# Patient Record
Sex: Female | Born: 1945 | Race: Black or African American | Hispanic: No | Marital: Married | Smoking: Current every day smoker
Health system: Southern US, Community
[De-identification: ages and names within clinical notes are randomized; demographics above are authoritative.]

## PROBLEM LIST (undated history)

## (undated) ENCOUNTER — Emergency Department (HOSPITAL_COMMUNITY): Disposition: A | Discharge status: Home / Self Care | Payer: 59

## (undated) DIAGNOSIS — I10 Essential (primary) hypertension: Secondary | ICD-10-CM

## (undated) DIAGNOSIS — N289 Disorder of kidney and ureter, unspecified: Secondary | ICD-10-CM

## (undated) DIAGNOSIS — R739 Hyperglycemia, unspecified: Secondary | ICD-10-CM

## (undated) DIAGNOSIS — E039 Hypothyroidism, unspecified: Secondary | ICD-10-CM

## (undated) DIAGNOSIS — M4800 Spinal stenosis, site unspecified: Secondary | ICD-10-CM

## (undated) DIAGNOSIS — F419 Anxiety disorder, unspecified: Secondary | ICD-10-CM

## (undated) DIAGNOSIS — K259 Gastric ulcer, unspecified as acute or chronic, without hemorrhage or perforation: Secondary | ICD-10-CM

## (undated) DIAGNOSIS — I639 Cerebral infarction, unspecified: Secondary | ICD-10-CM

## (undated) DIAGNOSIS — K219 Gastro-esophageal reflux disease without esophagitis: Secondary | ICD-10-CM

## (undated) DIAGNOSIS — K279 Peptic ulcer, site unspecified, unspecified as acute or chronic, without hemorrhage or perforation: Secondary | ICD-10-CM

## (undated) DIAGNOSIS — G459 Transient cerebral ischemic attack, unspecified: Secondary | ICD-10-CM

## (undated) DIAGNOSIS — M1990 Unspecified osteoarthritis, unspecified site: Secondary | ICD-10-CM

## (undated) DIAGNOSIS — I2510 Atherosclerotic heart disease of native coronary artery without angina pectoris: Secondary | ICD-10-CM

## (undated) DIAGNOSIS — J449 Chronic obstructive pulmonary disease, unspecified: Secondary | ICD-10-CM

## (undated) DIAGNOSIS — G5600 Carpal tunnel syndrome, unspecified upper limb: Secondary | ICD-10-CM

## (undated) DIAGNOSIS — J189 Pneumonia, unspecified organism: Secondary | ICD-10-CM

## (undated) DIAGNOSIS — E785 Hyperlipidemia, unspecified: Secondary | ICD-10-CM

## (undated) HISTORY — DX: Cerebral infarction, unspecified: I63.9

## (undated) HISTORY — DX: Carpal tunnel syndrome, unspecified upper limb: G56.00

## (undated) HISTORY — PX: CYSTECTOMY: SUR359

## (undated) HISTORY — PX: HEMORRHOID SURGERY: SHX153

## (undated) HISTORY — PX: COLONOSCOPY: SHX174

## (undated) HISTORY — DX: Hyperlipidemia, unspecified: E78.5

## (undated) HISTORY — PX: ABDOMINAL HYSTERECTOMY: SHX81

## (undated) HISTORY — DX: Unspecified osteoarthritis, unspecified site: M19.90

## (undated) HISTORY — DX: Gastric ulcer, unspecified as acute or chronic, without hemorrhage or perforation: K25.9

## (undated) HISTORY — PX: APPENDECTOMY: SHX54

## (undated) HISTORY — PX: CHOLECYSTECTOMY: SHX55

## (undated) HISTORY — DX: Hyperglycemia, unspecified: R73.9

## (undated) HISTORY — DX: Chronic obstructive pulmonary disease, unspecified: J44.9

## (undated) HISTORY — DX: Anxiety disorder, unspecified: F41.9

## (undated) HISTORY — PX: BACK SURGERY: SHX140

## (undated) HISTORY — DX: Peptic ulcer, site unspecified, unspecified as acute or chronic, without hemorrhage or perforation: K27.9

## (undated) HISTORY — DX: Spinal stenosis, site unspecified: M48.00

## (undated) HISTORY — DX: Gastro-esophageal reflux disease without esophagitis: K21.9

## (undated) HISTORY — DX: Disorder of kidney and ureter, unspecified: N28.9

## (undated) HISTORY — DX: Pneumonia, unspecified organism: J18.9

## (undated) HISTORY — DX: Hypothyroidism, unspecified: E03.9

## (undated) HISTORY — PX: JOINT REPLACEMENT: SHX530

---

## 2001-06-07 ENCOUNTER — Emergency Department (HOSPITAL_COMMUNITY): Admission: EM | Admit: 2001-06-07 | Discharge: 2001-06-07 | Payer: Self-pay | Admitting: Internal Medicine

## 2002-02-10 ENCOUNTER — Inpatient Hospital Stay (HOSPITAL_COMMUNITY): Admission: EM | Admit: 2002-02-10 | Discharge: 2002-02-13 | Payer: Self-pay | Admitting: *Deleted

## 2002-02-10 ENCOUNTER — Encounter: Payer: Self-pay | Admitting: Emergency Medicine

## 2002-12-10 ENCOUNTER — Ambulatory Visit (HOSPITAL_COMMUNITY): Admission: RE | Admit: 2002-12-10 | Discharge: 2002-12-10 | Payer: Self-pay | Admitting: Pulmonary Disease

## 2002-12-19 ENCOUNTER — Emergency Department (HOSPITAL_COMMUNITY): Admission: EM | Admit: 2002-12-19 | Discharge: 2002-12-19 | Payer: Self-pay | Admitting: Emergency Medicine

## 2002-12-19 ENCOUNTER — Encounter: Payer: Self-pay | Admitting: Emergency Medicine

## 2002-12-21 ENCOUNTER — Encounter: Payer: Self-pay | Admitting: Emergency Medicine

## 2002-12-21 ENCOUNTER — Emergency Department (HOSPITAL_COMMUNITY): Admission: EM | Admit: 2002-12-21 | Discharge: 2002-12-21 | Payer: Self-pay | Admitting: Emergency Medicine

## 2003-01-05 ENCOUNTER — Ambulatory Visit (HOSPITAL_COMMUNITY): Admission: RE | Admit: 2003-01-05 | Discharge: 2003-01-05 | Payer: Self-pay | Admitting: Internal Medicine

## 2003-01-05 HISTORY — PX: ESOPHAGOGASTRODUODENOSCOPY: SHX1529

## 2004-08-08 ENCOUNTER — Ambulatory Visit: Payer: Self-pay | Admitting: Occupational Therapy

## 2004-10-17 ENCOUNTER — Encounter: Payer: Self-pay | Admitting: Orthopedic Surgery

## 2004-11-16 ENCOUNTER — Encounter: Payer: Self-pay | Admitting: Orthopedic Surgery

## 2004-12-16 ENCOUNTER — Encounter: Payer: Self-pay | Admitting: Orthopedic Surgery

## 2007-07-31 ENCOUNTER — Emergency Department (HOSPITAL_COMMUNITY): Admission: EM | Admit: 2007-07-31 | Discharge: 2007-07-31 | Payer: Self-pay | Admitting: Emergency Medicine

## 2008-03-18 DIAGNOSIS — J189 Pneumonia, unspecified organism: Secondary | ICD-10-CM

## 2008-03-18 DIAGNOSIS — M4800 Spinal stenosis, site unspecified: Secondary | ICD-10-CM

## 2008-03-18 HISTORY — DX: Pneumonia, unspecified organism: J18.9

## 2008-03-18 HISTORY — DX: Spinal stenosis, site unspecified: M48.00

## 2008-10-23 ENCOUNTER — Emergency Department (HOSPITAL_COMMUNITY): Admission: EM | Admit: 2008-10-23 | Discharge: 2008-10-24 | Payer: Self-pay | Admitting: Emergency Medicine

## 2008-10-25 ENCOUNTER — Inpatient Hospital Stay (HOSPITAL_COMMUNITY): Admission: EM | Admit: 2008-10-25 | Discharge: 2008-10-31 | Payer: Self-pay | Admitting: Emergency Medicine

## 2008-10-27 ENCOUNTER — Ambulatory Visit: Payer: Self-pay | Admitting: Internal Medicine

## 2008-10-29 ENCOUNTER — Ambulatory Visit: Payer: Self-pay | Admitting: Internal Medicine

## 2008-11-11 ENCOUNTER — Encounter: Payer: Self-pay | Admitting: Internal Medicine

## 2009-03-22 ENCOUNTER — Emergency Department (HOSPITAL_COMMUNITY): Admission: EM | Admit: 2009-03-22 | Discharge: 2009-03-22 | Payer: Self-pay | Admitting: Emergency Medicine

## 2010-06-23 LAB — POCT CARDIAC MARKERS
CKMB, poc: 1.3 ng/mL (ref 1.0–8.0)
CKMB, poc: 1.4 ng/mL (ref 1.0–8.0)
Myoglobin, poc: 199 ng/mL (ref 12–200)

## 2010-06-23 LAB — DIFFERENTIAL
Basophils Absolute: 0 10*3/uL (ref 0.0–0.1)
Basophils Absolute: 0 10*3/uL (ref 0.0–0.1)
Basophils Relative: 1 % (ref 0–1)
Basophils Relative: 1 % (ref 0–1)
Basophils Relative: 1 % (ref 0–1)
Basophils Relative: 1 % (ref 0–1)
Eosinophils Absolute: 0.1 10*3/uL (ref 0.0–0.7)
Eosinophils Absolute: 0.1 10*3/uL (ref 0.0–0.7)
Eosinophils Absolute: 0.2 10*3/uL (ref 0.0–0.7)
Eosinophils Relative: 1 % (ref 0–5)
Eosinophils Relative: 3 % (ref 0–5)
Eosinophils Relative: 4 % (ref 0–5)
Lymphocytes Relative: 31 % (ref 12–46)
Lymphs Abs: 1.5 10*3/uL (ref 0.7–4.0)
Lymphs Abs: 1.7 10*3/uL (ref 0.7–4.0)
Lymphs Abs: 1.8 10*3/uL (ref 0.7–4.0)
Monocytes Absolute: 0.4 10*3/uL (ref 0.1–1.0)
Monocytes Absolute: 0.5 10*3/uL (ref 0.1–1.0)
Monocytes Relative: 11 % (ref 3–12)
Monocytes Relative: 11 % (ref 3–12)
Monocytes Relative: 7 % (ref 3–12)
Neutro Abs: 2.3 10*3/uL (ref 1.7–7.7)
Neutro Abs: 2.5 10*3/uL (ref 1.7–7.7)
Neutro Abs: 3.2 10*3/uL (ref 1.7–7.7)
Neutrophils Relative %: 46 % (ref 43–77)
Neutrophils Relative %: 47 % (ref 43–77)
Neutrophils Relative %: 65 % (ref 43–77)

## 2010-06-23 LAB — COMPREHENSIVE METABOLIC PANEL
ALT: 13 U/L (ref 0–35)
AST: 15 U/L (ref 0–37)
Albumin: 2.8 g/dL — ABNORMAL LOW (ref 3.5–5.2)
Alkaline Phosphatase: 64 U/L (ref 39–117)
Alkaline Phosphatase: 65 U/L (ref 39–117)
BUN: 11 mg/dL (ref 6–23)
BUN: 2 mg/dL — ABNORMAL LOW (ref 6–23)
CO2: 34 mEq/L — ABNORMAL HIGH (ref 19–32)
Calcium: 9.3 mg/dL (ref 8.4–10.5)
Chloride: 101 mEq/L (ref 96–112)
Chloride: 102 mEq/L (ref 96–112)
Creatinine, Ser: 1.17 mg/dL (ref 0.4–1.2)
GFR calc Af Amer: >60 mL/min (ref >60)
GFR calc non Af Amer: 46 mL/min — ABNORMAL LOW (ref >60)
GFR calc non Af Amer: 51 mL/min — ABNORMAL LOW (ref >60)
Glucose, Bld: 93 mg/dL (ref 70–99)
Glucose, Bld: 99 mg/dL (ref 70–99)
Potassium: 3.1 mEq/L — ABNORMAL LOW (ref 3.5–5.1)
Potassium: 3.3 mEq/L — ABNORMAL LOW (ref 3.5–5.1)
Sodium: 145 mEq/L (ref 135–145)
Sodium: 146 mEq/L — ABNORMAL HIGH (ref 135–145)
Total Bilirubin: 0.5 mg/dL (ref 0.3–1.2)
Total Protein: 5.7 g/dL — ABNORMAL LOW (ref 6.0–8.3)

## 2010-06-23 LAB — PHOSPHORUS: Phosphorus: 3.5 mg/dL (ref 2.3–4.6)

## 2010-06-23 LAB — CBC
HCT: 34.7 % — ABNORMAL LOW (ref 36.0–46.0)
HCT: 37 % (ref 36.0–46.0)
HCT: 38.1 % (ref 36.0–46.0)
HCT: 38.5 % (ref 36.0–46.0)
Hemoglobin: 12.1 g/dL (ref 12.0–15.0)
Hemoglobin: 13.4 g/dL (ref 12.0–15.0)
Hemoglobin: 13.6 g/dL (ref 12.0–15.0)
MCHC: 34.8 g/dL (ref 30.0–36.0)
MCHC: 34.9 g/dL (ref 30.0–36.0)
MCV: 86.7 fL (ref 78.0–100.0)
MCV: 86.8 fL (ref 78.0–100.0)
MCV: 87.4 fL (ref 78.0–100.0)
MCV: 87.4 fL (ref 78.0–100.0)
MCV: 87.9 fL (ref 78.0–100.0)
Platelets: 149 10*3/uL — ABNORMAL LOW (ref 150–400)
Platelets: 153 10*3/uL (ref 150–400)
Platelets: 170 10*3/uL (ref 150–400)
RBC: 3.95 MIL/uL (ref 3.87–5.11)
RBC: 4.1 MIL/uL (ref 3.87–5.11)
RBC: 4.26 MIL/uL (ref 3.87–5.11)
RBC: 4.36 MIL/uL (ref 3.87–5.11)
RDW: 13 % (ref 11.5–15.5)
WBC: 4.1 10*3/uL (ref 4.0–10.5)
WBC: 4.7 10*3/uL (ref 4.0–10.5)
WBC: 4.9 10*3/uL (ref 4.0–10.5)
WBC: 5.3 10*3/uL (ref 4.0–10.5)

## 2010-06-23 LAB — BASIC METABOLIC PANEL
BUN: 11 mg/dL (ref 6–23)
BUN: 12 mg/dL (ref 6–23)
BUN: 4 mg/dL — ABNORMAL LOW (ref 6–23)
BUN: 4 mg/dL — ABNORMAL LOW (ref 6–23)
CO2: 32 mEq/L (ref 19–32)
CO2: 32 mEq/L (ref 19–32)
Calcium: 8.7 mg/dL (ref 8.4–10.5)
Chloride: 104 mEq/L (ref 96–112)
Chloride: 101 mEq/L (ref 96–112)
Chloride: 105 mEq/L (ref 96–112)
Chloride: 106 mEq/L (ref 96–112)
Chloride: 107 mEq/L (ref 96–112)
Creatinine, Ser: 1.11 mg/dL (ref 0.4–1.2)
GFR calc Af Amer: 57 mL/min — ABNORMAL LOW (ref >60)
GFR calc Af Amer: 57 mL/min — ABNORMAL LOW (ref >60)
GFR calc Af Amer: 60 mL/min — ABNORMAL LOW (ref >60)
GFR calc Af Amer: >60 mL/min (ref >60)
GFR calc non Af Amer: 54 mL/min — ABNORMAL LOW (ref >60)
GFR calc non Af Amer: 55 mL/min — ABNORMAL LOW (ref >60)
Potassium: 4.8 mEq/L (ref 3.5–5.1)
Potassium: 2.9 mEq/L — ABNORMAL LOW (ref 3.5–5.1)
Potassium: 3.1 mEq/L — ABNORMAL LOW (ref 3.5–5.1)
Potassium: 3.4 mEq/L — ABNORMAL LOW (ref 3.5–5.1)
Sodium: 137 mEq/L (ref 135–145)
Sodium: 140 mEq/L (ref 135–145)

## 2010-06-23 LAB — LIPASE, BLOOD: Lipase: <10 U/L — ABNORMAL LOW (ref 11–59)

## 2010-06-23 LAB — URINALYSIS, ROUTINE W REFLEX MICROSCOPIC
Glucose, UA: NEGATIVE mg/dL
Glucose, UA: NEGATIVE mg/dL
Hgb urine dipstick: NEGATIVE
Ketones, ur: NEGATIVE mg/dL
Nitrite: NEGATIVE
Specific Gravity, Urine: <1.005 — ABNORMAL LOW (ref 1.005–1.030)
pH: 5.5 (ref 5.0–8.0)
pH: 6 (ref 5.0–8.0)

## 2010-06-23 LAB — TSH
TSH: 0.792 u[IU]/mL (ref 0.350–4.500)
TSH: 2.358 u[IU]/mL (ref 0.350–4.500)

## 2010-06-23 LAB — MAGNESIUM
Magnesium: 1.6 mg/dL (ref 1.5–2.5)
Magnesium: 1.7 mg/dL (ref 1.5–2.5)
Magnesium: 1.8 mg/dL (ref 1.5–2.5)

## 2010-06-23 LAB — T4, FREE: Free T4: 0.84 ng/dL (ref 0.80–1.80)

## 2010-07-31 NOTE — Consult Note (Signed)
NAME:  Connie Ponce, Connie Ponce               ACCOUNT NO.:  [id number]   MEDICAL RECORD NO.:  [id number]          PATIENT TYPE:  INP   LOCATION:  A210                          FACILITY:  [facility]   PHYSICIAN:  R. Roetta Sessions, M.D. DATE OF BIRTH:  [birth year]   DATE OF CONSULTATION:  DATE OF DISCHARGE:                                 CONSULTATION   PHYSICIAN REQUESTING CONSULTATION:  Osvaldo Shipper, M.D., [facility]  [facility].   REASON FOR CONSULTATION:  Abdominal pain, vomiting.   HISTORY OF PRESENT ILLNESS:  [name] is a 65 year old African American  female who presented initially on October 24, 2008, to the ER with  complaints of left-sided abdominal pain.  At that point, she did have a  24-hour period of time with intermittent left flank pain.  It radiated  down into the left lower quadrant.  She underwent a CT of the abdomen  and pelvis for this as well as her nausea and vomiting done without IV  contrast.  There was no evidence of renal stones.  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  Urinalysis was normal except  for moderate bilirubin.  CBC was normal; however, her potassium was 2.9.  She was treated and released.  Because of persisting pain, she came back  to the emergency department the following day for ongoing left-sided  abdominal pain and vomiting.  She states she has never had any pain like  this before.  It has been going on now for about 5 days.  She has  vomiting associated with pain.  She denies any associated diarrhea.  She  was given some MiraLax and Dulcolax yesterday evening.  Has had multiple  stools today, but the pain continues.  She had a CT of the abdomen and  pelvis with contrast on the 10th of August when she presented for the  second time with this abdominal pain.  This showed questionable mild  fatty liver.  She had some fecalization of the small bowel loops in the  pelvis of questionable significance, felt likely related to stasis but  developing partial small bowel obstruction not excluded.  She had some  minimally prominent linear markings at the right lung base within the  right lower lobe, which was likely atelectasis but could not exclude  developing pneumonia.  She subsequently had a followup abdominal film  yesterday that showed no evidence of obstruction and no evidence of  significant fecal load.  Her labs remain unremarkable.  Her TSH and T4  are unremarkable.  Lipase and amylase normal.  CBC unremarkable.  LFTs  normal except albumin of 2.8.   She is a little bit drowsy from pain medicine just given to her.  She  denies any chronic abdominal pain or chronic constipation.  She denies  any problems with heartburn, dysphagia, odynophagia, weight loss, melena  or rectal bleeding.  She states she had a colonoscopy about 5 years ago  at [facility].  It was normal.  She states she has a history of  ulcers.  The only EGD I can see in  the EMR is from October 2004, and she  had  some tiny antral erosions, a small hiatal hernia only.   PAST MEDICAL HISTORY:  Hypertension, history of gastric ulcer,  dyslipidemia, chronic back pain with prior back surgery.  She has had a  hysterectomy, a cholecystectomy.  She had a cyst in her rectum, status  post surgery ?, dyslipidemia, hypothyroidism.  EGD as outlined above.  History of colonoscopy as outlined above.   FAMILY HISTORY:  No known family history of colorectal cancer, liver  disease or chronic GI illnesses.  Mother lived to be over 23.   SOCIAL HISTORY:  She has been married for 25 years.  She has 2 daughters  and 1 son.  She smokes 1/2 pack of cigarettes daily for over 40 years.  Denies alcohol use.   ALLERGIES:  NO KNOWN DRUG ALLERGIES.   REVIEW OF SYSTEMS:  See HPI for GI and constitutional.  CARDIOPULMONARY:  Denies chest pain, shortness of breath.  GENITOURINARY:  Denies dysuria,  hematuria.   PHYSICAL EXAMINATION:  VITAL SIGNS:  Temperature 98.8, pulse  72,  respirations 20, blood pressure 197/94, O2 sat 98% on 2 L per minute  nasal cannula.  She has not been able to keep any liquids down today.  She states she has had a couple of stools in the last 24 hours.  GENERAL:  Pleasant __________ female who appears uncomfortable.  She  appears younger than her stated age.  She is obese.  She falls asleep  quickly during evaluation.  Recent pain medication given.  SKIN:  Warm and dry.  No jaundice.  HEENT:  Sclerae are nonicteric.  Oropharyngeal mucosa moist and pink.  No lymphadenopathy.  CHEST:  Lungs clear to auscultation.  CARDIAC:  Exam reveals regular rate and rhythm.  No murmurs.  ABDOMEN:  Obese.  Positive bowel sounds.  The abdomen is soft.  No  guarding or rebound.  Nontender on exam currently given her pain recent  pain medication.  No organomegaly or masses appreciated.  No abdominal  bruits or hernias.  EXTREMITIES:  Lower extremities with no edema.   Labs as mentioned above.  X-rays as mentioned above.   IMPRESSION:  [name] is a 65 year old lady who presents with acute-  onset left-sided abdominal pain associated with nausea, vomiting.  Symptoms have been present now for about 5 days.  CT x2 and plain  abdominal films really unremarkable as far as a source for her symptoms.  Labs unremarkable as well.  She really denies any significant  constipation preceding these symptoms and has had no relief of her  symptoms after multiple BMs in the last 24 hours.  Dr. Jena Gauss has  reviewed the films with Dr. Lynwood Dawley, the radiologist, today.  Her  symptoms are not classical for peptic ulcer disease, IBS.  Really does  not fit a small bowel obstruction at this point.  Would question the  possibility of a urological process such as Dietl's crisis from  intermittent ureteral obstruction as the source of her flank pain and  vomiting.   PLAN:  1. Dr. Jena Gauss to evaluate the patient and make further recommendations.      She may need to have  a renal consult.  2. Supportive measures.  Would advise Zofran 4 mg IV every 4 hours for      the next 24 hours and then to use on a      p.r.n. basis.  Consider increasing IV fluids if she is not  able to      keep any p.o. down at this point.  3. PPI therapy as you are.  4. Further recommendations to follow.      Tana Coast, P.AJonathon Bellows, M.D.  Electronically Signed    LL/MEDQ  D:  10/27/2008  T:  10/27/2008  Job:  [id number]   cc:   Ninfa Linden, NP  [facility]   Osvaldo Shipper, MD

## 2010-07-31 NOTE — Discharge Summary (Signed)
NAMEASTIN, RAPE               ACCOUNT NO.:  [id number]   MEDICAL RECORD NO.:  [id number]          PATIENT TYPE:  INP   LOCATION:  A210                          FACILITY:  [facility]   PHYSICIAN:  Osvaldo Shipper, MD     DATE OF BIRTH:  [birth year]   DATE OF ADMISSION:  10/25/2008  DATE OF DISCHARGE:  08/16/2010LH                               DISCHARGE SUMMARY   PRIMARY CARE PHYSICIAN:  Ninfa Linden, FNP, [place], [place].   CONSULTATIONS:  During this hospitalization the patient was seen by the  following consultants:  1. Gastroenterology.  2. Urology, Dr. Jerre Simon.  3. General surgeon, Dr Franky Macho.   PROCEDURES:  None.   IMAGING STUDIES:  1. CT scan of the abdomen and pelvis which revealed fatty infiltration      of the liver and some fecalization of the small bowel loops,      otherwise unremarkable.  2. Chest x-ray showed questionable nodule in the medial right upper      lung new versus bone density.  3. Abdominal film showed benign bowel gas pattern.  4. Ultrasound of kidneys was negative.  5. MRI of the L-spine showed (1) moderate central canal stenosis at L3-      4. (2) Moderate to severe bilateral foraminal narrowing at L3-4,      left worse than right, status post laminectomy at L4.  Mild right      foraminal narrowing at L4-.  (3) Mild residual central canal      narrowing at L4-5, leftward disk bulging at L5-S1 without      significant stenosis and mild bilateral foraminal narrowing and      left lateral recess narrowing at L2-3.   DISCHARGE DIAGNOSES:  1. Back and abdominal pain thought to be secondary to lumbar      radiculopathy, significantly improved.  2. Pneumonia status post treatment, 2 more days left actually.  3. Hypokalemia, improved.  4. History of hypertension and hypothyroidism, stable.   BRIEF HOSPITAL COURSE:  Briefly, this is a 65 year old African American  female who presented to the hospital with complaints of left flank  pain.  The patient underwent a CT scan with and without contrast which did not  reveal any kidney stones.  Because the patient is having significant  discomfort, we consulted GI.  GI felt that there was no  gastroenterological reason for her abdominal discomfort.  However, they  did recommend a good bowel regimen because of constipation.  Because the  patient's pain was not improving and it was left in the left flank,  initially we consulted urology and Dr. Jerre Simon saw the patient and he  also felt that this was not urological.  In the meantime, the patient's  pain migrated to the lower back and to the abdomen.  The patient was  having significant discomfort and was having nausea, vomiting, along  with this pain.  Once the pain started moving, I also consulted Dr.  Lovell Sheehan, and he also felt this was not a surgical issue.  Finally,  because the patient has chronic back pain we  obtained an MRI, the  results of which were discussed above.  We feel that this pain could be  radiculopathy from her significant lumbar disk disease.  We started her  on Neurontin and Ultram, and her pain has significantly improved as of  today.  She has been able to ambulate with no difficulties.  She is  tolerating p.o. intake.  Nausea and vomiting have subsided, so I think  she is at this time stable to go back home.  I have asked her to discuss  further management with the PMD.  If her pain does not improve, she will  probably need to be seen by a back surgeon.   I have also told her to avoid constipation, and I put her on MiraLax 17  grams daily while she is on these narcotic agents.   On the day of discharge, the patient is feeling well, much improved,  still requiring pain medicines occasionally but is able to ambulate, is  not throwing up, is tolerating p.o. intake, is keen on going home.  Vital signs show that her blood pressure is 136/75.  The rest of the  vital signs are all stable.  Potassium level this  morning is 4.82.  The  rest of the labs are unremarkable.   She is okay for discharge.   DISCHARGE MEDICATIONS:  1. Gabapentin 300 mg b.i.d.  2. Ultram 50 mg t.i.d. for 7 days and then as needed.  3. Avelox 400 mg once daily for 2 more days.  4. Prednisone 60 mg daily for 3 days followed by 40 mg daily for 3      days followed by 20 mg daily for 3 days followed by 10 mg daily for      3 days.  5. MiraLax 17 grams once daily.  Hold if diarrhea.   Otherwise she may continue with her regular home medicines which  include:  1. Clonazepam 0.5 mg b.i.d.  2. Vitamin D 5000 units as before.  3. Metoprolol 25 mg daily.  4. Lisinopril 40 mg daily.  5. Trazodone 150 mg at bedtime.  6. Levoxyl 200 mcg daily.  7. Triamterene-HCTZ 75/50 once daily.  8. Simvastatin 80 mg daily.  9. Omeprazole 40 mg daily.  10.Diltiazem CD 360 mg daily.  11.Oxycodone/acetaminophen t.i.d. as needed.  Please hold this while      taking other pain medications.  12.Etodolac 400 mg daily.   FOLLOW UP:  Follow up with Ninfa Linden, FNP in 7-10 days.   DIET:  Heart-healthy.   PHYSICAL ACTIVITY:  As tolerated.   Total time of this encounter 35 minutes.      Osvaldo Shipper, MD  Electronically Signed     GK/MEDQ  D:  10/31/2008  T:  10/31/2008  Job:  [id number]   cc:   Ninfa Linden, FNP

## 2010-07-31 NOTE — H&P (Signed)
Connie Ponce, Connie Ponce               ACCOUNT NO.:  [id number]   MEDICAL RECORD NO.:  [id number]          PATIENT TYPE:  INP   LOCATION:  A326                          FACILITY:  [facility]   PHYSICIAN:  Margaretmary Dys, M.D.DATE OF BIRTH:  07-25-1945   DATE OF ADMISSION:  10/25/2008  DATE OF DISCHARGE:  LH                              HISTORY & PHYSICAL   ADMISSION DIAGNOSES:  1. Acute left flank abdominal pain.  2. Probable early right lobar pneumonia.  3. Irritable bowel syndrome.  4. Morbid obesity.  5. Moderate dehydration.   CHIEF COMPLAINTS:  Left flank abdominal pain.   HISTORY OF PRESENT ILLNESS:  Connie Ponce is a 65 year old female who  presented to the emergency room complaining of some pain in her left  flank.  The patient has had a negative CT scan done about a week ago,  but returns for persistent pain.  The patient had a CT scan today which  was essentially negative but did show some evidence of early lobar  pneumonia.  The patient reports the pain as 10/10 at its worst.  It  radiates from the left flank down into her lower quadrant.  The  patient's pain is not aggravated by anything and does not have any  precipitating factors.  She did have some nausea and had an episode of  vomiting yesterday.  She denies any fever or chills.  No cough other  than her baseline cough from her COPD and no diarrhea.  The patient  reports her last bowel movement was Friday last week.  The patient  apparently had been evaluated in the past with multiple endoscopies done  which only suggest the possibility of irritable bowel syndrome for which  she was told.  The patient's exam today was really unremarkable with no  significant abnormalities.  The patient has had 2 CT scans in the last 3  days of her abdomen and pelvis which did not reveal any acute  abnormalities.  The patient has had colonoscopies in the past as  mentioned above.   REVIEW OF SYSTEMS:  As mentioned in history of present  illness above.   PAST MEDICAL HISTORY:  1. Hypertension.  2. History of gastric ulcer.  3. Dyslipidemia.  4. Status post cholecystectomy.  5. Hysterectomy.  6. Back surgery.  7. History of cyst in the rectum, status post surgery.  8. History of dyslipidemia.  9. Hypothyroidism.   MEDICATIONS:  1. Clonazepam 0.5 mg p.o. b.i.d.  2. Vitamin B 50,000 units specialized dosing.  3. Metoprolol 25 mg p.o. once a day.  4. Lisinopril 40 mg p.o. daily.  5. Trazodone 150 mg p.o. at bedtime.  6. Levoxyl 200 mcg p.o. once a day.  7. Triamterene hydrochlorothiazide 75/50 one p.o. daily.  8. Simvastatin 80 mg p.o. daily.  9. Diltiazem 360 mg p.o. once a day.  10.Oxycodone/acetaminophen 5/325 mg p.o. t.i.d.  11.Etodolac 400 mg p.o. once a day.   ALLERGIES:  No known drug allergies.   FAMILY HISTORY:  Noncontributory.   SOCIAL HISTORY:  The patient is single, lives at home.  Has 4 children.  She continues to smoke about 1 pack of cigarettes a day.  She is on  disability due to chronic back problems.  She denies any alcohol or  illicit drug use.   PHYSICAL EXAMINATION:  GENERAL:  The patient was conscious, alert,  comfortable, not in acute distress, well oriented to time, place, and  person.  Does not appear to be in any significant pain distress.  VITAL SIGNS:  Blood pressure 175/80, pulse of 71, respiratory rate 24,  temperature 98.1 degrees Fahrenheit, oxygen saturation was 94% on 2 L.  HEENT:  Normocephalic, atraumatic.  Oral mucosa was dry.  No exudates  were noted.  NECK:  Supple.  No jugular venous distention.  LUNGS:  Occasional wheezing with crackles mostly in the right base.  HEART:  Regular rate and rhythm.  No gallops or rubs.  ABDOMEN:  Soft, nontender.  Bowel sounds positive.  No masses palpable.  EXTREMITIES:  No edema.  No calf induration or tenderness was noted.  CNS:  Grossly intact with no focal neurologic deficits.   LABORATORY/DIAGNOSTIC DATA:  White blood cell  count was 5.3, hemoglobin  of 13.6, hematocrit 38.5, platelet count was 161 with no left shift.  Sodium is 139, potassium is 3.3, chloride of 101, CO2 was 31, glucose  93, BUN of 11, creatinine was 1.1, AST is 20, ALT of 18.  Urinalysis was  really unremarkable.  Initial cardiac markers were negative.  Chest x-  ray shows nodule in the right upper lung field versus bony density.  I  have a CT scan of the abdomen and pelvis that shows no acute abnormality  and also likely more stasis, possible partial small bowel obstruction.  Also questionable infiltrate in the right lung.   ASSESSMENT AND PLAN:  1. Connie Ponce is a 65 year old female who presents with left flank pain      whose blood work really is unremarkable.  Her urinalysis is also      unremarkable with no evidence of blood or any other abnormalities      to suggest kidney stones or urinary tract infection or      pyelonephritis.  The patient may have a recurrence of her irritable      bowel syndrome.   PLAN:  Admit the patient to the medical floor.  1. Will control pain with Dilaudid as needed.  2. Will rehydrate the patient with IV Fleet's normal saline.  Will      correct her potassium by adding potassium to the fluids.  3. Will place the patient on a clear liquid diet for now.  4. Will put on Avelox 400 mg IV once a day.  5. The patient will be on albuterol and Atrovent nebulizers.  6. Resume all her home medications at this time.  7. Abdominal pain.  Will request gastroenterology to see her as she      may require a colonoscopy.  The patient does not remember her last      colonoscopy.  8. Overall the patient is stable at this time.  The patient does not      have an acute abdominal pathology that required immediate surgical      exploration.  Will also put empirically on antibiotics as above.  I      explained the above plan to the patient in detail who verbalizes      full understanding.  She is a full code.       Margaretmary Dys, M.D.  Electronically Signed  AM/MEDQ  D:  10/25/2008  T:  10/26/2008  Job:  [id number]

## 2010-08-03 NOTE — H&P (Signed)
NAME:  Connie Ponce, Connie Ponce                         ACCOUNT NO.:  [id number]   MEDICAL RECORD NO.:  [id number]                   PATIENT TYPE:   LOCATION:                                       FACILITY:  [facility]   PHYSICIAN:  R. Roetta Sessions, M.D.              DATE OF BIRTH:  03-11-1946   DATE OF ADMISSION:  DATE OF DISCHARGE:                                HISTORY & PHYSICAL   CHIEF COMPLAINT:  Right upper quadrant pain x1 month.   HISTORY OF PRESENT ILLNESS:  [name] is a 65 year old overweight  African American female who presents complaining of constant hard right  upper quadrant pain that radiates into her back. She was evaluated by the  emergency room physician on December 22, 2002, for the same problem. At that  point in time, CT scan of the abdomen with contrast was performed which was  unremarkable. She also had laboratory studies obtained with lipase 33 and  amylase 62. LFTs were all within normal limits, as well as metabolic panel,  and CBC. She did have an urinalysis obtained which showed many squamous  epithelium, 7-10 WBCs, 0-2 RBCs, and few bacteria. She denies being treated  for an urinary tract infection, and this possibly could have been a  contaminated specimen. She had an acute abdominal series obtained as well on  October 3, which did not reveal any acute abnormalities. She states that the  nausea and vomiting have resolved; however, she continues to have diarrhea  approximately 3-4 times a day. She denies any melena or hematochezia.   She denies any NSAID use or aspirin use; however, she is taking Percocet 2-3  a day for her pain which is 10/10 on the pain scale. Percocet resolved the  pain to 7/10. She notices the pain increases with consumption of food. She  is taking methadone for chronic pain since she had back surgery in 1995,  which disabled her. She does have history of peptic ulcer disease with upper  GI bleed approximately 4-5 years ago, and she was  hospitalized at [facility]. The patient is also status post cholecystectomy three years ago.   CURRENT MEDICATIONS:  1. Prevacid 30 mg daily.  2. Roxicet 5/325 mg as needed for severe pain.  3. Methadone 10 mg p.r.n. pain.  4. Lipitor 40 mg daily.  5. Levoxyl 200 mcg daily.  6. Kay Ciel 20 mEq b.i.d.  7. Triamterene hydrochlorothiazide 75/50 mg daily.  8. Aggrenox b.i.d.  9. Zyrtec 10 mg daily.  10.      Flexeril 10 mg p.r.n.  11.      Ambien 10 mg at bedtime p.r.n. insomnia.   PAST MEDICAL HISTORY:  1. Chronic back pain.  2. Hypertension.  3. Hypercholesterolemia.  4. Hypothyroidism.   PAST SURGICAL HISTORY:  1. Back surgery in 1995.  2. Hysterectomy.  3. Cholecystectomy three years ago.  4. Rectal cyst  I&D twice.   ALLERGIES:  No known drug allergies.   FAMILY HISTORY:  No known family history of colorectal carcinoma, liver, or  chronic GI problems. Mother is alive at age 58 in good health. She does not  have father's history. She has two sisters and one brother all of which were  healthy.   SOCIAL HISTORY:  She has been married x21 years. She has two daughters and  one son who are all healthy except for diabetes mellitus. She is currently  disabled secondary to her chronic back problems. She currently smokes 1/2-1  pack per day for the past 40 years. She denies any alcohol or drug use.   REVIEW OF SYSTEMS:  CONSTITUTIONAL:  She reports her weight is down  approximately six pounds in the past month. She also reports decreased  appetite. CARDIOVASCULAR:  She denies any chest pain or palpitations.  PULMONARY:  She denies any shortness of breath or dyspnea or cough. SKIN:  She denies any rash or jaundice. GENITOURINARY:  She denies any dysuria,  hematuria, or increased urinary frequency. GASTROINTESTINAL:  See HPI.   PHYSICAL EXAMINATION:  VITAL SIGNS:  Weight 222.5 pounds, height 64 inches,  temperature 97.8, blood pressure 128/70, pulse 70.  GENERAL:  Ms.  Connie Ponce is a 65 year old obese African American female  in no acute distress. She is pleasant and cooperative, alert and oriented.  HEENT:  Sclerae clear, nonicteric. Conjunctivae pink.  SKIN:  Brown, warm, and dry without any rash or jaundice.  NECK:  Supple without mass or thyromegaly. No JVD.  CHEST:  Heart regular rate and rhythm without murmurs, clicks, rubs, or  gallops.  LUNGS:  With emphysematous changes throughout. Otherwise, good respirations.  ABDOMEN:  Rounded, nondistended, positive bowel sounds x4. There is mild to  moderate right upper quadrant tenderness on palpation. No palpable masses or  organomegaly; however, this is limited due to patient's body habitus.  EXTREMITIES:  No pedal edema.   LABORATORY DATA:  See HPI.   ASSESSMENT:  1. [name] is a 65 year old African American female with     persistent right upper quadrant pain. I do not see any clinical evidence     of pancreatitis given the previous workup per the emergency room. She     does have history of peptic ulcer disease with gastrointestinal bleed     approximately 4-5 years ago. I do feel peptic ulcer disease is definitely     a possibility of what could be causing her pain. Last     esophagogastroduodenoscopy was by Dr. Jena Gauss in 2000, which revealed a     small submucosal extrinsic compression of the proximal esophagus of     doubtful significant and, otherwise, normal. She also underwent normal     colonoscopy in 2000, except for internal hemorrhoids.  I would like to     repeat the esophagogastroduodenoscopy by Dr. Jena Gauss as soon as possible to     further evaluate this. Another possibility is sphincter of Oddi     dysfunction as she is status post cholecystectomy.  If     esophagogastroduodenoscopy is negative, we may proceed with biliary     manometry per Dr. Luvenia Starch recommendation. 2. Abnormal urinalysis per emergency room record:  Urinalysis was positive     for urinary tract  infection; however, I do not know if this is a clean     specimen. I will repeat urinalysis today to rule out urinary tract     infection.  RECOMMENDATIONS:  1. Urinalysis with urine culture, if needed.  2. She is to quit smoking.  3. Will schedule EGD with Dr. Jena Gauss. She is to stop her Aggrenox three days     prior to the EGD. I discussed this procedure with Connie Ponce with the     risks and benefits to include but not limited to bleeding or infection.     She agreed with this plan. Consent will be obtained.  4. She should continue her current pain medications.  5. She should go immediately to the ER if she develops severe pain.  6. We will follow up pending endoscopy results.     _____________________________________  ___________________________________________  Nicholas Lose, N.P.               Jonathon Bellows, M.D.   KC/MEDQ  D:  12/29/2002  T:  12/29/2002  Job:  [id number]

## 2010-08-03 NOTE — Op Note (Signed)
NAME:  Connie Ponce, Connie Ponce                         ACCOUNT NO.:  [id number]   MEDICAL RECORD NO.:  [id number]                   PATIENT TYPE:  AMB   LOCATION:  DAY                                  FACILITY:  [facility]   PHYSICIAN:  R. Roetta Sessions, M.D.              DATE OF BIRTH:  [birth year]   DATE OF PROCEDURE:  01/05/2003  DATE OF DISCHARGE:                                 OPERATIVE REPORT   PROCEDURE:  Esophagogastroduodenoscopy with biopsy.   ENDOSCOPIST:  Gerrit Friends. Rourk, M.D.   INDICATIONS FOR PROCEDURE:  The patient is a 65 year old lady with right  upper quadrant abdominal pain.  There is some component of radiation around  from the right flank.  She has been fairly extensively evaluated already.  She is status post cholecystectomy.  LFTs are normal.  CT of the abdomen is  negative.  She has significant chronic back pain for which her medications  include Roxicet and methadone. She has a history of peptic ulcer disease.  EGD is now being done to further evaluate her right upper quadrant pain.  This approach has been discussed with the patient at length.  The potential  risks, benefits, and alternatives have been reviewed; questions answered.  She is agreeable.  Please see my dictated H&P of December 29, 2002 for more  information.   PROCEDURE NOTE:  O2 saturation, blood pressure, pulse and respirations were  monitored throughout the entire procedure.  Conscious sedation: Versed 3 mg  IV, Demerol 75 mg IV in divided doses.   INSTRUMENT:  Olympus video chip adult gastroscope.   FINDINGS:  Examination of the tubular esophagus revealed no mucosal  abnormalities.  The EG junction was easily traversed.   STOMACH:  The gastric cavity was empty.  It insufflated well with air.  A  thorough examination of the gastric mucosa including a retroflex view of the  proximal stomach and esophagogastric junction demonstrated a small hiatal  hernia and a couple of tiny antral erosions. Otherwise  the mucosa appeared  normal.  The pylorus was patent and easily traversed.   DUODENUM:  The bulb and the second portion appeared normal.   THERAPEUTIC/DIAGNOSTIC MANEUVERS:  None.   The patient tolerated the procedure well and was reacted in endoscopy.   IMPRESSION:  1. Normal esophagus, small hiatal hernia.  2. A couple of tiny antral erosions, otherwise normal stomach normal D1 and     D2.   Today's findings do not explain the patient's symptoms which may end up  being radicular in origin or perhaps of sphincter of Oddi dysfunction.   RECOMMENDATIONS:  1. Continue Prevacid 30 mg orally daily.  2. Will try some NuLev tablets, 1 on the tongue before meals and at bedtime     as needed for abdominal pain.  Will see this nice lady back in 1 month     and we will see how she is doing  and will make further recommendations at     followup.      ___________________________________________                                            Jonathon Bellows, M.D.   RMR/MEDQ  D:  01/05/2003  T:  01/05/2003  Job:  [id number]

## 2010-08-03 NOTE — H&P (Signed)
NAME:  Connie Ponce, Connie Ponce                         ACCOUNT NO.:  [id number]   MEDICAL RECORD NO.:  [id number]                   PATIENT TYPE:  INP   LOCATION:  A211                                 FACILITY:  [facility]   PHYSICIAN:  Sarita Bottom, M.D.                  DATE OF BIRTH:  Sep 13, 1945   DATE OF ADMISSION:  02/10/2002  DATE OF DISCHARGE:                                HISTORY & PHYSICAL   PRIMARY CARE PHYSICIAN:  Dr. Randall An of __________ [facility] in  [place].   CHIEF COMPLAINT:  I have pain in my chest.   HISTORY OF PRESENT ILLNESS:  The patient is a 65 year old lady with a  history of hypertension, hyperthyroidism.  She was apparently well until  about a week ago when she noted chest pain.  Describes the pain as  intermittent.  It sometimes is dull.  Sometimes the pain is sharp and pain  sometimes radiates to her shoulder, sometimes associated with nausea and  shortness of breath. She decided to come to the emergency room because of  the pain for evaluation.   REVIEW OF SYSTEMS:  GENERAL:  She denies any weight loss or fever.  RESPIRATORY:  She admits to shortness of breath on and off but denies any  cough.  CARDIOVASCULAR:  Complains of chest pain and palpitations.  GI:  Denies any nausea, vomiting, or diarrhea.  GENITOURINARY:  Denies any  dysuria.  CENTRAL NERVOUS SYSTEM:  Denies any dizziness.   PAST MEDICAL HISTORY:  1. Hypertension.  2. Hypothyroidism.  3. Hyperlipidemia.   MEDICATIONS:  1. Hydrochlorothiazide 25 mg t.i.d.  2. Lipitor.  3. Synthroid she is not sure about her dose.   ALLERGIES:  She has no known drug allergies.   FAMILY HISTORY:  Significant for CVA in her mother.   SOCIAL HISTORY:  She is married with three children.  She smokes about 1/2  pack of cigarettes per day for 25 years.  She drinks alcohol occasionally.   PHYSICAL EXAMINATION:  VITAL SIGNS:  BP 145/90 with a heart rate of 87.  GENERAL:  This is a middle-aged lady not in  any apparent distress.  She is  obese.  HEENT:  She is not pale.  Pupils equal and reactive to light and  accommodation.  She is anicteric.  NECK:  Supple.  There is no thyromegaly, no jugular venous distention.  CHEST:  Air entry is good bilaterally.  No wheezes or crackles were heard.  CARDIOVASCULAR:  Heart sounds S1 & S2 regular rhythm and rate.  No murmurs  were heard.  ABDOMEN:  Shows a surgical scar.  Abdomen is soft, bowel sounds are present.  No masses or organomegaly.  CENTRAL NERVOUS SYSTEM:  She is alert and oriented x3.  No focal deficits  were seen.  EXTREMITIES:  She has no edema.   LABORATORY AND ACCESSORY DATA:  Her labs show a  troponin of 0.01, CK of 20  and 22, MB fraction of 3.7.  Sodium 138, potassium 3.7, chloride 104, CO2  28, BUN 21, creatinine 1.4, glucose 84, calcium 10.2.  WBC is 4.9,  hemoglobin 14.9, hematocrit 44.0, MCV 86.1, platelet is 240, normal  differential.   A chest x-ray was pending at the time.  Her EKG shows a sinus rhythm at 83  beats per minute for a normal electrocardiogram, normal interval, no acute  ST-T wave changes.   ASSESSMENT:  1. Intermittent chest pain probably due to unstable angina.  The patient     will be admitted to telemetry.  She will have serial cardiac enzymes and     EKG done to rule out acute myocardial infarction.  The patient will be     started on Lovenox 1 mg per kg q.12h.  She will be given nitroglycerin     0.4 mg sublingually p.r.n. for chest pain.  She will be started on     aspirin 325 mg p.o. q.d. and she will be given metoprolol 25 mg b.i.d.  A     cardiology consult will be called for further evaluation.  2. Hypertension.  The patient will be put on a 2 gram sodium diet.  She will     be continued on hydrochlorothiazide 25 mg p.o. q.d.  3. Hyperlipidemia.  The patient will be continued on her Lipitor 20 mg p.o.     q.d.  4. Hypothyroidism.  The patient's TSH will be monitored.  The patient will     be  started on Synthroid 100 mcg p.o. q.d.  The patient's husband to bring     the correct doses of her Synthroid which after that we will put the     patient back on her regular dosage.  5. Further management and plans will depend on the patient's clinical     course.                                               Sarita Bottom, M.D.    DW/MEDQ  D:  02/11/2002  T:  02/11/2002  Job:  [id number]

## 2010-08-03 NOTE — Discharge Summary (Signed)
   NAME:  FELICIA, BLOOMQUIST                         ACCOUNT NO.:  [id number]   MEDICAL RECORD NO.:  [id number]                   PATIENT TYPE:  INP   LOCATION:  A211                                 FACILITY:  [facility]   PHYSICIAN:  Hanley Hays. Dechurch, M.D.           DATE OF BIRTH:  09/05/1945   DATE OF ADMISSION:  02/10/2002  DATE OF DISCHARGE:  02/12/2002                                 DISCHARGE SUMMARY   DIAGNOSES:  1. Chest pain.  2. Hypertension.  3. History of cervical laminectomy with a resultant right extremity pain.  4. Hypothyroidism.  5. Hyperlipidemia by report.   DISPOSITION:  Patient transferred to [facility] for cardiac catheterization.   MEDICATIONS AT TIME OF TRANSFER:  1. Lovenox 100 q.12h.  2. Lopressor 25 b.i.d.  3. Aspirin 325 daily.  4. Hydrochlorothiazide 25 daily.  5. Synthroid 100 q.d.  6. Simvastatin 40 q.d.  7. Norvasc 5 q.d. (added November 27)  8. Protonix 40 q.d.  9. Nitroglycerin p.r.n.   CONDITION:  Stable.   HOSPITAL COURSE:  The patient is a 65 year old African-American female who  presents with intermittent chest pain with associated nausea and shortness  of breath over the last one to two weeks which has increased.  Her risk  factors include tobacco abuse and hypertension.  She was evaluated by  Cardiology who felt that LV catheterization was warranted.  Arrangements  have been made and she is being transferred to [facility] for the  procedure.    PHYSICAL EXAMINATION:  Please see the dictated H&P as there have been no  changes.  Her blood pressure in the hospital remained in the 140s; however,  after addition of Norvasc in 110s to 120s.  She is tolerating this well.  She feels well at the time of discharge and fully understands the plan.                                               Hanley Hays Josefine Class, M.D.    FED/MEDQ  D:  02/12/2002  T:  02/12/2002  Job:  [id number]   cc:   Esau Grew  [address]  [place]  [place]  [address]  Fax: [phone]

## 2010-08-03 NOTE — Discharge Summary (Signed)
NAME:  Connie Ponce, Connie Ponce                         ACCOUNT NO.:  [id number]   MEDICAL RECORD NO.:  [id number]                   PATIENT TYPE:  INP   LOCATION:  4703                                 FACILITY:  [facility]   PHYSICIAN:  Joellyn Rued, P.A. [facility]              DATE OF BIRTH:  11-18-1945   DATE OF ADMISSION:  02/12/2002  DATE OF DISCHARGE:                           DISCHARGE SUMMARY - REFERRING   HISTORY OF PRESENT ILLNESS:  The patient is a 65 year old black female who  presented to [facility] with chest discomfort. She states in the  past two  to three weeks she has developed this new midsternal chest  discomfort with or without activity, and she thinks it is slightly worse  with exertion. She describes it as dull with occasional radiation to her  right upper extremity and shortness of breath. She also has noticed  increasing dyspnea on exertion over the same time frame. She feels that her  symptoms do not resemble her prior burning sensation which she attributes to  reflux and has been treated with excellent results with Protonix.   PAST MEDICAL HISTORY:  Her medical history is notable for:  1. Hypertension.  2. Hyperlipidemia.  3. Tobacco use.  4. Family history.  5. Obesity.   LABORATORY DATA:  At [facility], CKs and troponins were negative  for myocardial infarction x 3.  Hemoglobin 14.9 and hematocrit 44.0,  platelets 240, WBCs 4.9. Sodium 138, potassium 3.7, BUN 21, creatinine 1.4,  glucose 84. TSH was slightly elevated at 7.145.   An EKG showed normal sinus rhythm, borderline left axis deviation, early  repolarization.  A chest x-ray did not show any active disease.   HOSPITAL COURSE:  The patient was transferred to [facility] for  further evaluation of possible coronary artery disease given her multiple  cardiac risk factors and her concerning symptoms. On February 12, 2002, Dr.  Chales Abrahams performed left heart catheterization without difficulty.  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 The  RCA was dominant with a 30% proximal lesion. The LAD also had some luminal  irregularities in the proximal midportion, but was essentially free from  disease. Her ejection fraction was 55% with LV pressure 120/10 and aortic  pressure 120/60. Dr. Chales Abrahams felt that her chest discomfort and her dyspnea on  exertion were not related to coronary artery disease or  LV dysfunction, and  that post her bed rest she could be discharged to home with medical  management and cardiac risk factor modification.   DISCHARGE DIAGNOSES:  1. Noncardiac chest discomfort, nonobstructive coronary artery disease as     previously described.  2. Dyspnea on exertion, unknown etiology.  3. Tobacco abuse.  4. Multiple cardiac risk factors as included above.   DISPOSITION:  She is discharged to home.   DISCHARGE MEDICATIONS:  She is asked to continue on  her home medications.  These include:  1. Levoxyl 0.2 mg q.d.  2. Prevacid 30 q.d.  3. Lipitor 40 q.h.s.  4. Aggrenox 200/25 b.i.d.  5. Triamterene/hydrochlorothiazide 75/50 q.d.  6. Enteric coated aspirin 325 q.d.   DISCHARGE INSTRUCTIONS:  She was advised no lifting, driving, sexual  activity or heavy exertion for two days. Maintain a low fat, low salt, low  cholesterol diet. If she has any problems with her catheterization site, she  was asked to call our office. She was advised no smoking or  tobacco  products.   FOLLOW UP:  She was asked to arrange a one to two week appointment with Dr.  Randall An for further evaluation of her chest discomfort and dyspnea on  exertion. She was also asked to arrange followup with her cardiologist, Dr.  Lita Mains, in [place], [place], as well. Dr. Randall An and Dr. Lita Mains will  encourage cardiac risk factor modification and will also reevaluate her  slightly elevated TSH. She was asked to bring all medications to all   appointments.                                                Joellyn Rued, P.A. [facility]    EW/MEDQ  D:  02/12/2002  T:  02/12/2002  Job:  [id number]   cc:   Duffy Bruce  [facility] [place]  [place] [address]  Fax: [phone]   Dr. Margaretmary Lombard, [place]

## 2010-08-03 NOTE — Cardiovascular Report (Signed)
NAME:  Connie Ponce, Connie Ponce                         ACCOUNT NO.:  [id number]   MEDICAL RECORD NO.:  [id number]                   PATIENT TYPE:  INP   LOCATION:  4703                                 FACILITY:  [facility]   PHYSICIAN:  Veneda Melter, M.D. [facility]               DATE OF BIRTH:  1945/06/01   DATE OF PROCEDURE:  02/12/2002  DATE OF DISCHARGE:                              CARDIAC CATHETERIZATION   PROCEDURE:  1. Left heart catheterization.  2. Left ventriculogram.  3. Selective coronary angiography.  4. Perclose of right femoral artery.   DIAGNOSES:  1. Mild coronary artery disease of antrum.  2. Normal left ventricular systolic function.   INDICATIONS FOR PROCEDURE:  The patient is a 65 year old black female who  presented to [facility] with substernal chest discomfort.  The  patient has a history of hypertension and hyperthyroidism, gastroesophageal  reflux disease, dyslipidemia, and tobacco use.  She was admitted to the  hospital and stabilized medically, and ruled out for an acute myocardial  infarction.  She is referred for further assessment.   DESCRIPTION OF PROCEDURE:  An informed consent was obtained and the patient  was brought to the cardiac catheterization laboratory.  A 6-French sheath  was placed in the right femoral artery using the modified Seldinger  technique.  The 6-French JL4 and JR4 catheters were then used to engage the  left and right coronary arteries, and selective angiography was performed in  various projections  using manual injections of contrast.  A 6-French  pigtail catheter was then advanced into the left ventricle.  A left  ventriculogram was performed using power injections of contrast.  An AL2 and  then an AL1 catheter were introduced, with the later catheter finally  engaging along with the left circumflex artery, and a selective angiography  was performed using manual injections of contrast.  A the termination of the  case the catheters  and sheaths were removed, and a Perclose closure device  deployed to the right femoral artery until adequate hemostasis was achieved.  The patient tolerated the procedure well and was transferred to the floor in  stable condition.   FINDINGS:  1. Left main trunk:  Nonexistent.  2. The left anterior descending coronary artery and the left circumflex     artery have separate origins.  3. Left anterior descending coronary artery:  This is medium caliber vessel     that provides three diagonal branches.  The LAD has luminal disease in     the proximal midsection.  4. 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  This vessel provides there marginal branches and has     luminal disease.  5. Right coronary artery:  The right coronary artery is dominant.  It is a     medium-caliber vessel that provides the posterior descending artery and  posterior ventricular branch in the terminal segment.  The right coronary     has mild disease of 30% in the proximal segment.  6. Left ventricle:  Normal end systolic and end diastolic dimensions.     Overall ventricular function is well-preserved.  The ejection fraction is     30%-35%.  No mitral regurgitation.  The LV pressure is 120/10.  Aortic     was 120/60.  LVEDP equals 12.   ASSESSMENT:  The patient is a 65 year old female with noncritical coronary  artery disease and well-preserved left ventricular function.   PLAN:  Risk factor modification will proceed and other cause of chest pain  investigated.                                                Veneda Melter, M.D. [facility]    NG/MEDQ  D:  02/12/2002  T:  02/12/2002  Job:  [id number]   cc:   Willa Rough, M.D. [facility]   Dr. [facility]   Dr. Noralee Chars, [place]

## 2010-08-03 NOTE — Discharge Summary (Signed)
NAME:  Connie Ponce, Connie Ponce                         ACCOUNT NO.:  [id number]   MEDICAL RECORD NO.:  [id number]                   PATIENT TYPE:  INP   LOCATION:  4703                                 FACILITY:  [facility]   PHYSICIAN:  Learta Codding, M.D. [facility]             DATE OF BIRTH:  03-06-1946   DATE OF ADMISSION:  02/12/2002  DATE OF DISCHARGE:  02/13/2002                           DISCHARGE SUMMARY - REFERRING   BRIEF HISTORY:  This is a 65 year old female with no previous history of  coronary artery disease but with multiple cardiac risk factors including  hypertension, elevated cholesterol levels, history of tobacco use, and  positive family history of coronary artery disease as well as obesity.  She  was admitted to [facility] on February 10, 2002 for evaluation of  chest pain.  Arrangements were made to transfer the patient to [facility] for cardiac catheterization.   PAST MEDICAL HISTORY:  Please see history as noted above.  The patient has a  history of hypertension, hypothyroidism, hyperlipidemia, gastroesophageal  reflux disease, obesity, and chronic back pain.   ALLERGIES:  No known drug allergies.   MEDICATIONS PRIOR TO ADMISSION:  Hydrochlorothiazide, Lipitor, and  Synthroid.   SOCIAL HISTORY:  The patient is married.  She has three children.  She  smokes one-half pack of cigarettes per day and has done so for 25 years.  She drinks alcohol occasionally.   HOSPITAL COURSE:  As noted this patient was admitted to [facility]  on February 12, 2002 in transfer from [facility] for further  evaluation of chest pain.  She underwent cardiac catheterization on February 12, 2002 performed by Dr. Chales Abrahams.  She was found to have mild coronary artery  disease with a 30-40% RCA lesion.  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  The ejection fraction was noted to be greater  than 55%.  It was felt that  she had noncritical coronary artery disease and medical management was  recommended.  Please see Dr. Urban Gibson dictated report for full details.   The patient was seen the following day.  She had some mild nausea and she  had some vomiting earlier.  She was given an antiemetic per the p.r.n.  standing orders.  She felt better later that day and arrangements were made  to proceed with discharge.  A D-dimer was ordered prior to discharge.  It  was found to be low at 0.37.  The patient was discharged in stable  condition.   LABORATORY DATA:  CBC on the day of discharge revealed hemoglobin 13.1;  hematocrit 39.8; wbc's 5.1; platelets 208,000.  BUN 21, creatinine 1.4,  potassium 3.7, glucose 84.  Lipid profile revealed cholesterol 225,  triglycerides 250, HDL 41, LDL 134.  TSH was high at 7.145.  Chest x-ray  showed  no active disease.  As noted, the D-dimer was within normal range at  0.37.   DISCHARGE MEDICATIONS:  The patient was discharged on the same medications  she was on prior to admission:  1. Levoxyl 0.2 mg daily.  2. Prevacid 30 mg daily.  3. Lipitor 40 mg at bedtime.  4. Aggrenox 200/25 b.i.d.  5. Triamterene/hydrochlorothiazide 70/50 one daily.  6. Coated aspirin 325 mg daily.   ACTIVITY:  The patient was told to avoid any strenuous activity for at least  two days.   DIET:  She was to be on a low salt/low fat diet.   SPECIAL INSTRUCTIONS:  She was told to call the office if she had any  problems with her catheterization site.  She was told to try to quit  smoking.   FOLLOW-UP:  She was told to follow up with Dr. Barbera Setters in one to two weeks  and to follow up with Dr. Lita Mains to have her catheterization site checked.   PROBLEM LIST AT TIME OF DISCHARGE:  1. Chest pain, myocardial infarction ruled out.  2. Cardiac catheterization performed February 12, 2002 revealing noncritical     coronary artery disease with normal left ventricle; please see  details as     noted above.  3. History of hypertension.  4. History of hypothyroidism with elevated TSH.  5. Hyperlipidemia, currently being treated.  6. History of obesity.  7. History of tobacco use.  8. Positive family history of coronary artery disease.  9. History gastroesophageal reflux disease.  10.      Chronic back pain.  11.      Negative D-dimer.     Delton See, P.A. [facility]                  Learta Codding, M.D. [facility]    DR/MEDQ  D:  02/13/2002  T:  02/13/2002  Job:  [id number]   cc:   Jeri Modena  [address]  [place] [address]  Fax: [phone]   Dr. Margaretmary Lombard, [place]

## 2010-08-03 NOTE — H&P (Signed)
NAME:  Connie Ponce, Connie Ponce                         ACCOUNT NO.:  [id number]   MEDICAL RECORD NO.:  [id number]                   PATIENT TYPE:  INP   LOCATION:  4703                                 FACILITY:  [facility]   PHYSICIAN:  Willa Rough, M.D. [facility]              DATE OF BIRTH:  Jul 22, 1945   DATE OF ADMISSION:  02/12/2002  DATE OF DISCHARGE:                                HISTORY & PHYSICAL   REASON FOR ADMISSION:  The patient is a 65 year old female with no prior  history of heart disease, but multiple cardiac risk factors, notable for  hypertension, dyslipidemia, longstanding tobacco smoking, family history of  premature coronary artery disease, and obesity who presented to [facility]  Emergency Room for evaluation of recent onset of progressive exertional  chest pain and dyspnea.   The patient cites previous history of hospitalization in [place], [place],  for evaluation of chest pain. She states that she was diagnosed with GERD  and has had no recurrent burning symptoms since placement on maintenance  Prevacid.   Approximately 2-3 weeks ago, the patient noted development of a dull,  midsternal chest discomfort with occasional radiation to the right shoulder  and upper extremity. This is not strictly correlated with activity or  exertion but definitely worsened by exertion. Symptoms last only a few  minutes and are relieved with rest. There is some occasional associated  dyspnea but no diaphoresis or nausea. The symptoms are decreased as dull,  not sharp, and clearly distinct from her reflux-like symptoms of the past.  Over the past few weeks, she feels that the symptoms have been occurring  more frequently, are more intense, and lasting longer in duration.   Since admission, serial cardiac enzymes showed MB and Troponin-I markers.  Electrocardiogram showed no acute abnormalities. Chest x-ray revealed no  acute processes.   ADMISSION MEDICATIONS:  1.  Triamterene/hydrochlorothiazide 75/50 mg daily  2. Aggrenox 200/25 mg b.i.d.  3. Levoxyl 0.2 mg daily  4. Lipitor 40 mg daily  5. Prevacid 30 mg daily  6. [facility]) Lovenox 100 mg q.12 h.  7. Lopressor 25 mg b.i.d.  8. Aspirin 325 mg daily  9. Hydrochlorothiazide 25 mg daily  10.      Synthroid 0.01 mg daily  11.      Zocor 40 mg daily  12.      Norvasc 5 mg daily  13.      Protonix 40 mg daily  14.      Percocet p.r.n.  15.      Methadone p.r.n.   ALLERGIES:  No known drug allergies. Denies history of iodine/shellfish  allergy.   PAST MEDICAL HISTORY:  1. Cholecystectomy.  2. Hysterectomy.  3. Hypothyroidism.  4. Status post lower back surgery.  5. Gastroesophageal reflux disease.  6. Hypertension.  7. Dyslipidemia.   REVIEW OF SYMPTOMS:  Denies any recent fever, chills, or productive cough.  Denies any  recent hemoptysis, hematuria, or melena. Reports occasional  orthopnea, paroxysmal nocturnal dyspnea, pedal edema. Reports occasional  palpitations. Review of systems, otherwise, as per HPI. Remaining review of  systems negative.   SOCIAL HISTORY:  Has been smoking approximately half a pack per day for the  past 20 years. Has an occasional alcoholic drink.   FAMILY HISTORY:  Father deceased in his early 75s, presumed secondary to  myocardial infarction. Mother age 30, alive and well. The patient's brothers  and sisters have no known coronary artery disease.   LABORATORY DATA:  WBC 4.9, hemoglobin 14.9, hematocrit 44, platelets 240.  Sodium 138, potassium 3.7, chloride 104, CO2 28, glucose 84, BUN 21,  creatinine 1.4. TSH 7.145.  Cardiac enzymes:  CPK/MB 222/3.7, 156/2.5,  144/2.2, Troponin-I markers:  0.01 (x3).   Admission EKG:  NSR at 83 bpm with borderline left axis deviation, early  repolarization changes.   Admission chest x-ray:  No active disease.   PHYSICAL EXAMINATION:  VITAL SIGNS:  Blood pressure 117/52, pulse 70 and  regular, respirations 20,  temperature 98.4.  GENERAL:  A 65 year old female, obese, in no acute distress.  HEENT:  Normocephalic, atraumatic.  NECK:  Preserved bilateral carotid pulses without bruits. No jugular venous  distention.  LUNGS:  Clear to auscultation in all fields.  HEART:  Regular, rate, and rhythm (S1 and S2). No murmurs.  ABDOMEN:  Soft, nontender. Intact bowel sounds without bruits. No  hepatosplenomegaly.  EXTREMITIES:  Preserved bilateral femoral pulses, no bruits. Intact distal  pulses with no significant pedal edema.  NEUROLOGICAL:  Alert and oriented.   IMPRESSION:  1. Crescendo angina pectoris.  2. Progressive exertional dyspnea.  3. Multiple cardiac risk factors.     A. Hypertension.     B. Dyslipidemia.     C. Longstanding tobacco.     D. Family history of premature coronary artery disease.     E. Obesity.  4. Gastroesophageal reflux disease.  5. Treated hypothyroidism. Mildly elevated TSH.  6. Chronic lower back pain.   PLAN:  Following review with Dr. Myrtis Ser, recommendation is to proceed with  direct transfer to [facility] for diagnostic coronary angiography.  The patient has new onset chest discomfort and exertional dyspnea, worrisome  for significant underlying coronary artery disease. She has multiple cardiac  risk factors as cited above. The risks and benefits of proceeding with  catheterization were fully discussed with the patient and she was agreeable  to proceed. Current hospital medication regimen will be continued including  Lovenox. We will check a followup free T3/T4 given the mildly elevated TSH.  We will also check a fasting lipid profile prior to transfer.     Clide Deutscher Serpe, P.A.-C  [facility]              Willa Rough, M.D. [facility]    ECS/MEDQ  D:  02/12/2002  T:  02/12/2002  Job:  [id number]   cc:   Molly Maduro A. Barbera Setters, M.D.

## 2011-11-04 ENCOUNTER — Encounter (HOSPITAL_COMMUNITY): Payer: Self-pay | Admitting: *Deleted

## 2011-11-04 ENCOUNTER — Emergency Department (HOSPITAL_COMMUNITY): Payer: Medicare Other

## 2011-11-04 ENCOUNTER — Emergency Department (HOSPITAL_COMMUNITY)
Admission: EM | Admit: 2011-11-04 | Discharge: 2011-11-04 | Disposition: A | Discharge status: Home / Self Care | Payer: Medicare Other | Attending: Emergency Medicine | Admitting: Emergency Medicine

## 2011-11-04 DIAGNOSIS — I10 Essential (primary) hypertension: Secondary | ICD-10-CM | POA: Insufficient documentation

## 2011-11-04 DIAGNOSIS — Z7982 Long term (current) use of aspirin: Secondary | ICD-10-CM | POA: Insufficient documentation

## 2011-11-04 DIAGNOSIS — F17200 Nicotine dependence, unspecified, uncomplicated: Secondary | ICD-10-CM | POA: Insufficient documentation

## 2011-11-04 DIAGNOSIS — K5900 Constipation, unspecified: Secondary | ICD-10-CM | POA: Insufficient documentation

## 2011-11-04 HISTORY — DX: Essential (primary) hypertension: I10

## 2011-11-04 LAB — URINALYSIS, ROUTINE W REFLEX MICROSCOPIC
Glucose, UA: NEGATIVE mg/dL
Hgb urine dipstick: NEGATIVE
Ketones, ur: NEGATIVE mg/dL
Protein, ur: NEGATIVE mg/dL

## 2011-11-04 MED ORDER — FLEET ENEMA 7-19 GM/118ML RE ENEM
1.0000 | ENEMA | Freq: Once | RECTAL | Status: AC
  Administered 2011-11-04: 1 via RECTAL

## 2011-11-04 NOTE — ED Notes (Signed)
Patient ambulatory to restroom to obtain urine sample

## 2011-11-04 NOTE — ED Notes (Signed)
C/o constipation, abd pain, pt states that she was seen by pcp on Thursday, given miralax without success. Pt admits to nausea today, states that she has only had small amount of bowel movements, one yesterday am and one this am,

## 2011-11-04 NOTE — ED Provider Notes (Signed)
History   This chart was scribed for Ward Givens, MD by Charolett Bumpers . The patient was seen in room APA09/APA09. Patient's care was started at 1027.    CSN: [id number]  Arrival date & time 11/04/11  1002   First MD Initiated Contact with Patient 11/04/11 1027      Chief Complaint  Patient presents with  . "Trouble with bowels"    (Consider location/radiation/quality/duration/timing/severity/associated sxs/prior treatment) HPI Connie Ponce is a 66 y.o. female who has a h/o HTN presents to the Emergency Department complaining of constant, moderate constipation that started 2 weeks ago. Pt reports that she has associated n/v with vomiting one time 2 weeks ago, decreased appetite and abdominal pain that is aggravated with walking and coughing. Pt reports taking Miralax double dose once a daywith minimal relief and small BM's afterwards yesterday and today. Pt states that her last normal BM was 3 weeks ago. Pt reports taking laxatives in the past but denies any h/o constipation. Pt denies any fever or dysuria. Pt denies any new foods or recent travel. She denies any new medications.Pt reports a h/o cystectomy (? Abscess)  from her rectal area due to abscesses 10 years ago. Pt reports a h/o cholecystectomy, appendectomy and abdominal hysterectomy.   PCP: Dr. Ninfa Linden   Past Medical History  Diagnosis Date  . Hypertension     Past Surgical History  Procedure Date  . Cholecystectomy   . Appendectomy   . Abdominal hysterectomy   . Cystectomy     cyst removed from rectal area.   back surgery  No family history on file.  History  Substance Use Topics  . Smoking status: Current Everyday Smoker  . Smokeless tobacco: Not on file  . Alcohol Use: No  Pt is a smoker, denies alcohol use.  Pt is retired.    Review of Systems  Constitutional: Positive for appetite change. Negative for fever.  Gastrointestinal: Positive for nausea, vomiting, abdominal pain and  constipation.  Genitourinary: Negative for dysuria.  All other systems reviewed and are negative.    Allergies  Review of patient's allergies indicates no known allergies.  Home Medications   Current Outpatient Rx  Name Route Sig Dispense Refill  . ASPIRIN EC 81 MG PO TBEC Oral Take 81 mg by mouth daily.    Marland Kitchen VITAMIN D PO Oral Take 2 tablets by mouth 2 (two) times daily.    . OMEGA-3 FATTY ACIDS 1000 MG PO CAPS Oral Take 1 g by mouth 2 (two) times daily.      BP 141/63  Pulse 65  Temp 98.3 F (36.8 C)  Resp 20  Ht 5\' 6"  (1.676 m)  Wt 230 lb (104.327 kg)  BMI 37.12 kg/m2  SpO2 100%  Vital signs normal    Physical Exam  Nursing note and vitals reviewed. Constitutional: She is oriented to person, place, and time. She appears well-developed and well-nourished. No distress.  HENT:  Head: Normocephalic and atraumatic.  Right Ear: External ear normal.  Left Ear: External ear normal.  Nose: Nose normal.  Mouth/Throat: Oropharynx is clear and moist and mucous membranes are normal. No oropharyngeal exudate.  Eyes: Conjunctivae and EOM are normal. Pupils are equal, round, and reactive to light.  Neck: Normal range of motion. Neck supple. No tracheal deviation present.  Cardiovascular: Normal rate, regular rhythm and normal heart sounds.  Exam reveals no gallop and no friction rub.   No murmur heard. Pulmonary/Chest: Effort normal and breath sounds normal.  No respiratory distress. She has no wheezes. She has no rales. She exhibits no tenderness.  Abdominal: Soft. Bowel sounds are normal. She exhibits no distension. There is tenderness. There is no rebound and no guarding.       Several surgical scars consistent with surgical hx. Diffuse tenderness on right side. Active bowel sounds.   Musculoskeletal: Normal range of motion. She exhibits no edema and no tenderness.       Moves all extremities well.   Neurological: She is alert and oriented to person, place, and time. No sensory  deficit.  Skin: Skin is warm and dry.  Psychiatric: She has a normal mood and affect. Her behavior is normal.    ED Course  Procedures (including critical care time)   Medications  sodium phosphate (FLEET) 7-19 GM/118ML enema 1 enema (1 enema Rectal Given 11/04/11 1254)   PT had small results from the enema, states her discomfort is more on the right with corresponds with her xray and will need to have oral meds to resolve.    DIAGNOSTIC STUDIES: Oxygen Saturation is 100% on room air, normal by my interpretation.    COORDINATION OF CARE:  10:45-Discussed planned course of treatment with the patient including UA and x-ray of abdomen, who is agreeable at this time.   11:42-Recheck: Informed pt of imaging and lab results. Will order an enema. Pt is agreeable to plan.   Results for orders placed during the hospital encounter of 11/04/11  URINALYSIS, ROUTINE W REFLEX MICROSCOPIC      Component Value Range   Color, Urine YELLOW  YELLOW   APPearance CLEAR  CLEAR   Specific Gravity, Urine 1.015  1.005 - 1.030   pH 6.0  5.0 - 8.0   Glucose, UA NEGATIVE  NEGATIVE mg/dL   Hgb urine dipstick NEGATIVE  NEGATIVE   Bilirubin Urine NEGATIVE  NEGATIVE   Ketones, ur NEGATIVE  NEGATIVE mg/dL   Protein, ur NEGATIVE  NEGATIVE mg/dL   Urobilinogen, UA 0.2  0.0 - 1.0 mg/dL   Nitrite NEGATIVE  NEGATIVE   Leukocytes, UA NEGATIVE  NEGATIVE   Laboratory interpretation all normal   Dg Abd Acute W/chest  11/04/2011  *RADIOLOGY REPORT*  Clinical Data: Abdominal pain.  Constipation.  History of appendectomy, cholecystectomy and hysterectomy.  ACUTE ABDOMEN SERIES (ABDOMEN 2 VIEW & CHEST 1 VIEW)  Comparison: 10/25/2008  Findings: Bowel gas pattern does not show evidence of ileus, obstruction or free air.  There is a moderate amount of fecal matter in the right colon.  Clips in the right upper quadrant consistent with previous cholecystectomy.  There is degenerative disease of the spine.  One-view chest  shows normal heart size.  There is aortic calcification.  There is minimal scarring at the lung bases.  No free air under the diaphragms.  IMPRESSION: Moderate amount of stool in the right colon.  No evidence of ileus, obstruction or free air.   Original Report Authenticated By: Thomasenia Sales, M.D. ( 11/04/2011 11:22:15 )      1. Constipation     Plan discharge  Devoria Albe, MD, FACEP   MDM  I personally performed the services described in this documentation, which was scribed in my presence. The recorded information has been reviewed and considered.  Devoria Albe, MD, Armando Gang    Ward Givens, MD 11/04/11 [phone]

## 2012-01-17 LAB — CMP AND LIVER
BUN: 17 mg/dL (ref 4–21)
Calcium: 10 mg/dL
Creat: 1.32
Potassium: 3.7 mmol/L
TSH: 1.77 u[IU]/mL (ref 0.41–5.90)

## 2012-01-21 ENCOUNTER — Encounter: Payer: Self-pay | Admitting: Internal Medicine

## 2012-01-22 ENCOUNTER — Encounter: Payer: Self-pay | Admitting: Gastroenterology

## 2012-01-22 ENCOUNTER — Ambulatory Visit (INDEPENDENT_AMBULATORY_CARE_PROVIDER_SITE_OTHER): Payer: Medicare Other | Admitting: Gastroenterology

## 2012-01-22 VITALS — BP 144/74 | HR 72 | Temp 97.8°F | Ht 64.0 in | Wt 235.8 lb

## 2012-01-22 DIAGNOSIS — R1031 Right lower quadrant pain: Secondary | ICD-10-CM

## 2012-01-22 DIAGNOSIS — K5900 Constipation, unspecified: Secondary | ICD-10-CM

## 2012-01-22 DIAGNOSIS — K219 Gastro-esophageal reflux disease without esophagitis: Secondary | ICD-10-CM

## 2012-01-22 MED ORDER — PEG 3350-KCL-NA BICARB-NACL 420 G PO SOLR: 4000.0000 mL | ORAL | Status: DC

## 2012-01-22 NOTE — Progress Notes (Signed)
Primary Care Physician:  PATTERSON, KATHY, FNP  Primary Gastroenterologist:  Michael Rourk, MD   Chief Complaint  Patient presents with  . Colonoscopy  . Abdominal Pain    HPI:  Connie Ponce is a 66 y.o. female here at the request of her PCP, Kathy Patterson, FNP for further evaluation of abdominal pain, constipation. Her last colonoscopy was nearly 10 years ago in [place], [place]. We last saw patient in 2010 during hospitalization for abdominal pain and vomiting. She was actually found to have significant spinal stenosis which was felt because of her pain and vomiting.  Seen in ED 10/2011 for abdominal pain. Abdominal film showed moderate amount of stool in the right colon. She complains of heartburn even on omeprazole. Worse over the past three months. No dysphagia. No n/v. Pain in RLQ and radiates into right flank. Sometimes worse with movement. Unrelated to meals. BM once per day but lot of straining/hard stools. Miralax 3-4 times per week. No melena, brbpr. Oxycodone just as needed. Majority of her pain is in the right lower quadrant. She feels like it is emanating from her spinal stenosis. Does not seem to be related to bowel movements or food.   Current Outpatient Prescriptions  Medication Sig Dispense Refill  . aspirin EC 81 MG tablet Take 81 mg by mouth daily.      . Cholecalciferol (VITAMIN D-3) 5000 UNITS TABS Take 1 tablet by mouth daily.      . diltiazem (TIAZAC) 360 MG 24 hr capsule Take 360 mg by mouth daily.      . escitalopram (LEXAPRO) 10 MG tablet 10 mg daily.      . fish oil-omega-3 fatty acids 1000 MG capsule Take 1 g by mouth 2 (two) times daily.      . levothyroxine (SYNTHROID, LEVOTHROID) 175 MCG tablet Take 175 mcg by mouth daily.      . lisinopril (PRINIVIL,ZESTRIL) 40 MG tablet Take 40 mg by mouth daily.      . metoprolol tartrate (LOPRESSOR) 25 MG tablet Take 25 mg by mouth 2 (two) times daily.      . omeprazole (PRILOSEC) 20 MG capsule Take 20 mg by mouth  daily.      . oxyCODONE-acetaminophen (PERCOCET/ROXICET) 5-325 MG per tablet Take 1 tablet by mouth every 6 (six) hours as needed.       . polyethylene glycol powder (GLYCOLAX/MIRALAX) powder Take 17 g by mouth daily as needed.       . pravastatin (PRAVACHOL) 40 MG tablet Take 40 mg by mouth daily.      . SYMBICORT 160-4.5 MCG/ACT inhaler Inhale 2 puffs into the lungs 2 (two) times daily.       . traZODone (DESYREL) 150 MG tablet Take 300 mg by mouth at bedtime.      . triamterene-hydrochlorothiazide (MAXZIDE-25) 37.5-25 MG per tablet Take 1 tablet by mouth daily.        Allergies as of 01/22/2012  . (No Known Allergies)    Past Medical History  Diagnosis Date  . Hypertension   . Hyperlipidemia   . Hypothyroidism   . Anxiety   . Osteoarthritis   . COPD (chronic obstructive pulmonary disease)   . GERD (gastroesophageal reflux disease)   . Renal insufficiency   . Stroke 1990s    mild  . Pneumonia 2010  . Spinal stenosis 2010    lumbar  . Gastric ulcer     Past Surgical History  Procedure Date  . Cholecystectomy   . Appendectomy   .   Abdominal hysterectomy   . Cystectomy     cyst removed from rectal area.   . Esophagogastroduodenoscopy 01/05/2003    RMR: Normal esophagus, small hiatal hernia/ A couple of tiny antral erosions, otherwise normal stomach normal D1 and D2.   . Colonoscopy     ?2005, [facility]    Family History  Problem Relation Age of Onset  . Colon cancer Neg Hx   . Liver disease Neg Hx     History   Social History  . Marital Status: Married    Spouse Name: N/A    Number of Children: 3  . Years of Education: N/A   Occupational History  . Not on file.   Social History Main Topics  . Smoking status: Current Every Day Smoker -- 0.5 packs/day    Types: Cigarettes  . Smokeless tobacco: Not on file  . Alcohol Use: No  . Drug Use: No  . Sexually Active: Not on file   Other Topics Concern  . Not on file   Social History Narrative  . No  narrative on file      ROS:  General: Negative for anorexia, weight loss, fever, chills, fatigue, weakness. Eyes: Negative for vision changes.  ENT: Negative for hoarseness, difficulty swallowing , nasal congestion. CV: Negative for chest pain, angina, palpitations, dyspnea on exertion, peripheral edema.  Respiratory: Negative for dyspnea at rest, dyspnea on exertion, cough, sputum, wheezing.  GI: See history of present illness. GU:  Negative for dysuria, hematuria, urinary incontinence, urinary frequency, nocturnal urination.  MS: Negative for joint pain.chronic low back pain.  Derm: Negative for rash or itching.  Neuro: Negative for weakness, abnormal sensation, seizure, frequent headaches, memory loss, confusion.  Psych: Negative for anxiety, depression, suicidal ideation, hallucinations.  Endo: Negative for unusual weight change.  Heme: Negative for bruising or bleeding. Allergy: Negative for rash or hives.    Physical Examination:  BP 144/74  Pulse 72  Temp 97.8 F (36.6 C) (Temporal)  Ht 5' 4" (1.626 m)  Wt 235 lb 12.8 oz (106.958 kg)  BMI 40.47 kg/m2   General: Well-nourished, well-developed in no acute distress. obese Head: Normocephalic, atraumatic.   Eyes: Conjunctiva pink, no icterus. Mouth: Oropharyngeal mucosa moist and pink , no lesions erythema or exudate. Neck: Supple without thyromegaly, masses, or lymphadenopathy.  Lungs: Clear to auscultation bilaterally.  Heart: Regular rate and rhythm, no murmurs rubs or gallops.  Abdomen: Bowel sounds are normal, nontender, nondistended, no hepatosplenomegaly or masses, no abdominal bruits or    hernia , no rebound or guarding.  No CVA tenderness Rectal: deferred Extremities: No lower extremity edema. No clubbing or deformities.  Neuro: Alert and oriented x 4 , grossly normal neurologically.  Skin: Warm and dry, no rash or jaundice.   Psych: Alert and cooperative, normal mood and affect.  Labs: Requested with  recent labs from PCP  Imaging Studies: No results found.    

## 2012-01-22 NOTE — Progress Notes (Signed)
Faxed to PCP

## 2012-01-22 NOTE — Assessment & Plan Note (Signed)
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 Seems to be unrelated to bowel movements or food intake. Would advise more aggressive management of her constipation. She's been told to take a capful of MiraLax every evening on days that she does not have a good bowel movement. Colonoscopy in the near future. She will take additional Dulcolax 2 days prior to procedure. She'll take MiraLax daily one week prior to procedure.  I have discussed the risks, alternatives, benefits with regards to but not limited to the risk of reaction to medication, bleeding, infection, perforation and the patient is agreeable to proceed. Written consent to be obtained.  Phenergan 12.5mg  IV 30 mins before procedure to augment conscious sedation.   Review labs when available.

## 2012-01-22 NOTE — Patient Instructions (Addendum)
We have scheduled you for an upper endoscopy and colonoscopy with Dr. Jena Gauss. Please see separate instructions.  For one week prior to your procedure, please take Miralax one capful each day.

## 2012-01-22 NOTE — Assessment & Plan Note (Signed)
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 No dysphagia, vomiting, melena or rectal bleeding. Last EGD was in 2004. Remote history of gastric ulcer. Recommend EGD at this time. She will receive Phenergan 12.5 mg IV 30 minutes prior to procedure to augment conscious sedation given her polypharmacy.  I have discussed the risks, alternatives, benefits with regards to but not limited to the risk of reaction to medication, bleeding, infection, perforation and the patient is agreeable to proceed. Written consent to be obtained.  I have requested labs from her PCP for review.

## 2012-01-27 ENCOUNTER — Telehealth: Payer: Self-pay | Admitting: Internal Medicine

## 2012-01-27 NOTE — Telephone Encounter (Signed)
Received Authorization for colonoscopy scheduled on 02/11/12 with Jonathon Bellows  PAC# [id number]

## 2012-01-27 NOTE — Progress Notes (Signed)
Procedure records from PCP. Labs from 01/17/2012. Glucose 85, BUN 17, creatinine 1.3 to, sodium 140, potassium 3.7, calcium 10, albumin 3.9, total bilirubin 0.2, alkaline phosphatase 92, AST 12, ALT 11, hemoglobin A1c 6%, TSH 1.770.  Procedures as planned.

## 2012-02-04 ENCOUNTER — Encounter (HOSPITAL_COMMUNITY): Payer: Self-pay | Admitting: Pharmacy Technician

## 2012-02-11 ENCOUNTER — Ambulatory Visit (HOSPITAL_COMMUNITY)
Admission: RE | Admit: 2012-02-11 | Discharge: 2012-02-11 | Disposition: A | Discharge status: Home / Self Care | Payer: Medicare Other | Source: Ambulatory Visit | Attending: Internal Medicine | Admitting: Internal Medicine

## 2012-02-11 ENCOUNTER — Encounter (HOSPITAL_COMMUNITY): Payer: Self-pay

## 2012-02-11 ENCOUNTER — Encounter (HOSPITAL_COMMUNITY)
Admission: RE | Disposition: A | Discharge status: Home / Self Care | Payer: Self-pay | Source: Ambulatory Visit | Attending: Internal Medicine

## 2012-02-11 DIAGNOSIS — Z1211 Encounter for screening for malignant neoplasm of colon: Secondary | ICD-10-CM | POA: Insufficient documentation

## 2012-02-11 DIAGNOSIS — R1031 Right lower quadrant pain: Secondary | ICD-10-CM

## 2012-02-11 DIAGNOSIS — K2100 Gastro-esophageal reflux disease with esophagitis, without bleeding: Secondary | ICD-10-CM

## 2012-02-11 DIAGNOSIS — J449 Chronic obstructive pulmonary disease, unspecified: Secondary | ICD-10-CM | POA: Insufficient documentation

## 2012-02-11 DIAGNOSIS — K5900 Constipation, unspecified: Secondary | ICD-10-CM

## 2012-02-11 DIAGNOSIS — K449 Diaphragmatic hernia without obstruction or gangrene: Secondary | ICD-10-CM

## 2012-02-11 DIAGNOSIS — J4489 Other specified chronic obstructive pulmonary disease: Secondary | ICD-10-CM | POA: Insufficient documentation

## 2012-02-11 DIAGNOSIS — K259 Gastric ulcer, unspecified as acute or chronic, without hemorrhage or perforation: Secondary | ICD-10-CM

## 2012-02-11 DIAGNOSIS — I10 Essential (primary) hypertension: Secondary | ICD-10-CM | POA: Insufficient documentation

## 2012-02-11 DIAGNOSIS — K219 Gastro-esophageal reflux disease without esophagitis: Secondary | ICD-10-CM

## 2012-02-11 HISTORY — PX: COLONOSCOPY WITH ESOPHAGOGASTRODUODENOSCOPY (EGD): SHX5779

## 2012-02-11 SURGERY — COLONOSCOPY WITH ESOPHAGOGASTRODUODENOSCOPY (EGD)
Anesthesia: Moderate Sedation

## 2012-02-11 MED ORDER — BUTAMBEN-TETRACAINE-BENZOCAINE 2-2-14 % EX AERO
INHALATION_SPRAY | CUTANEOUS | Status: DC | PRN
  Administered 2012-02-11: 2 via TOPICAL

## 2012-02-11 MED ORDER — SODIUM CHLORIDE 0.9 % IJ SOLN
INTRAMUSCULAR | Status: AC
  Filled 2012-02-11: qty 10

## 2012-02-11 MED ORDER — MIDAZOLAM HCL 5 MG/5ML IJ SOLN
INTRAMUSCULAR | Status: AC
  Filled 2012-02-11: qty 10

## 2012-02-11 MED ORDER — MEPERIDINE HCL 100 MG/ML IJ SOLN
INTRAMUSCULAR | Status: DC | PRN
  Administered 2012-02-11: 50 mg via INTRAVENOUS
  Administered 2012-02-11: 25 mg via INTRAVENOUS

## 2012-02-11 MED ORDER — PROMETHAZINE HCL 25 MG/ML IJ SOLN
INTRAMUSCULAR | Status: AC
  Filled 2012-02-11: qty 1

## 2012-02-11 MED ORDER — STERILE WATER FOR IRRIGATION IR SOLN
Status: DC | PRN
  Administered 2012-02-11: 09:00:00

## 2012-02-11 MED ORDER — SODIUM CHLORIDE 0.45 % IV SOLN
INTRAVENOUS | Status: DC
  Administered 2012-02-11: 08:00:00 via INTRAVENOUS

## 2012-02-11 MED ORDER — MEPERIDINE HCL 100 MG/ML IJ SOLN
INTRAMUSCULAR | Status: AC
  Filled 2012-02-11: qty 2

## 2012-02-11 MED ORDER — MIDAZOLAM HCL 5 MG/5ML IJ SOLN
INTRAMUSCULAR | Status: DC | PRN
  Administered 2012-02-11: 1 mg via INTRAVENOUS
  Administered 2012-02-11: 2 mg via INTRAVENOUS

## 2012-02-11 MED ORDER — PROMETHAZINE HCL 25 MG/ML IJ SOLN
12.5000 mg | Freq: Once | INTRAMUSCULAR | Status: AC
  Administered 2012-02-11: 12.5 mg via INTRAVENOUS

## 2012-02-11 NOTE — H&P (View-Only) (Signed)
Primary Care Physician:  Ninfa Linden, FNP  Primary Gastroenterologist:  Roetta Sessions, MD   Chief Complaint  Patient presents with  . Colonoscopy  . Abdominal Pain    HPI:  Connie Ponce is a 66 y.o. female here at the request of her PCP, Ninfa Linden, FNP for further evaluation of abdominal pain, constipation. Her last colonoscopy was nearly 10 years ago in [place], [place]. We last saw patient in 2010 during hospitalization for abdominal pain and vomiting. She was actually found to have significant spinal stenosis which was felt because of her pain and vomiting.  Seen in ED 10/2011 for abdominal pain. Abdominal film showed moderate amount of stool in the right colon. She complains of heartburn even on omeprazole. Worse over the past three months. No dysphagia. No n/v. Pain in RLQ and radiates into right flank. Sometimes worse with movement. Unrelated to meals. BM once per day but lot of straining/hard stools. Miralax 3-4 times per week. No melena, brbpr. Oxycodone just as needed. Majority of her pain is in the right lower quadrant. She feels like it is emanating from her spinal stenosis. Does not seem to be related to bowel movements or food.   Current Outpatient Prescriptions  Medication Sig Dispense Refill  . aspirin EC 81 MG tablet Take 81 mg by mouth daily.      . Cholecalciferol (VITAMIN D-3) 5000 UNITS TABS Take 1 tablet by mouth daily.      Marland Kitchen diltiazem (TIAZAC) 360 MG 24 hr capsule Take 360 mg by mouth daily.      Marland Kitchen escitalopram (LEXAPRO) 10 MG tablet 10 mg daily.      . fish oil-omega-3 fatty acids 1000 MG capsule Take 1 g by mouth 2 (two) times daily.      Marland Kitchen levothyroxine (SYNTHROID, LEVOTHROID) 175 MCG tablet Take 175 mcg by mouth daily.      Marland Kitchen lisinopril (PRINIVIL,ZESTRIL) 40 MG tablet Take 40 mg by mouth daily.      . metoprolol tartrate (LOPRESSOR) 25 MG tablet Take 25 mg by mouth 2 (two) times daily.      Marland Kitchen omeprazole (PRILOSEC) 20 MG capsule Take 20 mg by mouth  daily.      Marland Kitchen oxyCODONE-acetaminophen (PERCOCET/ROXICET) 5-325 MG per tablet Take 1 tablet by mouth every 6 (six) hours as needed.       . polyethylene glycol powder (GLYCOLAX/MIRALAX) powder Take 17 g by mouth daily as needed.       . pravastatin (PRAVACHOL) 40 MG tablet Take 40 mg by mouth daily.      . SYMBICORT 160-4.5 MCG/ACT inhaler Inhale 2 puffs into the lungs 2 (two) times daily.       . traZODone (DESYREL) 150 MG tablet Take 300 mg by mouth at bedtime.      . triamterene-hydrochlorothiazide (MAXZIDE-25) 37.5-25 MG per tablet Take 1 tablet by mouth daily.        Allergies as of 01/22/2012  . (No Known Allergies)    Past Medical History  Diagnosis Date  . Hypertension   . Hyperlipidemia   . Hypothyroidism   . Anxiety   . Osteoarthritis   . COPD (chronic obstructive pulmonary disease)   . GERD (gastroesophageal reflux disease)   . Renal insufficiency   . Stroke 1990s    mild  . Pneumonia 2010  . Spinal stenosis 2010    lumbar  . Gastric ulcer     Past Surgical History  Procedure Date  . Cholecystectomy   . Appendectomy   .  Abdominal hysterectomy   . Cystectomy     cyst removed from rectal area.   . Esophagogastroduodenoscopy 01/05/2003    RMR: Normal esophagus, small hiatal hernia/ A couple of tiny antral erosions, otherwise normal stomach normal D1 and D2.   . Colonoscopy     ?2005, [facility]    Family History  Problem Relation Age of Onset  . Colon cancer Neg Hx   . Liver disease Neg Hx     History   Social History  . Marital Status: Married    Spouse Name: N/A    Number of Children: 3  . Years of Education: N/A   Occupational History  . Not on file.   Social History Main Topics  . Smoking status: Current Every Day Smoker -- 0.5 packs/day    Types: Cigarettes  . Smokeless tobacco: Not on file  . Alcohol Use: No  . Drug Use: No  . Sexually Active: Not on file   Other Topics Concern  . Not on file   Social History Narrative  . No  narrative on file      ROS:  General: Negative for anorexia, weight loss, fever, chills, fatigue, weakness. Eyes: Negative for vision changes.  ENT: Negative for hoarseness, difficulty swallowing , nasal congestion. CV: Negative for chest pain, angina, palpitations, dyspnea on exertion, peripheral edema.  Respiratory: Negative for dyspnea at rest, dyspnea on exertion, cough, sputum, wheezing.  GI: See history of present illness. GU:  Negative for dysuria, hematuria, urinary incontinence, urinary frequency, nocturnal urination.  MS: Negative for joint pain.chronic low back pain.  Derm: Negative for rash or itching.  Neuro: Negative for weakness, abnormal sensation, seizure, frequent headaches, memory loss, confusion.  Psych: Negative for anxiety, depression, suicidal ideation, hallucinations.  Endo: Negative for unusual weight change.  Heme: Negative for bruising or bleeding. Allergy: Negative for rash or hives.    Physical Examination:  BP 144/74  Pulse 72  Temp 97.8 F (36.6 C) (Temporal)  Ht 5\' 4"  (1.626 m)  Wt 235 lb 12.8 oz (106.958 kg)  BMI 40.47 kg/m2   General: Well-nourished, well-developed in no acute distress. obese Head: Normocephalic, atraumatic.   Eyes: Conjunctiva pink, no icterus. Mouth: Oropharyngeal mucosa moist and pink , no lesions erythema or exudate. Neck: Supple without thyromegaly, masses, or lymphadenopathy.  Lungs: Clear to auscultation bilaterally.  Heart: Regular rate and rhythm, no murmurs rubs or gallops.  Abdomen: Bowel sounds are normal, nontender, nondistended, no hepatosplenomegaly or masses, no abdominal bruits or    hernia , no rebound or guarding.  No CVA tenderness Rectal: deferred Extremities: No lower extremity edema. No clubbing or deformities.  Neuro: Alert and oriented x 4 , grossly normal neurologically.  Skin: Warm and dry, no rash or jaundice.   Psych: Alert and cooperative, normal mood and affect.  Labs: Requested with  recent labs from PCP  Imaging Studies: No results found.

## 2012-02-11 NOTE — Interval H&P Note (Signed)
History and Physical Interval Note:  02/11/2012 8:43 AM  Connie Ponce  has presented today for surgery, with the diagnosis of RLQ PAIN, GERD, CONSTIPATION  The various methods of treatment have been discussed with the patient and family. After consideration of risks, benefits and other options for treatment, the patient has consented to  Procedure(s) (LRB) with comments: COLONOSCOPY WITH ESOPHAGOGASTRODUODENOSCOPY (EGD) (N/A) - 8:30/GIVE PHENERGAN 12.5MG  IV 30 MINS PRIOR TO PROCEDURE as a surgical intervention .  The patient's history has been reviewed, patient examined, no change in status, stable for surgery.  I have reviewed the patient's chart and labs.  Questions were answered to the patient's satisfaction.     Eula Listen  MiraLax for bowels moving better. No change in right sided abdominal pain. EGD colonoscopy per plan.The risks, benefits, limitations, imponderables and alternatives regarding both EGD and colonoscopy have been reviewed with the patient. Questions have been answered. All parties agreeable.

## 2012-02-11 NOTE — Op Note (Signed)
[facility] [address] [place], [address]   COLONOSCOPY PROCEDURE REPORT  PATIENT: Connie Ponce, Connie Ponce  MR#:         [id number] BIRTHDATE: 1945/05/26 , 66  yrs. old GENDER: Female ENDOSCOPIST: R.  Roetta Sessions, MD FACP FACG REFERRED BY:  Ninfa Linden, NP PROCEDURE DATE:  02/11/2012 PROCEDURE:     Screening colonoscopy INDICATIONS: Average risk colorectal cancer screening  INFORMED CONSENT:  The risks, benefits, alternatives and imponderables including but not limited to bleeding, perforation as well as the possibility of a missed lesion have been reviewed.  The potential for biopsy, lesion removal, etc. have also been discussed.  Questions have been answered.  All parties agreeable. Please see the history and physical in the medical record for more information.  MEDICATIONS: Versed 3 mg IV and Demerol 75 mg IV in divided doses. Phenergan 12.5 mg IV  DESCRIPTION OF PROCEDURE:  After a digital rectal exam was performed, the EG-2990i ([id number]) and Pentax Colonoscope [id number] colonoscope was advanced from the anus through the rectum and colon to the area of the cecum, ileocecal valve and appendiceal orifice. The cecum was deeply intubated.  These structures were well-seen and photographed for the record.  From the level of the cecum and ileocecal valve, the scope was slowly and cautiously withdrawn. The mucosal surfaces were carefully surveyed utilizing scope tip deflection to facilitate fold flattening as needed.  The scope was pulled down into the rectum where a thorough examination including retroflexion was performed.    FINDINGS:  Adequate preparation. Normal rectum. Somewhat elongated and redundant, but otherwise normal-appearing colon.  THERAPEUTIC / DIAGNOSTIC MANEUVERS PERFORMED:  None  COMPLICATIONS: None  CECAL WITHDRAWAL TIME:  8 minutes  IMPRESSION:  Normal colon and rectum.  RECOMMENDATIONS: Repeat colonoscopy for screening purposes in  10 years. MiraLax 17 g orally daily to twice daily as needed for constipation. See EGD report.   _______________________________ eSigned:  R. Roetta Sessions, MD FACP [facility] 02/11/2012 9:22 AM   CC:

## 2012-02-11 NOTE — Op Note (Signed)
[facility] [address] [place], [address]   ENDOSCOPY PROCEDURE REPORT  PATIENT: Connie Ponce, Connie Ponce  MR#: [id number] BIRTHDATE: 01/19/1946 , 66  yrs. old GENDER: Female ENDOSCOPIST: R.  Roetta Sessions, MD FACP [facility] REFERRED BY:  Ninfa Linden, NP PROCEDURE DATE:  02/11/2012 PROCEDURE:     EGD with gastric biopsy  INDICATIONS:   Refractory GERD  INFORMED CONSENT:   The risks, benefits, limitations, alternatives and imponderables have been discussed.  The potential for biopsy, esophogeal dilation, etc. have also been reviewed.  Questions have been answered.  All parties agreeable.  Please see the history and physical in the medical record for more information.  MEDICATIONS:   Versed 3 mg IV and Demerol 75 mg IV in divided doses. Phenergan 12.5 mg IV. Cetacaine spray.  DESCRIPTION OF PROCEDURE:   The EG-2990i ([id number])  endoscope was introduced through the mouth and advanced to the second portion of the duodenum without difficulty or limitations.  The mucosal surfaces were surveyed very carefully during advancement of the scope and upon withdrawal.  Retroflexion view of the proximal stomach and esophagogastric junction was performed.      FINDINGS:  Circumferential distal esophageal erosions within 5 mm of the GE junction. There was one tiny ulcer at this level. No Barrett's esophagus. Stomach empty. 3 cm hiatal hernia. Diffuse patchy erosions and erythema diffusely.  Single 1.5 cm x 4 mm elongated ulcer on a fold in the prepyloric/antral mucosa. Please see image 6. It appear to be a benign lesion. Pylorus patent. The first and second portion of the duodenum appeared normal.  THERAPEUTIC / DIAGNOSTIC MANEUVERS PERFORMED:  Biopsies of the antral ulcer and surrounding inflamed mucosa were taken for histologic study   COMPLICATIONS:  None  IMPRESSION:  Erosive and ulcerative reflux esophagitis. Hiatal hernia. Gastric ulcer with satellite  erosions-status post biopsy  RECOMMENDATIONS:  Avoid nonsteroidal drugs. Follow up on pathology. Begin Prevacid 30 mg orally twice daily in lieu of omeprazole.  See colonoscopy report    _______________________________ R. Roetta Sessions, MD FACP [facility] eSigned:  R. Roetta Sessions, MD FACP [facility] 02/11/2012 9:05 AM     CC:  PATIENT NAME:  [name], [name] MR#: [id number]

## 2012-02-13 ENCOUNTER — Encounter: Payer: Self-pay | Admitting: Internal Medicine

## 2012-02-16 ENCOUNTER — Encounter (HOSPITAL_COMMUNITY): Payer: Self-pay | Admitting: Internal Medicine

## 2012-02-17 ENCOUNTER — Encounter: Payer: Self-pay | Admitting: *Deleted

## 2012-04-13 ENCOUNTER — Ambulatory Visit (HOSPITAL_COMMUNITY)
Admission: RE | Admit: 2012-04-13 | Discharge: 2012-04-13 | Disposition: A | Discharge status: Home / Self Care | Payer: Medicare Other | Source: Ambulatory Visit | Attending: Nurse Practitioner | Admitting: Nurse Practitioner

## 2012-04-13 ENCOUNTER — Other Ambulatory Visit (HOSPITAL_COMMUNITY): Payer: Self-pay | Admitting: Nurse Practitioner

## 2012-04-13 DIAGNOSIS — R509 Fever, unspecified: Secondary | ICD-10-CM

## 2012-04-13 DIAGNOSIS — R1031 Right lower quadrant pain: Secondary | ICD-10-CM | POA: Insufficient documentation

## 2012-06-03 ENCOUNTER — Encounter: Payer: Self-pay | Admitting: Internal Medicine

## 2013-03-24 ENCOUNTER — Emergency Department (HOSPITAL_COMMUNITY)
Admission: EM | Admit: 2013-03-24 | Discharge: 2013-03-24 | Disposition: A | Discharge status: Home / Self Care | Payer: Medicare Other | Attending: Emergency Medicine | Admitting: Emergency Medicine

## 2013-03-24 ENCOUNTER — Encounter (HOSPITAL_COMMUNITY): Payer: Self-pay | Admitting: Emergency Medicine

## 2013-03-24 ENCOUNTER — Emergency Department (HOSPITAL_COMMUNITY): Payer: Medicare Other

## 2013-03-24 DIAGNOSIS — M1990 Unspecified osteoarthritis, unspecified site: Secondary | ICD-10-CM | POA: Insufficient documentation

## 2013-03-24 DIAGNOSIS — J449 Chronic obstructive pulmonary disease, unspecified: Secondary | ICD-10-CM | POA: Insufficient documentation

## 2013-03-24 DIAGNOSIS — E039 Hypothyroidism, unspecified: Secondary | ICD-10-CM | POA: Insufficient documentation

## 2013-03-24 DIAGNOSIS — IMO0001 Reserved for inherently not codable concepts without codable children: Secondary | ICD-10-CM | POA: Insufficient documentation

## 2013-03-24 DIAGNOSIS — Z8701 Personal history of pneumonia (recurrent): Secondary | ICD-10-CM | POA: Insufficient documentation

## 2013-03-24 DIAGNOSIS — Z87448 Personal history of other diseases of urinary system: Secondary | ICD-10-CM | POA: Insufficient documentation

## 2013-03-24 DIAGNOSIS — Z7982 Long term (current) use of aspirin: Secondary | ICD-10-CM | POA: Insufficient documentation

## 2013-03-24 DIAGNOSIS — R059 Cough, unspecified: Secondary | ICD-10-CM

## 2013-03-24 DIAGNOSIS — Z9089 Acquired absence of other organs: Secondary | ICD-10-CM | POA: Insufficient documentation

## 2013-03-24 DIAGNOSIS — E785 Hyperlipidemia, unspecified: Secondary | ICD-10-CM | POA: Insufficient documentation

## 2013-03-24 DIAGNOSIS — R05 Cough: Secondary | ICD-10-CM

## 2013-03-24 DIAGNOSIS — J4489 Other specified chronic obstructive pulmonary disease: Secondary | ICD-10-CM | POA: Insufficient documentation

## 2013-03-24 DIAGNOSIS — J069 Acute upper respiratory infection, unspecified: Secondary | ICD-10-CM | POA: Insufficient documentation

## 2013-03-24 DIAGNOSIS — Z79899 Other long term (current) drug therapy: Secondary | ICD-10-CM | POA: Insufficient documentation

## 2013-03-24 DIAGNOSIS — Z8673 Personal history of transient ischemic attack (TIA), and cerebral infarction without residual deficits: Secondary | ICD-10-CM | POA: Insufficient documentation

## 2013-03-24 DIAGNOSIS — Z8719 Personal history of other diseases of the digestive system: Secondary | ICD-10-CM | POA: Insufficient documentation

## 2013-03-24 DIAGNOSIS — I10 Essential (primary) hypertension: Secondary | ICD-10-CM | POA: Insufficient documentation

## 2013-03-24 DIAGNOSIS — F411 Generalized anxiety disorder: Secondary | ICD-10-CM | POA: Insufficient documentation

## 2013-03-24 DIAGNOSIS — F17200 Nicotine dependence, unspecified, uncomplicated: Secondary | ICD-10-CM | POA: Insufficient documentation

## 2013-03-24 MED ORDER — AZITHROMYCIN 250 MG PO TABS: ORAL_TABLET | ORAL | Status: DC

## 2013-03-24 MED ORDER — GUAIFENESIN-CODEINE 100-10 MG/5ML PO SYRP: 10.0000 mL | ORAL_SOLUTION | Freq: Three times a day (TID) | ORAL | Status: DC | PRN

## 2013-03-24 MED ORDER — METOCLOPRAMIDE HCL 10 MG PO TABS
10.0000 mg | ORAL_TABLET | Freq: Once | ORAL | Status: AC
  Administered 2013-03-24: 10 mg via ORAL
  Filled 2013-03-24: qty 1

## 2013-03-24 MED ORDER — ACETAMINOPHEN 325 MG PO TABS
650.0000 mg | ORAL_TABLET | Freq: Once | ORAL | Status: AC
  Administered 2013-03-24: 650 mg via ORAL
  Filled 2013-03-24: qty 2

## 2013-03-24 MED ORDER — ALBUTEROL SULFATE HFA 108 (90 BASE) MCG/ACT IN AERS
2.0000 | INHALATION_SPRAY | Freq: Once | RESPIRATORY_TRACT | Status: AC
Start: 2013-03-24 — End: 2013-03-24
  Administered 2013-03-24: 2 via RESPIRATORY_TRACT
  Filled 2013-03-24: qty 6.7

## 2013-03-24 MED ORDER — KETOROLAC TROMETHAMINE 30 MG/ML IJ SOLN
30.0000 mg | Freq: Once | INTRAMUSCULAR | Status: AC
  Administered 2013-03-24: 30 mg via INTRAMUSCULAR
  Filled 2013-03-24: qty 1

## 2013-03-24 NOTE — ED Provider Notes (Signed)
CSN: [id number]     Arrival date & time 03/24/13  1035 History   First MD Initiated Contact with Patient 03/24/13 1204     Chief Complaint  Patient presents with  . Influenza   (Consider location/radiation/quality/duration/timing/severity/associated sxs/prior Treatment) Patient is a 68 y.o. female presenting with URI. The history is provided by the patient.  URI Presenting symptoms: congestion, cough, fever and rhinorrhea   Presenting symptoms: no sore throat   Congestion:    Location:  Nasal and chest   Interferes with sleep: yes     Interferes with eating/drinking: no   Cough:    Cough characteristics:  Productive   Sputum characteristics:  Nondescript   Severity:  Moderate   Onset quality:  Gradual   Duration:  2 days   Timing:  Intermittent   Progression:  Unchanged   Chronicity:  New Fever:    Timing:  Intermittent   Temp source:  Subjective   Progression:  Waxing and waning Severity:  Mild Onset quality:  Gradual Duration:  2 days Progression:  Unchanged Chronicity:  New Relieved by:  Nothing Worsened by:  Nothing tried Ineffective treatments:  None tried Associated symptoms: myalgias and sneezing   Associated symptoms: no headaches, no neck pain, no swollen glands and no wheezing   Risk factors: being elderly and sick contacts   Risk factors: no diabetes mellitus, no immunosuppression and no recent travel     Past Medical History  Diagnosis Date  . Hypertension   . Hyperlipidemia   . Hypothyroidism   . Anxiety   . Osteoarthritis   . COPD (chronic obstructive pulmonary disease)   . GERD (gastroesophageal reflux disease)   . Renal insufficiency   . Stroke 1990s    mild  . Pneumonia 2010  . Spinal stenosis 2010    lumbar  . Gastric ulcer    Past Surgical History  Procedure Laterality Date  . Cholecystectomy    . Appendectomy    . Abdominal hysterectomy    . Cystectomy      cyst removed from rectal area.   . Esophagogastroduodenoscopy  01/05/2003     RMR: Normal esophagus, small hiatal hernia/ A couple of tiny antral erosions, otherwise normal stomach normal D1 and D2.   . Colonoscopy      ?2005, [facility]  . Colonoscopy with esophagogastroduodenoscopy (egd)  02/11/2012    Procedure: COLONOSCOPY WITH ESOPHAGOGASTRODUODENOSCOPY (EGD);  Surgeon: Corbin Ade, MD;  Location: [facility];  Service: Endoscopy;  Laterality: N/A;  8:30/GIVE PHENERGAN 12.5MG  IV 30 MINS PRIOR TO PROCEDURE   Family History  Problem Relation Age of Onset  . Colon cancer Neg Hx   . Liver disease Neg Hx    History  Substance Use Topics  . Smoking status: Current Every Day Smoker -- 0.50 packs/day    Types: Cigarettes  . Smokeless tobacco: Not on file  . Alcohol Use: No   OB History   Grav Para Term Preterm Abortions TAB SAB Ect Mult Living                 Review of Systems  Constitutional: Positive for fever. Negative for chills, activity change and appetite change.  HENT: Positive for congestion, rhinorrhea and sneezing. Negative for facial swelling, sore throat and trouble swallowing.   Eyes: Negative for visual disturbance.  Respiratory: Positive for cough. Negative for chest tightness, shortness of breath, wheezing and stridor.   Cardiovascular: Negative for chest pain.  Gastrointestinal: Negative for nausea, vomiting and abdominal  pain.  Genitourinary: Negative for dysuria and flank pain.  Musculoskeletal: Positive for myalgias. Negative for neck pain and neck stiffness.  Skin: Negative.   Neurological: Negative for dizziness, syncope, weakness, numbness and headaches.  Hematological: Negative for adenopathy.  Psychiatric/Behavioral: Negative for confusion.  All other systems reviewed and are negative.    Allergies  Review of patient's allergies indicates no known allergies.  Home Medications   Current Outpatient Rx  Name  Route  Sig  Dispense  Refill  . aspirin EC 81 MG tablet   Oral   Take 81 mg by mouth daily.          . budesonide-formoterol (SYMBICORT) 160-4.5 MCG/ACT inhaler   Inhalation   Inhale 2 puffs into the lungs 2 (two) times daily as needed. Shortness of Breath         . Cholecalciferol (VITAMIN D-3) 5000 UNITS TABS   Oral   Take 1 tablet by mouth daily.         Marland Kitchen. diltiazem (TIAZAC) 360 MG 24 hr capsule   Oral   Take 360 mg by mouth daily.         . fish oil-omega-3 fatty acids 1000 MG capsule   Oral   Take 1 g by mouth 2 (two) times daily.         Marland Kitchen. levothyroxine (SYNTHROID, LEVOTHROID) 175 MCG tablet   Oral   Take 175 mcg by mouth daily.         Marland Kitchen. lisinopril (PRINIVIL,ZESTRIL) 40 MG tablet   Oral   Take 40 mg by mouth daily.         . metoprolol tartrate (LOPRESSOR) 25 MG tablet   Oral   Take 25 mg by mouth 2 (two) times daily.         Marland Kitchen. oxyCODONE-acetaminophen (PERCOCET/ROXICET) 5-325 MG per tablet   Oral   Take 1 tablet by mouth every 6 (six) hours as needed. Pain         . Phenylephrine-DM-GG (MUCINEX FAST-MAX CONGEST COUGH PO)   Oral   Take 1 tablet by mouth daily as needed. Coughing         . Polyethyl Glycol-Propyl Glycol (SYSTANE OP)   Right Eye   Place 1 drop into the right eye daily.         . polyethylene glycol-electrolytes (TRILYTE) 420 G solution   Oral   Take 4,000 mLs by mouth as directed.   4000 mL   0   . pravastatin (PRAVACHOL) 40 MG tablet   Oral   Take 40 mg by mouth daily.         . traZODone (DESYREL) 150 MG tablet   Oral   Take 300 mg by mouth at bedtime.         . triamterene-hydrochlorothiazide (MAXZIDE-25) 37.5-25 MG per tablet   Oral   Take 1 tablet by mouth daily.          BP 163/86  Pulse 83  Temp(Src) 99.2 F (37.3 C) (Oral)  Resp 20  Ht 5\' 4"  (1.626 m)  Wt 230 lb (104.327 kg)  BMI 39.46 kg/m2  SpO2 96% Physical Exam  Nursing note and vitals reviewed. Constitutional: She is oriented to person, place, and time. She appears well-developed and well-nourished. No distress.  HENT:  Head:  Normocephalic and atraumatic.  Right Ear: Tympanic membrane and ear canal normal.  Left Ear: Tympanic membrane and ear canal normal.  Nose: Mucosal edema and rhinorrhea present.  Mouth/Throat: Uvula is midline and  mucous membranes are normal. No trismus in the jaw. No uvula swelling. Posterior oropharyngeal erythema present. No oropharyngeal exudate, posterior oropharyngeal edema or tonsillar abscesses.  Eyes: Conjunctivae are normal.  Neck: Normal range of motion, full passive range of motion without pain and phonation normal. Neck supple. No Brudzinski's sign and no Kernig's sign noted.  Cardiovascular: Normal rate, regular rhythm, normal heart sounds and intact distal pulses.   No murmur heard. Pulmonary/Chest: Effort normal and breath sounds normal. No respiratory distress. She has no wheezes. She has no rales.  Coarse lung sounds bilaterally with few scattered inspiratory wheezes  Abdominal: Soft. She exhibits no distension. There is no tenderness. There is no rebound and no guarding.  Musculoskeletal: She exhibits no edema.  Lymphadenopathy:    She has no cervical adenopathy.  Neurological: She is alert and oriented to person, place, and time. She exhibits normal muscle tone. Coordination normal.  Skin: Skin is warm and dry.    ED Course  Procedures (including critical care time) Labs Review Labs Reviewed - No data to display Imaging Review Dg Chest 2 View  03/24/2013   CLINICAL DATA:  Generalized body aches, cough, fever, history hypertension, COPD  EXAM: CHEST  2 VIEW  COMPARISON:  04/13/2012  FINDINGS: Normal heart size, mediastinal contours, and pulmonary vascularity.  Atherosclerotic calcification aorta.  Emphysematous and minimal bronchitic changes consistent with COPD.  No acute infiltrate, pleural effusion or pneumothorax.  Multilevel endplate spur formation thoracic spine.  Diffuse idiopathic skeletal hyperostosis noted.  IMPRESSION: COPD changes.  No acute abnormalities.    Electronically Signed   By: Ulyses Southward M.D.   On: 03/24/2013 13:06    EKG Interpretation   None       MDM  Patient has received po fluids, Toradol, Reglan, tylenol and albuterol inhaler.  She is feeling better.    Patient is nontoxic appearing. No meningeal signs. Mucous membranes moist.  Sudden onset of symptoms likely related to a flu like illness. Clinical suspicion for PE is low.  Patient has tolerated by mouth fluids without vomiting. She agrees to symptomatic treatment with Zithromax and Robitussin-AC. Vital signs are stable. Patient appears stable for discharge  Shane Badeaux L. Trisha Mangle, PA-C 03/26/13 1614

## 2013-03-24 NOTE — ED Notes (Signed)
Pt sick since Sunday w/ flu like symptoms.

## 2013-03-24 NOTE — ED Notes (Signed)
Pt c/o generalized body aches, cough, fever since Sunday.

## 2013-03-24 NOTE — Discharge Instructions (Signed)
Cough, Adult  A cough is a reflex. It helps you clear your throat and airways. A cough can help heal your body. A cough can last 2 or 3 weeks (acute) or may last more than 8 weeks (chronic). Some common causes of a cough can include an infection, allergy, or a cold. HOME CARE  Only take medicine as told by your doctor.  If given, take your medicines (antibiotics) as told. Finish them even if you start to feel better.  Use a cold steam vaporizer or humidier in your home. This can help loosen thick spit (secretions).  Sleep so you are almost sitting up (semi-upright). Use pillows to do this. This helps reduce coughing.  Rest as needed.  Stop smoking if you smoke. GET HELP RIGHT AWAY IF:  You have yellowish-white fluid (pus) in your thick spit.  Your cough gets worse.  Your medicine does not reduce coughing, and you are losing sleep.  You cough up blood.  You have trouble breathing.  Your pain gets worse and medicine does not help.  You have a fever. MAKE SURE YOU:   Understand these instructions.  Will watch your condition.  Will get help right away if you are not doing well or get worse. Document Released: 11/15/2010 Document Revised: 05/27/2011 Document Reviewed: 11/15/2010 Exit[employer] Patient Information 2014 [employer].  

## 2013-03-27 NOTE — ED Provider Notes (Signed)
Medical screening examination/treatment/procedure(s) were performed by non-physician practitioner and as supervising physician I was immediately available for consultation/collaboration.  EKG Interpretation   None         Sherryann Frese L Decorey Wahlert, MD 03/27/13 1001 

## 2013-04-10 ENCOUNTER — Encounter (HOSPITAL_COMMUNITY): Payer: Self-pay | Admitting: Emergency Medicine

## 2013-04-10 ENCOUNTER — Emergency Department (HOSPITAL_COMMUNITY)
Admission: EM | Admit: 2013-04-10 | Discharge: 2013-04-10 | Disposition: A | Discharge status: Home / Self Care | Payer: Medicare Other | Attending: Emergency Medicine | Admitting: Emergency Medicine

## 2013-04-10 DIAGNOSIS — R5383 Other fatigue: Secondary | ICD-10-CM

## 2013-04-10 DIAGNOSIS — M1990 Unspecified osteoarthritis, unspecified site: Secondary | ICD-10-CM | POA: Insufficient documentation

## 2013-04-10 DIAGNOSIS — Z792 Long term (current) use of antibiotics: Secondary | ICD-10-CM | POA: Insufficient documentation

## 2013-04-10 DIAGNOSIS — J189 Pneumonia, unspecified organism: Secondary | ICD-10-CM | POA: Insufficient documentation

## 2013-04-10 DIAGNOSIS — J4489 Other specified chronic obstructive pulmonary disease: Secondary | ICD-10-CM | POA: Insufficient documentation

## 2013-04-10 DIAGNOSIS — R5381 Other malaise: Secondary | ICD-10-CM | POA: Insufficient documentation

## 2013-04-10 DIAGNOSIS — R110 Nausea: Secondary | ICD-10-CM | POA: Insufficient documentation

## 2013-04-10 DIAGNOSIS — E785 Hyperlipidemia, unspecified: Secondary | ICD-10-CM | POA: Insufficient documentation

## 2013-04-10 DIAGNOSIS — R062 Wheezing: Secondary | ICD-10-CM | POA: Insufficient documentation

## 2013-04-10 DIAGNOSIS — J449 Chronic obstructive pulmonary disease, unspecified: Secondary | ICD-10-CM | POA: Insufficient documentation

## 2013-04-10 DIAGNOSIS — I10 Essential (primary) hypertension: Secondary | ICD-10-CM | POA: Insufficient documentation

## 2013-04-10 DIAGNOSIS — Z7982 Long term (current) use of aspirin: Secondary | ICD-10-CM | POA: Insufficient documentation

## 2013-04-10 DIAGNOSIS — K219 Gastro-esophageal reflux disease without esophagitis: Secondary | ICD-10-CM | POA: Insufficient documentation

## 2013-04-10 DIAGNOSIS — Z79899 Other long term (current) drug therapy: Secondary | ICD-10-CM | POA: Insufficient documentation

## 2013-04-10 DIAGNOSIS — F17200 Nicotine dependence, unspecified, uncomplicated: Secondary | ICD-10-CM | POA: Insufficient documentation

## 2013-04-10 DIAGNOSIS — R509 Fever, unspecified: Secondary | ICD-10-CM | POA: Insufficient documentation

## 2013-04-10 DIAGNOSIS — J069 Acute upper respiratory infection, unspecified: Secondary | ICD-10-CM | POA: Insufficient documentation

## 2013-04-10 DIAGNOSIS — Z8742 Personal history of other diseases of the female genital tract: Secondary | ICD-10-CM | POA: Insufficient documentation

## 2013-04-10 DIAGNOSIS — F411 Generalized anxiety disorder: Secondary | ICD-10-CM | POA: Insufficient documentation

## 2013-04-10 DIAGNOSIS — K259 Gastric ulcer, unspecified as acute or chronic, without hemorrhage or perforation: Secondary | ICD-10-CM | POA: Insufficient documentation

## 2013-04-10 DIAGNOSIS — E039 Hypothyroidism, unspecified: Secondary | ICD-10-CM | POA: Insufficient documentation

## 2013-04-10 MED ORDER — OXYCODONE-ACETAMINOPHEN 5-325 MG PO TABS: 1.0000 | ORAL_TABLET | ORAL | Status: DC | PRN

## 2013-04-10 NOTE — ED Notes (Signed)
Pt reports was diagnosed with the flu on jan 7th.  Reports saw her pcp for follow up and was told has sinus infection.  Pt presently taking levaquin for infection.  Today pt started c/o feeling like has a lump in her throat.

## 2013-04-10 NOTE — ED Provider Notes (Signed)
CSN: [id number]     Arrival date & time 04/10/13  1133 History  This chart was scribed for Connie HornJohn M Atwood Adcock, MD by Shari HeritageAisha Amuda, ED Scribe. The patient was seen in room APA15/APA15. Patient's care was started at 12:59 PM.    Chief Complaint  Patient presents with  . Sore Throat    The history is provided by the patient. No language interpreter was used.    HPI Comments: Connie Ponce is a 68 y.o. female who presents to the Emergency Department complaining of constant moderate sore throat and constant foreign body sensation onset 2-3 weeks ago. She is also complaining of a diffuse mild headache, voice hoarseness, cough, nasal congestion and rhinorrhea. There is associated intermittent wheezing, nausea, and generalized fatigue. She reports an elevated temperature last night of 99. Her last fever was a couple of weeks ago. She denies dysphagia, vomiting, diarrhea or shortness of breath. She is currently on Levaquin prescribed by her PCP, but she was on a different antibiotic prior to starting this. She has a medical history of HTN, hyperlipidemia, COPD, stroke.  Past Medical History  Diagnosis Date  . Hypertension   . Hyperlipidemia   . Hypothyroidism   . Anxiety   . Osteoarthritis   . COPD (chronic obstructive pulmonary disease)   . GERD (gastroesophageal reflux disease)   . Renal insufficiency   . Stroke 1990s    mild  . Pneumonia 2010  . Spinal stenosis 2010    lumbar  . Gastric ulcer    Past Surgical History  Procedure Laterality Date  . Cholecystectomy    . Appendectomy    . Abdominal hysterectomy    . Cystectomy      cyst removed from rectal area.   . Esophagogastroduodenoscopy  01/05/2003    RMR: Normal esophagus, small hiatal hernia/ A couple of tiny antral erosions, otherwise normal stomach normal D1 and D2.   . Colonoscopy      ?2005, [facility]  . Colonoscopy with esophagogastroduodenoscopy (egd)  02/11/2012    Procedure: COLONOSCOPY WITH  ESOPHAGOGASTRODUODENOSCOPY (EGD);  Surgeon: Corbin Adeobert M Rourk, MD;  Location: [facility];  Service: Endoscopy;  Laterality: N/A;  8:30/GIVE PHENERGAN 12.5MG  IV 30 MINS PRIOR TO PROCEDURE   Family History  Problem Relation Age of Onset  . Colon cancer Neg Hx   . Liver disease Neg Hx    History  Substance Use Topics  . Smoking status: Current Every Day Smoker -- 0.50 packs/day    Types: Cigarettes  . Smokeless tobacco: Not on file  . Alcohol Use: No   OB History   Grav Para Term Preterm Abortions TAB SAB Ect Mult Living                 Review of Systems 10 Systems reviewed and all are negative for acute change except as noted in the HPI.   Allergies  Review of patient's allergies indicates no known allergies.  Home Medications   Current Outpatient Rx  Name  Route  Sig  Dispense  Refill  . aspirin EC 81 MG tablet   Oral   Take 81 mg by mouth daily.         . budesonide-formoterol (SYMBICORT) 160-4.5 MCG/ACT inhaler   Inhalation   Inhale 2 puffs into the lungs 2 (two) times daily as needed. Shortness of Breath         . Cholecalciferol (VITAMIN D-3) 5000 UNITS TABS   Oral   Take 1 tablet by mouth daily.         .Marland Kitchen  diltiazem (TIAZAC) 360 MG 24 hr capsule   Oral   Take 360 mg by mouth daily.         . fish oil-omega-3 fatty acids 1000 MG capsule   Oral   Take 1 g by mouth 2 (two) times daily.         Marland Kitchen guaiFENesin-codeine (ROBITUSSIN AC) 100-10 MG/5ML syrup   Oral   Take 10 mLs by mouth 3 (three) times daily as needed for cough.   120 mL   0   . levofloxacin (LEVAQUIN) 750 MG tablet   Oral   Take 750 mg by mouth daily. Starting 04/07/2013 x 10 days.         Marland Kitchen levothyroxine (SYNTHROID, LEVOTHROID) 175 MCG tablet   Oral   Take 175 mcg by mouth daily.         Marland Kitchen lisinopril (PRINIVIL,ZESTRIL) 40 MG tablet   Oral   Take 40 mg by mouth daily.         . metoprolol tartrate (LOPRESSOR) 25 MG tablet   Oral   Take 25 mg by mouth 2 (two) times  daily.         Marland Kitchen omeprazole (PRILOSEC) 20 MG capsule   Oral   Take 20 mg by mouth daily.         Marland Kitchen oxyCODONE-acetaminophen (PERCOCET/ROXICET) 5-325 MG per tablet   Oral   Take 1 tablet by mouth every 6 (six) hours as needed. Pain         . Phenylephrine-DM-GG (MUCINEX FAST-MAX CONGEST COUGH PO)   Oral   Take 1 tablet by mouth daily as needed. Coughing         . pravastatin (PRAVACHOL) 40 MG tablet   Oral   Take 40 mg by mouth daily.         . traZODone (DESYREL) 150 MG tablet   Oral   Take 300 mg by mouth at bedtime.         . triamterene-hydrochlorothiazide (MAXZIDE-25) 37.5-25 MG per tablet   Oral   Take 1 tablet by mouth daily.         Marland Kitchen oxyCODONE-acetaminophen (PERCOCET) 5-325 MG per tablet   Oral   Take 1 tablet by mouth every 4 (four) hours as needed.   10 tablet   0    Triage Vitals: BP 180/81  Pulse 82  Temp(Src) 98.3 F (36.8 C) (Oral)  Resp 20  Ht 5\' 4"  (1.626 m)  Wt 230 lb (104.327 kg)  BMI 39.46 kg/m2  SpO2 97% Physical Exam  Nursing note and vitals reviewed. Constitutional:  Awake, alert, nontoxic appearance.  HENT:  Head: Atraumatic.  Right Ear: Tympanic membrane normal.  Left Ear: Tympanic membrane normal.  Airway patent. Bilateral mild nasal congestion. Speech is slightly hoarse. No drooling.  No stridor.    Eyes: Right eye exhibits no discharge. Left eye exhibits no discharge.  Neck: Neck supple.  Larynx non tender. Left neck is non tender. Right side of the neck has tender anterior cervical lymph nodes. No neck swelling.  Cardiovascular: Normal rate, regular rhythm and normal heart sounds.   No murmur heard. Pulmonary/Chest: Effort normal and breath sounds normal. No stridor. No respiratory distress. She has no wheezes. She has no rales. She exhibits no tenderness.  Abdominal: Soft. Bowel sounds are normal. There is no tenderness. There is no rebound.  Musculoskeletal: She exhibits no tenderness.  Baseline ROM, no obvious new  focal weakness.  Neurological:  Mental status and motor strength appears baseline  for patient and situation.  Skin: No rash noted.  Psychiatric: She has a normal mood and affect.    ED Course  Procedures (including critical care time) DIAGNOSTIC STUDIES: Oxygen Saturation is 97% on room air, normal by my interpretation.    COORDINATION OF CARE: 1:08 PM- Patient informed of clinical course, understand medical decision-making process, and agree with plan.   Labs Review Labs Reviewed - No data to display Imaging Review No results found.  EKG Interpretation   None       MDM   1. URI (upper respiratory infection)    I doubt any other EMC precluding discharge at this time including, but not necessarily limited to the following:deep space neck infection.  I personally performed the services described in this documentation, which was scribed in my presence. The recorded information has been reviewed and is accurate.    Connie Horn, MD 04/10/13 [phone]

## 2013-04-10 NOTE — Discharge Instructions (Signed)
°  RETURN IMMEDIATELY IF you develop shortness of breath, confusion or altered mental status, drooling or inability to swallow, a new rash, become dizzy, faint, or poorly responsive, or are unable to be cared for at home.

## 2013-04-10 NOTE — ED Notes (Signed)
Patient with no complaints at this time. Respirations even and unlabored. Skin warm/dry. Discharge instructions reviewed with patient at this time. Patient given opportunity to voice concerns/ask questions. Patient discharged at this time and left Emergency Department with steady gait.   

## 2014-03-24 ENCOUNTER — Ambulatory Visit: Payer: Self-pay | Admitting: Gastroenterology

## 2014-04-11 ENCOUNTER — Encounter (HOSPITAL_COMMUNITY): Payer: Self-pay | Admitting: Hematology & Oncology

## 2014-04-11 ENCOUNTER — Encounter (HOSPITAL_COMMUNITY): Payer: Medicare HMO | Attending: Hematology & Oncology | Admitting: Hematology & Oncology

## 2014-04-11 VITALS — BP 147/89 | HR 68 | Temp 98.6°F | Resp 20 | Ht 64.0 in | Wt 222.0 lb

## 2014-04-11 DIAGNOSIS — I10 Essential (primary) hypertension: Secondary | ICD-10-CM | POA: Insufficient documentation

## 2014-04-11 DIAGNOSIS — E785 Hyperlipidemia, unspecified: Secondary | ICD-10-CM | POA: Diagnosis not present

## 2014-04-11 DIAGNOSIS — D696 Thrombocytopenia, unspecified: Secondary | ICD-10-CM | POA: Diagnosis not present

## 2014-04-11 DIAGNOSIS — E039 Hypothyroidism, unspecified: Secondary | ICD-10-CM | POA: Insufficient documentation

## 2014-04-11 DIAGNOSIS — F419 Anxiety disorder, unspecified: Secondary | ICD-10-CM | POA: Diagnosis not present

## 2014-04-11 LAB — COMPREHENSIVE METABOLIC PANEL
ALK PHOS: 75 U/L (ref 39–117)
ALT: 18 U/L (ref 0–35)
ANION GAP: 4 — AB (ref 5–15)
AST: 19 U/L (ref 0–37)
Albumin: 3.7 g/dL (ref 3.5–5.2)
BILIRUBIN TOTAL: 0.5 mg/dL (ref 0.3–1.2)
BUN: 18 mg/dL (ref 6–23)
CHLORIDE: 106 mmol/L (ref 96–112)
CO2: 28 mmol/L (ref 19–32)
CREATININE: 1.17 mg/dL — AB (ref 0.50–1.10)
Calcium: 9.6 mg/dL (ref 8.4–10.5)
GFR calc Af Amer: 53 mL/min — ABNORMAL LOW (ref >90)
GFR, EST NON AFRICAN AMERICAN: 46 mL/min — AB (ref >90)
GLUCOSE: 85 mg/dL (ref 70–99)
Potassium: 3.2 mmol/L — ABNORMAL LOW (ref 3.5–5.1)
Sodium: 138 mmol/L (ref 135–145)
TOTAL PROTEIN: 6.8 g/dL (ref 6.0–8.3)

## 2014-04-11 LAB — CBC WITH DIFFERENTIAL/PLATELET
BASOS ABS: 0.1 10*3/uL (ref 0.0–0.1)
BASOS PCT: 1 % (ref 0–1)
EOS ABS: 0.5 10*3/uL (ref 0.0–0.7)
EOS PCT: 9 % — AB (ref 0–5)
HEMATOCRIT: 35.8 % — AB (ref 36.0–46.0)
HEMOGLOBIN: 11.8 g/dL — AB (ref 12.0–15.0)
Lymphocytes Relative: 41 % (ref 12–46)
Lymphs Abs: 2.3 10*3/uL (ref 0.7–4.0)
MCH: 29.9 pg (ref 26.0–34.0)
MCHC: 33 g/dL (ref 30.0–36.0)
MCV: 90.9 fL (ref 78.0–100.0)
MONO ABS: 0.4 10*3/uL (ref 0.1–1.0)
Monocytes Relative: 8 % (ref 3–12)
NEUTROS PCT: 41 % — AB (ref 43–77)
Neutro Abs: 2.3 10*3/uL (ref 1.7–7.7)
Platelets: 162 10*3/uL (ref 150–400)
RBC: 3.94 MIL/uL (ref 3.87–5.11)
RDW: 12.8 % (ref 11.5–15.5)
WBC: 5.7 10*3/uL (ref 4.0–10.5)

## 2014-04-11 LAB — SEDIMENTATION RATE: SED RATE: 9 mm/h (ref 0–22)

## 2014-04-11 NOTE — Patient Instructions (Signed)
[place] [facility]  Discharge Instructions: I have seen you today for low platelets or thrombocytopenia We have ordered a bone marrow biopsy i will see you back after your bone marrow to review the results. If you have problems prior to follow-up please call _______________________________________________________________  Thank you for choosing [place] at [facility] to provide your oncology and hematology care.  To afford each patient quality time with our providers, please arrive at least 15 minutes before your scheduled appointment.  You need to re-schedule your appointment if you arrive 10 or more minutes late.  We strive to give you quality time with our providers, and arriving late affects you and other patients whose appointments are after yours.  Also, if you no show three or more times for appointments you may be dismissed from the clinic.  Again, thank you for choosing [place] [facility] [place] hope is that these requests will allow you access to exceptional care and in a timely manner. _______________________________________________________________  If you have questions after your visit, please contact our office at [phone] between the hours of 8:30 a.m. and 5:00 p.m. Voicemails left after 4:30 p.m. will not be returned until the following business day. _______________________________________________________________  For prescription refill requests, have your pharmacy contact our office. _______________________________________________________________  Recommendations made by the consultant and any test results will be sent to your referring physician. _______________________________________________________________

## 2014-04-11 NOTE — Progress Notes (Signed)
Connie Ponce presented for labwork. Labs per MD order drawn via Peripheral Line 25 gauge needle inserted in lt ac.  Good blood return present. Procedure without incident.  Needle removed intact. Patient tolerated procedure well.

## 2014-04-11 NOTE — Progress Notes (Signed)
[place] NOTE  Patient Care Team: Rodena Medin, FNP as PCP - General (Nurse Practitioner) Daneil Dolin, MD (Gastroenterology)  CHIEF COMPLAINTS/PURPOSE OF CONSULTATION:  Thombocytopenia  HISTORY OF PRESENTING ILLNESS:  Connie Ponce 69 y.o. female is here because of low platelets.  She reports she has been told her platelet count has been slowly falling. She denies any family history of blood disorders. She denies any new medications. She denies change in energy, activity level, night sweats, fever, or chills.  She reports increase in bruising and notes that when she just bumps herself she bruises. She also notices that in the morning she hears her blood "woshing" in her head.   Laboratory studies from August 2015 showed a platelet count of 87,000, normal hemoglobin and hematocrit, and normal white count. Laboratory studies from September 2015 showed a platelet count of 109,000 again other blood counts are preserved. Laboratory studies from November 2015 she will platelet count of 111,000 with preserved hemoglobin, hematocrit and white count. Laboratory studies from December 2015 showed a platelet count of 92,000 with other blood counts being preserved.   MEDICAL HISTORY:  Past Medical History  Diagnosis Date  . Hypertension   . Hyperlipidemia   . Hypothyroidism   . Anxiety   . Osteoarthritis   . COPD (chronic obstructive pulmonary disease)   . GERD (gastroesophageal reflux disease)   . Renal insufficiency   . Stroke 1990s    mild  . Pneumonia 2010  . Spinal stenosis 2010    lumbar  . Gastric ulcer     SURGICAL HISTORY: Past Surgical History  Procedure Laterality Date  . Cholecystectomy    . Appendectomy    . Abdominal hysterectomy    . Cystectomy      cyst removed from rectal area.   . Esophagogastroduodenoscopy  01/05/2003    RMR: Normal esophagus, small hiatal hernia/ A couple of tiny antral erosions, otherwise normal stomach normal D1  and D2.   . Colonoscopy      ?2005, [facility] [facility]  . Colonoscopy with esophagogastroduodenoscopy (egd)  02/11/2012    OMB:[id number] reflux/HH/normal colon and rectum  . Back surgery      SOCIAL HISTORY: History   Social History  . Marital Status: Married    Spouse Name: N/A    Number of Children: 3  . Years of Education: N/A   Occupational History  . Not on file.   Social History Main Topics  . Smoking status: Current Every Day Smoker -- 0.50 packs/day    Types: Cigarettes  . Smokeless tobacco: Not on file  . Alcohol Use: No  . Drug Use: No  . Sexual Activity: No   Other Topics Concern  . Not on file   Social History Narrative  She is married, for 34+ years. 3 children. 3 grandchildren. 3 great grandchildren. She smokes 1/2 ppd. She is trying to quit. She was in the [employer]. Born in [place] [employer] [place] [employer]. No alcohol abuse  FAMILY HISTORY: Family History  Problem Relation Age of Onset  . Colon cancer Neg Hx   . Liver disease Neg Hx    3 Brothers, 3 Sisters. Only 1 sister living. One sister died from breast cancer, one sister died from MI, brothers deceased from MI's. Mother deceased at 69 Father deceased in his 80's, patient was a year old.   ALLERGIES:  has No Known Allergies.  MEDICATIONS:  Current Outpatient Prescriptions  Medication Sig Dispense Refill  . aspirin EC 81 MG  tablet Take 81 mg by mouth daily.    . budesonide-formoterol (SYMBICORT) 160-4.5 MCG/ACT inhaler Inhale 2 puffs into the lungs 2 (two) times daily as needed. Shortness of Breath    . Cholecalciferol (VITAMIN D-3) 5000 UNITS TABS Take 1 tablet by mouth daily.    Marland Kitchen diltiazem (TIAZAC) 360 MG 24 hr capsule Take 360 mg by mouth daily.    Marland Kitchen guaiFENesin-codeine (ROBITUSSIN AC) 100-10 MG/5ML syrup Take 10 mLs by mouth 3 (three) times daily as needed for cough. 120 mL 0  . levothyroxine (SYNTHROID, LEVOTHROID) 175 MCG tablet Take 175 mcg by mouth daily.    Marland Kitchen lisinopril  (PRINIVIL,ZESTRIL) 40 MG tablet Take 40 mg by mouth daily.    . metoprolol tartrate (LOPRESSOR) 25 MG tablet Take 25 mg by mouth 2 (two) times daily.    Marland Kitchen omeprazole (PRILOSEC) 20 MG capsule Take 20 mg by mouth daily.    Marland Kitchen oxyCODONE-acetaminophen (PERCOCET) 5-325 MG per tablet Take 1 tablet by mouth every 4 (four) hours as needed. 10 tablet 0  . Phenylephrine-DM-GG (MUCINEX FAST-MAX CONGEST COUGH PO) Take 1 tablet by mouth daily as needed. Coughing    . pravastatin (PRAVACHOL) 40 MG tablet Take 40 mg by mouth daily.    . ranitidine (ZANTAC) 150 MG tablet Take 150 mg by mouth 2 (two) times daily.    . traZODone (DESYREL) 150 MG tablet Take 300 mg by mouth at bedtime.    . triamterene-hydrochlorothiazide (MAXZIDE-25) 37.5-25 MG per tablet Take 1 tablet by mouth daily.    . fish oil-omega-3 fatty acids 1000 MG capsule Take 1 g by mouth 2 (two) times daily.     No current facility-administered medications for this visit.    Review of Systems  Constitutional: Positive for chills, weight loss and malaise/fatigue. Negative for fever and diaphoresis.       18 lbs weight loss  HENT: Positive for nosebleeds. Negative for congestion, ear discharge, ear pain, hearing loss, sore throat and tinnitus.   Eyes: Positive for blurred vision.       Wears glasses  Respiratory: Positive for cough, shortness of breath and wheezing. Negative for stridor.        Occasional inhaler use  Cardiovascular: Negative.   Gastrointestinal: Positive for heartburn and nausea. Negative for vomiting, abdominal pain, diarrhea, constipation, blood in stool and melena.  Genitourinary: Positive for urgency and frequency. Negative for dysuria, hematuria and flank pain.       3 episodes nocturia  Musculoskeletal: Positive for back pain. Negative for myalgias, joint pain, falls and neck pain.  Skin: Negative.   Neurological: Positive for weakness and headaches. Negative for dizziness, tingling, tremors, sensory change, speech  change, focal weakness, seizures and loss of consciousness.  Endo/Heme/Allergies: Bruises/bleeds easily.  Psychiatric/Behavioral: Negative for depression, suicidal ideas, hallucinations, memory loss and substance abuse. The patient is not nervous/anxious and does not have insomnia.     PHYSICAL EXAMINATION: ECOG PERFORMANCE STATUS: 0 - Asymptomatic  Filed Vitals:   04/11/14 1529  BP: 147/89  Pulse: 68  Temp: 98.6 F (37 C)  Resp: 20   Filed Weights   04/11/14 1529  Weight: 222 lb (100.699 kg)     Physical Exam  Constitutional: She is oriented to person, place, and time and well-developed, well-nourished, and in no distress.  HENT:  Head: Normocephalic and atraumatic.  Nose: Nose normal.  Mouth/Throat: Oropharynx is clear and moist. No oropharyngeal exudate.  Eyes: Conjunctivae and EOM are normal. Pupils are equal, round, and reactive to light.  Right eye exhibits no discharge. Left eye exhibits no discharge. No scleral icterus.  Neck: Normal range of motion. Neck supple. No tracheal deviation present. No thyromegaly present.  Cardiovascular: Normal rate, regular rhythm and normal heart sounds.  Exam reveals no gallop and no friction rub.   No murmur heard. Pulmonary/Chest: Effort normal and breath sounds normal. She has no wheezes. She has no rales.  Abdominal: Soft. Bowel sounds are normal. She exhibits no distension and no mass. There is no tenderness. There is no rebound and no guarding.  Musculoskeletal: Normal range of motion. She exhibits no edema.  Lymphadenopathy:    She has no cervical adenopathy.  Neurological: She is alert and oriented to person, place, and time. She has normal reflexes. No cranial nerve deficit. Gait normal. Coordination normal.  Skin: Skin is warm and dry. No rash noted.  Psychiatric: Mood, memory, affect and judgment normal.  Nursing note and vitals reviewed.    LABORATORY DATA:  I have reviewed the data as listed Lab Results  Component  Value Date   WBC 5.7 04/11/2014   HGB 11.8* 04/11/2014   HCT 35.8* 04/11/2014   MCV 90.9 04/11/2014   PLT 162 04/11/2014     Chemistry      Component Value Date/Time   NA 138 04/11/2014 1648   NA 140 01/17/2012 1155   K 3.2* 04/11/2014 1648   K 3.7 01/17/2012 1155   CL 106 04/11/2014 1648   CO2 28 04/11/2014 1648   BUN 18 04/11/2014 1648   BUN 17 01/17/2012 1155   CREATININE 1.17* 04/11/2014 1648   CREATININE 1.32 01/17/2012 1155      Component Value Date/Time   CALCIUM 9.6 04/11/2014 1648   CALCIUM 10.0 01/17/2012 1155   ALKPHOS 75 04/11/2014 1648   ALKPHOS 92 01/17/2012 1155   AST 19 04/11/2014 1648   AST 12 01/17/2012 1155   ALT 18 04/11/2014 1648   BILITOT 0.5 04/11/2014 1648   BILITOT 0.2 01/17/2012 1155       ASSESSMENT & PLAN:  Thrombocytopenia Pleasant 69 year old female with thrombocytopenia present on laboratory studies dating back to at least August 2015. She denies any new medications including over-the-counter medications or supplements. She denies any change in her performance status or her overall energy level.She is currently having no difficulties with bleeding she does report a mild increase in baseline bruising, but certainly not severe.  I briefly reviewed some of the causes of isolated thrombocytopenia with her. She is not a patient that I would consider at high risk for HIV or HCV infection, we can consider testing for this in the future. 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 It is also possible she could have an underlying MDS and therefore I have also recommended consideration of a bone marrow biopsy. After long discussion she is opted to proceed with a BMBX in the next several weeks instead of delaying. I will plan on seeing her back after her procedure to review the results and make additional recommendations. I have currently explained to her that where her platelet count is does not require  any active treatment only ongoing surveillance. If she develops any worsening bruising or bleeding prior to follow-up I have advised her to come in for repeat CBC.     Orders Placed This Encounter  Procedures  . CT Biopsy    Standing Status: Future     Number of Occurrences:      Standing  Expiration Date: 04/11/2015    Order Specific Question:  Lab orders requested (DO NOT place separate lab orders, these will be automatically ordered during procedure specimen collection):    Answer:  Surgical Pathology    Order Specific Question:  Reason for Exam (SYMPTOM  OR DIAGNOSIS REQUIRED)    Answer:  thrombocytopenia    Order Specific Question:  Preferred imaging location?    Answer:  [facility]  . CBC with Differential  . Comprehensive metabolic panel  . Sedimentation rate  . ANA    All questions were answered. The patient knows to call the clinic with any problems, questions or concerns.    Molli Hazard, MD MD 04/12/2014 8:04 PM

## 2014-04-12 DIAGNOSIS — D696 Thrombocytopenia, unspecified: Secondary | ICD-10-CM | POA: Insufficient documentation

## 2014-04-12 LAB — ANA: ANA: NEGATIVE

## 2014-04-12 NOTE — Assessment & Plan Note (Signed)
Pleasant 69 year old female with thrombocytopenia present on laboratory studies dating back to at least August 2015. She denies any new medications including over-the-counter medications or supplements. She denies any change in her performance status or her overall energy level.She is currently having no difficulties with bleeding she does report a mild increase in baseline bruising, but certainly not severe.  I briefly reviewed some of the causes of isolated thrombocytopenia with her. She is not a patient that I would consider at high risk for HIV or HCV infection, we can consider testing for this in the future. 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 It is also possible she could have an underlying MDS and therefore I have also recommended consideration of a bone marrow biopsy. After long discussion she is opted to proceed with a BMBX in the next several weeks instead of delaying. I will plan on seeing her back after her procedure to review the results and make additional recommendations. I have currently explained to her that where her platelet count is does not require any active treatment only ongoing surveillance. If she develops any worsening bruising or bleeding prior to follow-up I have advised her to come in for repeat CBC.

## 2014-04-20 ENCOUNTER — Other Ambulatory Visit: Payer: Self-pay | Admitting: Radiology

## 2014-04-21 ENCOUNTER — Other Ambulatory Visit: Payer: Self-pay | Admitting: Radiology

## 2014-04-22 ENCOUNTER — Ambulatory Visit (HOSPITAL_COMMUNITY)
Admission: RE | Admit: 2014-04-22 | Discharge: 2014-04-22 | Disposition: A | Discharge status: Home / Self Care | Payer: Medicare HMO | Source: Ambulatory Visit | Attending: Hematology & Oncology | Admitting: Hematology & Oncology

## 2014-04-22 DIAGNOSIS — D696 Thrombocytopenia, unspecified: Secondary | ICD-10-CM | POA: Diagnosis present

## 2014-04-22 HISTORY — PX: BONE MARROW ASPIRATION: SHX1252

## 2014-04-22 HISTORY — PX: BONE MARROW BIOPSY: SHX199

## 2014-04-22 LAB — PROTIME-INR
INR: 1.07 (ref 0.00–1.49)
Prothrombin Time: 14.1 seconds (ref 11.6–15.2)

## 2014-04-22 LAB — CBC
HEMATOCRIT: 37.2 % (ref 36.0–46.0)
Hemoglobin: 12.5 g/dL (ref 12.0–15.0)
MCH: 30.2 pg (ref 26.0–34.0)
MCHC: 33.6 g/dL (ref 30.0–36.0)
MCV: 89.9 fL (ref 78.0–100.0)
PLATELETS: 178 10*3/uL (ref 150–400)
RBC: 4.14 MIL/uL (ref 3.87–5.11)
RDW: 12.8 % (ref 11.5–15.5)
WBC: 4.7 10*3/uL (ref 4.0–10.5)

## 2014-04-22 LAB — BONE MARROW EXAM

## 2014-04-22 LAB — APTT: APTT: 27 s (ref 24–37)

## 2014-04-22 MED ORDER — MIDAZOLAM HCL 2 MG/2ML IJ SOLN
INTRAMUSCULAR | Status: AC
  Filled 2014-04-22: qty 6

## 2014-04-22 MED ORDER — FENTANYL CITRATE 0.05 MG/ML IJ SOLN
INTRAMUSCULAR | Status: AC | PRN
  Administered 2014-04-22: 50 ug via INTRAVENOUS

## 2014-04-22 MED ORDER — SODIUM CHLORIDE 0.9 % IV SOLN
Freq: Once | INTRAVENOUS | Status: AC
  Administered 2014-04-22: 500 mL via INTRAVENOUS

## 2014-04-22 MED ORDER — FENTANYL CITRATE 0.05 MG/ML IJ SOLN
INTRAMUSCULAR | Status: AC
  Filled 2014-04-22: qty 6

## 2014-04-22 MED ORDER — MIDAZOLAM HCL 2 MG/2ML IJ SOLN
INTRAMUSCULAR | Status: AC | PRN
  Administered 2014-04-22: 1 mg via INTRAVENOUS

## 2014-04-22 NOTE — H&P (Signed)
Chief Complaint: "I'm having a bone marrow biopsy"  Referring Physician(s): Penland,Shannon Kristen  History of Present Illness: Connie Ponce is a 69 y.o. female with history of persistent thrombocytopenia of unknown cause who presents today for CT guided bone marrow biopsy for further evaluation.   Past Medical History  Diagnosis Date  . Hypertension   . Hyperlipidemia   . Hypothyroidism   . Anxiety   . Osteoarthritis   . COPD (chronic obstructive pulmonary disease)   . GERD (gastroesophageal reflux disease)   . Renal insufficiency   . Stroke 1990s    mild  . Pneumonia 2010  . Spinal stenosis 2010    lumbar  . Gastric ulcer     Past Surgical History  Procedure Laterality Date  . Cholecystectomy    . Appendectomy    . Abdominal hysterectomy    . Cystectomy      cyst removed from rectal area.   . Esophagogastroduodenoscopy  01/05/2003    RMR: Normal esophagus, small hiatal hernia/ A couple of tiny antral erosions, otherwise normal stomach normal D1 and D2.   . Colonoscopy      ?2005, [place]  . Colonoscopy with esophagogastroduodenoscopy (egd)  02/11/2012    ERD:EYCXKGY/JEHUDJSHFW reflux/HH/normal colon and rectum  . Back surgery      Allergies: Review of patient's allergies indicates no known allergies.  Medications: Prior to Admission medications   Medication Sig Start Date End Date Taking? Authorizing Provider  aspirin EC 81 MG tablet Take 81 mg by mouth daily.   Yes Historical Provider, MD  budesonide-formoterol (SYMBICORT) 160-4.5 MCG/ACT inhaler Inhale 2 puffs into the lungs 2 (two) times daily as needed. Shortness of Breath   Yes Historical Provider, MD  Cholecalciferol (VITAMIN D-3) 5000 UNITS TABS Take 1 tablet by mouth daily.   Yes Historical Provider, MD  diltiazem (TIAZAC) 360 MG 24 hr capsule Take 360 mg by mouth daily.   Yes Historical Provider, MD  guaiFENesin-codeine (ROBITUSSIN AC) 100-10 MG/5ML syrup Take 10 mLs by mouth 3 (three)  times daily as needed for cough. 03/24/13  Yes Tammy L. Triplett, PA-C  levothyroxine (SYNTHROID, LEVOTHROID) 175 MCG tablet Take 175 mcg by mouth daily.   Yes Historical Provider, MD  lisinopril (PRINIVIL,ZESTRIL) 40 MG tablet Take 40 mg by mouth daily.   Yes Historical Provider, MD  metoprolol tartrate (LOPRESSOR) 25 MG tablet Take 25 mg by mouth 2 (two) times daily.   Yes Historical Provider, MD  omeprazole (PRILOSEC) 20 MG capsule Take 20 mg by mouth daily.   Yes Historical Provider, MD  oxyCODONE-acetaminophen (PERCOCET) 5-325 MG per tablet Take 1 tablet by mouth every 4 (four) hours as needed. 04/10/13  Yes Babette Relic, MD  Phenylephrine-DM-GG (MUCINEX FAST-MAX CONGEST COUGH PO) Take 1 tablet by mouth daily as needed. Coughing   Yes Historical Provider, MD  pravastatin (PRAVACHOL) 40 MG tablet Take 40 mg by mouth daily.   Yes Historical Provider, MD  ranitidine (ZANTAC) 150 MG tablet Take 150 mg by mouth 2 (two) times daily.   Yes Historical Provider, MD  traZODone (DESYREL) 150 MG tablet Take 300 mg by mouth at bedtime.   Yes Historical Provider, MD  triamterene-hydrochlorothiazide (MAXZIDE-25) 37.5-25 MG per tablet Take 1 tablet by mouth daily.   Yes Historical Provider, MD    Family History  Problem Relation Age of Onset  . Colon cancer Neg Hx   . Liver disease Neg Hx     History   Social History  . Marital  Status: Married    Spouse Name: N/A    Number of Children: 3  . Years of Education: N/A   Social History Main Topics  . Smoking status: Current Every Day Smoker -- 0.50 packs/day    Types: Cigarettes  . Smokeless tobacco: Not on file  . Alcohol Use: No  . Drug Use: No  . Sexual Activity: No   Other Topics Concern  . Not on file   Social History Narrative      Review of Systems  Constitutional: Positive for chills and fatigue. Negative for fever.  Eyes:       Blurriness left visual field; wears glasses  Respiratory: Positive for cough. Negative for shortness  of breath.   Cardiovascular: Negative for chest pain.  Gastrointestinal: Positive for nausea. Negative for vomiting, abdominal pain and blood in stool.  Genitourinary: Negative for dysuria and hematuria.  Musculoskeletal: Negative for back pain.  Neurological: Positive for headaches.  Hematological: Bruises/bleeds easily.    Vital Signs: BP 178/75 mmHg  Temp(Src) 97.8 F (36.6 C) (Oral)  Resp 20  SpO2 99%  Physical Exam  Constitutional: She is oriented to person, place, and time. She appears well-developed and well-nourished.  Cardiovascular: Normal rate and regular rhythm.   Pulmonary/Chest: Effort normal.  Distant BS bilat  Abdominal: Soft. Bowel sounds are normal. There is no tenderness.  Musculoskeletal: Normal range of motion. She exhibits no edema.  Neurological: She is alert and oriented to person, place, and time.    Imaging: No results found.  Labs:  CBC:  Recent Labs  04/11/14 1648 04/22/14 0735  WBC 5.7 4.7  HGB 11.8* 12.5  HCT 35.8* 37.2  PLT 162 178    COAGS:  Recent Labs  04/22/14 0735  INR 1.07  APTT 27    BMP:  Recent Labs  04/11/14 1648  NA 138  K 3.2*  CL 106  CO2 28  GLUCOSE 85  BUN 18  CALCIUM 9.6  CREATININE 1.17*  GFRNONAA 46*  GFRAA 53*    LIVER FUNCTION TESTS:  Recent Labs  04/11/14 1648  BILITOT 0.5  AST 19  ALT 18  ALKPHOS 75  PROT 6.8  ALBUMIN 3.7    TUMOR MARKERS: No results for input(s): AFPTM, CEA, CA199, CHROMGRNA in the last 8760 hours.  Assessment and Plan: Connie Ponce is a 69 y.o. female with history of persistent thrombocytopenia of unknown cause who presents today for CT guided bone marrow biopsy for further evaluation. Details/risks of procedure d/w pt/family with their understanding and consent.   Signed: Autumn Messing 04/22/2014, 8:29 AM

## 2014-04-22 NOTE — Procedures (Signed)
Technically successful CT guided bone marrow aspiration and biopsy of left iliac crest. No immediate complications.   

## 2014-04-22 NOTE — Discharge Instructions (Signed)
Conscious Sedation, Adult, Care After °Refer to this sheet in the next few weeks. These instructions provide you with information on caring for yourself after your procedure. Your health care provider may also give you more specific instructions. Your treatment has been planned according to current medical practices, but problems sometimes occur. Call your health care provider if you have any problems or questions after your procedure. °WHAT TO EXPECT AFTER THE PROCEDURE  °After your procedure: °· You may feel sleepy, clumsy, and have poor balance for several hours. °· Vomiting may occur if you eat too soon after the procedure. °HOME CARE INSTRUCTIONS °· Do not participate in any activities where you could become injured for at least 24 hours. Do not: °¨ Drive. °¨ Swim. °¨ Ride a bicycle. °¨ Operate heavy machinery. °¨ Cook. °¨ Use power tools. °¨ Climb ladders. °¨ Work from a high place. °· Do not make important decisions or sign legal documents until you are improved. °· If you vomit, drink water, juice, or soup when you can drink without vomiting. Make sure you have little or no nausea before eating solid foods. °· Only take over-the-counter or prescription medicines for pain, discomfort, or fever as directed by your health care provider. °· Make sure you and your family fully understand everything about the medicines given to you, including what side effects may occur. °· You should not drink alcohol, take sleeping pills, or take medicines that cause drowsiness for at least 24 hours. °· If you smoke, do not smoke without supervision. °· If you are feeling better, you may resume normal activities 24 hours after you were sedated. °· Keep all appointments with your health care provider. °SEEK MEDICAL CARE IF: °· Your skin is pale or bluish in color. °· You continue to feel nauseous or vomit. °· Your pain is getting worse and is not helped by medicine. °· You have bleeding or swelling. °· You are still sleepy or  feeling clumsy after 24 hours. °SEEK IMMEDIATE MEDICAL CARE IF: °· You develop a rash. °· You have difficulty breathing. °· You develop any type of allergic problem. °· You have a fever. °MAKE SURE YOU: °· Understand these instructions. °· Will watch your condition. °· Will get help right away if you are not doing well or get worse. °Document Released: 12/23/2012 Document Reviewed: 12/23/2012 °ExitCare® Patient Information ©2015 [employer]. This information is not intended to replace advice given to you by your health care provider. Make sure you discuss any questions you have with your health care provider. °Bone Biopsy, Needle, Care After °Read the instructions outlined below and refer to this sheet in the next few weeks. These discharge instructions provide you with general information on caring for yourself after you leave the hospital. Your caregiver may also give you specific instructions. While your treatment has been planned according to the most current medical practices available, unavoidable complications sometimes occur. If you have any problems or questions after discharge, call your caregiver. °Finding out the results of your test °Not all test results are available during your visit. If your test results are not back during the visit, make an appointment with your caregiver to find out the results. Do not assume everything is normal if you have not heard from your caregiver or the medical facility. It is important for you to follow up on all of your test results.  °SEEK MEDICAL CARE IF:  °· You have redness, swelling, or increasing pain at the site of the biopsy. °·   You have pus coming from the biopsy site. °· You have drainage from the biopsy site lasting longer than 1 day. °· You notice a bad smell coming from the biopsy site or dressing. °· You develop persistent nausea or vomiting. °SEEK IMMEDIATE MEDICAL CARE IF: °· You have a fever. °· You develop a rash. °· You have difficulty  breathing. °· You develop any reaction or side effects to medicines given. °Document Released: 09/21/2004 Document Revised: 12/23/2012 Document Reviewed: 08/09/2008 °ExitCare® Patient Information ©2015 [employer]. This information is not intended to replace advice given to you by your health care provider. Make sure you discuss any questions you have with your health care provider. ° °

## 2014-04-26 ENCOUNTER — Other Ambulatory Visit (HOSPITAL_COMMUNITY): Payer: Self-pay | Admitting: *Deleted

## 2014-04-26 DIAGNOSIS — D696 Thrombocytopenia, unspecified: Secondary | ICD-10-CM

## 2014-05-03 ENCOUNTER — Ambulatory Visit (HOSPITAL_COMMUNITY)
Admission: RE | Admit: 2014-05-03 | Discharge: 2014-05-03 | Disposition: A | Discharge status: Home / Self Care | Payer: Medicare HMO | Source: Ambulatory Visit | Attending: Hematology & Oncology | Admitting: Hematology & Oncology

## 2014-05-03 ENCOUNTER — Encounter (HOSPITAL_COMMUNITY): Payer: Medicare HMO | Attending: Hematology & Oncology | Admitting: Hematology & Oncology

## 2014-05-03 ENCOUNTER — Encounter (HOSPITAL_COMMUNITY): Payer: Medicare HMO

## 2014-05-03 ENCOUNTER — Encounter (HOSPITAL_COMMUNITY): Payer: Self-pay | Admitting: Hematology & Oncology

## 2014-05-03 VITALS — BP 139/60 | HR 64 | Temp 99.1°F | Resp 21 | Wt 221.3 lb

## 2014-05-03 DIAGNOSIS — R05 Cough: Secondary | ICD-10-CM

## 2014-05-03 DIAGNOSIS — E039 Hypothyroidism, unspecified: Secondary | ICD-10-CM | POA: Insufficient documentation

## 2014-05-03 DIAGNOSIS — I10 Essential (primary) hypertension: Secondary | ICD-10-CM | POA: Insufficient documentation

## 2014-05-03 DIAGNOSIS — D696 Thrombocytopenia, unspecified: Secondary | ICD-10-CM | POA: Diagnosis not present

## 2014-05-03 DIAGNOSIS — E785 Hyperlipidemia, unspecified: Secondary | ICD-10-CM | POA: Insufficient documentation

## 2014-05-03 DIAGNOSIS — R059 Cough, unspecified: Secondary | ICD-10-CM

## 2014-05-03 DIAGNOSIS — F419 Anxiety disorder, unspecified: Secondary | ICD-10-CM | POA: Insufficient documentation

## 2014-05-03 NOTE — Patient Instructions (Signed)
[facility] at [facility] Discharge Instructions  RECOMMENDATIONS MADE BY THE CONSULTANT AND ANY TEST RESULTS WILL BE SENT TO YOUR REFERRING PHYSICIAN.  Discussion by Dr. Galen ManilaPenland.  Will check some additional labs today and get a chest xray because of your persistent cough.  We will let you know of any concerns with any test results.  Call our office with any issues or concerns.  Return for follow-up in 2 months with labs and office visit.  Thank you for choosing [facility] at [facility] to provide your oncology and hematology care.  To afford each patient quality time with our provider, please arrive at least 15 minutes before your scheduled appointment time.    You need to re-schedule your appointment should you arrive 10 or more minutes late.  We strive to give you quality time with our providers, and arriving late affects you and other patients whose appointments are after yours.  Also, if you no show three or more times for appointments you may be dismissed from the clinic at the providers discretion.     Again, thank you for choosing [facility].  Our hope is that these requests will decrease the amount of time that you wait before being seen by our physicians.       _____________________________________________________________  Should you have questions after your visit to [facility], please contact our office at [phone] between the hours of 8:30 a.m. and 4:30 p.m.  Voicemails left after 4:30 p.m. will not be returned until the following business day.  For prescription refill requests, have your pharmacy contact our office.

## 2014-05-03 NOTE — Progress Notes (Signed)
Connie Ponce presented for labwork. Labs per MD order drawn via Peripheral Line 23 gauge needle inserted in left AC  Good blood return present. Procedure without incident.  Needle removed intact. Patient tolerated procedure well.

## 2014-05-04 LAB — FOLATE

## 2014-05-04 LAB — CHROMOSOME ANALYSIS, BONE MARROW

## 2014-05-04 LAB — VITAMIN B12: Vitamin B-12: 530 pg/mL (ref 211–911)

## 2014-05-20 NOTE — Progress Notes (Signed)
[place] NOTE  Patient Care Team: Rodena Medin, FNP as PCP - General (Nurse Practitioner) Daneil Dolin, MD (Gastroenterology)  CHIEF COMPLAINTS/PURPOSE OF CONSULTATION:  Thombocytopenia BMBX on 04/22/2014 Diagnosis Bone Marrow, Aspirate,Biopsy, and Clot, left iliac - HYPERCELLULAR BONE MARROW FOR AGE WITH TRILINEAGE HEMATOPOIESIS. - SEE COMMENT. PERIPHERAL BLOOD: - HYPERSEGMENTED NEUTROPHILS Diagnosis Note The bone marrow is hypercellular for age with trilineage hematopoiesis. This includes abundant megakaryocytes with predominantly normal morphology. The peripheral blood and bone marrow shows scattered hypersegmented neutrophils but with no other megaloblastoid changes or significant dyspoiesis identified. The appearance is nonspecific but correlation with serum vitamin B12 and Folate levels is recommended. (BNS:kh 04-25-14)  HISTORY OF PRESENTING ILLNESS:  Connie Ponce 69 y.o. female is here because of low platelets. He is here today for follow-up of bone marrow biopsy. She complains of a cough with sputum production that she states has gotten slightly worse and keeps her awake at night. She also reports "night sweats around her neck that occurred every other week. She had no difficulty with her procedure. Performance status and appetite are unchanged.   MEDICAL HISTORY:  Past Medical History  Diagnosis Date  . Hypertension   . Hyperlipidemia   . Hypothyroidism   . Anxiety   . Osteoarthritis   . COPD (chronic obstructive pulmonary disease)   . GERD (gastroesophageal reflux disease)   . Renal insufficiency   . Stroke 1990s    mild  . Pneumonia 2010  . Spinal stenosis 2010    lumbar  . Gastric ulcer     SURGICAL HISTORY: Past Surgical History  Procedure Laterality Date  . Cholecystectomy    . Appendectomy    . Abdominal hysterectomy    . Cystectomy      cyst removed from rectal area.   . Esophagogastroduodenoscopy  01/05/2003    RMR:  Normal esophagus, small hiatal hernia/ A couple of tiny antral erosions, otherwise normal stomach normal D1 and D2.   . Colonoscopy      ?2005, [place]  . Colonoscopy with esophagogastroduodenoscopy (egd)  02/11/2012    OVZ:CHYIFOY/DXAJOINOMV reflux/HH/normal colon and rectum  . Back surgery    . Bone marrow biopsy Left 04/22/14  . Bone marrow aspiration Left 04/22/14    SOCIAL HISTORY: History   Social History  . Marital Status: Married    Spouse Name: N/A  . Number of Children: 3  . Years of Education: N/A   Occupational History  . Not on file.   Social History Main Topics  . Smoking status: Current Every Day Smoker -- 0.50 packs/day    Types: Cigarettes  . Smokeless tobacco: Never Used  . Alcohol Use: No  . Drug Use: No  . Sexual Activity: No   Other Topics Concern  . Not on file   Social History Narrative  She is married, for 34+ years. 3 children. 3 grandchildren. 3 great grandchildren. She smokes 1/2 ppd. She is trying to quit. She was in the [employer]. Born in [place] [employer] [place] [employer]. No alcohol abuse  FAMILY HISTORY: Family History  Problem Relation Age of Onset  . Colon cancer Neg Hx   . Liver disease Neg Hx    3 Brothers, 3 Sisters. Only 1 sister living. One sister died from breast cancer, one sister died from MI, brothers deceased from MI's. Mother deceased at 31 Father deceased in his 60's, patient was a year old.   ALLERGIES:  has No Known Allergies.  MEDICATIONS:  Current Outpatient Prescriptions  Medication Sig Dispense Refill  . aspirin EC 81 MG tablet Take 81 mg by mouth daily.    . budesonide-formoterol (SYMBICORT) 160-4.5 MCG/ACT inhaler Inhale 2 puffs into the lungs 2 (two) times daily as needed. Shortness of Breath    . Cholecalciferol (VITAMIN D-3) 5000 UNITS TABS Take 1 tablet by mouth daily.    Marland Kitchen diltiazem (TIAZAC) 360 MG 24 hr capsule Take 360 mg by mouth daily.    Marland Kitchen guaiFENesin-codeine (ROBITUSSIN AC) 100-10 MG/5ML syrup Take 10  mLs by mouth 3 (three) times daily as needed for cough. 120 mL 0  . levothyroxine (SYNTHROID, LEVOTHROID) 175 MCG tablet Take 175 mcg by mouth daily.    Marland Kitchen lisinopril (PRINIVIL,ZESTRIL) 40 MG tablet Take 40 mg by mouth daily.    . metoprolol tartrate (LOPRESSOR) 25 MG tablet Take 25 mg by mouth 2 (two) times daily.    Marland Kitchen omeprazole (PRILOSEC) 20 MG capsule Take 20 mg by mouth daily.    Marland Kitchen oxyCODONE-acetaminophen (PERCOCET) 5-325 MG per tablet Take 1 tablet by mouth every 4 (four) hours as needed. 10 tablet 0  . Phenylephrine-DM-GG (MUCINEX FAST-MAX CONGEST COUGH PO) Take 1 tablet by mouth daily as needed. Coughing    . pravastatin (PRAVACHOL) 40 MG tablet Take 40 mg by mouth daily.    . ranitidine (ZANTAC) 150 MG tablet Take 150 mg by mouth 2 (two) times daily.    . traZODone (DESYREL) 150 MG tablet Take 300 mg by mouth at bedtime.    . triamterene-hydrochlorothiazide (MAXZIDE-25) 37.5-25 MG per tablet Take 1 tablet by mouth daily.     No current facility-administered medications for this visit.    Review of Systems  Constitutional: Positive for malaise/fatigue.  HENT: Negative.   Eyes: Negative.   Respiratory: Positive for cough and sputum production.   Cardiovascular: Negative.   Gastrointestinal: Negative.   Genitourinary: Negative.   Musculoskeletal: Positive for joint pain.  Skin: Negative.   Neurological: Negative.   Endo/Heme/Allergies: Negative.   Psychiatric/Behavioral: Negative.     PHYSICAL EXAMINATION: ECOG PERFORMANCE STATUS: 0 - Asymptomatic  Filed Vitals:   05/03/14 1400  BP: 139/60  Pulse: 64  Temp: 99.1 F (37.3 C)  Resp: 21   Filed Weights   05/03/14 1353  Weight: 221 lb 4.8 oz (100.381 kg)     Physical Exam  Constitutional: She is oriented to person, place, and time and well-developed, well-nourished, and in no distress.  HENT:  Head: Normocephalic and atraumatic.  Nose: Nose normal.  Mouth/Throat: Oropharynx is clear and moist. No oropharyngeal  exudate.  Eyes: Conjunctivae and EOM are normal. Pupils are equal, round, and reactive to light. Right eye exhibits no discharge. Left eye exhibits no discharge. No scleral icterus.  Neck: Normal range of motion. Neck supple. No tracheal deviation present. No thyromegaly present.  Cardiovascular: Normal rate, regular rhythm and normal heart sounds.  Exam reveals no gallop and no friction rub.   No murmur heard. Pulmonary/Chest: Effort normal and breath sounds normal. She has no wheezes. She has no rales.  Abdominal: Soft. Bowel sounds are normal. She exhibits no distension and no mass. There is no tenderness. There is no rebound and no guarding.  Musculoskeletal: Normal range of motion. She exhibits no edema.  Lymphadenopathy:    She has no cervical adenopathy.  Neurological: She is alert and oriented to person, place, and time. She has normal reflexes. No cranial nerve deficit. Gait normal. Coordination normal.  Skin: Skin is warm and dry. No rash noted.  Psychiatric:  Mood, memory, affect and judgment normal.  Nursing note and vitals reviewed.    LABORATORY DATA:  I have reviewed the data as listed Lab Results  Component Value Date   WBC 4.7 04/22/2014   HGB 12.5 04/22/2014   HCT 37.2 04/22/2014   MCV 89.9 04/22/2014   PLT 178 04/22/2014     Chemistry      Component Value Date/Time   NA 138 04/11/2014 1648   NA 140 01/17/2012 1155   K 3.2* 04/11/2014 1648   K 3.7 01/17/2012 1155   CL 106 04/11/2014 1648   CO2 28 04/11/2014 1648   BUN 18 04/11/2014 1648   BUN 17 01/17/2012 1155   CREATININE 1.17* 04/11/2014 1648   CREATININE 1.32 01/17/2012 1155      Component Value Date/Time   CALCIUM 9.6 04/11/2014 1648   CALCIUM 10.0 01/17/2012 1155   ALKPHOS 75 04/11/2014 1648   ALKPHOS 92 01/17/2012 1155   AST 19 04/11/2014 1648   AST 12 01/17/2012 1155   ALT 18 04/11/2014 1648   BILITOT 0.5 04/11/2014 1648   BILITOT 0.2 01/17/2012 1155       ASSESSMENT & PLAN:    Thrombocytopenia  I reviewed her bone marrow biopsy results with her and advised her that findings were relatively unremarkable. The findings of hypersegmented neutrophils we will order a D66 and folic acid. I did spent time explaining this to her. Her blood counts at her last visit with [place] had completely normalized. I also addressed this with her as well. I have recommended repeat follow-up with [place] in 2-3 months with a CBC. If her blood counts remain normal then I advised her we could discharge her back to her primary care. We discussed potential causes of her thrombocytopenia including postinfectious.  COUGH  I have recommended chest x-ray today given her ongoing symptoms of cough with sputum production. Her pulmonary exam was benign. She is quite concerned about her symptoms. I will keep her apprised of the results of her chest x-ray when available.   Orders Placed This Encounter  Procedures  . DG Chest 2 View    Standing Status: Future     Number of Occurrences: 1     Standing Expiration Date: 05/03/2015    Order Specific Question:  Reason for Exam (SYMPTOM  OR DIAGNOSIS REQUIRED)    Answer:  persistent cough    Order Specific Question:  Preferred imaging location?    Answer:  [facility]  . CBC with Differential    Standing Status: Future     Number of Occurrences:      Standing Expiration Date: 05/04/2015    All questions were answered. The patient knows to call the clinic with any problems, questions or concerns.    Molli Hazard, MD MD 05/20/2014 1:26 PM

## 2014-05-23 ENCOUNTER — Encounter (HOSPITAL_COMMUNITY): Payer: Self-pay

## 2014-05-25 ENCOUNTER — Encounter: Payer: Self-pay | Admitting: Hematology & Oncology

## 2014-07-04 ENCOUNTER — Encounter: Payer: Self-pay | Admitting: Internal Medicine

## 2014-07-04 ENCOUNTER — Encounter (HOSPITAL_COMMUNITY): Payer: Self-pay | Admitting: Hematology & Oncology

## 2014-07-04 ENCOUNTER — Encounter (HOSPITAL_BASED_OUTPATIENT_CLINIC_OR_DEPARTMENT_OTHER): Payer: Medicare HMO | Admitting: Hematology & Oncology

## 2014-07-04 ENCOUNTER — Encounter (HOSPITAL_COMMUNITY): Payer: Medicare HMO | Attending: Hematology & Oncology

## 2014-07-04 ENCOUNTER — Ambulatory Visit (HOSPITAL_COMMUNITY): Payer: Commercial Managed Care - HMO | Admitting: Hematology & Oncology

## 2014-07-04 VITALS — BP 137/64 | HR 64 | Temp 98.2°F | Resp 18 | Wt 221.8 lb

## 2014-07-04 DIAGNOSIS — E039 Hypothyroidism, unspecified: Secondary | ICD-10-CM | POA: Diagnosis not present

## 2014-07-04 DIAGNOSIS — D696 Thrombocytopenia, unspecified: Secondary | ICD-10-CM

## 2014-07-04 DIAGNOSIS — R05 Cough: Secondary | ICD-10-CM

## 2014-07-04 DIAGNOSIS — I10 Essential (primary) hypertension: Secondary | ICD-10-CM | POA: Diagnosis not present

## 2014-07-04 DIAGNOSIS — E785 Hyperlipidemia, unspecified: Secondary | ICD-10-CM | POA: Diagnosis not present

## 2014-07-04 DIAGNOSIS — F419 Anxiety disorder, unspecified: Secondary | ICD-10-CM | POA: Diagnosis not present

## 2014-07-04 DIAGNOSIS — R059 Cough, unspecified: Secondary | ICD-10-CM

## 2014-07-04 LAB — CBC WITH DIFFERENTIAL/PLATELET
Basophils Absolute: 0.1 10*3/uL (ref 0.0–0.1)
Basophils Relative: 2 % — ABNORMAL HIGH (ref 0–1)
EOS ABS: 0.5 10*3/uL (ref 0.0–0.7)
EOS PCT: 9 % — AB (ref 0–5)
HEMATOCRIT: 37.1 % (ref 36.0–46.0)
HEMOGLOBIN: 12.4 g/dL (ref 12.0–15.0)
Lymphocytes Relative: 36 % (ref 12–46)
Lymphs Abs: 2 10*3/uL (ref 0.7–4.0)
MCH: 30.8 pg (ref 26.0–34.0)
MCHC: 33.4 g/dL (ref 30.0–36.0)
MCV: 92.1 fL (ref 78.0–100.0)
Monocytes Absolute: 0.4 10*3/uL (ref 0.1–1.0)
Monocytes Relative: 7 % (ref 3–12)
NEUTROS ABS: 2.6 10*3/uL (ref 1.7–7.7)
Neutrophils Relative %: 46 % (ref 43–77)
Platelets: 124 10*3/uL — ABNORMAL LOW (ref 150–400)
RBC: 4.03 MIL/uL (ref 3.87–5.11)
RDW: 12.1 % (ref 11.5–15.5)
WBC: 5.6 10*3/uL (ref 4.0–10.5)

## 2014-07-04 NOTE — Patient Instructions (Signed)
[facility] Discharge Instructions  RECOMMENDATIONS MADE BY THE CONSULTANT AND ANY TEST RESULTS WILL BE SENT TO YOUR REFERRING PHYSICIAN.  Exam and discussion by Dr. Galen ManilaPenland Will make referral to Dr. Jena Gaussourk due to your persistent nausea and vomiting Will call you if any concerns with labs Report unusual bruising or bleeding. Follow-up in 6 months with labs and office visit.  Thank you for choosing [facility] to provide your oncology and hematology care.  To afford each patient quality time with our provider, please arrive at least 15 minutes before your scheduled appointment time.    You need to re-schedule your appointment should you arrive 10 or more minutes late.  We strive to give you quality time with our providers, and arriving late affects you and other patients whose appointments are after yours.  Also, if you no show three or more times for appointments you may be dismissed from the clinic at the providers discretion.     Again, thank you for choosing [facility].  Our hope is that these requests will decrease the amount of time that you wait before being seen by our physicians.       _____________________________________________________________  Should you have questions after your visit to [facility], please contact our office at [phone] between the hours of 8:30 a.m. and 4:30 p.m.  Voicemails left after 4:30 p.m. will not be returned until the following business day.  For prescription refill requests, have your pharmacy contact our office.

## 2014-07-05 NOTE — Progress Notes (Signed)
Labs drawn

## 2014-07-28 ENCOUNTER — Ambulatory Visit (INDEPENDENT_AMBULATORY_CARE_PROVIDER_SITE_OTHER): Payer: Medicare HMO | Admitting: Gastroenterology

## 2014-07-28 ENCOUNTER — Encounter: Payer: Self-pay | Admitting: Gastroenterology

## 2014-07-28 VITALS — BP 140/84 | HR 64 | Temp 98.5°F | Ht 65.0 in | Wt 224.4 lb

## 2014-07-28 DIAGNOSIS — R112 Nausea with vomiting, unspecified: Secondary | ICD-10-CM | POA: Diagnosis not present

## 2014-07-28 DIAGNOSIS — R1012 Left upper quadrant pain: Secondary | ICD-10-CM | POA: Insufficient documentation

## 2014-07-28 DIAGNOSIS — K2100 Gastro-esophageal reflux disease with esophagitis, without bleeding: Secondary | ICD-10-CM

## 2014-07-28 DIAGNOSIS — R600 Localized edema: Secondary | ICD-10-CM | POA: Insufficient documentation

## 2014-07-28 DIAGNOSIS — D696 Thrombocytopenia, unspecified: Secondary | ICD-10-CM

## 2014-07-28 DIAGNOSIS — K5900 Constipation, unspecified: Secondary | ICD-10-CM | POA: Diagnosis not present

## 2014-07-28 MED ORDER — PANTOPRAZOLE SODIUM 40 MG PO TBEC
40.0000 mg | DELAYED_RELEASE_TABLET | Freq: Every day | ORAL | Status: DC
Start: 2014-07-28 — End: 2014-11-08

## 2014-07-28 MED ORDER — LUBIPROSTONE 24 MCG PO CAPS: 24.0000 ug | ORAL_CAPSULE | Freq: Two times a day (BID) | ORAL | Status: DC

## 2014-07-28 NOTE — Progress Notes (Signed)
Primary Care Physician: [name], NP  Primary Gastroenterologist:  Garfield Cornea, MD Referring provider: Dr. Whitney Muse  Chief Complaint  Patient presents with  . Nausea  . Emesis    HPI: Connie Ponce is a 69 y.o. female here for further evaluation of nausea and vomiting at the request of Dr. Whitney Muse. She sees Dr. Whitney Muse for thrombocytopenia.  We last saw the patient back in 2013 for abdominal pain and constipation. EGD showed circumferential distal esophageal erosions within 5 mm at the GE junction, one tiny ulcer at this level. No Barrett's. 3 cm hiatal hernia. 1.5 cm x 4 mm elongated ulcer on a fold in the prepyloric/antral mucosa., Biopsy benign, no H pylori. Screening colonoscopy at that time showed a redundant, elongated colon but otherwise unremarkable.  Patient states that her heartburn is not controlled. She's been worse for a few months. Describes heartburn as severe. Associated with vomiting at times but has frequent nausea. Associated with upper abdominal discomfort. Denies any weight loss. Previously on omeprazole 20 mg daily without relief. Also takes ranitidine 150 mg twice daily. Is to be helping. She denies any hematemesis. Previously was on Prevacid but insurance would not pay for it.. Denies dysphagia.  Remains constipated. Has a bowel movement about 3-4 times per week. [place] stool 1 at times. Denies melena rectal bleeding. She did not like taking MiraLAX, quoting side effects and didn't seem to help a lot. She takes pain medication at times for her chronic back pain related to spinal stenosis. Does not take every day.  Complains of chronic lower extremity edema.   Current Outpatient Prescriptions  Medication Sig Dispense Refill  . aspirin EC 81 MG tablet Take 81 mg by mouth daily.    . budesonide-formoterol (SYMBICORT) 160-4.5 MCG/ACT inhaler Inhale 2 puffs into the lungs 2 (two) times daily as needed. Shortness of Breath    . Cholecalciferol (VITAMIN  D-3) 5000 UNITS TABS Take 1 tablet by mouth daily.    Marland Kitchen diltiazem (TIAZAC) 360 MG 24 hr capsule Take 360 mg by mouth daily.    Marland Kitchen levothyroxine (SYNTHROID, LEVOTHROID) 175 MCG tablet Take 175 mcg by mouth daily.    Marland Kitchen lisinopril (PRINIVIL,ZESTRIL) 40 MG tablet Take 40 mg by mouth daily.    . metoprolol tartrate (LOPRESSOR) 25 MG tablet Take 25 mg by mouth 2 (two) times daily.    . Omega-3 Fatty Acids (FISH OIL) 1000 MG CAPS Take 1 capsule by mouth 2 (two) times daily.    Marland Kitchen omeprazole (PRILOSEC) 20 MG capsule Take 20 mg by mouth daily.    Marland Kitchen oxyCODONE-acetaminophen (PERCOCET) 5-325 MG per tablet Take 1 tablet by mouth every 4 (four) hours as needed. 10 tablet 0  . potassium chloride (K-DUR) 10 MEQ tablet     . pravastatin (PRAVACHOL) 40 MG tablet Take 40 mg by mouth daily.    . ranitidine (ZANTAC) 150 MG tablet Take 150 mg by mouth 2 (two) times daily.    . traZODone (DESYREL) 150 MG tablet Take 300 mg by mouth at bedtime.    . triamterene-hydrochlorothiazide (MAXZIDE-25) 37.5-25 MG per tablet Take 1 tablet by mouth daily.     No current facility-administered medications for this visit.    Allergies as of 07/28/2014  . (No Known Allergies)   Past Medical History  Diagnosis Date  . Hypertension   . Hyperlipidemia   . Hypothyroidism   . Anxiety   . Osteoarthritis   . COPD (chronic obstructive pulmonary disease)   .  GERD (gastroesophageal reflux disease)   . Renal insufficiency   . Stroke 1990s    mild  . Pneumonia 2010  . Spinal stenosis 2010    lumbar  . Gastric ulcer    Past Surgical History  Procedure Laterality Date  . Cholecystectomy    . Appendectomy    . Abdominal hysterectomy    . Cystectomy      cyst removed from rectal area.   . Esophagogastroduodenoscopy  01/05/2003    RMR: Normal esophagus, small hiatal hernia/ A couple of tiny antral erosions, otherwise normal stomach normal D1 and D2.   . Colonoscopy      ?2005, [place]  . Colonoscopy with  esophagogastroduodenoscopy (egd)  02/11/2012    RMR: Ulcerative/erosive reflux esophagitis, benign appearing gastric ulcer with unremarkable biopsy, no H pylori. Colonoscopy was unremarkable. Next colonoscopy in 2023  . Back surgery    . Bone marrow biopsy Left 04/22/14  . Bone marrow aspiration Left 04/22/14   Family History  Problem Relation Age of Onset  . Colon cancer Neg Hx   . Liver disease Neg Hx   . Other Mother     died age 69, natural causes   History   Social History  . Marital Status: Married    Spouse Name: N/A  . Number of Children: 3  . Years of Education: N/A   Social History Main Topics  . Smoking status: Current Every Day Smoker -- 0.50 packs/day    Types: Cigarettes  . Smokeless tobacco: Never Used  . Alcohol Use: No  . Drug Use: No  . Sexual Activity: No   Other Topics Concern  . None   Social History Narrative     ROS:  General: Negative for anorexia, weight loss, fever, chills, fatigue, weakness. Eyes: Negative for vision changes.  ENT: Negative for hoarseness, difficulty swallowing , nasal congestion. CV: Negative for chest pain, angina, palpitations, dyspnea on exertion. + peripheral edema.  Respiratory: Negative for dyspnea at rest, dyspnea on exertion, cough, sputum, wheezing.  GI: See history of present illness. GU:  Negative for dysuria, hematuria, urinary incontinence, urinary frequency, nocturnal urination.  MS: Negative for joint pain. + low back pain.  Derm: Negative for rash or itching.  Neuro: Negative for weakness, abnormal sensation, seizure, frequent headaches, memory loss, confusion.  Psych: Negative for anxiety, depression, suicidal ideation, hallucinations.  Endo: Negative for unusual weight change.  Heme: Negative for bruising or bleeding. Allergy: Negative for rash or hives.   Physical Examination:   BP 140/84 mmHg  Pulse 64  Temp(Src) 98.5 F (36.9 C)  Ht _0  (1.651 m)  Wt 224 lb 6.4 oz (101.787 kg)  BMI 37.34  kg/m2  Physical Examination:   General: Well-nourished, well-developed in no acute distress.  Head: Normocephalic, atraumatic.   Eyes: Conjunctiva pink, no icterus. Mouth: Oropharyngeal mucosa moist and pink , no lesions erythema or exudate. Neck: Supple without thyromegaly, masses, or lymphadenopathy.  Lungs: Clear to auscultation bilaterally.  Heart: Regular rate and rhythm, no murmurs rubs or gallops.  Abdomen: Bowel sounds are normal, nontender, obese, no hepatosplenomegaly or masses, no abdominal bruits or    hernia , no rebound or guarding.   Extremities: 1+ lower extremity edema bilaterally Neuro: Alert and oriented x 4 , grossly normal neurologically.  Skin: Warm and dry, no rash or jaundice.   Psych: Alert and cooperative, normal mood and affect.       Labs:  Lab Results  Component Value Date   WBC  5.6 07/04/2014   HGB 12.4 07/04/2014   HCT 37.1 07/04/2014   MCV 92.1 07/04/2014   PLT 124* 07/04/2014   Lab Results  Component Value Date   CREATININE 1.17* 04/11/2014   BUN 18 04/11/2014   NA 138 04/11/2014   K 3.2* 04/11/2014   CL 106 04/11/2014   CO2 28 04/11/2014   Lab Results  Component Value Date   ALT 18 04/11/2014   AST 19 04/11/2014   ALKPHOS 75 04/11/2014   BILITOT 0.5 04/11/2014    Imaging Studies: No results found.

## 2014-07-28 NOTE — Patient Instructions (Signed)
1. Please have your ultrasound done. 2. Stop omeprazole. Start pantoprazole once daily before breakfast. If no better in 10 days, then we can increase to twice daily before breakfast and evening meal. 3. Start amitiza with breakfast and evening meal. Take WITH food. 4. Return to the office in 4-6 weeks for follow up. Call sooner if no improvement in your symptoms.  5. For leg swelling, discuss with your family doctor. I will look for GI causes for edema with your ultrasound as discussed today.

## 2014-07-29 ENCOUNTER — Encounter (HOSPITAL_COMMUNITY): Payer: Self-pay | Admitting: Hematology & Oncology

## 2014-07-29 NOTE — Progress Notes (Unsigned)
Patient ID: Connie Ponce, female   DOB: May 15, 1945, 69 y.o.   MRN: [id number] Patient dose not need PA for US ( ref# for phone call is [id number])

## 2014-07-29 NOTE — Progress Notes (Signed)
[place] Progress Note  Patient Care Team: [name], NP as PCP - General (Nurse Practitioner) Daneil Dolin, MD (Gastroenterology)  CHIEF COMPLAINTS/PURPOSE OF CONSULTATION:  Thombocytopenia BMBX on 04/22/2014 Diagnosis Bone Marrow, Aspirate,Biopsy, and Clot, left iliac - HYPERCELLULAR BONE MARROW FOR AGE WITH TRILINEAGE HEMATOPOIESIS. - SEE COMMENT. PERIPHERAL BLOOD: - HYPERSEGMENTED NEUTROPHILS Diagnosis Note The bone marrow is hypercellular for age with trilineage hematopoiesis. This includes abundant megakaryocytes with predominantly normal morphology. The peripheral blood and bone marrow shows scattered hypersegmented neutrophils but with no other megaloblastoid changes or significant dyspoiesis identified. The appearance is nonspecific but correlation with serum vitamin B12 and Folate levels is recommended.   Normal B12 and folate 04/2014  HISTORY OF PRESENTING ILLNESS:  Connie Ponce 69 y.o. female is here because of low platelets. Bone marrow biopsy was performed that was unremarkable. She had evidence of hypersegmented neutrophils on peripheral blood and bone marrow, O84 and folic acid were normal.  She complains today of intermittent nausea accompanied by vomiting. Is not happen every day but it does cause her distress. She denies bleeding or bruising.  Laboratory studies on February 5 had completely normalized. Laboratory studies today show a mildly low platelet count at 124,000.  MEDICAL HISTORY:  Past Medical History  Diagnosis Date  . Hypertension   . Hyperlipidemia   . Hypothyroidism   . Anxiety   . Osteoarthritis   . COPD (chronic obstructive pulmonary disease)   . GERD (gastroesophageal reflux disease)   . Renal insufficiency   . Stroke 1990s    mild  . Pneumonia 2010  . Spinal stenosis 2010    lumbar  . Gastric ulcer     SURGICAL HISTORY: Past Surgical History  Procedure Laterality Date  . Cholecystectomy    .  Appendectomy    . Abdominal hysterectomy    . Cystectomy      cyst removed from rectal area.   . Esophagogastroduodenoscopy  01/05/2003    RMR: Normal esophagus, small hiatal hernia/ A couple of tiny antral erosions, otherwise normal stomach normal D1 and D2.   . Colonoscopy      ?2005, [place]  . Colonoscopy with esophagogastroduodenoscopy (egd)  02/11/2012    ZYS:AYTKZSW/[id number] reflux/HH/normal colon and rectum  . Back surgery    . Bone marrow biopsy Left 04/22/14  . Bone marrow aspiration Left 04/22/14    SOCIAL HISTORY: History   Social History  . Marital Status: Married    Spouse Name: N/A  . Number of Children: 3  . Years of Education: N/A   Occupational History  . Not on file.   Social History Main Topics  . Smoking status: Current Every Day Smoker -- 0.50 packs/day    Types: Cigarettes  . Smokeless tobacco: Never Used  . Alcohol Use: No  . Drug Use: No  . Sexual Activity: No   Other Topics Concern  . Not on file   Social History Narrative  She is married, for 34+ years. 3 children. 3 grandchildren. 3 great grandchildren. She smokes 1/2 ppd. She is trying to quit. She was in the [employer]. Born in [place] [employer] [place] [employer]. No alcohol abuse  FAMILY HISTORY: Family History  Problem Relation Age of Onset  . Colon cancer Neg Hx   . Liver disease Neg Hx   . Other Mother     died age 56, natural causes   52 Brothers, 54 Sisters. Only 1 sister living. One sister died from breast cancer, one sister died from MI,  brothers deceased from MI's. Mother deceased at 65 Father deceased in his 80's, patient was a year old.   ALLERGIES:  has No Known Allergies.  MEDICATIONS:  Current Outpatient Prescriptions  Medication Sig Dispense Refill  . aspirin EC 81 MG tablet Take 81 mg by mouth daily.    . budesonide-formoterol (SYMBICORT) 160-4.5 MCG/ACT inhaler Inhale 2 puffs into the lungs 2 (two) times daily as needed. Shortness of Breath    . Cholecalciferol  (VITAMIN D-3) 5000 UNITS TABS Take 1 tablet by mouth daily.    Marland Kitchen diltiazem (TIAZAC) 360 MG 24 hr capsule Take 360 mg by mouth daily.    Marland Kitchen levothyroxine (SYNTHROID, LEVOTHROID) 175 MCG tablet Take 175 mcg by mouth daily.    Marland Kitchen lisinopril (PRINIVIL,ZESTRIL) 40 MG tablet Take 40 mg by mouth daily.    . metoprolol tartrate (LOPRESSOR) 25 MG tablet Take 25 mg by mouth 2 (two) times daily.    Marland Kitchen oxyCODONE-acetaminophen (PERCOCET) 5-325 MG per tablet Take 1 tablet by mouth every 4 (four) hours as needed. 10 tablet 0  . pravastatin (PRAVACHOL) 40 MG tablet Take 40 mg by mouth daily.    . ranitidine (ZANTAC) 150 MG tablet Take 150 mg by mouth 2 (two) times daily.    . traZODone (DESYREL) 150 MG tablet Take 300 mg by mouth at bedtime.    . triamterene-hydrochlorothiazide (MAXZIDE-25) 37.5-25 MG per tablet Take 1 tablet by mouth daily.    Marland Kitchen lubiprostone (AMITIZA) 24 MCG capsule Take 1 capsule (24 mcg total) by mouth 2 (two) times daily with a meal. 60 capsule 5  . Omega-3 Fatty Acids (FISH OIL) 1000 MG CAPS Take 1 capsule by mouth 2 (two) times daily.    . pantoprazole (PROTONIX) 40 MG tablet Take 1 tablet (40 mg total) by mouth daily before breakfast. 30 tablet 3  . potassium chloride (K-DUR) 10 MEQ tablet      No current facility-administered medications for this visit.    Review of Systems  Constitutional: Positive for malaise/fatigue.  HENT: Negative.   Eyes: Negative.   Cardiovascular: Negative.   Gastrointestinal: Positive for nausea and vomiting.  Genitourinary: Positive for frequency.  Musculoskeletal: Positive for joint pain.  Skin: Negative.   Neurological: Negative.   Endo/Heme/Allergies: Negative.   Psychiatric/Behavioral: Negative.     PHYSICAL EXAMINATION: ECOG PERFORMANCE STATUS: 0 - Asymptomatic  Filed Vitals:   07/04/14 1253  BP: 137/64  Pulse: 64  Temp: 98.2 F (36.8 C)  Resp: 18   Filed Weights   07/04/14 1253  Weight: 221 lb 12.8 oz (100.608 kg)     Physical  Exam  Constitutional: She is oriented to person, place, and time and well-developed, well-nourished, and in no distress.  HENT:  Head: Normocephalic and atraumatic.  Nose: Nose normal.  Mouth/Throat: Oropharynx is clear and moist. No oropharyngeal exudate.  Eyes: Conjunctivae and EOM are normal. Pupils are equal, round, and reactive to light. Right eye exhibits no discharge. Left eye exhibits no discharge. No scleral icterus.  Neck: Normal range of motion. Neck supple. No tracheal deviation present. No thyromegaly present.  Cardiovascular: Normal rate, regular rhythm and normal heart sounds.  Exam reveals no gallop and no friction rub.   No murmur heard. Pulmonary/Chest: Effort normal and breath sounds normal. She has no wheezes. She has no rales.  Abdominal: Soft. Bowel sounds are normal. She exhibits no distension and no mass. There is no tenderness. There is no rebound and no guarding.  Musculoskeletal: Normal range of motion. She exhibits  no edema.  Lymphadenopathy:    She has no cervical adenopathy.  Neurological: She is alert and oriented to person, place, and time. She has normal reflexes. No cranial nerve deficit. Gait normal. Coordination normal.  Skin: Skin is warm and dry. No rash noted.  Psychiatric: Mood, memory, affect and judgment normal.  Nursing note and vitals reviewed.    LABORATORY DATA:  I have reviewed the data as listed Lab Results  Component Value Date   WBC 5.6 07/04/2014   HGB 12.4 07/04/2014   HCT 37.1 07/04/2014   MCV 92.1 07/04/2014   PLT 124* 07/04/2014     Chemistry      Component Value Date/Time   NA 138 04/11/2014 1648   NA 140 01/17/2012 1155   K 3.2* 04/11/2014 1648   K 3.7 01/17/2012 1155   CL 106 04/11/2014 1648   CO2 28 04/11/2014 1648   BUN 18 04/11/2014 1648   BUN 17 01/17/2012 1155   CREATININE 1.17* 04/11/2014 1648   CREATININE 1.32 01/17/2012 1155      Component Value Date/Time   CALCIUM 9.6 04/11/2014 1648   CALCIUM 10.0  01/17/2012 1155   ALKPHOS 75 04/11/2014 1648   ALKPHOS 92 01/17/2012 1155   AST 19 04/11/2014 1648   AST 12 01/17/2012 1155   ALT 18 04/11/2014 1648   BILITOT 0.5 04/11/2014 1648   BILITOT 0.2 01/17/2012 1155       ASSESSMENT & PLAN:   Thrombocytopenia  Her platelet count was reviewed with her today. It is mildly low. I explained to the patient that there is no indication for treatment of any kind at this point. Would continue with observation. We will plan on seeing her back in 6 months with repeat labs. If she develops any new bruising or bleeding such as gum bleeding or nose bleeding she is to call the clinic.   Intermittent nausea/vomiting  I will refer her back to Dr. Gala Romney as she has seen him in the past.   Orders Placed This Encounter  Procedures  . CBC with Differential    Standing Status: Future     Number of Occurrences:      Standing Expiration Date: 09/03/2015    All questions were answered. The patient knows to call the clinic with any problems, questions or concerns.  This note was signed electronically  Molli Hazard, MD  07/29/2014 9:38 AM

## 2014-08-02 ENCOUNTER — Encounter: Payer: Self-pay | Admitting: Gastroenterology

## 2014-08-02 NOTE — Assessment & Plan Note (Signed)
Complains of chronic lower extremity edema. Advised patient that we will rule out chronic liver disease that can contribute to lower extremity edema when we get her ultrasound for other reasons. If no evidence of chronic liver disease, recommend her following up with her PCP for her lower extremity edema. Discussed low sodium diet.

## 2014-08-02 NOTE — Assessment & Plan Note (Signed)
Nausea and vomiting associated with left upper quadrant pain. Refractory GERD. EGD in 2013 with ulcerative/erosive reflux esophagitis, single gastric ulcer with benign biopsy. She did not have follow-up EGD to verify healing, looks like this was not recommended at the time.  At this point, we will switch PPIs to see if any improvement of her symptoms. Abdominal ultrasound to further evaluate her symptoms as well as rule out splenomegaly given thrombocytopenia. Need to rule out chronic liver disease. Based on findings, she will likely need to have an upper endoscopy as next step to evaluate her symptoms unless symptoms completely resolved with PPI therapy. She has a follow-up in 4-6 weeks to reevaluate.

## 2014-08-02 NOTE — Assessment & Plan Note (Signed)
Chronic constipation. She reports limited narcotic use for spinal stenosis. Trial of Amitiza 24 g twice a day with food

## 2014-08-03 ENCOUNTER — Ambulatory Visit (HOSPITAL_COMMUNITY)
Admission: RE | Admit: 2014-08-03 | Discharge: 2014-08-03 | Disposition: A | Discharge status: Home / Self Care | Payer: Medicare HMO | Source: Ambulatory Visit | Attending: Gastroenterology | Admitting: Gastroenterology

## 2014-08-03 DIAGNOSIS — K760 Fatty (change of) liver, not elsewhere classified: Secondary | ICD-10-CM | POA: Diagnosis not present

## 2014-08-03 DIAGNOSIS — D759 Disease of blood and blood-forming organs, unspecified: Secondary | ICD-10-CM | POA: Insufficient documentation

## 2014-08-03 DIAGNOSIS — D696 Thrombocytopenia, unspecified: Secondary | ICD-10-CM

## 2014-08-03 DIAGNOSIS — K5900 Constipation, unspecified: Secondary | ICD-10-CM

## 2014-08-03 DIAGNOSIS — R112 Nausea with vomiting, unspecified: Secondary | ICD-10-CM | POA: Diagnosis not present

## 2014-08-03 DIAGNOSIS — K2100 Gastro-esophageal reflux disease with esophagitis, without bleeding: Secondary | ICD-10-CM

## 2014-08-03 DIAGNOSIS — R600 Localized edema: Secondary | ICD-10-CM

## 2014-08-03 DIAGNOSIS — R1012 Left upper quadrant pain: Secondary | ICD-10-CM | POA: Insufficient documentation

## 2014-08-03 NOTE — Progress Notes (Signed)
cc'ed to pcp °

## 2014-08-03 NOTE — Progress Notes (Signed)
Quick Note:  Please let patient know that her u/s did not explain her pain and specifically her liver and spleen look normal. Lower extremity edema not related to the liver. She should follow up with PCP for this.  For ugi symptoms, we can offer her an EGD now or she can continue new PPI and follow up at upcoming OV to determine if EGD needed. ______

## 2014-08-04 ENCOUNTER — Inpatient Hospital Stay (HOSPITAL_COMMUNITY): Admission: RE | Admit: 2014-08-04 | Payer: Medicare HMO | Source: Ambulatory Visit

## 2014-08-09 ENCOUNTER — Other Ambulatory Visit: Payer: Self-pay

## 2014-08-09 DIAGNOSIS — R112 Nausea with vomiting, unspecified: Secondary | ICD-10-CM

## 2014-08-11 ENCOUNTER — Other Ambulatory Visit (HOSPITAL_COMMUNITY): Payer: Self-pay

## 2014-08-11 ENCOUNTER — Encounter (HOSPITAL_COMMUNITY): Payer: Self-pay | Admitting: *Deleted

## 2014-08-11 ENCOUNTER — Encounter (HOSPITAL_COMMUNITY)
Admission: RE | Disposition: A | Discharge status: Home / Self Care | Payer: Self-pay | Source: Ambulatory Visit | Attending: Internal Medicine

## 2014-08-11 ENCOUNTER — Ambulatory Visit (HOSPITAL_COMMUNITY)
Admission: RE | Admit: 2014-08-11 | Discharge: 2014-08-11 | Disposition: A | Discharge status: Home / Self Care | Payer: Medicare HMO | Source: Ambulatory Visit | Attending: Internal Medicine | Admitting: Internal Medicine

## 2014-08-11 DIAGNOSIS — M4806 Spinal stenosis, lumbar region: Secondary | ICD-10-CM | POA: Insufficient documentation

## 2014-08-11 DIAGNOSIS — Z79899 Other long term (current) drug therapy: Secondary | ICD-10-CM | POA: Diagnosis not present

## 2014-08-11 DIAGNOSIS — J449 Chronic obstructive pulmonary disease, unspecified: Secondary | ICD-10-CM | POA: Insufficient documentation

## 2014-08-11 DIAGNOSIS — K315 Obstruction of duodenum: Secondary | ICD-10-CM | POA: Insufficient documentation

## 2014-08-11 DIAGNOSIS — Z90710 Acquired absence of both cervix and uterus: Secondary | ICD-10-CM | POA: Diagnosis not present

## 2014-08-11 DIAGNOSIS — R1013 Epigastric pain: Secondary | ICD-10-CM | POA: Diagnosis present

## 2014-08-11 DIAGNOSIS — E785 Hyperlipidemia, unspecified: Secondary | ICD-10-CM | POA: Insufficient documentation

## 2014-08-11 DIAGNOSIS — K449 Diaphragmatic hernia without obstruction or gangrene: Secondary | ICD-10-CM | POA: Insufficient documentation

## 2014-08-11 DIAGNOSIS — E039 Hypothyroidism, unspecified: Secondary | ICD-10-CM | POA: Diagnosis not present

## 2014-08-11 DIAGNOSIS — Z7982 Long term (current) use of aspirin: Secondary | ICD-10-CM | POA: Diagnosis not present

## 2014-08-11 DIAGNOSIS — Z8673 Personal history of transient ischemic attack (TIA), and cerebral infarction without residual deficits: Secondary | ICD-10-CM | POA: Diagnosis not present

## 2014-08-11 DIAGNOSIS — F419 Anxiety disorder, unspecified: Secondary | ICD-10-CM | POA: Insufficient documentation

## 2014-08-11 DIAGNOSIS — Z9049 Acquired absence of other specified parts of digestive tract: Secondary | ICD-10-CM | POA: Diagnosis not present

## 2014-08-11 DIAGNOSIS — M1990 Unspecified osteoarthritis, unspecified site: Secondary | ICD-10-CM | POA: Diagnosis not present

## 2014-08-11 DIAGNOSIS — F17210 Nicotine dependence, cigarettes, uncomplicated: Secondary | ICD-10-CM | POA: Diagnosis not present

## 2014-08-11 DIAGNOSIS — I10 Essential (primary) hypertension: Secondary | ICD-10-CM | POA: Insufficient documentation

## 2014-08-11 DIAGNOSIS — K219 Gastro-esophageal reflux disease without esophagitis: Secondary | ICD-10-CM | POA: Insufficient documentation

## 2014-08-11 DIAGNOSIS — K259 Gastric ulcer, unspecified as acute or chronic, without hemorrhage or perforation: Secondary | ICD-10-CM | POA: Diagnosis not present

## 2014-08-11 DIAGNOSIS — R112 Nausea with vomiting, unspecified: Secondary | ICD-10-CM

## 2014-08-11 DIAGNOSIS — D696 Thrombocytopenia, unspecified: Secondary | ICD-10-CM

## 2014-08-11 DIAGNOSIS — Z8711 Personal history of peptic ulcer disease: Secondary | ICD-10-CM | POA: Diagnosis present

## 2014-08-11 HISTORY — PX: ESOPHAGOGASTRODUODENOSCOPY: SHX5428

## 2014-08-11 SURGERY — EGD (ESOPHAGOGASTRODUODENOSCOPY)
Anesthesia: Moderate Sedation

## 2014-08-11 MED ORDER — MEPERIDINE HCL 100 MG/ML IJ SOLN
INTRAMUSCULAR | Status: DC | PRN
  Administered 2014-08-11: 25 mg via INTRAVENOUS
  Administered 2014-08-11: 50 mg via INTRAVENOUS

## 2014-08-11 MED ORDER — MIDAZOLAM HCL 5 MG/5ML IJ SOLN
INTRAMUSCULAR | Status: DC | PRN
  Administered 2014-08-11 (×2): 2 mg via INTRAVENOUS

## 2014-08-11 MED ORDER — LIDOCAINE VISCOUS 2 % MT SOLN
OROMUCOSAL | Status: DC | PRN
  Administered 2014-08-11: 3 mL via OROMUCOSAL

## 2014-08-11 MED ORDER — MEPERIDINE HCL 100 MG/ML IJ SOLN
INTRAMUSCULAR | Status: DC
Start: 2014-08-11 — End: 2014-08-11
  Filled 2014-08-11: qty 2

## 2014-08-11 MED ORDER — LIDOCAINE VISCOUS 2 % MT SOLN
OROMUCOSAL | Status: AC
  Filled 2014-08-11: qty 15

## 2014-08-11 MED ORDER — STERILE WATER FOR IRRIGATION IR SOLN
Status: DC | PRN
  Administered 2014-08-11: 11:00:00

## 2014-08-11 MED ORDER — MIDAZOLAM HCL 5 MG/5ML IJ SOLN
INTRAMUSCULAR | Status: DC
Start: 2014-08-11 — End: 2014-08-11
  Filled 2014-08-11: qty 10

## 2014-08-11 MED ORDER — ONDANSETRON HCL 4 MG/2ML IJ SOLN
INTRAMUSCULAR | Status: DC | PRN
  Administered 2014-08-11: 4 mg via INTRAVENOUS

## 2014-08-11 MED ORDER — SODIUM CHLORIDE 0.9 % IV SOLN
INTRAVENOUS | Status: DC
  Administered 2014-08-11: 11:00:00 via INTRAVENOUS

## 2014-08-11 MED ORDER — ONDANSETRON HCL 4 MG/2ML IJ SOLN
INTRAMUSCULAR | Status: AC
  Filled 2014-08-11: qty 2

## 2014-08-11 NOTE — Discharge Instructions (Signed)
EGD Discharge instructions Please read the instructions outlined below and refer to this sheet in the next few weeks. These discharge instructions provide you with general information on caring for yourself after you leave the hospital. Your doctor may also give you specific instructions. While your treatment has been planned according to the most current medical practices available, unavoidable complications occasionally occur. If you have any problems or questions after discharge, please call your doctor. ACTIVITY  You may resume your regular activity but move at a slower pace for the next 24 hours.   Take frequent rest periods for the next 24 hours.   Walking will help expel (get rid of) the air and reduce the bloated feeling in your abdomen.   No driving for 24 hours (because of the anesthesia (medicine) used during the test).   You may shower.   Do not sign any important legal documents or operate any machinery for 24 hours (because of the anesthesia used during the test).  NUTRITION  Drink plenty of fluids.   You may resume your normal diet.   Begin with a light meal and progress to your normal diet.   Avoid alcoholic beverages for 24 hours or as instructed by your caregiver.  MEDICATIONS  You may resume your normal medications unless your caregiver tells you otherwise.  WHAT YOU CAN EXPECT TODAY  You may experience abdominal discomfort such as a feeling of fullness or gas pains.  FOLLOW-UP  Your doctor will discuss the results of your test with you.  SEEK IMMEDIATE MEDICAL ATTENTION IF ANY OF THE FOLLOWING OCCUR:  Excessive nausea (feeling sick to your stomach) and/or vomiting.   Severe abdominal pain and distention (swelling).   Trouble swallowing.   Temperature over 101 F (37.8 C).   Rectal bleeding or vomiting of blood.     GERD and peptic ulcer information provided  Blood drawn for fasting gastrin level today  Increase Protonix to 40 mg twice  daily  Avoid all NSAID agents like aspirin and ibuprofen  Further recommendations to follow.  Office visit with [place] in 3 months   Gastroesophageal Reflux Disease, Adult Gastroesophageal reflux disease (GERD) happens when acid from your stomach flows up into the esophagus. When acid comes in contact with the esophagus, the acid causes soreness (inflammation) in the esophagus. Over time, GERD may create small holes (ulcers) in the lining of the esophagus. CAUSES   Increased body weight. This puts pressure on the stomach, making acid rise from the stomach into the esophagus.  Smoking. This increases acid production in the stomach.  Drinking alcohol. This causes decreased pressure in the lower esophageal sphincter (valve or ring of muscle between the esophagus and stomach), allowing acid from the stomach into the esophagus.  Late evening meals and a full stomach. This increases pressure and acid production in the stomach.  A malformed lower esophageal sphincter. Sometimes, no cause is found. SYMPTOMS   Burning pain in the lower part of the mid-chest behind the breastbone and in the mid-stomach area. This may occur twice a week or more often.  Trouble swallowing.  Sore throat.  Dry cough.  Asthma-like symptoms including chest tightness, shortness of breath, or wheezing. DIAGNOSIS  Your caregiver may be able to diagnose GERD based on your symptoms. In some cases, X-rays and other tests may be done to check for complications or to check the condition of your stomach and esophagus. TREATMENT  Your caregiver may recommend over-the-counter or prescription medicines to help decrease acid production.  Ask your caregiver before starting or adding any new medicines.  HOME CARE INSTRUCTIONS   Change the factors that you can control. Ask your caregiver for guidance concerning weight loss, quitting smoking, and alcohol consumption.  Avoid foods and drinks that make your symptoms worse, such  as:  Caffeine or alcoholic drinks.  Chocolate.  Peppermint or mint flavorings.  Garlic and onions.  Spicy foods.  Citrus fruits, such as oranges, lemons, or limes.  Tomato-based foods such as sauce, chili, salsa, and pizza.  Fried and fatty foods.  Avoid lying down for the 3 hours prior to your bedtime or prior to taking a nap.  Eat small, frequent meals instead of large meals.  Wear loose-fitting clothing. Do not wear anything tight around your waist that causes pressure on your stomach.  Raise the head of your bed 6 to 8 inches with wood blocks to help you sleep. Extra pillows will not help.  Only take over-the-counter or prescription medicines for pain, discomfort, or fever as directed by your caregiver.  Do not take aspirin, ibuprofen, or other nonsteroidal anti-inflammatory drugs (NSAIDs). SEEK IMMEDIATE MEDICAL CARE IF:   You have pain in your arms, neck, jaw, teeth, or back.  Your pain increases or changes in intensity or duration.  You develop nausea, vomiting, or sweating (diaphoresis).  You develop shortness of breath, or you faint.  Your vomit is green, yellow, black, or looks like coffee grounds or blood.  Your stool is red, bloody, or black. These symptoms could be signs of other problems, such as heart disease, gastric bleeding, or esophageal bleeding. MAKE SURE YOU:   Understand these instructions.  Will watch your condition.  Will get help right away if you are not doing well or get worse. Document Released: 12/12/2004 Document Revised: 05/27/2011 Document Reviewed: 09/21/2010 [facility] Patient Information 2015 [place]. This information is not intended to replace advice given to you by your health care provider. Make sure you discuss any questions you have with your health care provider.     Peptic Ulcer A peptic ulcer is a sore in the lining of your esophagus (esophageal ulcer), stomach (gastric ulcer), or in the first part of your  small intestine (duodenal ulcer). The ulcer causes erosion into the deeper tissue. CAUSES  Normally, the lining of the stomach and the small intestine protects itself from the acid that digests food. The protective lining can be damaged by:  An infection caused by a bacterium called Helicobacter pylori (H. pylori).  Regular use of nonsteroidal anti-inflammatory drugs (NSAIDs), such as ibuprofen or aspirin.  Smoking tobacco. Other risk factors include being older than 50, drinking alcohol excessively, and having a family history of ulcer disease.  SYMPTOMS   Burning pain or gnawing in the area between the chest and the belly button.  Heartburn.  Nausea and vomiting.  Bloating. The pain can be worse on an empty stomach and at night. If the ulcer results in bleeding, it can cause:  Black, tarry stools.  Vomiting of bright red blood.  Vomiting of coffee-ground-looking materials. DIAGNOSIS  A diagnosis is usually made based upon your history and an exam. Other tests and procedures may be performed to find the cause of the ulcer. Finding a cause will help determine the best treatment. Tests and procedures may include:  Blood tests, stool tests, or breath tests to check for the bacterium H. pylori.  An upper gastrointestinal (GI) series of the esophagus, stomach, and small intestine.  An endoscopy to examine the  esophagus, stomach, and small intestine.  A biopsy. TREATMENT  Treatment may include:  Eliminating the cause of the ulcer, such as smoking, NSAIDs, or alcohol.  Medicines to reduce the amount of acid in your digestive tract.  Antibiotic medicines if the ulcer is caused by the H. pylori bacterium.  An upper endoscopy to treat a bleeding ulcer.  Surgery if the bleeding is severe or if the ulcer created a hole somewhere in the digestive system. HOME CARE INSTRUCTIONS   Avoid tobacco, alcohol, and caffeine. Smoking can increase the acid in the stomach, and continued  smoking will impair the healing of ulcers.  Avoid foods and drinks that seem to cause discomfort or aggravate your ulcer.  Only take medicines as directed by your caregiver. Do not substitute over-the-counter medicines for prescription medicines without talking to your caregiver.  Keep any follow-up appointments and tests as directed. SEEK MEDICAL CARE IF:   Your do not improve within 7 days of starting treatment.  You have ongoing indigestion or heartburn. SEEK IMMEDIATE MEDICAL CARE IF:   You have sudden, sharp, or persistent abdominal pain.  You have bloody or dark black, tarry stools.  You vomit blood or vomit that looks like coffee grounds.  You become light-headed, weak, or feel faint.  You become sweaty or clammy. MAKE SURE YOU:   Understand these instructions.  Will watch your condition.  Will get help right away if you are not doing well or get worse. Document Released: 03/01/2000 Document Revised: 07/19/2013 Document Reviewed: 10/02/2011 [facility] Patient Information 2015 [place], [place]. This information is not intended to replace advice given to you by your health care provider. Make sure you discuss any questions you have with your health care provider.

## 2014-08-11 NOTE — Op Note (Signed)
[facility] [address] [place], [id number]   ENDOSCOPY PROCEDURE REPORT  PATIENT: Connie Ponce, Connie Ponce  MR#: [id number] BIRTHDATE: 01/28/1946 , 69  yrs. old GENDER: female ENDOSCOPIST: R.  Roetta SessionsMichael Nikisha Fleece, MD FACP [facility] REFERRED BY:  Cheron EveryLindsey Strader, NP PROCEDURE DATE:  08/11/2014 PROCEDURE:  EGD w/ biopsy INDICATIONS:  Recurrent epigastric pain; history of peptic ulcer disease. MEDICATIONS: Versed 4 mg IV and Demerol 75 mg IV in divided doses. Zofran 4 mg IV.  Xylocaine gel orally. ASA CLASS:      Class II  CONSENT: The risks, benefits, limitations, alternatives and imponderables have been discussed.  The potential for biopsy, esophogeal dilation, etc. have also been reviewed.  Questions have been answered.  All parties agreeable.  Please see the history and physical in the medical record for more information.  DESCRIPTION OF PROCEDURE: After the risks benefits and alternatives of the procedure were thoroughly explained, informed consent was obtained.  The [id number]-[id number] ([id number] endoscope was introduced through the mouth and advanced to the second portion of the duodenum , limited by Without limitations. The instrument was slowly withdrawn as the mucosa was fully examined.    Normal-appearing tubular esophagus.  Stomach empty.  Elliptical prepyloric antral ulcer appeared somewhat larger than that previously seen.  This does not appear to be an obviously neoplastic process.  Satellite erosions were present as well.  The patient also had a hiatal hernia.  Pylorus patent and easily traversed.  At the junction between the first and second portion of the duodenum there were erosions and mild stenosis of the lumen at this level.  I was able to advance the scope down into the second portion of the duodenum (which appeared normal) without any difficulty.  Multiple biopsies of the gastric ulcer taken for histologic study. Retroflexed views revealed a hiatal hernia.      The scope was then withdrawn from the patient and the procedure completed.  COMPLICATIONS: There were no immediate complications.  ENDOSCOPIC IMPRESSION: Persisting (somewhat enlarging) gastric ulcer with satellite erosions. Status post gastric ulcer biopsy. Hiatal hernia. Posterior bulbar duodenal erosions and secondary stenosis.  I do not get a history of any NSAID ingestion upon interviewing patient and family.  RECOMMENDATIONS: Fasting serum gastrin level today. Increase Protonix to 40 mg twice daily. Follow-up on pathology. Office visit with us in 3 months.  REPEAT EXAM:  eSigned:  R. Roetta SessionsMichael Topaz Raglin, MD Jerrel IvoryFACP [facility] 08/11/2014 11:57 AM    CC:  CPT CODES: ICD CODES:  The ICD and CPT codes recommended by this software are interpretations from the data that the clinical staff has captured with the software.  The verification of the translation of this report to the ICD and CPT codes and modifiers is the sole responsibility of the health care institution and practicing physician where this report was generated.  [employer] will not be held responsible for the validity of the ICD and CPT codes included on this report.  AMA assumes no liability for data contained or not contained herein. CPT is a Publishing rights managerregistered trademark of the [employer].  PATIENT NAME:  Connie Ponce, Connie Ponce MR#: [id number]

## 2014-08-11 NOTE — Interval H&P Note (Signed)
History and Physical Interval Note:  08/11/2014 11:26 AM  Connie Ponce  has presented today for surgery, with the diagnosis of NAUSEA/VOMITING/ABD PAIN  The various methods of treatment have been discussed with the patient and family. After consideration of risks, benefits and other options for treatment, the patient has consented to  Procedure(s) with comments: ESOPHAGOGASTRODUODENOSCOPY (EGD) (N/A) - 200 - moved to 11:00 - pt knows to arrive at 10:00  as a surgical intervention .  The patient's history has been reviewed, patient examined, no change in status, stable for surgery.  I have reviewed the patient's chart and labs.  Questions were answered to the patient's satisfaction.     Vicent Febles  No change in symptoms with Protonix 40 mg daily. Diagnostic EGD today per plan. The risks, benefits, limitations, alternatives and imponderables have been reviewed with the patient. Potential for esophageal dilation, biopsy, etc. have also been reviewed.  Questions have been answered. All parties agreeable.

## 2014-08-11 NOTE — H&P (View-Only) (Signed)
Primary Care Physician: [name], NP  Primary Gastroenterologist:  Garfield Cornea, MD Referring provider: Dr. Whitney Muse  Chief Complaint  Patient presents with  . Nausea  . Emesis    HPI: Connie Ponce is a 69 y.o. female here for further evaluation of nausea and vomiting at the request of Dr. Whitney Muse. She sees Dr. Whitney Muse for thrombocytopenia.  We last saw the patient back in 2013 for abdominal pain and constipation. EGD showed circumferential distal esophageal erosions within 5 mm at the GE junction, one tiny ulcer at this level. No Barrett's. 3 cm hiatal hernia. 1.5 cm x 4 mm elongated ulcer on a fold in the prepyloric/antral mucosa., Biopsy benign, no H pylori. Screening colonoscopy at that time showed a redundant, elongated colon but otherwise unremarkable.  Patient states that her heartburn is not controlled. She's been worse for a few months. Describes heartburn as severe. Associated with vomiting at times but has frequent nausea. Associated with upper abdominal discomfort. Denies any weight loss. Previously on omeprazole 20 mg daily without relief. Also takes ranitidine 150 mg twice daily. Is to be helping. She denies any hematemesis. Previously was on Prevacid but insurance would not pay for it.. Denies dysphagia.  Remains constipated. Has a bowel movement about 3-4 times per week. [place] stool 1 at times. Denies melena rectal bleeding. She did not like taking MiraLAX, quoting side effects and didn't seem to help a lot. She takes pain medication at times for her chronic back pain related to spinal stenosis. Does not take every day.  Complains of chronic lower extremity edema.   Current Outpatient Prescriptions  Medication Sig Dispense Refill  . aspirin EC 81 MG tablet Take 81 mg by mouth daily.    . budesonide-formoterol (SYMBICORT) 160-4.5 MCG/ACT inhaler Inhale 2 puffs into the lungs 2 (two) times daily as needed. Shortness of Breath    . Cholecalciferol (VITAMIN  D-3) 5000 UNITS TABS Take 1 tablet by mouth daily.    Marland Kitchen diltiazem (TIAZAC) 360 MG 24 hr capsule Take 360 mg by mouth daily.    Marland Kitchen levothyroxine (SYNTHROID, LEVOTHROID) 175 MCG tablet Take 175 mcg by mouth daily.    Marland Kitchen lisinopril (PRINIVIL,ZESTRIL) 40 MG tablet Take 40 mg by mouth daily.    . metoprolol tartrate (LOPRESSOR) 25 MG tablet Take 25 mg by mouth 2 (two) times daily.    . Omega-3 Fatty Acids (FISH OIL) 1000 MG CAPS Take 1 capsule by mouth 2 (two) times daily.    Marland Kitchen omeprazole (PRILOSEC) 20 MG capsule Take 20 mg by mouth daily.    Marland Kitchen oxyCODONE-acetaminophen (PERCOCET) 5-325 MG per tablet Take 1 tablet by mouth every 4 (four) hours as needed. 10 tablet 0  . potassium chloride (K-DUR) 10 MEQ tablet     . pravastatin (PRAVACHOL) 40 MG tablet Take 40 mg by mouth daily.    . ranitidine (ZANTAC) 150 MG tablet Take 150 mg by mouth 2 (two) times daily.    . traZODone (DESYREL) 150 MG tablet Take 300 mg by mouth at bedtime.    . triamterene-hydrochlorothiazide (MAXZIDE-25) 37.5-25 MG per tablet Take 1 tablet by mouth daily.     No current facility-administered medications for this visit.    Allergies as of 07/28/2014  . (No Known Allergies)   Past Medical History  Diagnosis Date  . Hypertension   . Hyperlipidemia   . Hypothyroidism   . Anxiety   . Osteoarthritis   . COPD (chronic obstructive pulmonary disease)   .  GERD (gastroesophageal reflux disease)   . Renal insufficiency   . Stroke 1990s    mild  . Pneumonia 2010  . Spinal stenosis 2010    lumbar  . Gastric ulcer    Past Surgical History  Procedure Laterality Date  . Cholecystectomy    . Appendectomy    . Abdominal hysterectomy    . Cystectomy      cyst removed from rectal area.   . Esophagogastroduodenoscopy  01/05/2003    RMR: Normal esophagus, small hiatal hernia/ A couple of tiny antral erosions, otherwise normal stomach normal D1 and D2.   . Colonoscopy      ?2005, [place]  . Colonoscopy with  esophagogastroduodenoscopy (egd)  02/11/2012    RMR: Ulcerative/erosive reflux esophagitis, benign appearing gastric ulcer with unremarkable biopsy, no H pylori. Colonoscopy was unremarkable. Next colonoscopy in 2023  . Back surgery    . Bone marrow biopsy Left 04/22/14  . Bone marrow aspiration Left 04/22/14   Family History  Problem Relation Age of Onset  . Colon cancer Neg Hx   . Liver disease Neg Hx   . Other Mother     died age 69, natural causes   History   Social History  . Marital Status: Married    Spouse Name: N/A  . Number of Children: 3  . Years of Education: N/A   Social History Main Topics  . Smoking status: Current Every Day Smoker -- 0.50 packs/day    Types: Cigarettes  . Smokeless tobacco: Never Used  . Alcohol Use: No  . Drug Use: No  . Sexual Activity: No   Other Topics Concern  . None   Social History Narrative     ROS:  General: Negative for anorexia, weight loss, fever, chills, fatigue, weakness. Eyes: Negative for vision changes.  ENT: Negative for hoarseness, difficulty swallowing , nasal congestion. CV: Negative for chest pain, angina, palpitations, dyspnea on exertion. + peripheral edema.  Respiratory: Negative for dyspnea at rest, dyspnea on exertion, cough, sputum, wheezing.  GI: See history of present illness. GU:  Negative for dysuria, hematuria, urinary incontinence, urinary frequency, nocturnal urination.  MS: Negative for joint pain. + low back pain.  Derm: Negative for rash or itching.  Neuro: Negative for weakness, abnormal sensation, seizure, frequent headaches, memory loss, confusion.  Psych: Negative for anxiety, depression, suicidal ideation, hallucinations.  Endo: Negative for unusual weight change.  Heme: Negative for bruising or bleeding. Allergy: Negative for rash or hives.   Physical Examination:   BP 140/84 mmHg  Pulse 64  Temp(Src) 98.5 F (36.9 C)  Ht _0  (1.651 m)  Wt 224 lb 6.4 oz (101.787 kg)  BMI 37.34  kg/m2  Physical Examination:   General: Well-nourished, well-developed in no acute distress.  Head: Normocephalic, atraumatic.   Eyes: Conjunctiva pink, no icterus. Mouth: Oropharyngeal mucosa moist and pink , no lesions erythema or exudate. Neck: Supple without thyromegaly, masses, or lymphadenopathy.  Lungs: Clear to auscultation bilaterally.  Heart: Regular rate and rhythm, no murmurs rubs or gallops.  Abdomen: Bowel sounds are normal, nontender, obese, no hepatosplenomegaly or masses, no abdominal bruits or    hernia , no rebound or guarding.   Extremities: 1+ lower extremity edema bilaterally Neuro: Alert and oriented x 4 , grossly normal neurologically.  Skin: Warm and dry, no rash or jaundice.   Psych: Alert and cooperative, normal mood and affect.       Labs:  Lab Results  Component Value Date   WBC  5.6 07/04/2014   HGB 12.4 07/04/2014   HCT 37.1 07/04/2014   MCV 92.1 07/04/2014   PLT 124* 07/04/2014   Lab Results  Component Value Date   CREATININE 1.17* 04/11/2014   BUN 18 04/11/2014   NA 138 04/11/2014   K 3.2* 04/11/2014   CL 106 04/11/2014   CO2 28 04/11/2014   Lab Results  Component Value Date   ALT 18 04/11/2014   AST 19 04/11/2014   ALKPHOS 75 04/11/2014   BILITOT 0.5 04/11/2014    Imaging Studies: No results found.

## 2014-08-12 ENCOUNTER — Encounter (HOSPITAL_COMMUNITY): Payer: Self-pay | Admitting: Internal Medicine

## 2014-08-12 LAB — GASTRIN: Gastrin: 119 pg/mL — ABNORMAL HIGH (ref 0–115)

## 2014-08-16 ENCOUNTER — Encounter (HOSPITAL_COMMUNITY): Payer: Medicare HMO | Attending: Hematology & Oncology

## 2014-08-16 DIAGNOSIS — D696 Thrombocytopenia, unspecified: Secondary | ICD-10-CM | POA: Insufficient documentation

## 2014-08-16 LAB — CBC WITH DIFFERENTIAL/PLATELET
Basophils Absolute: 0.1 10*3/uL (ref 0.0–0.1)
Basophils Relative: 1 % (ref 0–1)
EOS PCT: 10 % — AB (ref 0–5)
Eosinophils Absolute: 0.5 10*3/uL (ref 0.0–0.7)
HEMATOCRIT: 37.9 % (ref 36.0–46.0)
HEMOGLOBIN: 12.5 g/dL (ref 12.0–15.0)
LYMPHS PCT: 37 % (ref 12–46)
Lymphs Abs: 1.9 10*3/uL (ref 0.7–4.0)
MCH: 30 pg (ref 26.0–34.0)
MCHC: 33 g/dL (ref 30.0–36.0)
MCV: 90.9 fL (ref 78.0–100.0)
MONO ABS: 0.4 10*3/uL (ref 0.1–1.0)
MONOS PCT: 8 % (ref 3–12)
Neutro Abs: 2.3 10*3/uL (ref 1.7–7.7)
Neutrophils Relative %: 44 % (ref 43–77)
Platelets: 157 10*3/uL (ref 150–400)
RBC: 4.17 MIL/uL (ref 3.87–5.11)
RDW: 11.9 % (ref 11.5–15.5)
WBC: 5.2 10*3/uL (ref 4.0–10.5)

## 2014-08-16 NOTE — Progress Notes (Signed)
Labs drawn

## 2014-08-17 ENCOUNTER — Encounter: Payer: Self-pay | Admitting: Internal Medicine

## 2014-08-17 ENCOUNTER — Telehealth: Payer: Self-pay

## 2014-08-17 NOTE — Telephone Encounter (Signed)
Letter mailed to the pt. 

## 2014-08-17 NOTE — Telephone Encounter (Signed)
Per RMR- Send letter to patient.  Send copy of letter with path to referring provider and PCP.   Patient needs an office visit with extender in 12 weeks. Will need a repeat EGD

## 2014-08-22 NOTE — Telephone Encounter (Signed)
OV MADE °

## 2014-09-18 ENCOUNTER — Inpatient Hospital Stay (HOSPITAL_COMMUNITY)
Admission: EM | Admit: 2014-09-18 | Discharge: 2014-09-21 | DRG: 871 | Disposition: A | Discharge status: Home / Self Care | Payer: Medicare HMO | Attending: Internal Medicine | Admitting: Internal Medicine

## 2014-09-18 DIAGNOSIS — I129 Hypertensive chronic kidney disease with stage 1 through stage 4 chronic kidney disease, or unspecified chronic kidney disease: Secondary | ICD-10-CM | POA: Diagnosis present

## 2014-09-18 DIAGNOSIS — N179 Acute kidney failure, unspecified: Secondary | ICD-10-CM | POA: Insufficient documentation

## 2014-09-18 DIAGNOSIS — E039 Hypothyroidism, unspecified: Secondary | ICD-10-CM | POA: Diagnosis present

## 2014-09-18 DIAGNOSIS — D696 Thrombocytopenia, unspecified: Secondary | ICD-10-CM | POA: Diagnosis present

## 2014-09-18 DIAGNOSIS — E785 Hyperlipidemia, unspecified: Secondary | ICD-10-CM | POA: Diagnosis present

## 2014-09-18 DIAGNOSIS — A419 Sepsis, unspecified organism: Principal | ICD-10-CM | POA: Diagnosis present

## 2014-09-18 DIAGNOSIS — G92 Toxic encephalopathy: Secondary | ICD-10-CM | POA: Diagnosis present

## 2014-09-18 DIAGNOSIS — E860 Dehydration: Secondary | ICD-10-CM | POA: Diagnosis present

## 2014-09-18 DIAGNOSIS — Z8673 Personal history of transient ischemic attack (TIA), and cerebral infarction without residual deficits: Secondary | ICD-10-CM

## 2014-09-18 DIAGNOSIS — R112 Nausea with vomiting, unspecified: Secondary | ICD-10-CM

## 2014-09-18 DIAGNOSIS — Z9049 Acquired absence of other specified parts of digestive tract: Secondary | ICD-10-CM | POA: Diagnosis present

## 2014-09-18 DIAGNOSIS — R509 Fever, unspecified: Secondary | ICD-10-CM | POA: Diagnosis not present

## 2014-09-18 DIAGNOSIS — M1990 Unspecified osteoarthritis, unspecified site: Secondary | ICD-10-CM | POA: Diagnosis present

## 2014-09-18 DIAGNOSIS — R197 Diarrhea, unspecified: Secondary | ICD-10-CM

## 2014-09-18 DIAGNOSIS — F17210 Nicotine dependence, cigarettes, uncomplicated: Secondary | ICD-10-CM | POA: Diagnosis present

## 2014-09-18 DIAGNOSIS — N189 Chronic kidney disease, unspecified: Secondary | ICD-10-CM | POA: Diagnosis present

## 2014-09-18 DIAGNOSIS — J449 Chronic obstructive pulmonary disease, unspecified: Secondary | ICD-10-CM | POA: Diagnosis present

## 2014-09-18 DIAGNOSIS — N390 Urinary tract infection, site not specified: Secondary | ICD-10-CM | POA: Diagnosis present

## 2014-09-18 DIAGNOSIS — G928 Other toxic encephalopathy: Secondary | ICD-10-CM

## 2014-09-18 NOTE — ED Notes (Signed)
Patient reports chills, fever, and generalized body aches. Husband also reports patient seems to be disoriented as well. Husband reports patient was vomiting this morning.

## 2014-09-19 ENCOUNTER — Emergency Department (HOSPITAL_COMMUNITY): Payer: Medicare HMO

## 2014-09-19 ENCOUNTER — Encounter (HOSPITAL_COMMUNITY): Payer: Self-pay | Admitting: *Deleted

## 2014-09-19 DIAGNOSIS — N390 Urinary tract infection, site not specified: Secondary | ICD-10-CM | POA: Diagnosis present

## 2014-09-19 DIAGNOSIS — R112 Nausea with vomiting, unspecified: Secondary | ICD-10-CM | POA: Diagnosis not present

## 2014-09-19 DIAGNOSIS — N179 Acute kidney failure, unspecified: Secondary | ICD-10-CM

## 2014-09-19 DIAGNOSIS — A419 Sepsis, unspecified organism: Secondary | ICD-10-CM | POA: Diagnosis present

## 2014-09-19 DIAGNOSIS — J449 Chronic obstructive pulmonary disease, unspecified: Secondary | ICD-10-CM | POA: Diagnosis present

## 2014-09-19 DIAGNOSIS — N189 Chronic kidney disease, unspecified: Secondary | ICD-10-CM | POA: Diagnosis present

## 2014-09-19 DIAGNOSIS — F17210 Nicotine dependence, cigarettes, uncomplicated: Secondary | ICD-10-CM | POA: Diagnosis present

## 2014-09-19 DIAGNOSIS — G92 Toxic encephalopathy: Secondary | ICD-10-CM | POA: Diagnosis present

## 2014-09-19 DIAGNOSIS — E860 Dehydration: Secondary | ICD-10-CM | POA: Diagnosis present

## 2014-09-19 DIAGNOSIS — I129 Hypertensive chronic kidney disease with stage 1 through stage 4 chronic kidney disease, or unspecified chronic kidney disease: Secondary | ICD-10-CM | POA: Diagnosis present

## 2014-09-19 DIAGNOSIS — Z8673 Personal history of transient ischemic attack (TIA), and cerebral infarction without residual deficits: Secondary | ICD-10-CM | POA: Diagnosis not present

## 2014-09-19 DIAGNOSIS — M1990 Unspecified osteoarthritis, unspecified site: Secondary | ICD-10-CM | POA: Diagnosis present

## 2014-09-19 DIAGNOSIS — D696 Thrombocytopenia, unspecified: Secondary | ICD-10-CM | POA: Diagnosis present

## 2014-09-19 DIAGNOSIS — R509 Fever, unspecified: Secondary | ICD-10-CM | POA: Diagnosis present

## 2014-09-19 DIAGNOSIS — E039 Hypothyroidism, unspecified: Secondary | ICD-10-CM | POA: Diagnosis present

## 2014-09-19 DIAGNOSIS — E785 Hyperlipidemia, unspecified: Secondary | ICD-10-CM | POA: Diagnosis present

## 2014-09-19 DIAGNOSIS — Z9049 Acquired absence of other specified parts of digestive tract: Secondary | ICD-10-CM | POA: Diagnosis present

## 2014-09-19 LAB — COMPREHENSIVE METABOLIC PANEL
ALT: 20 U/L (ref 14–54)
ALT: 19 U/L (ref 14–54)
AST: 21 U/L (ref 15–41)
AST: 20 U/L (ref 15–41)
Albumin: 3.3 g/dL — ABNORMAL LOW (ref 3.5–5.0)
Albumin: 2.9 g/dL — ABNORMAL LOW (ref 3.5–5.0)
Alkaline Phosphatase: 84 U/L (ref 38–126)
Alkaline Phosphatase: 78 U/L (ref 38–126)
Anion gap: 9 (ref 5–15)
Anion gap: 7 (ref 5–15)
BILIRUBIN TOTAL: 0.7 mg/dL (ref 0.3–1.2)
BUN: 37 mg/dL — ABNORMAL HIGH (ref 6–20)
BUN: 34 mg/dL — ABNORMAL HIGH (ref 6–20)
CALCIUM: 9.4 mg/dL (ref 8.9–10.3)
CO2: 23 mmol/L (ref 22–32)
CO2: 23 mmol/L (ref 22–32)
CREATININE: 2.01 mg/dL — AB (ref 0.44–1.00)
Calcium: 8.9 mg/dL (ref 8.9–10.3)
Chloride: 104 mmol/L (ref 101–111)
Chloride: 108 mmol/L (ref 101–111)
Creatinine, Ser: 1.79 mg/dL — ABNORMAL HIGH (ref 0.44–1.00)
GFR calc Af Amer: 28 mL/min — ABNORMAL LOW (ref >60)
GFR calc Af Amer: 32 mL/min — ABNORMAL LOW (ref >60)
GFR calc non Af Amer: 24 mL/min — ABNORMAL LOW (ref >60)
GFR, EST NON AFRICAN AMERICAN: 27 mL/min — AB (ref >60)
GLUCOSE: 104 mg/dL — AB (ref 65–99)
GLUCOSE: 99 mg/dL (ref 65–99)
POTASSIUM: 3.8 mmol/L (ref 3.5–5.1)
Potassium: 3.5 mmol/L (ref 3.5–5.1)
Sodium: 136 mmol/L (ref 135–145)
Sodium: 138 mmol/L (ref 135–145)
TOTAL PROTEIN: 6.9 g/dL (ref 6.5–8.1)
Total Bilirubin: 0.5 mg/dL (ref 0.3–1.2)
Total Protein: 6 g/dL — ABNORMAL LOW (ref 6.5–8.1)

## 2014-09-19 LAB — LACTIC ACID, PLASMA
LACTIC ACID, VENOUS: 1.6 mmol/L (ref 0.5–2.0)
LACTIC ACID, VENOUS: 1 mmol/L (ref 0.5–2.0)

## 2014-09-19 LAB — URINALYSIS, ROUTINE W REFLEX MICROSCOPIC
Bilirubin Urine: NEGATIVE
GLUCOSE, UA: NEGATIVE mg/dL
Ketones, ur: NEGATIVE mg/dL
Nitrite: POSITIVE — AB
Protein, ur: NEGATIVE mg/dL
SPECIFIC GRAVITY, URINE: 1.01 (ref 1.005–1.030)
UROBILINOGEN UA: 0.2 mg/dL (ref 0.0–1.0)
pH: 6 (ref 5.0–8.0)

## 2014-09-19 LAB — CBC
HCT: 33 % — ABNORMAL LOW (ref 36.0–46.0)
Hemoglobin: 10.9 g/dL — ABNORMAL LOW (ref 12.0–15.0)
MCH: 29.5 pg (ref 26.0–34.0)
MCHC: 33 g/dL (ref 30.0–36.0)
MCV: 89.4 fL (ref 78.0–100.0)
Platelets: 98 10*3/uL — ABNORMAL LOW (ref 150–400)
RBC: 3.69 MIL/uL — ABNORMAL LOW (ref 3.87–5.11)
RDW: 11.7 % (ref 11.5–15.5)
WBC: 7.6 10*3/uL (ref 4.0–10.5)

## 2014-09-19 LAB — CBC WITH DIFFERENTIAL/PLATELET
BASOS ABS: 0 10*3/uL (ref 0.0–0.1)
BASOS PCT: 0 % (ref 0–1)
EOS ABS: 0.1 10*3/uL (ref 0.0–0.7)
Eosinophils Relative: 1 % (ref 0–5)
HCT: 36.1 % (ref 36.0–46.0)
Hemoglobin: 12.2 g/dL (ref 12.0–15.0)
LYMPHS ABS: 0.2 10*3/uL — AB (ref 0.7–4.0)
Lymphocytes Relative: 5 % — ABNORMAL LOW (ref 12–46)
MCH: 30.2 pg (ref 26.0–34.0)
MCHC: 33.8 g/dL (ref 30.0–36.0)
MCV: 89.4 fL (ref 78.0–100.0)
MONO ABS: 0.1 10*3/uL (ref 0.1–1.0)
Monocytes Relative: 3 % (ref 3–12)
Neutro Abs: 4.1 10*3/uL (ref 1.7–7.7)
Neutrophils Relative %: 91 % — ABNORMAL HIGH (ref 43–77)
Platelets: 81 10*3/uL — ABNORMAL LOW (ref 150–400)
RBC: 4.04 MIL/uL (ref 3.87–5.11)
RDW: 11.7 % (ref 11.5–15.5)
WBC: 4.5 10*3/uL (ref 4.0–10.5)

## 2014-09-19 LAB — URINE MICROSCOPIC-ADD ON

## 2014-09-19 MED ORDER — NICOTINE 14 MG/24HR TD PT24
14.0000 mg | MEDICATED_PATCH | Freq: Every day | TRANSDERMAL | Status: DC
  Administered 2014-09-19 – 2014-09-21 (×3): 14 mg via TRANSDERMAL
  Filled 2014-09-19 (×3): qty 1

## 2014-09-19 MED ORDER — DILTIAZEM HCL ER BEADS 240 MG PO CP24
360.0000 mg | ORAL_CAPSULE | Freq: Every day | ORAL | Status: DC
  Filled 2014-09-19 (×2): qty 1

## 2014-09-19 MED ORDER — CEFTRIAXONE SODIUM IN DEXTROSE 20 MG/ML IV SOLN
1.0000 g | INTRAVENOUS | Status: DC
  Filled 2014-09-19: qty 50

## 2014-09-19 MED ORDER — BOOST / RESOURCE BREEZE PO LIQD
1.0000 | Freq: Two times a day (BID) | ORAL | Status: DC
Start: 2014-09-19 — End: 2014-09-21
  Administered 2014-09-19 – 2014-09-21 (×3): 1 via ORAL

## 2014-09-19 MED ORDER — BOOST / RESOURCE BREEZE PO LIQD
1.0000 | Freq: Three times a day (TID) | ORAL | Status: DC
  Administered 2014-09-19: 1 via ORAL

## 2014-09-19 MED ORDER — LUBIPROSTONE 24 MCG PO CAPS
24.0000 ug | ORAL_CAPSULE | Freq: Two times a day (BID) | ORAL | Status: DC
  Administered 2014-09-20 – 2014-09-21 (×2): 24 ug via ORAL
  Filled 2014-09-19 (×9): qty 1

## 2014-09-19 MED ORDER — DEXTROSE 5 % IV SOLN
1.0000 g | Freq: Once | INTRAVENOUS | Status: AC
  Administered 2014-09-19: 1 g via INTRAVENOUS
  Filled 2014-09-19: qty 10

## 2014-09-19 MED ORDER — SODIUM CHLORIDE 0.9 % IV SOLN
Freq: Once | INTRAVENOUS | Status: AC
  Administered 2014-09-19: 02:00:00 via INTRAVENOUS

## 2014-09-19 MED ORDER — DEXTROSE 5 % IV SOLN
1.0000 g | INTRAVENOUS | Status: DC
  Administered 2014-09-19 – 2014-09-20 (×2): 1 g via INTRAVENOUS
  Filled 2014-09-19 (×3): qty 10

## 2014-09-19 MED ORDER — ACETAMINOPHEN 500 MG PO TABS
1000.0000 mg | ORAL_TABLET | Freq: Once | ORAL | Status: AC
  Administered 2014-09-19: 1000 mg via ORAL
  Filled 2014-09-19: qty 2

## 2014-09-19 MED ORDER — ACETAMINOPHEN 650 MG RE SUPP: 650.0000 mg | Freq: Four times a day (QID) | RECTAL | Status: DC | PRN

## 2014-09-19 MED ORDER — PRAVASTATIN SODIUM 40 MG PO TABS
40.0000 mg | ORAL_TABLET | Freq: Every day | ORAL | Status: DC
  Administered 2014-09-19 – 2014-09-21 (×3): 40 mg via ORAL
  Filled 2014-09-19 (×5): qty 1

## 2014-09-19 MED ORDER — PANTOPRAZOLE SODIUM 40 MG PO TBEC
40.0000 mg | DELAYED_RELEASE_TABLET | Freq: Every day | ORAL | Status: DC
  Administered 2014-09-19 – 2014-09-21 (×3): 40 mg via ORAL
  Filled 2014-09-19 (×3): qty 1

## 2014-09-19 MED ORDER — ONDANSETRON HCL 4 MG/2ML IJ SOLN: 4.0000 mg | Freq: Four times a day (QID) | INTRAMUSCULAR | Status: DC | PRN

## 2014-09-19 MED ORDER — OXYCODONE-ACETAMINOPHEN 5-325 MG PO TABS
1.0000 | ORAL_TABLET | ORAL | Status: DC | PRN
  Administered 2014-09-19 – 2014-09-21 (×4): 1 via ORAL
  Filled 2014-09-19 (×4): qty 1

## 2014-09-19 MED ORDER — ONDANSETRON HCL 4 MG PO TABS: 4.0000 mg | ORAL_TABLET | Freq: Four times a day (QID) | ORAL | Status: DC | PRN

## 2014-09-19 MED ORDER — SODIUM CHLORIDE 0.9 % IV SOLN
INTRAVENOUS | Status: DC
  Administered 2014-09-19: 05:00:00 via INTRAVENOUS
  Administered 2014-09-20: 1 mL via INTRAVENOUS
  Administered 2014-09-20: 17:00:00 via INTRAVENOUS

## 2014-09-19 MED ORDER — ASPIRIN EC 81 MG PO TBEC
81.0000 mg | DELAYED_RELEASE_TABLET | Freq: Every day | ORAL | Status: DC
  Administered 2014-09-19 – 2014-09-21 (×3): 81 mg via ORAL
  Filled 2014-09-19 (×3): qty 1

## 2014-09-19 MED ORDER — SODIUM CHLORIDE 0.9 % IV BOLUS (SEPSIS)
1000.0000 mL | Freq: Once | INTRAVENOUS | Status: AC
  Administered 2014-09-19: 1000 mL via INTRAVENOUS

## 2014-09-19 MED ORDER — ENOXAPARIN SODIUM 40 MG/0.4ML ~~LOC~~ SOLN
40.0000 mg | SUBCUTANEOUS | Status: DC
  Filled 2014-09-19: qty 0.4

## 2014-09-19 MED ORDER — DILTIAZEM HCL ER COATED BEADS 180 MG PO CP24
360.0000 mg | ORAL_CAPSULE | Freq: Every day | ORAL | Status: DC
  Administered 2014-09-19 – 2014-09-21 (×3): 360 mg via ORAL
  Filled 2014-09-19 (×3): qty 2

## 2014-09-19 MED ORDER — LEVOTHYROXINE SODIUM 75 MCG PO TABS
175.0000 ug | ORAL_TABLET | Freq: Every day | ORAL | Status: DC
  Administered 2014-09-19 – 2014-09-21 (×3): 175 ug via ORAL
  Filled 2014-09-19 (×6): qty 1

## 2014-09-19 MED ORDER — METOPROLOL TARTRATE 25 MG PO TABS
25.0000 mg | ORAL_TABLET | Freq: Two times a day (BID) | ORAL | Status: DC
  Administered 2014-09-19 – 2014-09-21 (×5): 25 mg via ORAL
  Filled 2014-09-19 (×5): qty 1

## 2014-09-19 MED ORDER — ENOXAPARIN SODIUM 40 MG/0.4ML ~~LOC~~ SOLN
40.0000 mg | SUBCUTANEOUS | Status: DC
  Administered 2014-09-19 – 2014-09-21 (×3): 40 mg via SUBCUTANEOUS
  Filled 2014-09-19 (×3): qty 0.4

## 2014-09-19 MED ORDER — ACETAMINOPHEN 325 MG PO TABS
650.0000 mg | ORAL_TABLET | Freq: Four times a day (QID) | ORAL | Status: DC | PRN
  Administered 2014-09-19 – 2014-09-20 (×3): 650 mg via ORAL
  Filled 2014-09-19 (×3): qty 2

## 2014-09-19 NOTE — H&P (Signed)
PCP:   [name], NP   Chief Complaint:  Fever  HPI:  69 year old female who  has a past medical history of Hypertension; Hyperlipidemia; Hypothyroidism; Anxiety; Osteoarthritis; COPD (chronic obstructive pulmonary disease); GERD (gastroesophageal reflux disease); Renal insufficiency; Stroke (1990s); Pneumonia (2010); Spinal stenosis (2010); and Gastric ulcer. Today patient was brought to the hospital after she developed fever and generalized body aches nausea vomiting and diarrhea. Patient denies abdominal pain. Patient says that the symptoms started 2 days ago she had 3-4 episodes of vomiting every day along with 3-4 loose bowel movements. Denies any chest pain or shortness of breath. No dysuria urgency frequency of urination. In the ED patient was found to have abnormal UA, AK I started on IV fluids and antibiotic.  Allergies:  No Known Allergies    Past Medical History  Diagnosis Date  . Hypertension   . Hyperlipidemia   . Hypothyroidism   . Anxiety   . Osteoarthritis   . COPD (chronic obstructive pulmonary disease)   . GERD (gastroesophageal reflux disease)   . Renal insufficiency   . Stroke 1990s    mild  . Pneumonia 2010  . Spinal stenosis 2010    lumbar  . Gastric ulcer     Past Surgical History  Procedure Laterality Date  . Cholecystectomy    . Appendectomy    . Abdominal hysterectomy    . Cystectomy      cyst removed from rectal area.   . Esophagogastroduodenoscopy  01/05/2003    RMR: Normal esophagus, small hiatal hernia/ A couple of tiny antral erosions, otherwise normal stomach normal D1 and D2.   . Colonoscopy      ?2005, [place]  . Colonoscopy with esophagogastroduodenoscopy (egd)  02/11/2012    RMR: Ulcerative/erosive reflux esophagitis, benign appearing gastric ulcer with unremarkable biopsy, no H pylori. Colonoscopy was unremarkable. Next colonoscopy in 2023  . Back surgery    . Bone marrow biopsy Left 04/22/14  . Bone marrow  aspiration Left 04/22/14  . Esophagogastroduodenoscopy N/A 08/11/2014    Procedure: ESOPHAGOGASTRODUODENOSCOPY (EGD);  Surgeon: Daneil Dolin, MD;  [facility]: [facility];  Service: Endoscopy;  Laterality: N/A;  200 - moved to 11:00 - pt knows to arrive at 10:00     Prior to Admission medications   Medication Sig Start Date End Date Taking? Authorizing Provider  acetaminophen (TYLENOL) 500 MG tablet Take 1,000 mg by mouth every 6 (six) hours as needed for mild pain.    Historical Provider, MD  aspirin EC 81 MG tablet Take 81 mg by mouth daily.    Historical Provider, MD  budesonide-formoterol (SYMBICORT) 160-4.5 MCG/ACT inhaler Inhale 2 puffs into the lungs 2 (two) times daily as needed (shortness of breath).     Historical Provider, MD  Cholecalciferol (VITAMIN D-3) 5000 UNITS TABS Take 1 tablet by mouth daily.    Historical Provider, MD  diltiazem (TIAZAC) 360 MG 24 hr capsule Take 360 mg by mouth daily.    Historical Provider, MD  levothyroxine (SYNTHROID, LEVOTHROID) 175 MCG tablet Take 175 mcg by mouth daily.    Historical Provider, MD  lisinopril (PRINIVIL,ZESTRIL) 40 MG tablet Take 40 mg by mouth daily.    Historical Provider, MD  lubiprostone (AMITIZA) 24 MCG capsule Take 1 capsule (24 mcg total) by mouth 2 (two) times daily with a meal. 07/28/14   Mahala Menghini, PA-C  metoprolol tartrate (LOPRESSOR) 25 MG tablet Take 25 mg by mouth 2 (two) times daily.    Historical  Provider, MD  Omega-3 Fatty Acids (FISH OIL) 1000 MG CAPS Take 1 capsule by mouth 2 (two) times daily.    Historical Provider, MD  oxyCODONE-acetaminophen (PERCOCET) 5-325 MG per tablet Take 1 tablet by mouth every 4 (four) hours as needed. Patient taking differently: Take 1 tablet by mouth every 4 (four) hours as needed for moderate pain.  04/10/13   Riki Altes, MD  pantoprazole (PROTONIX) 40 MG tablet Take 1 tablet (40 mg total) by mouth daily before breakfast. 07/28/14   Mahala Menghini, PA-C  potassium chloride (K-DUR) 10  MEQ tablet Take 10 mEq by mouth daily.  07/08/14   Historical Provider, MD  pravastatin (PRAVACHOL) 40 MG tablet Take 40 mg by mouth daily.    Historical Provider, MD  traZODone (DESYREL) 150 MG tablet Take 300 mg by mouth at bedtime.    Historical Provider, MD  triamterene-hydrochlorothiazide (MAXZIDE-25) 37.5-25 MG per tablet Take 1 tablet by mouth daily.    Historical Provider, MD    Social History:  reports that she has been smoking Cigarettes.  She has been smoking about 0.50 packs per day. She has never used smokeless tobacco. She reports that she does not drink alcohol or use illicit drugs.  Family History  Problem Relation Age of Onset  . Colon cancer Neg Hx   . Liver disease Neg Hx   . Other Mother     died age 91, natural causes    Filed Weights   09/18/14 2246  Weight: 104.327 kg (230 lb)    All the positives are listed in BOLD  Review of Systems:  HEENT: Headache, blurred vision, runny nose, sore throat Neck: Hypothyroidism, hyperthyroidism,,lymphadenopathy Chest : Shortness of breath, history of COPD, Asthma Heart : Chest pain, history of coronary arterey disease GI:  Nausea, vomiting, diarrhea, constipation, GERD GU: Dysuria, urgency, frequency of urination, hematuria Neuro: Stroke, seizures, syncope Psych: Depression, anxiety, hallucinations   Physical Exam: Blood pressure 110/52, pulse 83, temperature 99.7 F (37.6 C), temperature source Axillary, resp. rate 18, height 5' 9" (1.753 m), weight 104.327 kg (230 lb), SpO2 95 %. Constitutional:   Patient is a well-developed and well-nourished female* in no acute distress and cooperative with exam. Head: Normocephalic and atraumatic Mouth: Mucus membranes moist Eyes: PERRL, EOMI, conjunctivae normal Neck: Supple, No Thyromegaly Cardiovascular: RRR, S1 normal, S2 normal Pulmonary/Chest: CTAB, no wheezes, rales, or rhonchi Abdominal: Soft. Non-tender, non-distended, bowel sounds are normal, no masses, organomegaly,  or guarding present.  Neurological: A&O x3, Strength is normal and symmetric bilaterally, cranial nerve II-XII are grossly intact, no focal motor deficit, sensory intact to light touch bilaterally.  Extremities : No Cyanosis, Clubbing or Edema  Labs on Admission:  Basic Metabolic Panel:  Recent Labs Lab 09/19/14 0040  NA 136  K 3.5  CL 104  CO2 23  GLUCOSE 99  BUN 37*  CREATININE 2.01*  CALCIUM 9.4   Liver Function Tests:  Recent Labs Lab 09/19/14 0040  AST 21  ALT 20  ALKPHOS 84  BILITOT 0.7  PROT 6.9  ALBUMIN 3.3*   No results for input(s): LIPASE, AMYLASE in the last 168 hours. No results for input(s): AMMONIA in the last 168 hours. CBC:  Recent Labs Lab 09/19/14 0040  WBC 4.5  NEUTROABS 4.1  HGB 12.2  HCT 36.1  MCV 89.4  PLT 81*   Radiological Exams on Admission: Dg Abd Acute W/chest  09/19/2014   CLINICAL DATA:  Acute onset of generalized body aches, chills and fever. Disorientation. Vomiting.  Initial encounter.  EXAM: DG ABDOMEN ACUTE W/ 1V CHEST  COMPARISON:  Chest radiograph performed 05/03/2014, and abdominal radiograph performed 04/13/2012  FINDINGS: The lungs are well-aerated. Minimal right basilar atelectasis is noted. There is no evidence of pleural effusion or pneumothorax. The cardiomediastinal silhouette is within normal limits.  The visualized bowel gas pattern is unremarkable. Scattered stool and air are seen within the colon; there is no evidence of small bowel dilatation to suggest obstruction. No free intra-abdominal air is identified on the provided upright view. Clips are noted within the right upper quadrant, reflecting prior cholecystectomy.  No acute osseous abnormalities are seen; the sacroiliac joints are unremarkable in appearance. Degenerative change is noted along the lower lumbar spine.  IMPRESSION: 1. Unremarkable bowel gas pattern; no free intra-abdominal air seen. Moderate amount of stool noted in the colon. 2. Minimal right basilar  atelectasis noted; lungs otherwise clear.   Electronically Signed   By: Garald Balding M.D.   On: 09/19/2014 02:55     Assessment/Plan Active Problems:   Thrombocytopenia   Nausea with vomiting   Sepsis   UTI (lower urinary tract infection)  UTI Patient has abnormal UA, received Rocephin in the ED. We'll continue with IV Rocephin and follow urine culture results.  Acute kidney injury Likely from dehydration, nausea vomiting and diuretics BUN/creatinine today is 37/2.01, her baseline BUN/creatinine is 18/1.17. Start IV normal saline at 75 mL per hour Hold ACE inhibitor and HCTZ Maxide  Nausea and vomiting ? Cause, likely viral. Abdomen x-ray showed no significant abnormality Continue Zofran when necessary for nausea and vomiting.  Code status: Full code  Family discussion: Admission, patients condition and plan of care including tests being ordered have been discussed with the patient and *her husband and daughter at bedside who indicate understanding and agree with the plan and Code Status.   Time Spent on Admission: 60 minutes  [facility] Pager: [phone] 09/19/2014, 4:02 AM  If 7PM-7AM, please contact night-coverage  [email or web address]  Password [email or web address]

## 2014-09-19 NOTE — ED Provider Notes (Signed)
CSN: [id number]     Arrival date & time 09/18/14  2238 History   First MD Initiated Contact with Patient 09/19/14 0003     Chief Complaint  Patient presents with  . Generalized Body Aches  . Fever     (Consider location/radiation/quality/duration/timing/severity/associated sxs/prior Treatment) HPI  This is a 69 year old female who presents with chills, fever, generalized bodyaches. Husband and daughter state that she has been more confused and disoriented. She is also had nausea, vomiting and diarrhea. No known sick contacts. She does also report a cough.  No sick contacts. Patient reports generalized body aches. She also reports lower lumbar pain. No lateralizing pain. Denies any urinary symptoms. Denies any abdominal pain.  Past Medical History  Diagnosis Date  . Hypertension   . Hyperlipidemia   . Hypothyroidism   . Anxiety   . Osteoarthritis   . COPD (chronic obstructive pulmonary disease)   . GERD (gastroesophageal reflux disease)   . Renal insufficiency   . Stroke 1990s    mild  . Pneumonia 2010  . Spinal stenosis 2010    lumbar  . Gastric ulcer    Past Surgical History  Procedure Laterality Date  . Cholecystectomy    . Appendectomy    . Abdominal hysterectomy    . Cystectomy      cyst removed from rectal area.   . Esophagogastroduodenoscopy  01/05/2003    RMR: Normal esophagus, small hiatal hernia/ A couple of tiny antral erosions, otherwise normal stomach normal D1 and D2.   . Colonoscopy      ?2005, [place]  . Colonoscopy with esophagogastroduodenoscopy (egd)  02/11/2012    RMR: Ulcerative/erosive reflux esophagitis, benign appearing gastric ulcer with unremarkable biopsy, no H pylori. Colonoscopy was unremarkable. Next colonoscopy in 2023  . Back surgery    . Bone marrow biopsy Left 04/22/14  . Bone marrow aspiration Left 04/22/14  . Esophagogastroduodenoscopy N/A 08/11/2014    Procedure: ESOPHAGOGASTRODUODENOSCOPY (EGD);  Surgeon: Daneil Dolin, MD;   Location: [facility];  Service: Endoscopy;  Laterality: N/A;  200 - moved to 11:00 - pt knows to arrive at 10:00    Family History  Problem Relation Age of Onset  . Colon cancer Neg Hx   . Liver disease Neg Hx   . Other Mother     died age 88, natural causes   History  Substance Use Topics  . Smoking status: Current Every Day Smoker -- 0.50 packs/day    Types: Cigarettes  . Smokeless tobacco: Never Used  . Alcohol Use: No   OB History    No data available     Review of Systems  Constitutional: Positive for fever and chills.  Respiratory: Positive for cough. Negative for chest tightness and shortness of breath.   Cardiovascular: Negative for chest pain.  Gastrointestinal: Positive for nausea, vomiting and diarrhea. Negative for abdominal pain.  Genitourinary: Negative for dysuria.  Musculoskeletal: Negative for back pain.  Skin: Negative for wound.  Neurological: Negative for headaches.  Psychiatric/Behavioral: Positive for confusion.  All other systems reviewed and are negative.     Allergies  Review of patient's allergies indicates no known allergies.  Home Medications   Prior to Admission medications   Medication Sig Start Date End Date Taking? Authorizing Provider  acetaminophen (TYLENOL) 500 MG tablet Take 1,000 mg by mouth every 6 (six) hours as needed for mild pain.    Historical Provider, MD  aspirin EC 81 MG tablet Take 81 mg by mouth daily.  Historical Provider, MD  budesonide-formoterol (SYMBICORT) 160-4.5 MCG/ACT inhaler Inhale 2 puffs into the lungs 2 (two) times daily as needed (shortness of breath).     Historical Provider, MD  Cholecalciferol (VITAMIN D-3) 5000 UNITS TABS Take 1 tablet by mouth daily.    Historical Provider, MD  diltiazem (TIAZAC) 360 MG 24 hr capsule Take 360 mg by mouth daily.    Historical Provider, MD  levothyroxine (SYNTHROID, LEVOTHROID) 175 MCG tablet Take 175 mcg by mouth daily.    Historical Provider, MD  lisinopril  (PRINIVIL,ZESTRIL) 40 MG tablet Take 40 mg by mouth daily.    Historical Provider, MD  lubiprostone (AMITIZA) 24 MCG capsule Take 1 capsule (24 mcg total) by mouth 2 (two) times daily with a meal. 07/28/14   Mahala Menghini, PA-C  metoprolol tartrate (LOPRESSOR) 25 MG tablet Take 25 mg by mouth 2 (two) times daily.    Historical Provider, MD  Omega-3 Fatty Acids (FISH OIL) 1000 MG CAPS Take 1 capsule by mouth 2 (two) times daily.    Historical Provider, MD  oxyCODONE-acetaminophen (PERCOCET) 5-325 MG per tablet Take 1 tablet by mouth every 4 (four) hours as needed. Patient taking differently: Take 1 tablet by mouth every 4 (four) hours as needed for moderate pain.  04/10/13   Riki Altes, MD  pantoprazole (PROTONIX) 40 MG tablet Take 1 tablet (40 mg total) by mouth daily before breakfast. 07/28/14   Mahala Menghini, PA-C  potassium chloride (K-DUR) 10 MEQ tablet Take 10 mEq by mouth daily.  07/08/14   Historical Provider, MD  pravastatin (PRAVACHOL) 40 MG tablet Take 40 mg by mouth daily.    Historical Provider, MD  traZODone (DESYREL) 150 MG tablet Take 300 mg by mouth at bedtime.    Historical Provider, MD  triamterene-hydrochlorothiazide (MAXZIDE-25) 37.5-25 MG per tablet Take 1 tablet by mouth daily.    Historical Provider, MD   BP 110/52 mmHg  Pulse 83  Temp(Src) 99.7 F (37.6 C) (Axillary)  Resp 18  Ht 5' 9"  (1.753 m)  Wt 230 lb (104.327 kg)  BMI 33.95 kg/m2  SpO2 95% Physical Exam  Constitutional: No distress.  Elderly, ill-appearing but nontoxic  HENT:  Head: Normocephalic and atraumatic.  Eyes: Pupils are equal, round, and reactive to light.  Cardiovascular: Normal rate, regular rhythm and normal heart sounds.   No murmur heard. Pulmonary/Chest: Effort normal and breath sounds normal. No respiratory distress. She has no wheezes.  Abdominal: Soft. Bowel sounds are normal. There is no tenderness. There is no rebound and no guarding.  Musculoskeletal: She exhibits no edema.   Neurological: She is alert.  Sometimes tangential in her speech and not making sense, patient is oriented  Skin: Skin is warm and dry.  Psychiatric: She has a normal mood and affect.  Nursing note and vitals reviewed.   ED Course  Procedures (including critical care time) Labs Review Labs Reviewed  CBC WITH DIFFERENTIAL/PLATELET - Abnormal; Notable for the following:    Platelets 81 (*)    Neutrophils Relative % 91 (*)    Lymphocytes Relative 5 (*)    Lymphs Abs 0.2 (*)    All other components within normal limits  COMPREHENSIVE METABOLIC PANEL - Abnormal; Notable for the following:    BUN 37 (*)    Creatinine, Ser 2.01 (*)    Albumin 3.3 (*)    GFR calc non Af Amer 24 (*)    GFR calc Af Amer 28 (*)    All other components within normal  limits  URINALYSIS, ROUTINE W REFLEX MICROSCOPIC (NOT AT [facility]) - Abnormal; Notable for the following:    Hgb urine dipstick MODERATE (*)    Nitrite POSITIVE (*)    Leukocytes, UA TRACE (*)    All other components within normal limits  URINE MICROSCOPIC-ADD ON - Abnormal; Notable for the following:    Bacteria, UA FEW (*)    All other components within normal limits  CULTURE, BLOOD (ROUTINE X 2)  CULTURE, BLOOD (ROUTINE X 2)  URINE CULTURE  LACTIC ACID, PLASMA  LACTIC ACID, PLASMA    Imaging Review Dg Abd Acute W/chest  09/19/2014   CLINICAL DATA:  Acute onset of generalized body aches, chills and fever. Disorientation. Vomiting. Initial encounter.  EXAM: DG ABDOMEN ACUTE W/ 1V CHEST  COMPARISON:  Chest radiograph performed 05/03/2014, and abdominal radiograph performed 04/13/2012  FINDINGS: The lungs are well-aerated. Minimal right basilar atelectasis is noted. There is no evidence of pleural effusion or pneumothorax. The cardiomediastinal silhouette is within normal limits.  The visualized bowel gas pattern is unremarkable. Scattered stool and air are seen within the colon; there is no evidence of small bowel dilatation to suggest  obstruction. No free intra-abdominal air is identified on the provided upright view. Clips are noted within the right upper quadrant, reflecting prior cholecystectomy.  No acute osseous abnormalities are seen; the sacroiliac joints are unremarkable in appearance. Degenerative change is noted along the lower lumbar spine.  IMPRESSION: 1. Unremarkable bowel gas pattern; no free intra-abdominal air seen. Moderate amount of stool noted in the colon. 2. Minimal right basilar atelectasis noted; lungs otherwise clear.   Electronically Signed   By: Garald Balding M.D.   On: 09/19/2014 02:55     EKG Interpretation None      MDM   Final diagnoses:  UTI (lower urinary tract infection)  AKI (acute kidney injury)  Sepsis, due to unspecified organism  Nausea vomiting and diarrhea    Patient presents with chills, nausea, vomiting, and confusion. Nontoxic on exam. Rectal temp 103.5. Sepsis workup initiated. She did 1 L of fluid and blood cultures obtained. Lactate is normal.  White blood count is normal but does have a left shift. Patient has a nitrite-positive urine with 3-6 white cells and few bacteria. This is a possible source. Patient given 1 g of Rocephin. Other workup notable for creatinine of 2 reflecting acute kidney injury and dehydration.  Acute abdominal series and chest x-ray reassuring. Suspect viral gastroenteritis versus early urinary tract infection as the source of the patient's acute illness. Will cover UTI with Rocephin and admit for hydration and antibiotics.  Discussed with Dr. Darrick Meigs.  Merryl Hacker, MD 09/19/14 [phone]

## 2014-09-19 NOTE — Progress Notes (Signed)
Patient seen and examined. Admitted earlier today with a fever. Found to have a UTI and acute renal failure. Agree with IV fluids and Rocephin pending culture data. Recheck renal function in the morning. We'll continue to follow.  Peggye PittEstela Hernandez, MD [facility] Pager: [phone]

## 2014-09-19 NOTE — Progress Notes (Signed)
Initial Nutrition Assessment  DOCUMENTATION CODES: Obesity unspecified  INTERVENTION: [employer] Breeze po TID, each supplement provides 250 kcal and 9 grams of protein  As diet progresses, will monitor oral intake and add snacks/change supplements as warranted  NUTRITION DIAGNOSIS: Inadequate oral intake related to severe N/V/D- acute illness as evidenced by eating <50% of estimated needs in the past 5 days  GOAL: Patient will meet greater than or equal to 90% of their needs  MONITOR: PO intake, Supplement acceptance, Diet advancement, Labs, I & O's  REASON FOR ASSESSMENT: Malnutrition Screening Tool    ASSESSMENT: 69-y/o Female PMHX: HTN, HLD; Hypothyroidism; Anxiety; Osteoarthritis; COPD GERD presents with  Fever, generalized body aches, nausea, vomiting and diarrhea. Reports 3-4 episodes of vomiting every day along with 3-4 loose bowel movements.  Pt found with abnormal UA, AKI started on IV fluids and antibiotic.  Pt reports a few days ago she started having severe nausea/vomiting and diarrhea. She lost her appetite. Prior to that she was her normal self. The only supplement she took at home was Vitamin D. She did not follow any sort of diet.   She states her "normal weight" is 232 lbs. Unsure if the weight documented on her admit was reported or measured. Her wt appears to not usually be that elevated. Will assume it was reported and that she is at her normal weight.   She still does not have a good appetite. She says she does not like ensure and would like to stick to the [employer] Breeze for now.   Height: Ht Readings from Last 1 Encounters:  09/19/14 5\' 9"  (1.753 m)    Weight: Wt Readings from Last 1 Encounters:  09/19/14 221 lb 14.4 oz (100.653 kg)    Ideal Body Weight:  66 kg  Wt Readings from Last 10 Encounters:  09/19/14 221 lb 14.4 oz (100.653 kg)  08/11/14 224 lb (101.606 kg)  07/28/14 224 lb 6.4 oz (101.787 kg)  07/04/14 221 lb 12.8 oz (100.608 kg)   05/03/14 221 lb 4.8 oz (100.381 kg)  04/11/14 222 lb (100.699 kg)  04/10/13 230 lb (104.327 kg)  03/24/13 230 lb (104.327 kg)  01/22/12 235 lb 12.8 oz (106.958 kg)  11/04/11 230 lb (104.327 kg)    BMI:  Body mass index is 32.75 kg/(m^2).  Estimated Nutritional Needs: Kcal:  1500-1600 (15-16 kcal/kg) Protein:  72-86 (1.1-1.3 g/kg) Fluid:  1.5-1.6 liters  Skin:  WDL  Diet Order:  Diet clear liquid Room service appropriate?: Yes; Fluid consistency:: Thin  EDUCATION NEEDS: No education needs identified at this time   Intake/Output Summary (Last 24 hours) at 09/19/14 1221 Last data filed at 09/19/14 0900  Gross per 24 hour  Intake    690 ml  Output    600 ml  Net     90 ml    Last BM:  7/3  Christophe LouisNathan Samah Lapiana RD, LDN Nutrition Pager: [phone] 09/19/2014 12:21 PM

## 2014-09-19 NOTE — Progress Notes (Signed)
ANTIBIOTIC CONSULT NOTE - INITIAL  Pharmacy Consult for Rocephin Indication: fever / UTI  No Known Allergies  Patient Measurements: Height: 5\' 9"  (175.3 cm) Weight: 221 lb 14.4 oz (100.653 kg) IBW/kg (Calculated) : 66.2  Vital Signs: Temp: 98.4 F (36.9 C) (07/04 0824) Temp Source: Oral (07/04 0824) BP: 151/55 mmHg (07/04 0824) Pulse Rate: 71 (07/04 0824) Intake/Output from previous day: 07/03 0701 - 07/04 0700 In: 450 [I.V.:400; IV Piggyback:50] Out: -  Intake/Output from this shift: Total I/O In: 240 [P.O.:240] Out: 600 [Urine:600]  Labs:  Recent Labs  09/19/14 0040 09/19/14 0443  WBC 4.5 7.6  HGB 12.2 10.9*  PLT 81* 98*  CREATININE 2.01* 1.79*   Estimated Creatinine Clearance: 36.4 mL/min (by C-G formula based on Cr of 1.79). No results for input(s): VANCOTROUGH, VANCOPEAK, VANCORANDOM, GENTTROUGH, GENTPEAK, GENTRANDOM, TOBRATROUGH, TOBRAPEAK, TOBRARND, AMIKACINPEAK, AMIKACINTROU, AMIKACIN in the last 72 hours.   Microbiology: Recent Results (from the past 720 hour(s))  Culture, blood (routine x 2)     Status: None (Preliminary result)   Collection Time: 09/19/14  1:29 AM  Result Value Ref Range Status   Specimen Description BLOOD RIGHT HAND  Final   Special Requests BOTTLES DRAWN AEROBIC AND ANAEROBIC 6CC  Final   Culture PENDING  Incomplete   Report Status PENDING  Incomplete  Culture, blood (routine x 2)     Status: None (Preliminary result)   Collection Time: 09/19/14  1:48 AM  Result Value Ref Range Status   Specimen Description RIGHT ANTECUBITAL  Final   Special Requests BOTTLES DRAWN AEROBIC AND ANAEROBIC 6CC  Final   Culture PENDING  Incomplete   Report Status PENDING  Incomplete    Medical History: Past Medical History  Diagnosis Date  . Hypertension   . Hyperlipidemia   . Hypothyroidism   . Anxiety   . Osteoarthritis   . COPD (chronic obstructive pulmonary disease)   . GERD (gastroesophageal reflux disease)   . Renal insufficiency   .  Stroke 1990s    mild  . Pneumonia 2010  . Spinal stenosis 2010    lumbar  . Gastric ulcer    Rocephin 7/3 >>  Assessment: 69yo female with fever and abnormal UA.  Rocephin started for UTI.  No renal adjustment needed.   Goal of Therapy:  Eradicate infection.  Plan:  Rocephin 1gm IV q24hrs Switch to PO ABX when improved / appropriate Monitor labs, progress and c/s  Valrie HartHall, Reighlyn Elmes A 09/19/2014,12:22 PM

## 2014-09-20 DIAGNOSIS — G928 Other toxic encephalopathy: Secondary | ICD-10-CM

## 2014-09-20 DIAGNOSIS — G92 Toxic encephalopathy: Secondary | ICD-10-CM

## 2014-09-20 LAB — BASIC METABOLIC PANEL
ANION GAP: 7 (ref 5–15)
BUN: 17 mg/dL (ref 6–20)
CALCIUM: 9.2 mg/dL (ref 8.9–10.3)
CO2: 25 mmol/L (ref 22–32)
Chloride: 107 mmol/L (ref 101–111)
Creatinine, Ser: 1 mg/dL (ref 0.44–1.00)
GFR calc non Af Amer: 55 mL/min — ABNORMAL LOW (ref >60)
Glucose, Bld: 104 mg/dL — ABNORMAL HIGH (ref 65–99)
Potassium: 3.6 mmol/L (ref 3.5–5.1)
Sodium: 139 mmol/L (ref 135–145)

## 2014-09-20 LAB — CBC
HCT: 33.9 % — ABNORMAL LOW (ref 36.0–46.0)
Hemoglobin: 11.3 g/dL — ABNORMAL LOW (ref 12.0–15.0)
MCH: 30 pg (ref 26.0–34.0)
MCHC: 33.3 g/dL (ref 30.0–36.0)
MCV: 89.9 fL (ref 78.0–100.0)
PLATELETS: 131 10*3/uL — AB (ref 150–400)
RBC: 3.77 MIL/uL — ABNORMAL LOW (ref 3.87–5.11)
RDW: 11.8 % (ref 11.5–15.5)
WBC: 5.4 10*3/uL (ref 4.0–10.5)

## 2014-09-20 MED ORDER — BENZONATATE 100 MG PO CAPS
100.0000 mg | ORAL_CAPSULE | Freq: Three times a day (TID) | ORAL | Status: DC | PRN
  Administered 2014-09-20 (×2): 100 mg via ORAL
  Filled 2014-09-20 (×2): qty 1

## 2014-09-20 MED ORDER — BUDESONIDE-FORMOTEROL FUMARATE 160-4.5 MCG/ACT IN AERO
2.0000 | INHALATION_SPRAY | Freq: Two times a day (BID) | RESPIRATORY_TRACT | Status: DC | PRN
  Administered 2014-09-20 – 2014-09-21 (×3): 2 via RESPIRATORY_TRACT
  Filled 2014-09-20: qty 6

## 2014-09-20 NOTE — Progress Notes (Signed)
[facility] PROGRESS NOTE  Connie BlakeDarlean Ponce ZOX:[id number] DOB: 05-15-1945 DOA: 09/18/2014 PCP: Erasmo DownerStrader, Connie F, NP  Interim progress note Patient is a 69 year old woman admitted to the hospital on 7/3 with confusion. Was found to have a UTI. Encephalopathy has resolved. Plan to continue Rocephin pending culture data, also had some acute renal failure which has resolved. Will need resumption of her hydrochlorothiazide and ACE inhibitor prior to discharge if deemed appropriate.  Assessment/Plan: Acute metabolic encephalopathy -Presumed secondary to UTI. -Resolved.  UTI -Continue Rocephin pending culture data.  Acute renal failure -Resolved. -Continue to hold hydrochlorothiazide and ACE inhibitor today and consider restarting the morning if renal function remains within range.  Thrombocytopenia -Mild -Platelets recovering, up to 131 today. -Suspect related to acute illness, follow.  Code Status: Full Code Family Communication: Daughter at bedside updated on plan of care.  Disposition Plan: Anticipate DC home in 24-48 hours   Consultants:  None   Antibiotics:  Rocephin   Subjective: Denies CP, SOB. Has some mild CP.  Objective: Filed Vitals:   09/19/14 1400 09/19/14 2244 09/19/14 2247 09/20/14 0639  BP: 132/70 130/58 130/58 156/67  Pulse: 77 88 90 77  Temp: 98.6 F (37 C)  99.6 F (37.6 C) 98.5 F (36.9 C)  TempSrc: Oral  Oral Oral  Resp:    18  Height:      Weight:      SpO2: 94%  97% 99%    Intake/Output Summary (Last 24 hours) at 09/20/14 1322 Last data filed at 09/20/14 0600  Gross per 24 hour  Intake 1836.25 ml  Output    800 ml  Net 1036.25 ml   Filed Weights   09/18/14 2246 09/19/14 0405  Weight: 104.327 kg (230 lb) 100.653 kg (221 lb 14.4 oz)    Exam:   General:   alert, awake, oriented 3  Cardiovascular: RRR  Respiratory: CTA B  Abdomen: S/NT/ND/+BS  Extremities: no C/C/E   Neurologic:  Intact/non-focal  Data  Reviewed: Basic Metabolic Panel:  Recent Labs Lab 09/19/14 0040 09/19/14 0443 09/20/14 0630  NA 136 138 139  K 3.5 3.8 3.6  CL 104 108 107  CO2 23 23 25   GLUCOSE 99 104* 104*  BUN 37* 34* 17  CREATININE 2.01* 1.79* 1.00  CALCIUM 9.4 8.9 9.2   Liver Function Tests:  Recent Labs Lab 09/19/14 0040 09/19/14 0443  AST 21 20  ALT 20 19  ALKPHOS 84 78  BILITOT 0.7 0.5  PROT 6.9 6.0*  ALBUMIN 3.3* 2.9*   No results for input(s): LIPASE, AMYLASE in the last 168 hours. No results for input(s): AMMONIA in the last 168 hours. CBC:  Recent Labs Lab 09/19/14 0040 09/19/14 0443 09/20/14 0630  WBC 4.5 7.6 5.4  NEUTROABS 4.1  --   --   HGB 12.2 10.9* 11.3*  HCT 36.1 33.0* 33.9*  MCV 89.4 89.4 89.9  PLT 81* 98* 131*   Cardiac Enzymes: No results for input(s): CKTOTAL, CKMB, CKMBINDEX, TROPONINI in the last 168 hours. BNP (last 3 results) No results for input(s): BNP in the last 8760 hours.  ProBNP (last 3 results) No results for input(s): PROBNP in the last 8760 hours.  CBG: No results for input(s): GLUCAP in the last 168 hours.  Recent Results (from the past 240 hour(s))  Culture, blood (routine x 2)     Status: None (Preliminary result)   Collection Time: 09/19/14  1:29 AM  Result Value Ref Range Status   Specimen Description BLOOD RIGHT  HAND  Final   Special Requests BOTTLES DRAWN AEROBIC AND ANAEROBIC 6CC  Final   Culture PENDING  Incomplete   Report Status PENDING  Incomplete  Culture, blood (routine x 2)     Status: None (Preliminary result)   Collection Time: 09/19/14  1:48 AM  Result Value Ref Range Status   Specimen Description RIGHT ANTECUBITAL  Final   Special Requests BOTTLES DRAWN AEROBIC AND ANAEROBIC 6CC  Final   Culture PENDING  Incomplete   Report Status PENDING  Incomplete     Studies: Dg Abd Acute W/chest  09/19/2014   CLINICAL DATA:  Acute onset of generalized body aches, chills and fever. Disorientation. Vomiting. Initial encounter.  EXAM:  DG ABDOMEN ACUTE W/ 1V CHEST  COMPARISON:  Chest radiograph performed 05/03/2014, and abdominal radiograph performed 04/13/2012  FINDINGS: The lungs are well-aerated. Minimal right basilar atelectasis is noted. There is no evidence of pleural effusion or pneumothorax. The cardiomediastinal silhouette is within normal limits.  The visualized bowel gas pattern is unremarkable. Scattered stool and air are seen within the colon; there is no evidence of small bowel dilatation to suggest obstruction. No free intra-abdominal air is identified on the provided upright view. Clips are noted within the right upper quadrant, reflecting prior cholecystectomy.  No acute osseous abnormalities are seen; the sacroiliac joints are unremarkable in appearance. Degenerative change is noted along the lower lumbar spine.  IMPRESSION: 1. Unremarkable bowel gas pattern; no free intra-abdominal air seen. Moderate amount of stool noted in the colon. 2. Minimal right basilar atelectasis noted; lungs otherwise clear.   Electronically Signed   By: Roanna Raider M.D.   On: 09/19/2014 02:55    Scheduled Meds: . aspirin EC  81 mg Oral Daily  . cefTRIAXone (ROCEPHIN)  IV  1 g Intravenous Q24H  . diltiazem  360 mg Oral Daily  . enoxaparin (LOVENOX) injection  40 mg Subcutaneous Q24H  . feeding supplement (RESOURCE BREEZE)  1 Container Oral BID BM  . levothyroxine  175 mcg Oral Daily  . lubiprostone  24 mcg Oral BID WC  . metoprolol tartrate  25 mg Oral BID  . nicotine  14 mg Transdermal Daily  . pantoprazole  40 mg Oral QAC breakfast  . pravastatin  40 mg Oral Daily   Continuous Infusions: . sodium chloride 1 mL (09/20/14 0344)    Active Problems:   Thrombocytopenia   Nausea with vomiting   Sepsis   UTI (lower urinary tract infection)   AKI (acute kidney injury)    Time spent: 30 minutes. Greater than 50% of this time was spent in direct contact with the patient coordinating care.    Chaya Jan  [facility] Pager [phone]  If 7PM-7AM, please contact night-coverage at [email or web address], password [facility] 09/20/2014, 1:22 PM  LOS: 1 day

## 2014-09-21 DIAGNOSIS — D696 Thrombocytopenia, unspecified: Secondary | ICD-10-CM

## 2014-09-21 DIAGNOSIS — N390 Urinary tract infection, site not specified: Secondary | ICD-10-CM

## 2014-09-21 DIAGNOSIS — G92 Toxic encephalopathy: Secondary | ICD-10-CM

## 2014-09-21 LAB — URINE CULTURE

## 2014-09-21 LAB — BASIC METABOLIC PANEL
Anion gap: 7 (ref 5–15)
BUN: 12 mg/dL (ref 6–20)
CALCIUM: 9.2 mg/dL (ref 8.9–10.3)
CO2: 27 mmol/L (ref 22–32)
CREATININE: 0.92 mg/dL (ref 0.44–1.00)
Chloride: 108 mmol/L (ref 101–111)
GFR calc Af Amer: >60 mL/min (ref >60)
GFR calc non Af Amer: >60 mL/min (ref >60)
GLUCOSE: 110 mg/dL — AB (ref 65–99)
Potassium: 3.5 mmol/L (ref 3.5–5.1)
SODIUM: 142 mmol/L (ref 135–145)

## 2014-09-21 LAB — CBC
HCT: 32.7 % — ABNORMAL LOW (ref 36.0–46.0)
Hemoglobin: 11.1 g/dL — ABNORMAL LOW (ref 12.0–15.0)
MCH: 30.2 pg (ref 26.0–34.0)
MCHC: 33.9 g/dL (ref 30.0–36.0)
MCV: 89.1 fL (ref 78.0–100.0)
Platelets: 131 10*3/uL — ABNORMAL LOW (ref 150–400)
RBC: 3.67 MIL/uL — AB (ref 3.87–5.11)
RDW: 11.7 % (ref 11.5–15.5)
WBC: 4.2 10*3/uL (ref 4.0–10.5)

## 2014-09-21 MED ORDER — NICOTINE 14 MG/24HR TD PT24: 14.0000 mg | MEDICATED_PATCH | Freq: Every day | TRANSDERMAL | Status: DC

## 2014-09-21 MED ORDER — LEVOTHYROXINE SODIUM 175 MCG PO TABS: 175.0000 ug | ORAL_TABLET | Freq: Every day | ORAL | Status: DC

## 2014-09-21 MED ORDER — CIPROFLOXACIN HCL 500 MG PO TABS: 500.0000 mg | ORAL_TABLET | Freq: Two times a day (BID) | ORAL | Status: DC

## 2014-09-21 NOTE — Discharge Summary (Signed)
Physician Discharge Summary  Connie Ponce ZOX:[id number] DOB: 1945/05/29 DOA: 09/18/2014  PCP: Erasmo Downer, NP  Admit date: 09/18/2014 Discharge date: 09/21/2014  Time spent: 30 minutes  Recommendations for Outpatient Follow-up:  1. Follow up with PCP in one week    Discharge Diagnoses:  Principal Problem:   UTI (lower urinary tract infection) Active Problems:   Thrombocytopenia   Nausea with vomiting   ARF (acute renal failure)   Toxic metabolic encephalopathy   Discharge Condition:  No fever, chills, nausea, vomiting, or dysura.   Diet recommendation: Heart healthy diet.   Filed Weights   09/18/14 2246 09/19/14 0405  Weight: 104.327 kg (230 lb) 100.653 kg (221 lb 14.4 oz)    History of present illness: Patient was admitted to the hospital by Dr Sharl Ma for metabolic encephalopathy felt to be due to UTI on September 19, 2014.  As per his H and P:  "69 year old female who  has a past medical history of Hypertension; Hyperlipidemia; Hypothyroidism; Anxiety; Osteoarthritis; COPD (chronic obstructive pulmonary disease); GERD (gastroesophageal reflux disease); Renal insufficiency; Stroke (1990s); Pneumonia (2010); Spinal stenosis (2010); and Gastric ulcer. Today patient was brought to the hospital after she developed fever and generalized body aches nausea vomiting and diarrhea. Patient denies abdominal pain. Patient says that the symptoms started 2 days ago she had 3-4 episodes of vomiting every day along with 3-4 loose bowel movements. Denies any chest pain or shortness of breath. No dysuria urgency frequency of urination. In the ED patient was found to have abnormal UA, AK I started on IV fluids and antibiotic.    Hospital Course: Patient was admitted into the hospital.  She was given IV Rocephin, along with IVF, and her ACE I and HCTZ were held.  She was tx with IV Zofran for symptomatic nausea and vomiting.  She improved mentally rather quickly, and her encephalopathy resolved.  Her AKI  on CKD also improved as well.   Her urine culture grew E coli, greater than 10 ex 5th, pan sensitive.   She is anxious to go home, no longer symptomatic, and will be discharged home on Cipro orally for another 5 days.  Her ACE I and diuretics were held, but will be resumed upon discharged. She will see her PCP in follow up in one week.  Thank you and good day.    Discharge Exam: Filed Vitals:   09/21/14 0622  BP: 169/78  Pulse: 69  Temp: 98.2 F (36.8 C)  Resp: 18    General: No confusion, dysuria, nausea, vomiting or chills.  Cardiovascular: No CP.  Respiratory: No SOB and no DOE>   Discharge Instructions   Discharge Instructions    Diet - low sodium heart healthy    Complete by:  As directed      Increase activity slowly    Complete by:  As directed           Current Discharge Medication List    START taking these medications   Details  ciprofloxacin (CIPRO) 500 MG tablet Take 1 tablet (500 mg total) by mouth 2 (two) times daily. Qty: 10 tablet, Refills: 0    nicotine (NICODERM CQ - DOSED IN MG/24 HOURS) 14 mg/24hr patch Place 1 patch (14 mg total) onto the skin daily. Qty: 28 patch, Refills: 0      CONTINUE these medications which have CHANGED   Details  !! levothyroxine (SYNTHROID, LEVOTHROID) 175 MCG tablet Take 1 tablet (175 mcg total) by mouth daily. Qty: 30 tablet, Refills:  1     !! - Potential duplicate medications found. Please discuss with provider.    CONTINUE these medications which have NOT CHANGED   Details  aspirin EC 81 MG tablet Take 81 mg by mouth daily.    budesonide-formoterol (SYMBICORT) 160-4.5 MCG/ACT inhaler Inhale 2 puffs into the lungs 2 (two) times daily as needed (shortness of breath).     Cholecalciferol (VITAMIN D-3) 5000 UNITS TABS Take 1 tablet by mouth daily.    diltiazem (TIAZAC) 360 MG 24 hr capsule Take 360 mg by mouth daily.    !! levothyroxine (SYNTHROID, LEVOTHROID) 150 MCG tablet Take 150 mcg by mouth daily.     lisinopril (PRINIVIL,ZESTRIL) 40 MG tablet Take 40 mg by mouth daily.    metoprolol tartrate (LOPRESSOR) 25 MG tablet Take 25 mg by mouth 2 (two) times daily.    Omega-3 Fatty Acids (FISH OIL) 1000 MG CAPS Take 1 capsule by mouth 2 (two) times daily.    pantoprazole (PROTONIX) 40 MG tablet Take 1 tablet (40 mg total) by mouth daily before breakfast. Qty: 30 tablet, Refills: 3    potassium chloride (K-DUR) 10 MEQ tablet Take 10 mEq by mouth daily.     pravastatin (PRAVACHOL) 40 MG tablet Take 40 mg by mouth daily.    traZODone (DESYREL) 150 MG tablet Take 300 mg by mouth at bedtime.    triamterene-hydrochlorothiazide (MAXZIDE-25) 37.5-25 MG per tablet Take 1 tablet by mouth daily.    acetaminophen (TYLENOL) 500 MG tablet Take 1,000 mg by mouth every 6 (six) hours as needed for mild pain.    lubiprostone (AMITIZA) 24 MCG capsule Take 1 capsule (24 mcg total) by mouth 2 (two) times daily with a meal. Qty: 60 capsule, Refills: 5    oxyCODONE-acetaminophen (PERCOCET) 5-325 MG per tablet Take 1 tablet by mouth every 4 (four) hours as needed. Qty: 10 tablet, Refills: 0     !! - Potential duplicate medications found. Please discuss with provider.       Significant Diagnostic Studies: Dg Abd Acute W/chest  09/19/2014   CLINICAL DATA:  Acute onset of generalized body aches, chills and fever. Disorientation. Vomiting. Initial encounter.  EXAM: DG ABDOMEN ACUTE W/ 1V CHEST  COMPARISON:  Chest radiograph performed 05/03/2014, and abdominal radiograph performed 04/13/2012  FINDINGS: The lungs are well-aerated. Minimal right basilar atelectasis is noted. There is no evidence of pleural effusion or pneumothorax. The cardiomediastinal silhouette is within normal limits.  The visualized bowel gas pattern is unremarkable. Scattered stool and air are seen within the colon; there is no evidence of small bowel dilatation to suggest obstruction. No free intra-abdominal air is identified on the provided  upright view. Clips are noted within the right upper quadrant, reflecting prior cholecystectomy.  No acute osseous abnormalities are seen; the sacroiliac joints are unremarkable in appearance. Degenerative change is noted along the lower lumbar spine.  IMPRESSION: 1. Unremarkable bowel gas pattern; no free intra-abdominal air seen. Moderate amount of stool noted in the colon. 2. Minimal right basilar atelectasis noted; lungs otherwise clear.   Electronically Signed   By: Roanna Raider M.D.   On: 09/19/2014 02:55    Microbiology: Recent Results (from the past 240 hour(s))  Urine culture     Status: None   Collection Time: 09/19/14  1:05 AM  Result Value Ref Range Status   Specimen Description URINE, CATHETERIZED  Final   Special Requests NONE  Final   Culture   Final    >=100,000 COLONIES/mL ESCHERICHIA COLI [facility]    Report Status 09/21/2014 FINAL  Final   Organism ID, Bacteria ESCHERICHIA COLI  Final      Susceptibility   Escherichia coli - MIC*    AMPICILLIN 8 SENSITIVE Sensitive     CEFAZOLIN <=4 SENSITIVE Sensitive     CEFTRIAXONE <=1 SENSITIVE Sensitive     CIPROFLOXACIN <=0.25 SENSITIVE Sensitive     GENTAMICIN <=1 SENSITIVE Sensitive     IMIPENEM <=0.25 SENSITIVE Sensitive     NITROFURANTOIN <=16 SENSITIVE Sensitive     TRIMETH/SULFA <=20 SENSITIVE Sensitive     AMPICILLIN/SULBACTAM 4 SENSITIVE Sensitive     PIP/TAZO <=4 SENSITIVE Sensitive     * >=100,000 COLONIES/mL ESCHERICHIA COLI  Culture, blood (routine x 2)     Status: None (Preliminary result)   Collection Time: 09/19/14  1:29 AM  Result Value Ref Range Status   Specimen Description BLOOD RIGHT HAND  Final   Special Requests BOTTLES DRAWN AEROBIC AND ANAEROBIC 6CC  Final   Culture PENDING  Incomplete   Report Status PENDING  Incomplete  Culture, blood (routine x 2)     Status: None (Preliminary result)   Collection Time: 09/19/14  1:48 AM  Result Value Ref Range Status   Specimen  Description RIGHT ANTECUBITAL  Final   Special Requests BOTTLES DRAWN AEROBIC AND ANAEROBIC [facility]CC  Final   Culture PENDING  Incomplete   Report Status PENDING  Incomplete     Labs: Basic Metabolic Panel:  Recent Labs Lab 09/19/14 0040 09/19/14 0443 09/20/14 0630 09/21/14 0623  NA 136 138 139 142  K 3.5 3.8 3.6 3.5  CL 104 108 107 108  CO2 23 23 25 27   GLUCOSE 99 104* 104* 110*  BUN 37* 34* 17 12  CREATININE 2.01* 1.79* 1.00 0.92  CALCIUM 9.4 8.9 9.2 9.2   Liver Function Tests:  Recent Labs Lab 09/19/14 0040 09/19/14 0443  AST 21 20  ALT 20 19  ALKPHOS 84 78  BILITOT 0.7 0.5  PROT 6.9 6.0*  ALBUMIN 3.3* 2.9*   CBC:  Recent Labs Lab 09/19/14 0040 09/19/14 0443 09/20/14 0630 09/21/14 0623  WBC 4.5 7.6 5.4 4.2  NEUTROABS 4.1  --   --   --   HGB 12.2 10.9* 11.3* 11.1*  HCT 36.1 33.0* 33.9* 32.7*  MCV 89.4 89.4 89.9 89.1  PLT 81* 98* 131* 131*      Signed:  Boston Catarino  [facility] 09/21/2014, 1:20 PM

## 2014-09-21 NOTE — Progress Notes (Signed)
Discharge instruction reviewed with patient. No distress noted 

## 2014-09-21 NOTE — Care Management Note (Signed)
Case Management Note  Patient Details  Name: Charlett BlakeDarlean Bondy MRN: [id number] Date of Birth: 1945-12-29  Subjective/Objective:                  Pt admitted from home with UTI/AKi. Pt lives with her husband will return home at discharge. Pt is independent with ADL's.  Action/Plan: No CM needs noted.  Expected Discharge Date:  09/21/14               Expected Discharge Plan:  Home/Self Care  In-House Referral:  NA  Discharge planning Services  CM Consult  Post Acute Care Choice:  NA Choice offered to:  NA  DME Arranged:    DME Agency:     HH Arranged:    HH Agency:     Status of Service:  Completed, signed off  Medicare Important Message Given:    Date Medicare IM Given:    Medicare IM give by:    Date Additional Medicare IM Given:    Additional Medicare Important Message give by:     If discussed at Long Length of Stay Meetings, dates discussed:    Additional Comments:  Cheryl FlashBlackwell, Jasilyn Holderman Crowder, RN 09/21/2014, 11:58 AM

## 2014-09-24 LAB — CULTURE, BLOOD (ROUTINE X 2)
Culture: NO GROWTH
Culture: NO GROWTH

## 2014-11-08 ENCOUNTER — Other Ambulatory Visit: Payer: Self-pay | Admitting: Gastroenterology

## 2014-11-14 ENCOUNTER — Ambulatory Visit: Payer: Medicare HMO | Admitting: Nurse Practitioner

## 2014-11-29 ENCOUNTER — Encounter: Payer: Self-pay | Admitting: Nurse Practitioner

## 2014-11-29 ENCOUNTER — Ambulatory Visit (INDEPENDENT_AMBULATORY_CARE_PROVIDER_SITE_OTHER): Payer: Medicare HMO | Admitting: Nurse Practitioner

## 2014-11-29 ENCOUNTER — Other Ambulatory Visit: Payer: Self-pay

## 2014-11-29 VITALS — BP 123/68 | HR 65 | Temp 98.0°F | Ht 66.0 in | Wt 217.6 lb

## 2014-11-29 DIAGNOSIS — K2100 Gastro-esophageal reflux disease with esophagitis, without bleeding: Secondary | ICD-10-CM

## 2014-11-29 DIAGNOSIS — K259 Gastric ulcer, unspecified as acute or chronic, without hemorrhage or perforation: Secondary | ICD-10-CM | POA: Insufficient documentation

## 2014-11-29 DIAGNOSIS — R1013 Epigastric pain: Secondary | ICD-10-CM

## 2014-11-29 MED ORDER — DEXLANSOPRAZOLE 60 MG PO CPDR: 60.0000 mg | DELAYED_RELEASE_CAPSULE | Freq: Every day | ORAL | Status: DC

## 2014-11-29 NOTE — Progress Notes (Signed)
CC'ED TO PCP 

## 2014-11-29 NOTE — Patient Instructions (Signed)
1. Continue taking Protonix twice a day. 2. We are submitting a prior authorization to your insurance company for [employer]. If it goes through we will call you and notify you to stop taking Protonix and start Dexilant. 3. We will schedule your endoscopy for you. 4. Return for follow-up in 3 months.

## 2014-11-29 NOTE — Progress Notes (Signed)
Referring Provider: [name], NP Primary Care Physician:  [name], NP Primary GI:  Dr. Gala Romney  Chief Complaint  Patient presents with  . Follow-up  . Abdominal Pain    HPI:   69 year old female presents for follow-up on endoscopy. Endoscopy was completed on 08/11/2014 for recurrent epigastric pain and history of peptic ulcer disease. Findings include persisting and somewhat enlarging gastric ulcer with satellite erosions, status post gastric ulcer biopsy, hiatal hernia, posterior bulbar duodenal erosions and secondary stenosis. No NSAID ingestion history. Fasting serum gastrin level ordered, Protonix increased to 40 mg twice daily. Pathology showed ulcerated benign gastric mucosa with reactive changes negative for H. pylori. Serum gastrin level showed mild elevation of 119 most consistent with PPI use.  Today she states she has been taking Protonix bid as ordered. Continues to have epigastric pain which is no better then previously. Admits occasional N/V. Also with esophageal burning, and bitter/sour taste in the mouth. Denies dental pain, excessive belching and hiccups. Denies NSAID use, only uses Tylenol prn. Denies current ASA powders; Used to use them, but not in over a year. Denies hematochezia. Admits occasional dark stools, not on iron or use of pepto bismol. Has previously tried Omeprazole. Denies chest pain, dyspnea, dizziness, lightheadedness, syncope, near syncope. Denies any other upper or lower GI symptoms.  Past Medical History  Diagnosis Date  . Hypertension   . Hyperlipidemia   . Hypothyroidism   . Anxiety   . Osteoarthritis   . COPD (chronic obstructive pulmonary disease)   . GERD (gastroesophageal reflux disease)   . Renal insufficiency   . Stroke 1990s    mild  . Pneumonia 2010  . Spinal stenosis 2010    lumbar  . Gastric ulcer     Past Surgical History  Procedure Laterality Date  . Cholecystectomy    . Appendectomy    . Abdominal  hysterectomy    . Cystectomy      cyst removed from rectal area.   . Esophagogastroduodenoscopy  01/05/2003    RMR: Normal esophagus, small hiatal hernia/ A couple of tiny antral erosions, otherwise normal stomach normal D1 and D2.   . Colonoscopy      ?2005, [place]  . Colonoscopy with esophagogastroduodenoscopy (egd)  02/11/2012    RMR: Ulcerative/erosive reflux esophagitis, benign appearing gastric ulcer with unremarkable biopsy, no H pylori. Colonoscopy was unremarkable. Next colonoscopy in 2023  . Back surgery    . Bone marrow biopsy Left 04/22/14  . Bone marrow aspiration Left 04/22/14  . Esophagogastroduodenoscopy N/A 08/11/2014    CZY:SAYTKZS ulcer/HH    Current Outpatient Prescriptions  Medication Sig Dispense Refill  . acetaminophen (TYLENOL) 500 MG tablet Take 1,000 mg by mouth every 6 (six) hours as needed for mild pain.    Marland Kitchen aspirin EC 81 MG tablet Take 81 mg by mouth daily.    . budesonide-formoterol (SYMBICORT) 160-4.5 MCG/ACT inhaler Inhale 2 puffs into the lungs 2 (two) times daily as needed (shortness of breath).     . Cholecalciferol (VITAMIN D-3) 5000 UNITS TABS Take 1 tablet by mouth daily.    Marland Kitchen diltiazem (TIAZAC) 360 MG 24 hr capsule Take 360 mg by mouth daily.    Marland Kitchen levothyroxine (SYNTHROID, LEVOTHROID) 150 MCG tablet Take 150 mcg by mouth daily.    Marland Kitchen levothyroxine (SYNTHROID, LEVOTHROID) 175 MCG tablet Take 1 tablet (175 mcg total) by mouth daily. 30 tablet 1  . lisinopril (PRINIVIL,ZESTRIL) 40 MG tablet Take 40 mg by mouth daily.    Marland Kitchen  lubiprostone (AMITIZA) 24 MCG capsule Take 1 capsule (24 mcg total) by mouth 2 (two) times daily with a meal. 60 capsule 5  . metoprolol tartrate (LOPRESSOR) 25 MG tablet Take 25 mg by mouth 2 (two) times daily.    . Omega-3 Fatty Acids (FISH OIL) 1000 MG CAPS Take 1 capsule by mouth 2 (two) times daily.    Marland Kitchen oxyCODONE-acetaminophen (PERCOCET) 5-325 MG per tablet Take 1 tablet by mouth every 4 (four) hours as needed. (Patient  taking differently: Take 1 tablet by mouth every 4 (four) hours as needed for moderate pain. ) 10 tablet 0  . pantoprazole (PROTONIX) 40 MG tablet TAKE (1) TABLET BY MOUTH DAILY BEFORE BREAKFAST. 28 tablet 5  . potassium chloride (K-DUR) 10 MEQ tablet Take 10 mEq by mouth daily.     . pravastatin (PRAVACHOL) 40 MG tablet Take 40 mg by mouth daily.    . prednisoLONE acetate (PRED FORTE) 1 % ophthalmic suspension Administer 1 drop into the left eye Four (4) times a day.    . traZODone (DESYREL) 150 MG tablet Take 300 mg by mouth at bedtime.    . triamterene-hydrochlorothiazide (MAXZIDE-25) 37.5-25 MG per tablet Take 1 tablet by mouth daily.    . ciprofloxacin (CIPRO) 500 MG tablet Take 1 tablet (500 mg total) by mouth 2 (two) times daily. (Patient not taking: Reported on 11/29/2014) 10 tablet 0  . nicotine (NICODERM CQ - DOSED IN MG/24 HOURS) 14 mg/24hr patch Place 1 patch (14 mg total) onto the skin daily. (Patient not taking: Reported on 11/29/2014) 28 patch 0   No current facility-administered medications for this visit.    Allergies as of 11/29/2014  . (No Known Allergies)    Family History  Problem Relation Age of Onset  . Colon cancer Neg Hx   . Liver disease Neg Hx   . Other Mother     died age 103, natural causes    Social History   Social History  . Marital Status: Married    Spouse Name: N/A  . Number of Children: 3  . Years of Education: N/A   Social History Main Topics  . Smoking status: Former Smoker -- 0.50 packs/day    Types: Cigarettes    Quit date: 08/22/2014  . Smokeless tobacco: Never Used  . Alcohol Use: No  . Drug Use: No  . Sexual Activity: No   Other Topics Concern  . None   Social History Narrative    Review of Systems: General: Negative for anorexia, weight loss, fever, chills, fatigue, weakness. ENT: Negative for hoarseness, difficulty swallowing. CV: Negative for chest pain, angina, palpitations, peripheral edema.  Respiratory: Negative for  dyspnea at rest, cough, sputum, wheezing.  GI: See history of present illness. Endo: Negative for unusual weight change.  Heme: Negative for bruising or bleeding.   Physical Exam: BP 123/68 mmHg  Pulse 65  Temp(Src) 98 F (36.7 C)  Ht 5' 6"  (1.676 m)  Wt 217 lb 9.6 oz (98.703 kg)  BMI 35.14 kg/m2 General:   Alert and oriented. Pleasant and cooperative. Well-nourished and well-developed.  Head:  Normocephalic and atraumatic. Eyes:  Without icterus, sclera clear and conjunctiva pink.  Ears:  Normal auditory acuity. Cardiovascular:  S1, S2 present without murmurs appreciated. Normal pulses noted. Extremities without clubbing or edema. Respiratory:  Clear to auscultation bilaterally. No wheezes, rales, or rhonchi. No distress.  Gastrointestinal:  +BS, soft, and non-distended. Mild to moderate TTP epigastric area. No HSM noted. No guarding or rebound. No  masses appreciated.  Rectal:  Deferred  Neurologic:  Alert and oriented x4;  grossly normal neurologically. Psych:  Alert and cooperative. Normal mood and affect.    11/29/2014 2:31 PM

## 2014-11-29 NOTE — Assessment & Plan Note (Signed)
Patient with history of gastric ulcer last EGD completed earlier this year showed interval enlargement of her ulcer along with satellite lesions despite Protonix 40 more grams daily. Her dose was increased to twice daily. Her symptoms are no better as per above. We will attempt to switch her to Dexilant based on insurance prior authorization. Plan for repeat endoscopy for ulcer surveillance. Return for follow-up in 3 months.  Proceed with EGD with Dr. Jena Gauss in near future: the risks, benefits, and alternatives have been discussed with the patient in detail. The patient states understanding and desires to proceed.  Previous procedure a few months ago completed under conscious sedation without combination. Conscious sedation should be adequate for this procedure. No major medication changes since then.

## 2014-11-29 NOTE — Assessment & Plan Note (Addendum)
Patient with persistent GERD symptoms as bad as prior to increasing her dose of Protonix to twice a day. At that time her gastric ulcer was found to be enlarging. Again denies any NSAID use or ASA powder use. Because her symptoms are no better on the increased dose we will send in a prior authorization for Dexilant to insurance company and change her to Dexilant once we can get it paid for. Return for follow-up in 3 months.

## 2014-12-01 ENCOUNTER — Encounter (HOSPITAL_COMMUNITY)
Admission: RE | Disposition: A | Discharge status: Home Health | Payer: Self-pay | Source: Ambulatory Visit | Attending: Internal Medicine

## 2014-12-01 ENCOUNTER — Encounter (HOSPITAL_COMMUNITY): Payer: Self-pay | Admitting: *Deleted

## 2014-12-01 ENCOUNTER — Ambulatory Visit (HOSPITAL_COMMUNITY)
Admission: RE | Admit: 2014-12-01 | Discharge: 2014-12-01 | Disposition: A | Discharge status: Home Health | Payer: Medicare HMO | Source: Ambulatory Visit | Attending: Internal Medicine | Admitting: Internal Medicine

## 2014-12-01 DIAGNOSIS — K259 Gastric ulcer, unspecified as acute or chronic, without hemorrhage or perforation: Secondary | ICD-10-CM | POA: Diagnosis not present

## 2014-12-01 DIAGNOSIS — R1013 Epigastric pain: Secondary | ICD-10-CM | POA: Insufficient documentation

## 2014-12-01 DIAGNOSIS — Z87891 Personal history of nicotine dependence: Secondary | ICD-10-CM | POA: Insufficient documentation

## 2014-12-01 DIAGNOSIS — E785 Hyperlipidemia, unspecified: Secondary | ICD-10-CM | POA: Diagnosis not present

## 2014-12-01 DIAGNOSIS — F419 Anxiety disorder, unspecified: Secondary | ICD-10-CM | POA: Insufficient documentation

## 2014-12-01 DIAGNOSIS — K257 Chronic gastric ulcer without hemorrhage or perforation: Secondary | ICD-10-CM | POA: Diagnosis not present

## 2014-12-01 DIAGNOSIS — K319 Disease of stomach and duodenum, unspecified: Secondary | ICD-10-CM | POA: Diagnosis not present

## 2014-12-01 DIAGNOSIS — Z7982 Long term (current) use of aspirin: Secondary | ICD-10-CM | POA: Insufficient documentation

## 2014-12-01 DIAGNOSIS — E039 Hypothyroidism, unspecified: Secondary | ICD-10-CM | POA: Insufficient documentation

## 2014-12-01 DIAGNOSIS — I10 Essential (primary) hypertension: Secondary | ICD-10-CM | POA: Insufficient documentation

## 2014-12-01 DIAGNOSIS — Z79899 Other long term (current) drug therapy: Secondary | ICD-10-CM | POA: Insufficient documentation

## 2014-12-01 DIAGNOSIS — J449 Chronic obstructive pulmonary disease, unspecified: Secondary | ICD-10-CM | POA: Insufficient documentation

## 2014-12-01 DIAGNOSIS — K219 Gastro-esophageal reflux disease without esophagitis: Secondary | ICD-10-CM | POA: Diagnosis not present

## 2014-12-01 DIAGNOSIS — K2960 Other gastritis without bleeding: Secondary | ICD-10-CM | POA: Diagnosis not present

## 2014-12-01 HISTORY — PX: ESOPHAGOGASTRODUODENOSCOPY: SHX5428

## 2014-12-01 SURGERY — EGD (ESOPHAGOGASTRODUODENOSCOPY)
Anesthesia: Moderate Sedation

## 2014-12-01 MED ORDER — ONDANSETRON HCL 4 MG/2ML IJ SOLN
INTRAMUSCULAR | Status: DC | PRN
  Administered 2014-12-01: 4 mg via INTRAVENOUS

## 2014-12-01 MED ORDER — SIMETHICONE 40 MG/0.6ML PO SUSP
ORAL | Status: DC | PRN
  Administered 2014-12-01: 11:00:00

## 2014-12-01 MED ORDER — LIDOCAINE VISCOUS 2 % MT SOLN
OROMUCOSAL | Status: AC
  Filled 2014-12-01: qty 15

## 2014-12-01 MED ORDER — MIDAZOLAM HCL 5 MG/5ML IJ SOLN
INTRAMUSCULAR | Status: DC | PRN
  Administered 2014-12-01: 2 mg via INTRAVENOUS
  Administered 2014-12-01: 1 mg via INTRAVENOUS

## 2014-12-01 MED ORDER — LIDOCAINE VISCOUS 2 % MT SOLN
OROMUCOSAL | Status: DC | PRN
  Administered 2014-12-01: 3 mL via OROMUCOSAL

## 2014-12-01 MED ORDER — MIDAZOLAM HCL 5 MG/5ML IJ SOLN
INTRAMUSCULAR | Status: AC
  Filled 2014-12-01: qty 10

## 2014-12-01 MED ORDER — ONDANSETRON HCL 4 MG/2ML IJ SOLN
INTRAMUSCULAR | Status: AC
  Filled 2014-12-01: qty 2

## 2014-12-01 MED ORDER — MEPERIDINE HCL 100 MG/ML IJ SOLN
INTRAMUSCULAR | Status: AC
  Filled 2014-12-01: qty 2

## 2014-12-01 MED ORDER — MEPERIDINE HCL 100 MG/ML IJ SOLN
INTRAMUSCULAR | Status: DC | PRN
  Administered 2014-12-01: 50 mg via INTRAVENOUS

## 2014-12-01 MED ORDER — SODIUM CHLORIDE 0.9 % IV SOLN
INTRAVENOUS | Status: DC
  Administered 2014-12-01: 11:00:00 via INTRAVENOUS

## 2014-12-01 NOTE — Discharge Instructions (Signed)
EGD Discharge instructions Please read the instructions outlined below and refer to this sheet in the next few weeks. These discharge instructions provide you with general information on caring for yourself after you leave the hospital. Your doctor may also give you specific instructions. While your treatment has been planned according to the most current medical practices available, unavoidable complications occasionally occur. If you have any problems or questions after discharge, please call your doctor. ACTIVITY  You may resume your regular activity but move at a slower pace for the next 24 hours.   Take frequent rest periods for the next 24 hours.   Walking will help expel (get rid of) the air and reduce the bloated feeling in your abdomen.   No driving for 24 hours (because of the anesthesia (medicine) used during the test).   You may shower.   Do not sign any important legal documents or operate any machinery for 24 hours (because of the anesthesia used during the test).  NUTRITION  Drink plenty of fluids.   You may resume your normal diet.   Begin with a light meal and progress to your normal diet.   Avoid alcoholic beverages for 24 hours or as instructed by your caregiver.  MEDICATIONS  You may resume your normal medications unless your caregiver tells you otherwise.  WHAT YOU CAN EXPECT TODAY  You may experience abdominal discomfort such as a feeling of fullness or gas pains.  FOLLOW-UP  Your doctor will discuss the results of your test with you.  SEEK IMMEDIATE MEDICAL ATTENTION IF ANY OF THE FOLLOWING OCCUR:  Excessive nausea (feeling sick to your stomach) and/or vomiting.   Severe abdominal pain and distention (swelling).   Trouble swallowing.   Temperature over 101 F (37.8 C).   Rectal bleeding or vomiting of blood.    Continue Dexilanat 60 mg daily  Carafate suspension 1 g 4 times a day  Bring the pain medication you take up in the office so we  can look at them  Further recommendations to follow pending review of pathology report Esophagogastroduodenoscopy Care After Refer to this sheet in the next few weeks. These instructions provide you with information on caring for yourself after your procedure. Your caregiver may also give you more specific instructions. Your treatment has been planned according to current medical practices, but problems sometimes occur. Call your caregiver if you have any problems or questions after your procedure.  HOME CARE INSTRUCTIONS  Do not eat or drink anything until the numbing medicine (local anesthetic) has worn off and your gag reflex has returned. You will know that the local anesthetic has worn off when you can swallow comfortably.  Do not drive for 12 hours after the procedure or as directed by your caregiver.  Only take medicines as directed by your caregiver. SEEK MEDICAL CARE IF:   You cannot stop coughing.  You are not urinating at all or less than usual. SEEK IMMEDIATE MEDICAL CARE IF:  You have difficulty swallowing.  You cannot eat or drink.  You have worsening throat or chest pain.  You have dizziness, lightheadedness, or you faint.  You have nausea or vomiting.  You have chills.  You have a fever.  You have severe abdominal pain.  You have black, tarry, or bloody stools. Document Released: 02/19/2012 Document Reviewed: 02/19/2012 [facility] Patient Information 2015 [place], [place]. This information is not intended to replace advice given to you by your health care provider. Make sure you discuss any questions you have  with your health care provider. ° °

## 2014-12-01 NOTE — Op Note (Signed)
[facility] [address] [place], [address]   ENDOSCOPY PROCEDURE REPORT  PATIENT: Connie Ponce, Connie Ponce  MR#: [id number] BIRTHDATE: 01-26-1946 , 69  yrs. old GENDER: female ENDOSCOPIST: [Referring Physician] REFERRED BY:     Strader,lindsey PROCEDURE DATE:  December 30, 2014 PROCEDURE:  EGD w/ biopsy INDICATIONS:  Dyspepsia; persisting gastric ulcer; patient denies NSAID use. MEDICATIONS: Versed 3 mg IV and Demerol 50 mg IV in divided doses. Xylocaine gel orally.  Zofran 4 mg IV. ASA CLASS:      Class II  CONSENT: The risks, benefits, limitations, alternatives and imponderables have been discussed.  The potential for biopsy, esophogeal dilation, etc. have also been reviewed.  Questions have been answered.  All parties agreeable.  Please see the history and physical in the medical record for more information.  DESCRIPTION OF PROCEDURE: After the risks benefits and alternatives of the procedure were thoroughly explained, informed consent was obtained.  The EG-[id number] ([id number]) endoscope was introduced through the mouth and advanced to the second portion of the duodenum , limited by Without limitations. The instrument was slowly withdrawn as the mucosa was fully examined. Estimated blood loss is zero unless otherwise noted in this procedure report.    Normal-appearing tubular esophagus.  Stomach empty.  Small hiatal hernia.  Linear antral erosions present.  Previously noted gastric ulceration significantly decreased in size with (2) stellate - shaped 8 mm ulcers on either side of the pyloric channel.  This appears to be a benign process and compare to prior photographs, much smaller area.  Pylorus patent and easily traversed examination of the bulb and second portion revealed a stenotic junction between the bulb and the second portion, dilated with passage of the scope.  Biopsies of the gastric ulcer taken for histologic study. The scope was then withdrawn from the  patient and the procedure completed.  COMPLICATIONS: There were no immediate complications. EBL 3 mL ENDOSCOPIC IMPRESSION  Significantly improved gastric ulceration  -  status post biopsy. Post bulbar stenosis dilated with scope. Gastric erosions. This looks like NSAID effect but the patient denies.  RECOMMENDATIONS: Continue Dexilant 60 mg daily. Add Carafate suspension 4 times a day. Patient to bring OTC pain meds to the office so we can look at them.  Follow up on pathology.  REPEAT EXAM:  eSigned:  R. Roetta Sessions, MD [facility] 30-Dec-2014 11:49 AM    CC:  CPT CODES: ICD CODES:  The ICD and CPT codes recommended by this software are interpretations from the data that the clinical staff has captured with the software.  The verification of the translation of this report to the ICD and CPT codes and modifiers is the sole responsibility of the health care institution and practicing physician where this report was generated.  [employer] will not be held responsible for the validity of the ICD and CPT codes included on this report.  AMA assumes no liability for data contained or not contained herein. CPT is a Publishing rights manager of the [employer].  PATIENT NAME:  Connie Ponce, Connie Ponce MR#: [id number]

## 2014-12-01 NOTE — H&P (View-Only) (Signed)
Referring Provider: [name], NP Primary Care Physician:  [name], NP Primary GI:  Dr. Gala Romney  Chief Complaint  Patient presents with  . Follow-up  . Abdominal Pain    HPI:   69 year old female presents for follow-up on endoscopy. Endoscopy was completed on 08/11/2014 for recurrent epigastric pain and history of peptic ulcer disease. Findings include persisting and somewhat enlarging gastric ulcer with satellite erosions, status post gastric ulcer biopsy, hiatal hernia, posterior bulbar duodenal erosions and secondary stenosis. No NSAID ingestion history. Fasting serum gastrin level ordered, Protonix increased to 40 mg twice daily. Pathology showed ulcerated benign gastric mucosa with reactive changes negative for H. pylori. Serum gastrin level showed mild elevation of 119 most consistent with PPI use.  Today she states she has been taking Protonix bid as ordered. Continues to have epigastric pain which is no better then previously. Admits occasional N/V. Also with esophageal burning, and bitter/sour taste in the mouth. Denies dental pain, excessive belching and hiccups. Denies NSAID use, only uses Tylenol prn. Denies current ASA powders; Used to use them, but not in over a year. Denies hematochezia. Admits occasional dark stools, not on iron or use of pepto bismol. Has previously tried Omeprazole. Denies chest pain, dyspnea, dizziness, lightheadedness, syncope, near syncope. Denies any other upper or lower GI symptoms.  Past Medical History  Diagnosis Date  . Hypertension   . Hyperlipidemia   . Hypothyroidism   . Anxiety   . Osteoarthritis   . COPD (chronic obstructive pulmonary disease)   . GERD (gastroesophageal reflux disease)   . Renal insufficiency   . Stroke 1990s    mild  . Pneumonia 2010  . Spinal stenosis 2010    lumbar  . Gastric ulcer     Past Surgical History  Procedure Laterality Date  . Cholecystectomy    . Appendectomy    . Abdominal  hysterectomy    . Cystectomy      cyst removed from rectal area.   . Esophagogastroduodenoscopy  01/05/2003    RMR: Normal esophagus, small hiatal hernia/ A couple of tiny antral erosions, otherwise normal stomach normal D1 and D2.   . Colonoscopy      ?2005, [place]  . Colonoscopy with esophagogastroduodenoscopy (egd)  02/11/2012    RMR: Ulcerative/erosive reflux esophagitis, benign appearing gastric ulcer with unremarkable biopsy, no H pylori. Colonoscopy was unremarkable. Next colonoscopy in 2023  . Back surgery    . Bone marrow biopsy Left 04/22/14  . Bone marrow aspiration Left 04/22/14  . Esophagogastroduodenoscopy N/A 08/11/2014    TAV:[id number] ulcer/HH    Current Outpatient Prescriptions  Medication Sig Dispense Refill  . acetaminophen (TYLENOL) 500 MG tablet Take 1,000 mg by mouth every 6 (six) hours as needed for mild pain.    Marland Kitchen aspirin EC 81 MG tablet Take 81 mg by mouth daily.    . budesonide-formoterol (SYMBICORT) 160-4.5 MCG/ACT inhaler Inhale 2 puffs into the lungs 2 (two) times daily as needed (shortness of breath).     . Cholecalciferol (VITAMIN D-3) 5000 UNITS TABS Take 1 tablet by mouth daily.    Marland Kitchen diltiazem (TIAZAC) 360 MG 24 hr capsule Take 360 mg by mouth daily.    Marland Kitchen levothyroxine (SYNTHROID, LEVOTHROID) 150 MCG tablet Take 150 mcg by mouth daily.    Marland Kitchen levothyroxine (SYNTHROID, LEVOTHROID) 175 MCG tablet Take 1 tablet (175 mcg total) by mouth daily. 30 tablet 1  . lisinopril (PRINIVIL,ZESTRIL) 40 MG tablet Take 40 mg by mouth daily.    Marland Kitchen  lubiprostone (AMITIZA) 24 MCG capsule Take 1 capsule (24 mcg total) by mouth 2 (two) times daily with a meal. 60 capsule 5  . metoprolol tartrate (LOPRESSOR) 25 MG tablet Take 25 mg by mouth 2 (two) times daily.    . Omega-3 Fatty Acids (FISH OIL) 1000 MG CAPS Take 1 capsule by mouth 2 (two) times daily.    Marland Kitchen oxyCODONE-acetaminophen (PERCOCET) 5-325 MG per tablet Take 1 tablet by mouth every 4 (four) hours as needed. (Patient  taking differently: Take 1 tablet by mouth every 4 (four) hours as needed for moderate pain. ) 10 tablet 0  . pantoprazole (PROTONIX) 40 MG tablet TAKE (1) TABLET BY MOUTH DAILY BEFORE BREAKFAST. 28 tablet 5  . potassium chloride (K-DUR) 10 MEQ tablet Take 10 mEq by mouth daily.     . pravastatin (PRAVACHOL) 40 MG tablet Take 40 mg by mouth daily.    . prednisoLONE acetate (PRED FORTE) 1 % ophthalmic suspension Administer 1 drop into the left eye Four (4) times a day.    . traZODone (DESYREL) 150 MG tablet Take 300 mg by mouth at bedtime.    . triamterene-hydrochlorothiazide (MAXZIDE-25) 37.5-25 MG per tablet Take 1 tablet by mouth daily.    . ciprofloxacin (CIPRO) 500 MG tablet Take 1 tablet (500 mg total) by mouth 2 (two) times daily. (Patient not taking: Reported on 11/29/2014) 10 tablet 0  . nicotine (NICODERM CQ - DOSED IN MG/24 HOURS) 14 mg/24hr patch Place 1 patch (14 mg total) onto the skin daily. (Patient not taking: Reported on 11/29/2014) 28 patch 0   No current facility-administered medications for this visit.    Allergies as of 11/29/2014  . (No Known Allergies)    Family History  Problem Relation Age of Onset  . Colon cancer Neg Hx   . Liver disease Neg Hx   . Other Mother     died age 31, natural causes    Social History   Social History  . Marital Status: Married    Spouse Name: N/A  . Number of Children: 3  . Years of Education: N/A   Social History Main Topics  . Smoking status: Former Smoker -- 0.50 packs/day    Types: Cigarettes    Quit date: 08/22/2014  . Smokeless tobacco: Never Used  . Alcohol Use: No  . Drug Use: No  . Sexual Activity: No   Other Topics Concern  . None   Social History Narrative    Review of Systems: General: Negative for anorexia, weight loss, fever, chills, fatigue, weakness. ENT: Negative for hoarseness, difficulty swallowing. CV: Negative for chest pain, angina, palpitations, peripheral edema.  Respiratory: Negative for  dyspnea at rest, cough, sputum, wheezing.  GI: See history of present illness. Endo: Negative for unusual weight change.  Heme: Negative for bruising or bleeding.   Physical Exam: BP 123/68 mmHg  Pulse 65  Temp(Src) 98 F (36.7 C)  Ht 5' 6"  (1.676 m)  Wt 217 lb 9.6 oz (98.703 kg)  BMI 35.14 kg/m2 General:   Alert and oriented. Pleasant and cooperative. Well-nourished and well-developed.  Head:  Normocephalic and atraumatic. Eyes:  Without icterus, sclera clear and conjunctiva pink.  Ears:  Normal auditory acuity. Cardiovascular:  S1, S2 present without murmurs appreciated. Normal pulses noted. Extremities without clubbing or edema. Respiratory:  Clear to auscultation bilaterally. No wheezes, rales, or rhonchi. No distress.  Gastrointestinal:  +BS, soft, and non-distended. Mild to moderate TTP epigastric area. No HSM noted. No guarding or rebound. No  masses appreciated.  Rectal:  Deferred  Neurologic:  Alert and oriented x4;  grossly normal neurologically. Psych:  Alert and cooperative. Normal mood and affect.    11/29/2014 2:31 PM

## 2014-12-01 NOTE — Interval H&P Note (Signed)
             History and Physical Interval Note:  12/01/2014 11:16 AM  Connie Ponce  has presented today for surgery, with the diagnosis of dyspepsia  The various methods of treatment have been discussed with the patient and family. After consideration of risks, benefits and other options for treatment, the patient has consented to  Procedure(s) with comments: ESOPHAGOGASTRODUODENOSCOPY (EGD) (N/A) - 1100 as a surgical intervention .  The patient's history has been reviewed, patient examined, no change in status, stable for surgery.  I have reviewed the patient's chart and labs.  Questions were answered to the patient's satisfaction.     Connie Ponce  No change; egd per plan.  The risks, benefits, limitations, alternatives and imponderables have been reviewed with the patient. Potential for esophageal dilation, biopsy, etc. have also been reviewed.  Questions have been answered. All parties agreeable.

## 2014-12-07 ENCOUNTER — Encounter: Payer: Self-pay | Admitting: Internal Medicine

## 2014-12-07 ENCOUNTER — Encounter (HOSPITAL_COMMUNITY): Payer: Self-pay | Admitting: Internal Medicine

## 2014-12-08 ENCOUNTER — Other Ambulatory Visit: Payer: Self-pay | Admitting: Gastroenterology

## 2014-12-08 ENCOUNTER — Telehealth: Payer: Self-pay

## 2014-12-08 NOTE — Telephone Encounter (Signed)
PT HAS APPOINTMENT 

## 2014-12-08 NOTE — Telephone Encounter (Signed)
Letter mailed to the pt. 

## 2014-12-08 NOTE — Telephone Encounter (Signed)
Per RMR-  Send letter to patient.  Send copy of letter with path to referring provider and PCP. Office visit with extender in 3 months

## 2015-01-02 NOTE — Assessment & Plan Note (Addendum)
Mild thrombocytopenia with a bone marrow aspiration and biopsy on 04/22/2014 demonstrating hypersegmented neutrophils and normal W23, iron, and folic acid levels.  Chart reviewed.  Her work-up has been complete with negative vitamin deficiencies, negative ESR, negative ANA, negative [place] for splenomegaly, negative bone marrow aspiration and biopsy.  She denies any history of jaundice.  Labs today: CBC diff, Hepatitis panel.  Thrombocytopenia is stable and ranging from 81,000- 178,000 dating back to at least 2010.  She has always had a low-normal, at best, platelet count going back to at least 2010.  She is on Protonix, Zantac, and Maxzide.  All of which can cause thrombocytopenia.  A trial of holding these medications is not indicated given stability and safety of her latest platelet counts.  Labs in 6 months: CBC diff, HIV antibody.  Return in 6 months for follow-up.  If all is well at that time and her platelet count is stable, will consider labs every 6 months with follow-up annually for 1-2 more years, followed by discharge from the clinic if no changes are appreciated.  She notes rectal pain.  She is followed by Dr. Gala Romney from a GI perspective and she has an upcoming appointment with him.  I have recommended she reach out to Dr. Roseanne Kaufman staff to see if she can be seen sooner than planned for rectal pain and change in bowel habits.

## 2015-01-02 NOTE — Progress Notes (Signed)
Connie Rival, NP [address] [place] [address]  Thrombocytopenia (HCC) - Plan: ranitidine (ZANTAC) 150 MG tablet, Hepatitis panel, acute, CBC with Differential, HIV antibody (with reflex)  CURRENT THERAPY: Observation  INTERVAL HISTORY: Connie Ponce 69 y.o. female returns for followup of mild thrombocytopenia with a bone marrow aspiration and biopsy on 04/22/2014 demonstrating hypersegmented neutrophils and normal I26, iron, and folic acid levels.  I personally reviewed and went over laboratory results with the patient.  The results are noted within this dictation.  These will be updated today.  She notes rectal pain associated with BM.  She provides a story of post-prandial BM frequency that is abnormal for her.  Her GI physician is Dr. Gala Ponce.  She denies any blood in her stool, black stool, change in caliber of stool.  She has a December appointment with Dr. Gala Ponce and I have recommended she see if she can be seen sooner than December for her rectal pain and BM changes.  She denies any gingival bleeding, epistaxis, hemoptysis, hematemesis, vaginal bleeding, rectal bleeding, hematuria, abnormal ecchymoses.  She reports that she feels good.    Past Medical History  Diagnosis Date  . Hypertension   . Hyperlipidemia   . Hypothyroidism   . Anxiety   . Osteoarthritis   . COPD (chronic obstructive pulmonary disease) ([place])   . GERD (gastroesophageal reflux disease)   . Renal insufficiency   . Stroke ([place]) 1990s    mild  . Pneumonia 2010  . Spinal stenosis 2010    lumbar  . Gastric ulcer     has RLQ abdominal pain; Constipation; GERD (gastroesophageal reflux disease); Thrombocytopenia ([place]); LUQ pain; Nausea with vomiting; Bilateral lower extremity edema; UTI (lower urinary tract infection); ARF (acute renal failure) ([place]); Toxic metabolic encephalopathy; Gastric ulcer; and Gastric ulceration on her problem list.     has No Known Allergies.  Current  Outpatient Prescriptions on File Prior to Visit  Medication Sig Dispense Refill  . acetaminophen (TYLENOL) 500 MG tablet Take 1,000 mg by mouth every 6 (six) hours as needed for mild pain.    Marland Kitchen AMITIZA 24 MCG capsule TAKE (1) CAPSULE TWICE DAILY WITH MEALS (BREAKFAST AND SUPPER). 60 capsule 5  . aspirin EC 81 MG tablet Take 81 mg by mouth daily.    . budesonide-formoterol (SYMBICORT) 160-4.5 MCG/ACT inhaler Inhale 2 puffs into the lungs 2 (two) times daily as needed (shortness of breath).     . Cholecalciferol (VITAMIN D-3) 5000 UNITS TABS Take 1 tablet by mouth daily.    Marland Kitchen diltiazem (TIAZAC) 360 MG 24 hr capsule Take 360 mg by mouth daily.    Marland Kitchen levothyroxine (SYNTHROID, LEVOTHROID) 175 MCG tablet Take 1 tablet (175 mcg total) by mouth daily. 30 tablet 1  . lisinopril (PRINIVIL,ZESTRIL) 40 MG tablet Take 40 mg by mouth daily.    . metoprolol tartrate (LOPRESSOR) 25 MG tablet Take 25 mg by mouth 2 (two) times daily.    Marland Kitchen oxyCODONE-acetaminophen (PERCOCET) 5-325 MG per tablet Take 1 tablet by mouth every 4 (four) hours as needed. (Patient taking differently: Take 1 tablet by mouth every 4 (four) hours as needed for moderate pain. ) 10 tablet 0  . pantoprazole (PROTONIX) 40 MG tablet TAKE (1) TABLET BY MOUTH DAILY BEFORE BREAKFAST. 28 tablet 5  . potassium chloride (K-DUR) 10 MEQ tablet Take 10 mEq by mouth daily.     . pravastatin (PRAVACHOL) 40 MG tablet Take 40 mg by mouth daily.    Marland Kitchen  prednisoLONE acetate (PRED FORTE) 1 % ophthalmic suspension Administer 1 drop into the left eye Four (4) times a day.    . traZODone (DESYREL) 150 MG tablet Take 300 mg by mouth at bedtime.    . triamterene-hydrochlorothiazide (MAXZIDE-25) 37.5-25 MG per tablet Take 1 tablet by mouth daily.     No current facility-administered medications on file prior to visit.    Past Surgical History  Procedure Laterality Date  . Cholecystectomy    . Appendectomy    . Abdominal hysterectomy    . Cystectomy      cyst  removed from rectal area.   . Esophagogastroduodenoscopy  01/05/2003    RMR: Normal esophagus, small hiatal hernia/ A couple of tiny antral erosions, otherwise normal stomach normal D1 and D2.   . Colonoscopy      ?2005, [place]  . Colonoscopy with esophagogastroduodenoscopy (egd)  02/11/2012    RMR: Ulcerative/erosive reflux esophagitis, benign appearing gastric ulcer with unremarkable biopsy, no H pylori. Colonoscopy was unremarkable. Next colonoscopy in 2023  . Back surgery    . Bone marrow biopsy Left 04/22/14  . Bone marrow aspiration Left 04/22/14  . Esophagogastroduodenoscopy N/A 08/11/2014    SAY:[id number] ulcer/HH  . Esophagogastroduodenoscopy N/A 12/01/2014    Procedure: ESOPHAGOGASTRODUODENOSCOPY (EGD);  Surgeon: Daneil Dolin, MD;  Location: [facility];  Service: Endoscopy;  Laterality: N/A;  1100    Denies any headaches, dizziness, double vision, fevers, chills, night sweats, nausea, vomiting, diarrhea, constipation, chest pain, heart palpitations, shortness of breath, blood in stool, black tarry stool, urinary pain, urinary burning, urinary frequency, hematuria.   PHYSICAL EXAMINATION  ECOG PERFORMANCE STATUS: 1 - Symptomatic but completely ambulatory  Filed Vitals:   01/03/15 1152  BP: 114/48  Pulse: 65  Temp: 97.7 F (36.5 C)  Resp: 16    GENERAL:alert, no distress, well nourished, well developed, comfortable, cooperative, obese, smiling and accompanied by her husband Connie Ponce SKIN: skin color, texture, turgor are normal, no rashes or significant lesions HEAD: Normocephalic, No masses, lesions, tenderness or abnormalities EYES: normal, PERRLA, EOMI, Conjunctiva are pink and non-injected EARS: External ears normal OROPHARYNX:lips, buccal mucosa, and tongue normal, mucous membranes are moist and no petechiae  NECK: supple, no adenopathy, trachea midline LYMPH:  no palpable lymphadenopathy BREAST:not examined LUNGS: clear to auscultation and percussion HEART:  regular rate & rhythm, no murmurs, no gallops, S1 normal and S2 normal ABDOMEN:abdomen soft, non-tender, obese and normal bowel sounds BACK: Back symmetric, no curvature., No CVA tenderness EXTREMITIES:less then 2 second capillary refill, no joint deformities, effusion, or inflammation, no skin discoloration, no cyanosis, no petechiae  NEURO: alert & oriented x 3 with fluent speech, no focal motor/sensory deficits, gait normal    LABORATORY DATA: CBC    Component Value Date/Time   WBC 4.2 09/21/2014 0623   RBC 3.67* 09/21/2014 0623   HGB 11.1* 09/21/2014 0623   HCT 32.7* 09/21/2014 0623   PLT 131* 09/21/2014 0623   MCV 89.1 09/21/2014 0623   MCH 30.2 09/21/2014 0623   MCHC 33.9 09/21/2014 0623   RDW 11.7 09/21/2014 0623   LYMPHSABS 0.2* 09/19/2014 0040   MONOABS 0.1 09/19/2014 0040   EOSABS 0.1 09/19/2014 0040   BASOSABS 0.0 09/19/2014 0040      Chemistry      Component Value Date/Time   NA 142 09/21/2014 0623   NA 140 01/17/2012 1155   K 3.5 09/21/2014 0623   K 3.7 01/17/2012 1155   CL 108 09/21/2014 0623   CO2 27  09/21/2014 0623   BUN 12 09/21/2014 0623   BUN 17 01/17/2012 1155   CREATININE 0.92 09/21/2014 0623   CREATININE 1.32 01/17/2012 1155      Component Value Date/Time   CALCIUM 9.2 09/21/2014 0623   CALCIUM 10.0 01/17/2012 1155   ALKPHOS 78 09/19/2014 0443   ALKPHOS 92 01/17/2012 1155   AST 20 09/19/2014 0443   AST 12 01/17/2012 1155   ALT 19 09/19/2014 0443   BILITOT 0.5 09/19/2014 0443   BILITOT 0.2 01/17/2012 1155        PENDING LABS:   RADIOGRAPHIC STUDIES:  No results found.   PATHOLOGY:    ASSESSMENT AND PLAN:  Thrombocytopenia (HCC) Mild thrombocytopenia with a bone marrow aspiration and biopsy on 04/22/2014 demonstrating hypersegmented neutrophils and normal E08, iron, and folic acid levels.  Chart reviewed.  Her work-up has been complete with negative vitamin deficiencies, negative ESR, negative ANA, negative [place] for  splenomegaly, negative bone marrow aspiration and biopsy.  She denies any history of jaundice.  Labs today: CBC diff, Hepatitis panel.  Thrombocytopenia is stable and ranging from 81,000- 178,000 dating back to at least 2010.  She has always had a low-normal, at best, platelet count going back to at least 2010.  She is on Protonix, Zantac, and Maxzide.  All of which can cause thrombocytopenia.  A trial of holding these medications is not indicated given stability and safety of her latest platelet counts.  Labs in 6 months: CBC diff, HIV antibody.  Return in 6 months for follow-up.  If all is well at that time and her platelet count is stable, will consider labs every 6 months with follow-up annually for 1-2 more years, followed by discharge from the clinic if no changes are appreciated.  She notes rectal pain.  She is followed by Dr. Gala Ponce from a GI perspective and she has an upcoming appointment with him.  I have recommended she reach out to Dr. Roseanne Kaufman staff to see if she can be seen sooner than planned for rectal pain and change in bowel habits.     THERAPY PLAN:  Surveillance  All questions were answered. The patient knows to call the clinic with any problems, questions or concerns. We can certainly see the patient much sooner if necessary.  Patient and plan discussed with Dr. Ancil Linsey and she is in agreement with the aforementioned.   This note is electronically signed by: Doy Mince 01/03/2015 12:54 PM

## 2015-01-03 ENCOUNTER — Encounter (HOSPITAL_COMMUNITY): Payer: Self-pay | Admitting: Oncology

## 2015-01-03 ENCOUNTER — Encounter (HOSPITAL_BASED_OUTPATIENT_CLINIC_OR_DEPARTMENT_OTHER): Payer: Medicare HMO

## 2015-01-03 ENCOUNTER — Ambulatory Visit (HOSPITAL_COMMUNITY): Payer: Commercial Managed Care - HMO | Admitting: Hematology & Oncology

## 2015-01-03 ENCOUNTER — Encounter (HOSPITAL_COMMUNITY): Payer: Medicare HMO | Attending: Oncology | Admitting: Oncology

## 2015-01-03 ENCOUNTER — Other Ambulatory Visit (HOSPITAL_COMMUNITY): Payer: Commercial Managed Care - HMO

## 2015-01-03 VITALS — BP 114/48 | HR 65 | Temp 97.7°F | Resp 16 | Wt 219.5 lb

## 2015-01-03 DIAGNOSIS — D696 Thrombocytopenia, unspecified: Secondary | ICD-10-CM

## 2015-01-03 LAB — CBC WITH DIFFERENTIAL/PLATELET
BASOS PCT: 1 %
Basophils Absolute: 0.1 10*3/uL (ref 0.0–0.1)
EOS ABS: 0.3 10*3/uL (ref 0.0–0.7)
EOS PCT: 6 %
HCT: 34.9 % — ABNORMAL LOW (ref 36.0–46.0)
HEMOGLOBIN: 11.8 g/dL — AB (ref 12.0–15.0)
LYMPHS ABS: 1.8 10*3/uL (ref 0.7–4.0)
LYMPHS PCT: 38 %
MCH: 29.4 pg (ref 26.0–34.0)
MCHC: 33.8 g/dL (ref 30.0–36.0)
MCV: 87 fL (ref 78.0–100.0)
Monocytes Absolute: 0.3 10*3/uL (ref 0.1–1.0)
Monocytes Relative: 5 %
NEUTROS ABS: 2.5 10*3/uL (ref 1.7–7.7)
Neutrophils Relative %: 50 %
PLATELETS: 188 10*3/uL (ref 150–400)
RBC: 4.01 MIL/uL (ref 3.87–5.11)
RDW: 12.6 % (ref 11.5–15.5)
WBC: 4.9 10*3/uL (ref 4.0–10.5)

## 2015-01-03 NOTE — Patient Instructions (Signed)
[facility] at [facility] Discharge Instructions  RECOMMENDATIONS MADE BY THE CONSULTANT AND ANY TEST RESULTS WILL BE SENT TO YOUR REFERRING PHYSICIAN.  Exam and discussion by Dellis Aneshomas Kefalas, PA-C Will check your labs today and if any concerns we will call you. Report unusual bruising or bleeding.  Follow-up in 6 months with labs and office visit.  Thank you for choosing [facility] at [facility] to provide your oncology and hematology care.  To afford each patient quality time with our provider, please arrive at least 15 minutes before your scheduled appointment time.    You need to re-schedule your appointment should you arrive 10 or more minutes late.  We strive to give you quality time with our providers, and arriving late affects you and other patients whose appointments are after yours.  Also, if you no show three or more times for appointments you may be dismissed from the clinic at the providers discretion.     Again, thank you for choosing [facility].  Our hope is that these requests will decrease the amount of time that you wait before being seen by our physicians.       _____________________________________________________________  Should you have questions after your visit to [facility], please contact our office at [phone] between the hours of 8:30 a.m. and 4:30 p.m.  Voicemails left after 4:30 p.m. will not be returned until the following business day.  For prescription refill requests, have your pharmacy contact our office.

## 2015-01-04 ENCOUNTER — Ambulatory Visit (INDEPENDENT_AMBULATORY_CARE_PROVIDER_SITE_OTHER): Payer: Medicare HMO | Admitting: Nurse Practitioner

## 2015-01-04 ENCOUNTER — Encounter: Payer: Self-pay | Admitting: Nurse Practitioner

## 2015-01-04 VITALS — BP 136/72 | HR 67 | Temp 98.1°F | Ht 64.0 in | Wt 219.6 lb

## 2015-01-04 DIAGNOSIS — R197 Diarrhea, unspecified: Secondary | ICD-10-CM

## 2015-01-04 DIAGNOSIS — K6289 Other specified diseases of anus and rectum: Secondary | ICD-10-CM

## 2015-01-04 DIAGNOSIS — K649 Unspecified hemorrhoids: Secondary | ICD-10-CM | POA: Diagnosis not present

## 2015-01-04 LAB — HEPATITIS PANEL, ACUTE
HCV Ab: <0.1 s/co ratio (ref 0.0–0.9)
Hep A IgM: NEGATIVE
Hep B C IgM: NEGATIVE
Hepatitis B Surface Ag: NEGATIVE

## 2015-01-04 MED ORDER — HYDROCORTISONE 2.5 % RE CREA: 1.0000 "application " | TOPICAL_CREAM | Freq: Every day | RECTAL | Status: DC

## 2015-01-04 MED ORDER — NITROGLYCERIN 0.4 % RE OINT: 1.0000 "application " | TOPICAL_OINTMENT | Freq: Every day | RECTAL | Status: DC

## 2015-01-04 NOTE — Assessment & Plan Note (Signed)
Hemorrhoids noted on rectal exam with possible anal fissure resulting in her rectal pain as noted below. Treatment has noted below. Return for follow-up in 2 months.

## 2015-01-04 NOTE — Progress Notes (Signed)
CC'ED TO PCP 

## 2015-01-04 NOTE — Assessment & Plan Note (Signed)
Rectal pain with noted hemorrhoid and possible tiny fissure in the rectum mucosal surface. She's been having increased stools, looser stools which are likely aggravating her mucosa and likely causing her symptoms. At this point we will send in to the pharmacy Anusol rectal cream and nitroglycerin 0.4% ointment. We will have her alternate Anusol rectal cream and the nitroglycerin ointment every 12 hours. Return for follow-up in 2 months.

## 2015-01-04 NOTE — Assessment & Plan Note (Signed)
Patient with new onset diarrhea proximally 1-2 weeks ago. Having frequent loose stools. Afebrile, no other warning signs or symptoms. This point we will give her sample cups to take a stool sample to the lab and check for infection. If no infection we can provide her with an antidiarrheal. Likely viral gastroenteritis. Frequent bowel movements are likely irritating her rectal mucosa causing her other symptoms. Return for follow-up in 2 months.

## 2015-01-04 NOTE — Progress Notes (Signed)
Referring Provider: [name], NP Primary Care Physician:  [name], NP Primary GI:  Dr. Gala Romney  Chief Complaint  Patient presents with  . Rectal Pain    HPI:   69 year old female presents for rectal pain. Last seen in our office 11/29/2014 for persistent GERD symptoms and history of gastric ulcer. EGD completed 12/01/2014 which found significantly improved gastric ulcer, postbulbar stenosis dilated with scope, gastric erosions which appeared to be NSAID affected the patient denied NSAID use. Recommended continue Dexilant 60 mg daily add Carafate 4 times a day. Pathology from biopsy showed benign ulcerative gastritis with associated reactive gastropathy negative for H. pylori.  Today she states she's been having rectal pain for a week. Food seems to go straight through her. Will have post-prandial bowel movement 5-6 times. Stools are loose. Scared to eat because of rectal pain. Denies abdominal pain. Rectal pain is sharp and afterward like spasms. Hurts "down there all the time." Denies hematochezia and melena. Denies sick contacts, changes in her diet. GERD symptoms are much improved, essentially symptomatically resolve at this point. Has tried OTC preparation H with minimal to no improvement. Has also tried sitz bath which helped somewhat. Denies chest pain, dyspnea, dizziness, lightheadedness, syncope, near syncope. Denies any other upper or lower GI symptoms.  Past Medical History  Diagnosis Date  . Hypertension   . Hyperlipidemia   . Hypothyroidism   . Anxiety   . Osteoarthritis   . COPD (chronic obstructive pulmonary disease) ([place])   . GERD (gastroesophageal reflux disease)   . Renal insufficiency   . Stroke ([place]) 1990s    mild  . Pneumonia 2010  . Spinal stenosis 2010    lumbar  . Gastric ulcer     Past Surgical History  Procedure Laterality Date  . Cholecystectomy    . Appendectomy    . Abdominal hysterectomy    . Cystectomy      cyst removed from  rectal area.   . Esophagogastroduodenoscopy  01/05/2003    RMR: Normal esophagus, small hiatal hernia/ A couple of tiny antral erosions, otherwise normal stomach normal D1 and D2.   . Colonoscopy      ?2005, [place]  . Colonoscopy with esophagogastroduodenoscopy (egd)  02/11/2012    RMR: Ulcerative/erosive reflux esophagitis, benign appearing gastric ulcer with unremarkable biopsy, no H pylori. Colonoscopy was unremarkable. Next colonoscopy in 2023  . Back surgery    . Bone marrow biopsy Left 04/22/14  . Bone marrow aspiration Left 04/22/14  . Esophagogastroduodenoscopy N/A 08/11/2014    XBL:[id number] ulcer/HH  . Esophagogastroduodenoscopy N/A 12/01/2014    QZR:[id number] improved gastric ulcerations/p bx    Current Outpatient Prescriptions  Medication Sig Dispense Refill  . acetaminophen (TYLENOL) 500 MG tablet Take 1,000 mg by mouth every 6 (six) hours as needed for mild pain.    Marland Kitchen AMITIZA 24 MCG capsule TAKE (1) CAPSULE TWICE DAILY WITH MEALS (BREAKFAST AND SUPPER). 60 capsule 5  . aspirin EC 81 MG tablet Take 81 mg by mouth daily.    . budesonide-formoterol (SYMBICORT) 160-4.5 MCG/ACT inhaler Inhale 2 puffs into the lungs 2 (two) times daily as needed (shortness of breath).     . Cholecalciferol (VITAMIN D-3) 5000 UNITS TABS Take 1 tablet by mouth daily.    Marland Kitchen diltiazem (TIAZAC) 360 MG 24 hr capsule Take 360 mg by mouth daily.    Marland Kitchen levothyroxine (SYNTHROID, LEVOTHROID) 175 MCG tablet Take 1 tablet (175 mcg total) by mouth daily. 30 tablet 1  .  lisinopril (PRINIVIL,ZESTRIL) 40 MG tablet Take 40 mg by mouth daily.    . metoprolol tartrate (LOPRESSOR) 25 MG tablet Take 25 mg by mouth 2 (two) times daily.    . pantoprazole (PROTONIX) 40 MG tablet TAKE (1) TABLET BY MOUTH DAILY BEFORE BREAKFAST. 28 tablet 5  . potassium chloride (K-DUR) 10 MEQ tablet Take 10 mEq by mouth daily.     . pravastatin (PRAVACHOL) 40 MG tablet Take 40 mg by mouth daily.    . prednisoLONE acetate (PRED  FORTE) 1 % ophthalmic suspension Administer 1 drop into the left eye Four (4) times a day.    . ranitidine (ZANTAC) 150 MG tablet Take 150 mg by mouth daily. Takes 2 in the evening.    . traZODone (DESYREL) 150 MG tablet Take 300 mg by mouth at bedtime.    . triamterene-hydrochlorothiazide (MAXZIDE-25) 37.5-25 MG per tablet Take 1 tablet by mouth daily.    Marland Kitchen oxyCODONE-acetaminophen (PERCOCET) 5-325 MG per tablet Take 1 tablet by mouth every 4 (four) hours as needed. (Patient not taking: Reported on 01/04/2015) 10 tablet 0   No current facility-administered medications for this visit.    Allergies as of 01/04/2015  . (No Known Allergies)    Family History  Problem Relation Age of Onset  . Colon cancer Neg Hx   . Liver disease Neg Hx   . Other Mother     died age 41, natural causes    Social History   Social History  . Marital Status: Married    Spouse Name: N/A  . Number of Children: 3  . Years of Education: N/A   Social History Main Topics  . Smoking status: Former Smoker -- 0.50 packs/day    Types: Cigarettes    Quit date: 08/22/2014  . Smokeless tobacco: Never Used  . Alcohol Use: No  . Drug Use: No  . Sexual Activity: No   Other Topics Concern  . None   Social History Narrative    Review of Systems: General: Negative for anorexia, weight loss, fever, chills, fatigue, weakness. ENT: Negative for hoarseness, difficulty swallowing. CV: Negative for chest pain, angina, palpitations, peripheral edema.  Respiratory: Negative for dyspnea at rest, cough, sputum, wheezing.  GI: See history of present illness. Endo: Negative for unusual weight change.  Heme: Negative for bruising or bleeding.   Physical Exam: BP 136/72 mmHg  Pulse 67  Temp(Src) 98.1 F (36.7 C) (Oral)  Ht _0  (1.626 m)  Wt 219 lb 9.6 oz (99.61 kg)  BMI 37.68 kg/m2 General:   Alert and oriented. Pleasant and cooperative. Well-nourished and well-developed.  Head:  Normocephalic and  atraumatic. Eyes:  Without icterus, sclera clear and conjunctiva pink.  Ears:  Normal auditory acuity. Cardiovascular:  S1, S2 present without murmurs appreciated.Extremities without clubbing or edema. Respiratory:  Clear to auscultation bilaterally. No wheezes, rales, or rhonchi. No distress.  Gastrointestinal:  +BS, rounded but soft, non-tender and non-distended. No HSM noted. No guarding or rebound. No masses appreciated.  Rectal:  Rectal exam with pain noted on insertion, moderate external hemorrhoid and small internal hemorrhoid noted, possible small interior fissure noted as well. Heme card negative.  Neurologic:  Alert and oriented x4;  grossly normal neurologically. Psych:  Alert and cooperative. Normal mood and affect. Heme/Lymph/Immune: No excessive bruising noted.    01/04/2015 9:54 AM

## 2015-01-04 NOTE — Patient Instructions (Signed)
1. I am sending in to medications to your pharmacy. 2. Anusol rectal cream apply once a day. 3. Nitroglycerin 0.5% ointment apply rectally internally once a day. 4. Alternate these 2 medications. For example, if he use the Anusol in the morning, used to nitroglycerin in the evening, then return to the Anusol the next morning, etc. 5. Return for follow-up in 2 months.

## 2015-01-05 NOTE — Progress Notes (Signed)
LABS DRAWN

## 2015-01-10 LAB — GIARDIA ANTIGEN: GIARDIA SCREEN (EIA): NEGATIVE

## 2015-01-10 LAB — CLOSTRIDIUM DIFFICILE BY PCR: CDIFFPCR: NOT DETECTED

## 2015-01-13 LAB — STOOL CULTURE

## 2015-01-28 ENCOUNTER — Other Ambulatory Visit: Payer: Self-pay | Admitting: Nurse Practitioner

## 2015-02-28 ENCOUNTER — Ambulatory Visit: Payer: Medicare HMO | Admitting: Nurse Practitioner

## 2015-03-21 ENCOUNTER — Other Ambulatory Visit: Payer: Self-pay

## 2015-03-21 ENCOUNTER — Encounter: Payer: Self-pay | Admitting: Nurse Practitioner

## 2015-03-21 ENCOUNTER — Ambulatory Visit (INDEPENDENT_AMBULATORY_CARE_PROVIDER_SITE_OTHER): Payer: Medicare HMO | Admitting: Nurse Practitioner

## 2015-03-21 VITALS — BP 138/64 | HR 74 | Temp 97.9°F | Ht 64.0 in | Wt 218.0 lb

## 2015-03-21 DIAGNOSIS — K6289 Other specified diseases of anus and rectum: Secondary | ICD-10-CM

## 2015-03-21 MED ORDER — PEG 3350-KCL-NA BICARB-NACL 420 G PO SOLR: 4000.0000 mL | ORAL | Status: DC

## 2015-03-21 NOTE — Patient Instructions (Signed)
1. We'll schedule your procedure for you. 2. Return for follow-up in 6 weeks.

## 2015-03-21 NOTE — Assessment & Plan Note (Addendum)
Patient having persistent rectal pain. At her last visit he was deemed likely due to hemorrhoids. However, symmetrical sternal ointment and Anusol cream has not helped her symptoms. Pain is constant, sharp. She has a history of rectal cyst requiring multiple surgical procedures to be rid of. Last colonoscopy was approximately 3 years ago which looked good with no masses hemorrhoids or recurrent cyst noted. Given her persistent and worsening rectal pain despite hemorrhoid treatment is likely prudent to proceed with a repeat colonoscopy to further evaluate. If she simply has severe hemorrhoids she could be referred for banding and/or surgical intervention. Would also be good to noted assist is not reforming. Colonoscopy is normal will likely need CT of the abdomen and pelvis to further evaluate. We will have her return for follow-up in 6 weeks to review results and make further plans.  Proceed with TCS with Dr. Jena Gaussourk in near future: the risks, benefits, and alternatives have been discussed with the patient in detail. The patient states understanding and desires to proceed.  Patient is not on any anticoagulants, chronic pain medications, or anxiolytics. She is on trazodone once daily at bedtime which she was on prior to her previous procedure as well. Last procedure was completed with conscious sedation in this should likely be adequate for her procedure.

## 2015-03-21 NOTE — Progress Notes (Signed)
cc'ed to pcp °

## 2015-03-21 NOTE — Progress Notes (Signed)
  Referring Provider: Strader, Lindsey F, NP Primary Care Physician:  Strader, Lindsey F, NP Primary GI:  Dr. Rourk  Chief Complaint  Patient presents with  . Follow-up    HPI:   70-year-old female presents for follow-up on rectal pain, hemorrhoids, diarrhea. She was last seen in our office on 01/04/2015 with the above symptoms. Diarrhea was postprandial in 5-6 times and loose. Abdominal pain with spasmy. Diarrhea was nonbloody. GERD symptoms resolved at this point. No red flag or warning signs or symptoms at that time she was deemed likely viral gastroenteritis with flareup of known hemorrhoids to explain her rectal pain. Stool studies were ordered which were all normal. She was given Anusol rectal cream and nitroglycerin ointment to alternate to help with her rectal pain.  Today she states her diarrhea is resolved. Still having rectal pain. Tried Anusol and NTG ointment without improvement. Pain is constant, improves with Sitz bath. Pain is sharp. Denies hematochezia and melena. Has had some chills, recently diagnosed with UTI and started on antibiotics by PCP, is taking probiotic to help prevent recurrence of diarrhea. No change in bowel movements, unintentional weight loss. Denies chest pain, dyspnea, dizziness, lightheadedness, syncope, near syncope. Denies any other upper or lower GI symptoms.  She states she has had surgical intervention (several) at [place] for a cystectomy in 2001 or 2002. Had to have multiple surgeries because of improper healing despite wound packing.  Past Medical History  Diagnosis Date  . Hypertension   . Hyperlipidemia   . Hypothyroidism   . Anxiety   . Osteoarthritis   . COPD (chronic obstructive pulmonary disease) (HCC)   . GERD (gastroesophageal reflux disease)   . Renal insufficiency   . Stroke (HCC) 1990s    mild  . Pneumonia 2010  . Spinal stenosis 2010    lumbar  . Gastric ulcer     Past Surgical History  Procedure Laterality Date  .  Cholecystectomy    . Appendectomy    . Abdominal hysterectomy    . Cystectomy      cyst removed from rectal area.   . Esophagogastroduodenoscopy  01/05/2003    RMR: Normal esophagus, small hiatal hernia/ A couple of tiny antral erosions, otherwise normal stomach normal D1 and D2.   . Colonoscopy      ?2005, [facility]  . Colonoscopy with esophagogastroduodenoscopy (egd)  02/11/2012    RMR: Ulcerative/erosive reflux esophagitis, benign appearing gastric ulcer with unremarkable biopsy, no H pylori. Colonoscopy was unremarkable. Next colonoscopy in 2023  . Back surgery    . Bone marrow biopsy Left 04/22/14  . Bone marrow aspiration Left 04/22/14  . Esophagogastroduodenoscopy N/A 08/11/2014    RMR:gastric ulcer/HH  . Esophagogastroduodenoscopy N/A 12/01/2014    RMR:Significantly improved gastric ulcerations/p bx    Current Outpatient Prescriptions  Medication Sig Dispense Refill  . acetaminophen (TYLENOL) 500 MG tablet Take 1,000 mg by mouth every 6 (six) hours as needed for mild pain.    . AMITIZA 24 MCG capsule TAKE (1) CAPSULE TWICE DAILY WITH MEALS (BREAKFAST AND SUPPER). 60 capsule 5  . aspirin EC 81 MG tablet Take 81 mg by mouth daily.    . budesonide-formoterol (SYMBICORT) 160-4.5 MCG/ACT inhaler Inhale 2 puffs into the lungs 2 (two) times daily as needed (shortness of breath).     . Cholecalciferol (VITAMIN D-3) 5000 UNITS TABS Take 1 tablet by mouth daily.    . diltiazem (TIAZAC) 360 MG 24 hr capsule Take 360 mg by mouth daily.    .   hydrocortisone (ANUSOL-HC) 2.5 % rectal cream Place 1 application rectally daily. Alternate with nitroglycerin ointment 30 g 1  . levothyroxine (SYNTHROID, LEVOTHROID) 175 MCG tablet Take 1 tablet (175 mcg total) by mouth daily. 30 tablet 1  . lisinopril (PRINIVIL,ZESTRIL) 40 MG tablet Take 40 mg by mouth daily.    . metoprolol tartrate (LOPRESSOR) 25 MG tablet Take 25 mg by mouth 2 (two) times daily.    . Nitroglycerin 0.4 % OINT Place 1  application rectally daily. Alternate with Anusol cream 1 Tube 0  . pantoprazole (PROTONIX) 40 MG tablet TAKE (1) TABLET BY MOUTH DAILY BEFORE BREAKFAST. 28 tablet 5  . potassium chloride (K-DUR) 10 MEQ tablet Take 10 mEq by mouth daily.     . prednisoLONE acetate (PRED FORTE) 1 % ophthalmic suspension Administer 1 drop into the left eye Four (4) times a day.    . ranitidine (ZANTAC) 150 MG tablet Take 150 mg by mouth daily. Takes 2 in the evening.    . traZODone (DESYREL) 150 MG tablet Take 300 mg by mouth at bedtime.    . triamterene-hydrochlorothiazide (MAXZIDE-25) 37.5-25 MG per tablet Take 1 tablet by mouth daily.    . DEXILANT 60 MG capsule TAKE (1) CAPSULE BY MOUTH ONCE DAILY. 30 capsule 2  . oxyCODONE-acetaminophen (PERCOCET) 5-325 MG per tablet Take 1 tablet by mouth every 4 (four) hours as needed. (Patient not taking: Reported on 01/04/2015) 10 tablet 0  . pravastatin (PRAVACHOL) 40 MG tablet Take 40 mg by mouth daily.     No current facility-administered medications for this visit.    Allergies as of 03/21/2015  . (No Known Allergies)    Family History  Problem Relation Age of Onset  . Colon cancer Neg Hx   . Liver disease Neg Hx   . Other Mother     died age 98, natural causes    Social History   Social History  . Marital Status: Married    Spouse Name: N/A  . Number of Children: 3  . Years of Education: N/A   Social History Main Topics  . Smoking status: Former Smoker -- 0.50 packs/day    Types: Cigarettes    Quit date: 08/22/2014  . Smokeless tobacco: Never Used  . Alcohol Use: No  . Drug Use: No  . Sexual Activity: No   Other Topics Concern  . None   Social History Narrative    Review of Systems: General: Negative for anorexia, weight loss, fever, chills, fatigue, weakness. Eyes: Negative for vision changes.  ENT: Negative for hoarseness, difficulty swallowing. CV: Negative for chest pain, angina, palpitations, peripheral edema.  Respiratory:  Negative for dyspnea at rest, cough, sputum, wheezing.  GI: See history of present illness. Endo: Negative for unusual weight change.   Physical Exam: BP 138/64 mmHg  Pulse 74  Temp(Src) 97.9 F (36.6 C)  Ht 5' 4" (1.626 m)  Wt 218 lb (98.884 kg)  BMI 37.40 kg/m2 General:   Alert and oriented. Pleasant and cooperative. Well-nourished and well-developed.  Head:  Normocephalic and atraumatic. Eyes:  Without icterus, sclera clear and conjunctiva pink.  Ears:  Normal auditory acuity. Cardiovascular:  S1, S2 present without murmurs appreciated. Normal pulses noted. Extremities without clubbing or edema. Respiratory:  Clear to auscultation bilaterally. No wheezes, rales, or rhonchi. No distress.  Gastrointestinal:  +BS, soft, non-tender and non-distended. No HSM noted. No guarding or rebound. No masses appreciated.  Rectal:  Deferred  Neurologic:  Alert and oriented x4;  grossly normal neurologically.   Psych:  Alert and cooperative. Normal mood and affect.    03/21/2015 10:57 AM  

## 2015-04-07 ENCOUNTER — Encounter (HOSPITAL_COMMUNITY)
Admission: RE | Disposition: A | Discharge status: Home / Self Care | Payer: Self-pay | Source: Ambulatory Visit | Attending: Internal Medicine

## 2015-04-07 ENCOUNTER — Ambulatory Visit (HOSPITAL_COMMUNITY)
Admission: RE | Admit: 2015-04-07 | Discharge: 2015-04-07 | Disposition: A | Discharge status: Home / Self Care | Payer: Medicare HMO | Source: Ambulatory Visit | Attending: Internal Medicine | Admitting: Internal Medicine

## 2015-04-07 ENCOUNTER — Encounter (HOSPITAL_COMMUNITY): Payer: Self-pay | Admitting: *Deleted

## 2015-04-07 DIAGNOSIS — K6289 Other specified diseases of anus and rectum: Secondary | ICD-10-CM | POA: Diagnosis not present

## 2015-04-07 DIAGNOSIS — K219 Gastro-esophageal reflux disease without esophagitis: Secondary | ICD-10-CM | POA: Diagnosis not present

## 2015-04-07 DIAGNOSIS — E039 Hypothyroidism, unspecified: Secondary | ICD-10-CM | POA: Diagnosis not present

## 2015-04-07 DIAGNOSIS — I10 Essential (primary) hypertension: Secondary | ICD-10-CM | POA: Diagnosis not present

## 2015-04-07 DIAGNOSIS — Z87891 Personal history of nicotine dependence: Secondary | ICD-10-CM | POA: Diagnosis not present

## 2015-04-07 DIAGNOSIS — Z79899 Other long term (current) drug therapy: Secondary | ICD-10-CM | POA: Diagnosis not present

## 2015-04-07 DIAGNOSIS — E785 Hyperlipidemia, unspecified: Secondary | ICD-10-CM | POA: Diagnosis not present

## 2015-04-07 DIAGNOSIS — J449 Chronic obstructive pulmonary disease, unspecified: Secondary | ICD-10-CM | POA: Diagnosis not present

## 2015-04-07 DIAGNOSIS — Z7982 Long term (current) use of aspirin: Secondary | ICD-10-CM | POA: Diagnosis not present

## 2015-04-07 HISTORY — PX: COLONOSCOPY: SHX5424

## 2015-04-07 SURGERY — COLONOSCOPY
Anesthesia: Moderate Sedation

## 2015-04-07 MED ORDER — MIDAZOLAM HCL 5 MG/5ML IJ SOLN
INTRAMUSCULAR | Status: DC | PRN
  Administered 2015-04-07: 1 mg via INTRAVENOUS
  Administered 2015-04-07: 2 mg via INTRAVENOUS
  Administered 2015-04-07: 1 mg via INTRAVENOUS
  Administered 2015-04-07: 2 mg via INTRAVENOUS

## 2015-04-07 MED ORDER — STERILE WATER FOR IRRIGATION IR SOLN
Status: DC | PRN
  Administered 2015-04-07: 10:00:00

## 2015-04-07 MED ORDER — ONDANSETRON HCL 4 MG/2ML IJ SOLN
INTRAMUSCULAR | Status: DC | PRN
  Administered 2015-04-07: 4 mg via INTRAVENOUS

## 2015-04-07 MED ORDER — SODIUM CHLORIDE 0.9 % IV SOLN
INTRAVENOUS | Status: DC
  Administered 2015-04-07: 1000 mL via INTRAVENOUS

## 2015-04-07 MED ORDER — MEPERIDINE HCL 100 MG/ML IJ SOLN
INTRAMUSCULAR | Status: DC | PRN
  Administered 2015-04-07: 50 mg via INTRAVENOUS
  Administered 2015-04-07 (×2): 25 mg via INTRAVENOUS

## 2015-04-07 MED ORDER — ONDANSETRON HCL 4 MG/2ML IJ SOLN
INTRAMUSCULAR | Status: AC
  Filled 2015-04-07: qty 2

## 2015-04-07 MED ORDER — MEPERIDINE HCL 100 MG/ML IJ SOLN
INTRAMUSCULAR | Status: AC
  Filled 2015-04-07: qty 2

## 2015-04-07 MED ORDER — MIDAZOLAM HCL 5 MG/5ML IJ SOLN
INTRAMUSCULAR | Status: AC
  Filled 2015-04-07: qty 10

## 2015-04-07 NOTE — Op Note (Signed)
[facility] [address] [place], [address]   COLONOSCOPY PROCEDURE REPORT  PATIENT: Connie, Ponce  MR#: [id number] BIRTHDATE: 03-Mar-1946 , 69  yrs. old GENDER: female ENDOSCOPIST: R.  Roetta Sessions, MD FACP [facility] REFERRED WN:[id number] Shonna Chock, NP PROCEDURE DATE:  20-Apr-2015 PROCEDURE:   Ileo-colonoscopy, diagnostic INDICATIONS:unrelenting proctalgia. MEDICATIONS: Versed 6 mg IV and Demerol 100 mg IV in divided doses per Zofran 4 mg IV. ASA CLASS:       Class II  CONSENT: The risks, benefits, alternatives and imponderables including but not limited to bleeding, perforation as well as the possibility of a missed lesion have been reviewed.  The potential for biopsy, lesion removal, etc. have also been discussed. Questions have been answered.  All parties agreeable.  Please see the history and physical in the medical record for more information.  DESCRIPTION OF PROCEDURE:   After the risks benefits and alternatives of the procedure were thoroughly explained, informed consent was obtained.  The digital rectal exam revealed no abnormalities of the rectum.   The EC-[id number] ([id number])  endoscope was introduced through the anus and advanced to the terminal ileum which was intubated for a short distance. No adverse events experienced.   The quality of the prep was adequate  The instrument was then slowly withdrawn as the colon was fully examined. Estimated blood loss is zero unless otherwise noted in this procedure report.      COLON FINDINGS: Normal-appearing rectal mucosa.  Normal-appearing colonic mucosa.  The distal 5 cm of terminal ileual mucosa appeared normal.  Retroflexion was performed. .  Withdrawal time=7 minutes 0 seconds.  The scope was withdrawn and the procedure completed. COMPLICATIONS: There were no immediate complications.  ENDOSCOPIC IMPRESSION: Normal ileo-colonoscopy. Symptoms do not sound like a fissure?"and she has not responded to  empiric treatment of a fissure. Moreover, the symptoms don't sound typical for an abscess or proctalgia fugax or levator anis syndrome  RECOMMENDATIONS: We'll proceed with a contrast CT of the pelvis to further evaluate.  eSigned:  R. Roetta Sessions, MD Jerrel Ivory [facility] 20-Apr-2015 10:18 AM   cc:  CPT CODES: ICD CODES:  The ICD and CPT codes recommended by this software are interpretations from the data that the clinical staff has captured with the software.  The verification of the translation of this report to the ICD and CPT codes and modifiers is the sole responsibility of the health care institution and practicing physician where this report was generated.  [employer] will not be held responsible for the validity of the ICD and CPT codes included on this report.  AMA assumes no liability for data contained or not contained herein. CPT is a Publishing rights manager of the [employer].  PATIENT NAME:  Connie, Ponce MR#: [id number]

## 2015-04-07 NOTE — H&P (View-Only) (Signed)
Referring Provider: [name], NP Primary Care Physician:  [name], NP Primary GI:  Dr. Gala Romney  Chief Complaint  Patient presents with  . Follow-up    HPI:   70 year old female presents for follow-up on rectal pain, hemorrhoids, diarrhea. She was last seen in our office on 01/04/2015 with the above symptoms. Diarrhea was postprandial in 5-6 times and loose. Abdominal pain with spasmy. Diarrhea was nonbloody. GERD symptoms resolved at this point. No red flag or warning signs or symptoms at that time she was deemed likely viral gastroenteritis with flareup of known hemorrhoids to explain her rectal pain. Stool studies were ordered which were all normal. She was given Anusol rectal cream and nitroglycerin ointment to alternate to help with her rectal pain.  Today she states her diarrhea is resolved. Still having rectal pain. Tried Anusol and NTG ointment without improvement. Pain is constant, improves with Sitz bath. Pain is sharp. Denies hematochezia and melena. Has had some chills, recently diagnosed with UTI and started on antibiotics by PCP, is taking probiotic to help prevent recurrence of diarrhea. No change in bowel movements, unintentional weight loss. Denies chest pain, dyspnea, dizziness, lightheadedness, syncope, near syncope. Denies any other upper or lower GI symptoms.  She states she has had surgical intervention (several) at [facility] for a cystectomy in 2001 or 2002. Had to have multiple surgeries because of improper healing despite wound packing.  Past Medical History  Diagnosis Date  . Hypertension   . Hyperlipidemia   . Hypothyroidism   . Anxiety   . Osteoarthritis   . COPD (chronic obstructive pulmonary disease) ([place])   . GERD (gastroesophageal reflux disease)   . Renal insufficiency   . Stroke ([place]) 1990s    mild  . Pneumonia 2010  . Spinal stenosis 2010    lumbar  . Gastric ulcer     Past Surgical History  Procedure Laterality Date  .  Cholecystectomy    . Appendectomy    . Abdominal hysterectomy    . Cystectomy      cyst removed from rectal area.   . Esophagogastroduodenoscopy  01/05/2003    RMR: Normal esophagus, small hiatal hernia/ A couple of tiny antral erosions, otherwise normal stomach normal D1 and D2.   . Colonoscopy      ?2005, [place]  . Colonoscopy with esophagogastroduodenoscopy (egd)  02/11/2012    RMR: Ulcerative/erosive reflux esophagitis, benign appearing gastric ulcer with unremarkable biopsy, no H pylori. Colonoscopy was unremarkable. Next colonoscopy in 2023  . Back surgery    . Bone marrow biopsy Left 04/22/14  . Bone marrow aspiration Left 04/22/14  . Esophagogastroduodenoscopy N/A 08/11/2014    QIW:[id number] ulcer/HH  . Esophagogastroduodenoscopy N/A 12/01/2014    JHE:[id number] improved gastric ulcerations/p bx    Current Outpatient Prescriptions  Medication Sig Dispense Refill  . acetaminophen (TYLENOL) 500 MG tablet Take 1,000 mg by mouth every 6 (six) hours as needed for mild pain.    Marland Kitchen AMITIZA 24 MCG capsule TAKE (1) CAPSULE TWICE DAILY WITH MEALS (BREAKFAST AND SUPPER). 60 capsule 5  . aspirin EC 81 MG tablet Take 81 mg by mouth daily.    . budesonide-formoterol (SYMBICORT) 160-4.5 MCG/ACT inhaler Inhale 2 puffs into the lungs 2 (two) times daily as needed (shortness of breath).     . Cholecalciferol (VITAMIN D-3) 5000 UNITS TABS Take 1 tablet by mouth daily.    Marland Kitchen diltiazem (TIAZAC) 360 MG 24 hr capsule Take 360 mg by mouth daily.    Marland Kitchen  hydrocortisone (ANUSOL-HC) 2.5 % rectal cream Place 1 application rectally daily. Alternate with nitroglycerin ointment 30 g 1  . levothyroxine (SYNTHROID, LEVOTHROID) 175 MCG tablet Take 1 tablet (175 mcg total) by mouth daily. 30 tablet 1  . lisinopril (PRINIVIL,ZESTRIL) 40 MG tablet Take 40 mg by mouth daily.    . metoprolol tartrate (LOPRESSOR) 25 MG tablet Take 25 mg by mouth 2 (two) times daily.    . Nitroglycerin 0.4 % OINT Place 1  application rectally daily. Alternate with Anusol cream 1 Tube 0  . pantoprazole (PROTONIX) 40 MG tablet TAKE (1) TABLET BY MOUTH DAILY BEFORE BREAKFAST. 28 tablet 5  . potassium chloride (K-DUR) 10 MEQ tablet Take 10 mEq by mouth daily.     . prednisoLONE acetate (PRED FORTE) 1 % ophthalmic suspension Administer 1 drop into the left eye Four (4) times a day.    . ranitidine (ZANTAC) 150 MG tablet Take 150 mg by mouth daily. Takes 2 in the evening.    . traZODone (DESYREL) 150 MG tablet Take 300 mg by mouth at bedtime.    . triamterene-hydrochlorothiazide (MAXZIDE-25) 37.5-25 MG per tablet Take 1 tablet by mouth daily.    Marland Kitchen DEXILANT 60 MG capsule TAKE (1) CAPSULE BY MOUTH ONCE DAILY. 30 capsule 2  . oxyCODONE-acetaminophen (PERCOCET) 5-325 MG per tablet Take 1 tablet by mouth every 4 (four) hours as needed. (Patient not taking: Reported on 01/04/2015) 10 tablet 0  . pravastatin (PRAVACHOL) 40 MG tablet Take 40 mg by mouth daily.     No current facility-administered medications for this visit.    Allergies as of 03/21/2015  . (No Known Allergies)    Family History  Problem Relation Age of Onset  . Colon cancer Neg Hx   . Liver disease Neg Hx   . Other Mother     died age 44, natural causes    Social History   Social History  . Marital Status: Married    Spouse Name: N/A  . Number of Children: 3  . Years of Education: N/A   Social History Main Topics  . Smoking status: Former Smoker -- 0.50 packs/day    Types: Cigarettes    Quit date: 08/22/2014  . Smokeless tobacco: Never Used  . Alcohol Use: No  . Drug Use: No  . Sexual Activity: No   Other Topics Concern  . None   Social History Narrative    Review of Systems: General: Negative for anorexia, weight loss, fever, chills, fatigue, weakness. Eyes: Negative for vision changes.  ENT: Negative for hoarseness, difficulty swallowing. CV: Negative for chest pain, angina, palpitations, peripheral edema.  Respiratory:  Negative for dyspnea at rest, cough, sputum, wheezing.  GI: See history of present illness. Endo: Negative for unusual weight change.   Physical Exam: BP 138/64 mmHg  Pulse 74  Temp(Src) 97.9 F (36.6 C)  Ht _0  (1.626 m)  Wt 218 lb (98.884 kg)  BMI 37.40 kg/m2 General:   Alert and oriented. Pleasant and cooperative. Well-nourished and well-developed.  Head:  Normocephalic and atraumatic. Eyes:  Without icterus, sclera clear and conjunctiva pink.  Ears:  Normal auditory acuity. Cardiovascular:  S1, S2 present without murmurs appreciated. Normal pulses noted. Extremities without clubbing or edema. Respiratory:  Clear to auscultation bilaterally. No wheezes, rales, or rhonchi. No distress.  Gastrointestinal:  +BS, soft, non-tender and non-distended. No HSM noted. No guarding or rebound. No masses appreciated.  Rectal:  Deferred  Neurologic:  Alert and oriented x4;  grossly normal neurologically.  Psych:  Alert and cooperative. Normal mood and affect.    03/21/2015 10:57 AM

## 2015-04-07 NOTE — Interval H&P Note (Signed)
History and Physical Interval Note:  04/07/2015 9:42 AM  Connie Ponce  has presented today for surgery, with the diagnosis of rectal pain  The various methods of treatment have been discussed with the patient and family. After consideration of risks, benefits and other options for treatment, the patient has consented to  Procedure(s) with comments: COLONOSCOPY (N/A) - 1000 as a surgical intervention .  The patient's history has been reviewed, patient examined, no change in status, stable for surgery.  I have reviewed the patient's chart and labs.  Questions were answered to the patient's satisfaction.     Connie Ponce   No change. Diagnostic colonoscopy per plan.The risks, benefits, limitations, alternatives and imponderables have been reviewed with the patient. Questions have been answered. All parties are agreeable.

## 2015-04-07 NOTE — Discharge Instructions (Signed)
°  Colonoscopy Discharge Instructions  Read the instructions outlined below and refer to this sheet in the next few weeks. These discharge instructions provide you with general information on caring for yourself after you leave the hospital. Your doctor may also give you specific instructions. While your treatment has been planned according to the most current medical practices available, unavoidable complications occasionally occur. If you have any problems or questions after discharge, call Dr. Jena Gauss at [phone]. ACTIVITY  You may resume your regular activity, but move at a slower pace for the next 24 hours.   Take frequent rest periods for the next 24 hours.   Walking will help get rid of the air and reduce the bloated feeling in your belly (abdomen).   No driving for 24 hours (because of the medicine (anesthesia) used during the test).    Do not sign any important legal documents or operate any machinery for 24 hours (because of the anesthesia used during the test).  NUTRITION  Drink plenty of fluids.   You may resume your normal diet as instructed by your doctor.   Begin with a light meal and progress to your normal diet. Heavy or fried foods are harder to digest and may make you feel sick to your stomach (nauseated).   Avoid alcoholic beverages for 24 hours or as instructed.  MEDICATIONS  You may resume your normal medications unless your doctor tells you otherwise.  WHAT YOU CAN EXPECT TODAY  Some feelings of bloating in the abdomen.   Passage of more gas than usual.   Spotting of blood in your stool or on the toilet paper.  IF YOU HAD POLYPS REMOVED DURING THE COLONOSCOPY:  No aspirin products for 7 days or as instructed.   No alcohol for 7 days or as instructed.   Eat a soft diet for the next 24 hours.  FINDING OUT THE RESULTS OF YOUR TEST Not all test results are available during your visit. If your test results are not back during the visit, make an appointment  with your caregiver to find out the results. Do not assume everything is normal if you have not heard from your caregiver or the medical facility. It is important for you to follow up on all of your test results.  SEEK IMMEDIATE MEDICAL ATTENTION IF:  You have more than a spotting of blood in your stool.   Your belly is swollen (abdominal distention).   You are nauseated or vomiting.   You have a temperature over 101.   You have abdominal pain or discomfort that is severe or gets worse throughout the day.   Will proceed with a CT of the pelvis only with IV and oral contrast to further evaluate unrelenting proctalgia  Further recommendations to follow

## 2015-04-10 ENCOUNTER — Other Ambulatory Visit: Payer: Self-pay

## 2015-04-10 DIAGNOSIS — K6289 Other specified diseases of anus and rectum: Secondary | ICD-10-CM

## 2015-04-11 ENCOUNTER — Ambulatory Visit (HOSPITAL_COMMUNITY)
Admission: RE | Admit: 2015-04-11 | Discharge: 2015-04-11 | Disposition: A | Discharge status: Home / Self Care | Payer: Medicare HMO | Source: Ambulatory Visit | Attending: Internal Medicine | Admitting: Internal Medicine

## 2015-04-11 ENCOUNTER — Encounter (HOSPITAL_COMMUNITY): Payer: Self-pay | Admitting: Internal Medicine

## 2015-04-11 DIAGNOSIS — K6289 Other specified diseases of anus and rectum: Secondary | ICD-10-CM | POA: Diagnosis present

## 2015-04-11 DIAGNOSIS — K603 Anal fistula: Secondary | ICD-10-CM | POA: Insufficient documentation

## 2015-04-11 LAB — POCT I-STAT CREATININE: CREATININE: 1.1 mg/dL — AB (ref 0.44–1.00)

## 2015-04-11 MED ORDER — IOHEXOL 300 MG/ML  SOLN
100.0000 mL | Freq: Once | INTRAMUSCULAR | Status: AC | PRN
  Administered 2015-04-11: 100 mL via INTRAVENOUS

## 2015-04-13 ENCOUNTER — Other Ambulatory Visit: Payer: Self-pay

## 2015-04-13 DIAGNOSIS — K603 Anal fistula: Secondary | ICD-10-CM

## 2015-04-21 DIAGNOSIS — R339 Retention of urine, unspecified: Secondary | ICD-10-CM | POA: Insufficient documentation

## 2015-04-21 DIAGNOSIS — N393 Stress incontinence (female) (male): Secondary | ICD-10-CM | POA: Insufficient documentation

## 2015-04-21 DIAGNOSIS — R350 Frequency of micturition: Secondary | ICD-10-CM | POA: Insufficient documentation

## 2015-04-21 DIAGNOSIS — N3941 Urge incontinence: Secondary | ICD-10-CM | POA: Insufficient documentation

## 2015-04-21 DIAGNOSIS — N3020 Other chronic cystitis without hematuria: Secondary | ICD-10-CM | POA: Insufficient documentation

## 2015-04-22 ENCOUNTER — Other Ambulatory Visit: Payer: Self-pay | Admitting: Nurse Practitioner

## 2015-05-02 ENCOUNTER — Other Ambulatory Visit: Payer: Self-pay | Admitting: General Surgery

## 2015-05-02 DIAGNOSIS — K6289 Other specified diseases of anus and rectum: Secondary | ICD-10-CM

## 2015-05-04 ENCOUNTER — Encounter: Payer: Self-pay | Admitting: Nurse Practitioner

## 2015-05-04 ENCOUNTER — Ambulatory Visit (INDEPENDENT_AMBULATORY_CARE_PROVIDER_SITE_OTHER): Payer: Medicare HMO | Admitting: Nurse Practitioner

## 2015-05-04 VITALS — BP 183/85 | HR 98 | Temp 97.6°F | Ht 64.0 in | Wt 221.2 lb

## 2015-05-04 DIAGNOSIS — K6289 Other specified diseases of anus and rectum: Secondary | ICD-10-CM

## 2015-05-04 DIAGNOSIS — K5900 Constipation, unspecified: Secondary | ICD-10-CM

## 2015-05-04 NOTE — Assessment & Plan Note (Signed)
Patient with occasional hard stools and constipation. This is likely aggravating her rectal pain symptoms. Recommend daily Colace stool softener to help promote softer bowel movements.

## 2015-05-04 NOTE — Progress Notes (Signed)
Referring Provider: [name], NP Primary Care Physician:  [name], NP Primary GI:  Dr. Gala Romney  Chief Complaint  Patient presents with  . Rectal Pain    HPI:   Connie Ponce is a 70 y.o. female who presents for follow-up on rectal pain. She was last seen in our office 03/21/2015 at which point she noted resolution of her diarrhea but continued rectal pain. Tried Anusol without improvement, some improvement with sitz bath. At that point she was referred for colonoscopy. Colonoscopy was completed on 04/07/2015 and found normal ileocolonoscopy. Also noted symptoms not sounding indicative of fissure, not responded to empiric treatment of fissure, also not typical for abscess or proctalgia fugax, or levator anis syndrome. 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 Referral was made to [facility] surgery and the patient saw them on 05/01/2015. They recommended MRI for further evaluation of fistula versus scar tissue. That imaging exam is scheduled for next week.  Today she states she is still having rectal pain, is persistent/constant, worse with bowel movements. Getting harder to deal with. Denies hematochezia and melena. Occasional lower abdominal pain, bowel movement daily sometimes soft, sometimes constipated/heard stools. Not on a stool softener. Denies chest pain, dyspnea, dizziness, lightheadedness, syncope, near syncope. Denies any other upper or lower GI symptoms.  Past Medical History  Diagnosis Date  . Hypertension   . Hyperlipidemia   . Hypothyroidism   . Anxiety   . Osteoarthritis   . COPD (chronic obstructive pulmonary disease) ([place])   . GERD (gastroesophageal reflux disease)   . Renal insufficiency   . Stroke ([place]) 1990s    mild  . Pneumonia 2010  . Spinal stenosis 2010    lumbar  . Gastric ulcer     Past Surgical History  Procedure Laterality Date  . Cholecystectomy    . Appendectomy     . Abdominal hysterectomy    . Cystectomy      cyst removed from rectal area.   . Esophagogastroduodenoscopy  01/05/2003    RMR: Normal esophagus, small hiatal hernia/ A couple of tiny antral erosions, otherwise normal stomach normal D1 and D2.   . Colonoscopy      ?2005, [place]  . Colonoscopy with esophagogastroduodenoscopy (egd)  02/11/2012    RMR: Ulcerative/erosive reflux esophagitis, benign appearing gastric ulcer with unremarkable biopsy, no H pylori. Colonoscopy was unremarkable. Next colonoscopy in 2023  . Back surgery    . Bone marrow biopsy Left 04/22/14  . Bone marrow aspiration Left 04/22/14  . Esophagogastroduodenoscopy N/A 08/11/2014    ZXY:[id number] ulcer/HH  . Esophagogastroduodenoscopy N/A 12/01/2014    RJP:[id number] improved gastric ulcerations/p bx  . Joint replacement Right     knee  . Colonoscopy N/A 04/07/2015    HHI:[id number]    Current Outpatient Prescriptions  Medication Sig Dispense Refill  . acetaminophen (TYLENOL) 500 MG tablet Take 1,000 mg by mouth every 6 (six) hours as needed for mild pain.    Marland Kitchen AMITIZA 24 MCG capsule TAKE (1) CAPSULE TWICE DAILY WITH MEALS (BREAKFAST AND SUPPER). 60 capsule 5  . aspirin EC 81 MG tablet Take 81 mg by mouth daily.    . budesonide-formoterol (SYMBICORT) 160-4.5 MCG/ACT inhaler Inhale 2 puffs into the lungs 2 (two) times daily as needed (shortness of breath).     . Cholecalciferol (VITAMIN D-3) 5000 UNITS TABS Take 1 tablet by mouth daily.    Marland Kitchen DEXILANT 60 MG capsule TAKE (1) CAPSULE  BY MOUTH ONCE DAILY. 28 capsule 5  . diltiazem (TIAZAC) 360 MG 24 hr capsule Take 360 mg by mouth daily.    Marland Kitchen levothyroxine (SYNTHROID, LEVOTHROID) 175 MCG tablet Take 1 tablet (175 mcg total) by mouth daily. 30 tablet 1  . lisinopril (PRINIVIL,ZESTRIL) 40 MG tablet Take 40 mg by mouth daily.    . metoprolol tartrate (LOPRESSOR) 25 MG tablet Take 25 mg by mouth 2 (two) times daily.    . pantoprazole (PROTONIX) 40 MG tablet TAKE (1)  TABLET BY MOUTH DAILY BEFORE BREAKFAST. 28 tablet 5  . potassium chloride (K-DUR) 10 MEQ tablet Take 10 mEq by mouth daily.     . pravastatin (PRAVACHOL) 40 MG tablet Take 40 mg by mouth daily.    . ranitidine (ZANTAC) 150 MG tablet Take 150 mg by mouth daily. Takes 2 in the evening.    . traZODone (DESYREL) 150 MG tablet Take 300 mg by mouth at bedtime.    . triamterene-hydrochlorothiazide (MAXZIDE-25) 37.5-25 MG per tablet Take 1 tablet by mouth daily.    . hydrocortisone (ANUSOL-HC) 2.5 % rectal cream Place 1 application rectally daily. Alternate with nitroglycerin ointment (Patient not taking: Reported on 05/04/2015) 30 g 1  . Nitroglycerin 0.4 % OINT Place 1 application rectally daily. Alternate with Anusol cream (Patient not taking: Reported on 05/04/2015) 1 Tube 0  . polyethylene glycol-electrolytes (TRILYTE) 420 g solution Take 4,000 mLs by mouth as directed. (Patient not taking: Reported on 05/04/2015) 4000 mL 0  . prednisoLONE acetate (PRED FORTE) 1 % ophthalmic suspension Reported on 05/04/2015     No current facility-administered medications for this visit.    Allergies as of 05/04/2015  . (No Known Allergies)    Family History  Problem Relation Age of Onset  . Colon cancer Neg Hx   . Liver disease Neg Hx   . Other Mother     died age 25, natural causes    Social History   Social History  . Marital Status: Married    Spouse Name: N/A  . Number of Children: 3  . Years of Education: N/A   Social History Main Topics  . Smoking status: Current Some Day Smoker -- 0.25 packs/day    Types: Cigarettes    Last Attempt to Quit: 08/22/2014  . Smokeless tobacco: Never Used  . Alcohol Use: No  . Drug Use: No  . Sexual Activity: No   Other Topics Concern  . None   Social History Narrative    Review of Systems: General: Negative for anorexia, weight loss, fever, chills, fatigue, weakness. Eyes: Negative for vision changes.  ENT: Negative for hoarseness, difficulty  swallowing. CV: Negative for chest pain, angina, palpitations, peripheral edema.  Respiratory: Negative for dyspnea at rest, cough, sputum, wheezing.  GI: See history of present illness. Endo: Negative for unusual weight change.  Heme: Negative for bruising or bleeding.   Physical Exam: BP 183/85 mmHg  Pulse 98  Temp(Src) 97.6 F (36.4 C)  Ht 5' 4"  (1.626 m)  Wt 221 lb 3.2 oz (100.336 kg)  BMI 37.95 kg/m2 General:   Alert and oriented. Pleasant and cooperative. Well-nourished and well-developed.  Head:  Normocephalic and atraumatic. Cardiovascular:  S1, S2 present without murmurs appreciated. Extremities without clubbing or edema. Respiratory:  Clear to auscultation bilaterally. No wheezes, rales, or rhonchi. No distress.  Gastrointestinal:  +BS, rounded but soft, non-tender and non-distended. No HSM noted. No guarding or rebound. No masses appreciated.  Rectal:  Deferred  Neurologic:  Alert and  oriented x4;  grossly normal neurologically. Psych:  Alert and cooperative. Normal mood and affect..    05/04/2015 9:58 AM

## 2015-05-04 NOTE — Patient Instructions (Signed)
1. Have your MRI done when scheduled. 2. Follow-up with the surgeon. 3. Use LMX 5 over-the-counter lidocaine 5% cream 3-4 times a day as needed for rectal pain. 4. Return for follow-up here in 4 months, or sooner based on surgical recommendations. 5. Take a daily Colace stool softener to help keep her stool soft.

## 2015-05-04 NOTE — Assessment & Plan Note (Signed)
Patient with persistent rectal pain concerning for fistula per CT the abdomen and pelvis. Has seen surgery and they are questioning whether it is true fistula versus scar tissue from previous surgeries. They've ordered an MRI which is scheduled for next week and I will see her afterward. Recommend continue follow-up with surgery. Use LMX 5 over-the-counter 5% lidocaine cream to the area 3-4 times a day as needed for rectal pain. Return for follow-up here in 4 months or sooner based on surgical recommendations.

## 2015-05-08 NOTE — Progress Notes (Signed)
cc'ed to pcp °

## 2015-05-10 ENCOUNTER — Inpatient Hospital Stay: Admission: RE | Admit: 2015-05-10 | Payer: Medicare HMO | Source: Ambulatory Visit

## 2015-05-20 ENCOUNTER — Other Ambulatory Visit: Payer: Self-pay | Admitting: Nurse Practitioner

## 2015-05-25 ENCOUNTER — Ambulatory Visit
Admission: RE | Admit: 2015-05-25 | Discharge: 2015-05-25 | Disposition: A | Discharge status: Home / Self Care | Payer: Medicare HMO | Source: Ambulatory Visit | Attending: General Surgery | Admitting: General Surgery

## 2015-05-25 DIAGNOSIS — K6289 Other specified diseases of anus and rectum: Secondary | ICD-10-CM

## 2015-05-25 MED ORDER — GADOBENATE DIMEGLUMINE 529 MG/ML IV SOLN
20.0000 mL | Freq: Once | INTRAVENOUS | Status: AC | PRN
  Administered 2015-05-25: 20 mL via INTRAVENOUS

## 2015-07-03 DIAGNOSIS — D649 Anemia, unspecified: Secondary | ICD-10-CM | POA: Insufficient documentation

## 2015-07-03 NOTE — Assessment & Plan Note (Addendum)
Mild thrombocytopenia with a bone marrow aspiration and biopsy on 04/22/2014 demonstrating hypersegmented neutrophils and normal D80, iron, and folic acid levels.  Chart reviewed.  Her work-up has been complete with negative vitamin deficiencies, negative ESR, negative ANA, negative [place] for splenomegaly, negative bone marrow aspiration and biopsy.  She denies any history of jaundice.  Labs today: CBC diff, HIV antibody, iron/TIBC, ferritin, B12, and folate.  Thrombocytopenia is stable and ranging from 81,000- 188,000 dating back to at least 2010.  She has always had a low-normal, at best, platelet count going back to at least 2010.  She is on Protonix, Zantac, and Maxzide.  All of which can cause thrombocytopenia.  A trial of holding these medications is not indicated given stability and safety of her latest platelet counts.  CBC today is WNL and her platelet count over the past year has been mildly improved with two instances of normal platelet count over the past 12 months.   Additional anemia labs were drawn today based upon her labs HGB values, but today, no indication of anemia of CBC.   Labs in 6 and 12 months: CBC diff.  Return in 12 months for follow-up.  She knows we will see her sooner if necessary and we can perform labs at any time if needed.

## 2015-07-03 NOTE — Assessment & Plan Note (Addendum)
Normocytic, normochromic anemia, stable since July 2016.  Labs today: CBC diff, anemia panel, retic count, pathologist smear review.  CBC today is WNL.

## 2015-07-03 NOTE — Progress Notes (Signed)
Connie Rival, NP [address] [place] [address]  Thrombocytopenia (HCC) - Plan: Vitamin B12, Folate, Iron and TIBC, Ferritin, Pathologist smear review  Anemia, unspecified anemia type - Plan: Vitamin B12, Folate, Iron and TIBC, Ferritin, Reticulocytes, Pathologist smear review  CURRENT THERAPY: Observation  INTERVAL HISTORY: Connie Ponce 70 y.o. female returns for followup of mild thrombocytopenia with a bone marrow aspiration and biopsy on 04/22/2014 demonstrating hypersegmented neutrophils and normal K56, iron, and folic acid levels.  I personally reviewed and went over laboratory results with the patient.  The results are noted within this dictation.  These will be updated today.  She has been seen by GI since our last encounter for rectal pain and change in bowel habits.  She underwent colonoscopy by Dr. Gala Romney on 04/07/2015 which was normal in appearance.  This was followed by imaging studies.  I personally reviewed and went over radiographic studies with the patient.  The results are noted within this dictation.  She underwent MRI imaging on 05/25/2015 that was negative for a fistula or abscess but did demonstrate chronic post-operative change/scarring from previous rectal cancer.  She denies any gingival bleeding, epistaxis, hemoptysis, hematemesis, vaginal bleeding, rectal bleeding, hematuria, abnormal ecchymoses.  She reports that she feels good.  She denies any medication changes.    Past Medical History  Diagnosis Date  . Hypertension   . Hyperlipidemia   . Hypothyroidism   . Anxiety   . Osteoarthritis   . COPD (chronic obstructive pulmonary disease) ([place])   . GERD (gastroesophageal reflux disease)   . Renal insufficiency   . Stroke ([place]) 1990s    mild  . Pneumonia 2010  . Spinal stenosis 2010    lumbar  . Gastric ulcer     has RLQ abdominal pain; Constipation; GERD (gastroesophageal reflux disease); Thrombocytopenia ([place]); LUQ pain; Nausea  with vomiting; Bilateral lower extremity edema; UTI (lower urinary tract infection); ARF (acute renal failure) ([place]); Toxic metabolic encephalopathy; Gastric ulcer; Gastric ulceration; Rectal pain; Hemorrhoids; Diarrhea; Proctalgia; and Absolute anemia on her problem list.     has No Known Allergies.  Current Outpatient Prescriptions on File Prior to Visit  Medication Sig Dispense Refill  . acetaminophen (TYLENOL) 500 MG tablet Take 1,000 mg by mouth every 6 (six) hours as needed for mild pain.    Marland Kitchen AMITIZA 24 MCG capsule TAKE (1) CAPSULE BY MOUTH TWICE DAILY WITH FOOD. 60 capsule 11  . aspirin EC 81 MG tablet Take 81 mg by mouth daily.    . budesonide-formoterol (SYMBICORT) 160-4.5 MCG/ACT inhaler Inhale 2 puffs into the lungs 2 (two) times daily as needed (shortness of breath).     . Cholecalciferol (VITAMIN D-3) 5000 UNITS TABS Take 1 tablet by mouth daily.    Marland Kitchen DEXILANT 60 MG capsule TAKE (1) CAPSULE BY MOUTH ONCE DAILY. 28 capsule 5  . diltiazem (TIAZAC) 360 MG 24 hr capsule Take 360 mg by mouth daily.    Marland Kitchen levothyroxine (SYNTHROID, LEVOTHROID) 175 MCG tablet Take 1 tablet (175 mcg total) by mouth daily. 30 tablet 1  . lisinopril (PRINIVIL,ZESTRIL) 40 MG tablet Take 40 mg by mouth daily.    . metoprolol tartrate (LOPRESSOR) 25 MG tablet Take 25 mg by mouth 2 (two) times daily.    . pantoprazole (PROTONIX) 40 MG tablet TAKE (1) TABLET BY MOUTH DAILY BEFORE BREAKFAST. 28 tablet 5  . potassium chloride (K-DUR) 10 MEQ tablet Take 10 mEq by mouth daily.     Marland Kitchen  pravastatin (PRAVACHOL) 40 MG tablet Take 40 mg by mouth daily.    . ranitidine (ZANTAC) 150 MG tablet Take 150 mg by mouth daily. Takes 2 in the evening.    . traZODone (DESYREL) 150 MG tablet Take 300 mg by mouth at bedtime.    . triamterene-hydrochlorothiazide (MAXZIDE-25) 37.5-25 MG per tablet Take 1 tablet by mouth daily.     No current facility-administered medications on file prior to visit.    Past Surgical History  Procedure  Laterality Date  . Cholecystectomy    . Appendectomy    . Abdominal hysterectomy    . Cystectomy      cyst removed from rectal area.   . Esophagogastroduodenoscopy  01/05/2003    RMR: Normal esophagus, small hiatal hernia/ A couple of tiny antral erosions, otherwise normal stomach normal D1 and D2.   . Colonoscopy      ?2005, [place]  . Colonoscopy with esophagogastroduodenoscopy (egd)  02/11/2012    RMR: Ulcerative/erosive reflux esophagitis, benign appearing gastric ulcer with unremarkable biopsy, no H pylori. Colonoscopy was unremarkable. Next colonoscopy in 2023  . Back surgery    . Bone marrow biopsy Left 04/22/14  . Bone marrow aspiration Left 04/22/14  . Esophagogastroduodenoscopy N/A 08/11/2014    TMA:[id number] ulcer/HH  . Esophagogastroduodenoscopy N/A 12/01/2014    TGY:[id number] improved gastric ulcerations/p bx  . Joint replacement Right     knee  . Colonoscopy N/A 04/07/2015    OTL:[id number]    Denies any headaches, dizziness, double vision, fevers, chills, night sweats, nausea, vomiting, diarrhea, constipation, chest pain, heart palpitations, shortness of breath, blood in stool, black tarry stool, urinary pain, urinary burning, urinary frequency, hematuria.   PHYSICAL EXAMINATION  ECOG PERFORMANCE STATUS: 1 - Symptomatic but completely ambulatory  Filed Vitals:   07/04/15 0953  BP: 118/53  Pulse: 82  Temp: 99.1 F (37.3 C)    GENERAL:alert, no distress, well nourished, well developed, comfortable, cooperative, obese, smiling and accompanied by her husband Jeneen Rinks SKIN: skin color, texture, turgor are normal, no rashes or significant lesions HEAD: Normocephalic, No masses, lesions, tenderness or abnormalities EYES: normal, PERRLA, EOMI, Conjunctiva are pink and non-injected EARS: External ears normal OROPHARYNX:lips, buccal mucosa, and tongue normal, mucous membranes are moist and no petechiae  NECK: supple, no adenopathy, trachea midline LYMPH:  no palpable  lymphadenopathy BREAST:not examined LUNGS: clear to auscultation and percussion without wheezes, rales, or rhonchi. HEART: regular rate & rhythm, no murmurs, no gallops, S1 normal and S2 normal ABDOMEN:abdomen soft, non-tender, obese and normal bowel sounds BACK: Back symmetric, no curvature., No CVA tenderness EXTREMITIES:less then 2 second capillary refill, no joint deformities, effusion, or inflammation, no skin discoloration, no cyanosis, no petechiae  NEURO: alert & oriented x 3 with fluent speech, no focal motor/sensory deficits, gait normal    LABORATORY DATA: CBC    Component Value Date/Time   WBC 5.2 07/04/2015 0916   RBC 4.28 07/04/2015 0916   RBC 4.28 07/04/2015 0916   HGB 13.1 07/04/2015 0916   HCT 39.1 07/04/2015 0916   PLT 210 07/04/2015 0916   MCV 91.4 07/04/2015 0916   MCH 30.6 07/04/2015 0916   MCHC 33.5 07/04/2015 0916   RDW 12.7 07/04/2015 0916   LYMPHSABS 2.0 07/04/2015 0916   MONOABS 0.4 07/04/2015 0916   EOSABS 0.5 07/04/2015 0916   BASOSABS 0.0 07/04/2015 0916      Chemistry      Component Value Date/Time   NA 142 09/21/2014 0623   NA 140 01/17/2012 1155  K 3.5 09/21/2014 0623   K 3.7 01/17/2012 1155   CL 108 09/21/2014 0623   CO2 27 09/21/2014 0623   BUN 12 09/21/2014 0623   BUN 17 01/17/2012 1155   CREATININE 1.10* 04/11/2015 1504   CREATININE 1.32 01/17/2012 1155      Component Value Date/Time   CALCIUM 9.2 09/21/2014 0623   CALCIUM 10.0 01/17/2012 1155   ALKPHOS 78 09/19/2014 0443   ALKPHOS 92 01/17/2012 1155   AST 20 09/19/2014 0443   AST 12 01/17/2012 1155   ALT 19 09/19/2014 0443   BILITOT 0.5 09/19/2014 0443   BILITOT 0.2 01/17/2012 1155        PENDING LABS:   RADIOGRAPHIC STUDIES:  No results found.   PATHOLOGY:    ASSESSMENT AND PLAN:  Thrombocytopenia (HCC) Mild thrombocytopenia with a bone marrow aspiration and biopsy on 04/22/2014 demonstrating hypersegmented neutrophils and normal N47, iron, and folic acid  levels.  Chart reviewed.  Her work-up has been complete with negative vitamin deficiencies, negative ESR, negative ANA, negative [place] for splenomegaly, negative bone marrow aspiration and biopsy.  She denies any history of jaundice.  Labs today: CBC diff, HIV antibody, iron/TIBC, ferritin, B12, and folate.  Thrombocytopenia is stable and ranging from 81,000- 188,000 dating back to at least 2010.  She has always had a low-normal, at best, platelet count going back to at least 2010.  She is on Protonix, Zantac, and Maxzide.  All of which can cause thrombocytopenia.  A trial of holding these medications is not indicated given stability and safety of her latest platelet counts.  CBC today is WNL and her platelet count over the past year has been mildly improved with two instances of normal platelet count over the past 12 months.   Additional anemia labs were drawn today based upon her labs HGB values, but today, no indication of anemia of CBC.   Labs in 6 and 12 months: CBC diff.  Return in 12 months for follow-up.  She knows we will see her sooner if necessary and we can perform labs at any time if needed.  Absolute anemia Normocytic, normochromic anemia, stable since July 2016.  Labs today: CBC diff, anemia panel, retic count, pathologist smear review.  CBC today is WNL.    THERAPY PLAN:  We will continue to monitor her counts.  All questions were answered. The patient knows to call the clinic with any problems, questions or concerns. We can certainly see the patient much sooner if necessary.  Patient and plan discussed with Dr. Ancil Linsey and she is in agreement with the aforementioned.   This note is electronically signed by: Doy Mince 07/04/2015 10:41 AM

## 2015-07-04 ENCOUNTER — Encounter (HOSPITAL_COMMUNITY): Payer: Medicare HMO

## 2015-07-04 ENCOUNTER — Encounter (HOSPITAL_COMMUNITY): Payer: Medicare HMO | Attending: Oncology | Admitting: Oncology

## 2015-07-04 ENCOUNTER — Encounter (HOSPITAL_COMMUNITY): Payer: Self-pay | Admitting: Oncology

## 2015-07-04 ENCOUNTER — Other Ambulatory Visit (HOSPITAL_COMMUNITY): Payer: Medicare HMO

## 2015-07-04 ENCOUNTER — Ambulatory Visit (HOSPITAL_COMMUNITY): Payer: Medicare HMO | Admitting: Oncology

## 2015-07-04 VITALS — BP 118/53 | HR 82 | Temp 99.1°F | Wt 212.6 lb

## 2015-07-04 DIAGNOSIS — D696 Thrombocytopenia, unspecified: Secondary | ICD-10-CM | POA: Diagnosis present

## 2015-07-04 DIAGNOSIS — Z79899 Other long term (current) drug therapy: Secondary | ICD-10-CM | POA: Diagnosis not present

## 2015-07-04 DIAGNOSIS — Z9889 Other specified postprocedural states: Secondary | ICD-10-CM | POA: Insufficient documentation

## 2015-07-04 DIAGNOSIS — K219 Gastro-esophageal reflux disease without esophagitis: Secondary | ICD-10-CM | POA: Insufficient documentation

## 2015-07-04 DIAGNOSIS — J449 Chronic obstructive pulmonary disease, unspecified: Secondary | ICD-10-CM | POA: Diagnosis not present

## 2015-07-04 DIAGNOSIS — Z85048 Personal history of other malignant neoplasm of rectum, rectosigmoid junction, and anus: Secondary | ICD-10-CM | POA: Insufficient documentation

## 2015-07-04 DIAGNOSIS — Z7982 Long term (current) use of aspirin: Secondary | ICD-10-CM | POA: Insufficient documentation

## 2015-07-04 DIAGNOSIS — Z8673 Personal history of transient ischemic attack (TIA), and cerebral infarction without residual deficits: Secondary | ICD-10-CM | POA: Diagnosis not present

## 2015-07-04 DIAGNOSIS — F419 Anxiety disorder, unspecified: Secondary | ICD-10-CM | POA: Diagnosis not present

## 2015-07-04 DIAGNOSIS — E785 Hyperlipidemia, unspecified: Secondary | ICD-10-CM | POA: Insufficient documentation

## 2015-07-04 DIAGNOSIS — M1990 Unspecified osteoarthritis, unspecified site: Secondary | ICD-10-CM | POA: Insufficient documentation

## 2015-07-04 DIAGNOSIS — I10 Essential (primary) hypertension: Secondary | ICD-10-CM | POA: Diagnosis not present

## 2015-07-04 DIAGNOSIS — Z8719 Personal history of other diseases of the digestive system: Secondary | ICD-10-CM | POA: Diagnosis not present

## 2015-07-04 DIAGNOSIS — E039 Hypothyroidism, unspecified: Secondary | ICD-10-CM | POA: Diagnosis not present

## 2015-07-04 DIAGNOSIS — D649 Anemia, unspecified: Secondary | ICD-10-CM | POA: Diagnosis present

## 2015-07-04 LAB — CBC WITH DIFFERENTIAL/PLATELET
Basophils Absolute: 0 10*3/uL (ref 0.0–0.1)
Basophils Relative: 1 %
Eosinophils Absolute: 0.5 10*3/uL (ref 0.0–0.7)
Eosinophils Relative: 9 %
HEMATOCRIT: 39.1 % (ref 36.0–46.0)
HEMOGLOBIN: 13.1 g/dL (ref 12.0–15.0)
LYMPHS ABS: 2 10*3/uL (ref 0.7–4.0)
LYMPHS PCT: 38 %
MCH: 30.6 pg (ref 26.0–34.0)
MCHC: 33.5 g/dL (ref 30.0–36.0)
MCV: 91.4 fL (ref 78.0–100.0)
MONOS PCT: 8 %
Monocytes Absolute: 0.4 10*3/uL (ref 0.1–1.0)
NEUTROS ABS: 2.3 10*3/uL (ref 1.7–7.7)
NEUTROS PCT: 44 %
Platelets: 210 10*3/uL (ref 150–400)
RBC: 4.28 MIL/uL (ref 3.87–5.11)
RDW: 12.7 % (ref 11.5–15.5)
WBC: 5.2 10*3/uL (ref 4.0–10.5)

## 2015-07-04 LAB — RETICULOCYTES
RBC.: 4.28 MIL/uL (ref 3.87–5.11)
RETIC CT PCT: 1.4 % (ref 0.4–3.1)
Retic Count, Absolute: 59.9 10*3/uL (ref 19.0–186.0)

## 2015-07-04 LAB — VITAMIN B12: VITAMIN B 12: 467 pg/mL (ref 180–914)

## 2015-07-04 LAB — IRON AND TIBC
Iron: 71 ug/dL (ref 28–170)
SATURATION RATIOS: 23 % (ref 10.4–31.8)
TIBC: 309 ug/dL (ref 250–450)
UIBC: 238 ug/dL

## 2015-07-04 LAB — FOLATE: Folate: 24 ng/mL (ref >5.9)

## 2015-07-04 LAB — FERRITIN: FERRITIN: 72 ng/mL (ref 11–307)

## 2015-07-04 NOTE — Patient Instructions (Signed)
[facility] at [facility] Discharge Instructions  RECOMMENDATIONS MADE BY THE CONSULTANT AND ANY TEST RESULTS WILL BE SENT TO YOUR REFERRING PHYSICIAN.  Labs thus far are within normal limits.  We will check labs in 6 months and 12 months.  You will return in 12 months for follow-up.  Sooner if necessary.  CBC    Component Value Date/Time   WBC 5.2 07/04/2015 0916   RBC 4.28 07/04/2015 0916   RBC 4.28 07/04/2015 0916   HGB 13.1 07/04/2015 0916   HCT 39.1 07/04/2015 0916   PLT 210 07/04/2015 0916   MCV 91.4 07/04/2015 0916   MCH 30.6 07/04/2015 0916   MCHC 33.5 07/04/2015 0916   RDW 12.7 07/04/2015 0916   LYMPHSABS 2.0 07/04/2015 0916   MONOABS 0.4 07/04/2015 0916   EOSABS 0.5 07/04/2015 0916   BASOSABS 0.0 07/04/2015 0916      Thank you for choosing [facility] at [facility] to provide your oncology and hematology care.  To afford each patient quality time with our provider, please arrive at least 15 minutes before your scheduled appointment time.   Beginning January 23rd 2017 lab work for the [facility] will be done in the  Main lab at [facility] on 1st floor. If you have a lab appointment with the Cancer Center please come in thru the  Main Entrance and check in at the main information desk  You need to re-schedule your appointment should you arrive 10 or more minutes late.  We strive to give you quality time with our providers, and arriving late affects you and other patients whose appointments are after yours.  Also, if you no show three or more times for appointments you may be dismissed from the clinic at the providers discretion.     Again, thank you for choosing [facility].  Our hope is that these requests will decrease the amount of time that you wait before being seen by our physicians.       _____________________________________________________________  Should you have questions after your visit to [facility], please contact our office at [phone] between the hours of 8:30 a.m. and 4:30 p.m.  Voicemails left after 4:30 p.m. will not be returned until the following business day.  For prescription refill requests, have your pharmacy contact our office.         Resources For Cancer Patients and their Caregivers ? American Cancer Society: Can assist with transportation, wigs, general needs, runs Look Good Feel Better.        231-881-20851-365 501 2004 ? Cancer Care: Provides financial assistance, online support groups, medication/co-pay assistance.  1-800-813-HOPE 947-349-3193(4673) ? [facility] Assists [place] cancer patients and their families through emotional , educational and financial support.  339-496-9326610-140-0211 ? [place] DSS Where to apply for food stamps, Medicaid and utility assistance. 9383788649726-340-0909 ? [facility]: Transportation to medical appointments. ([phone] ? Social Security Administration: May apply for disability if have a Stage IV cancer. [phone] ? [place] [facility] [employer] [employer] [facility]: Assists with nutrition, care and transit needs. [phone]

## 2015-07-05 LAB — HIV ANTIBODY (ROUTINE TESTING W REFLEX): HIV SCREEN 4TH GENERATION: NONREACTIVE

## 2015-07-15 ENCOUNTER — Encounter (HOSPITAL_COMMUNITY): Payer: Self-pay | Admitting: Emergency Medicine

## 2015-07-15 ENCOUNTER — Emergency Department (HOSPITAL_COMMUNITY)
Admission: EM | Admit: 2015-07-15 | Discharge: 2015-07-15 | Disposition: A | Discharge status: Home / Self Care | Payer: Medicare HMO | Attending: Emergency Medicine | Admitting: Emergency Medicine

## 2015-07-15 DIAGNOSIS — I10 Essential (primary) hypertension: Secondary | ICD-10-CM | POA: Diagnosis not present

## 2015-07-15 DIAGNOSIS — M1990 Unspecified osteoarthritis, unspecified site: Secondary | ICD-10-CM | POA: Diagnosis not present

## 2015-07-15 DIAGNOSIS — Z79899 Other long term (current) drug therapy: Secondary | ICD-10-CM | POA: Diagnosis not present

## 2015-07-15 DIAGNOSIS — E039 Hypothyroidism, unspecified: Secondary | ICD-10-CM | POA: Diagnosis not present

## 2015-07-15 DIAGNOSIS — J449 Chronic obstructive pulmonary disease, unspecified: Secondary | ICD-10-CM | POA: Insufficient documentation

## 2015-07-15 DIAGNOSIS — K6289 Other specified diseases of anus and rectum: Secondary | ICD-10-CM | POA: Diagnosis present

## 2015-07-15 DIAGNOSIS — F17210 Nicotine dependence, cigarettes, uncomplicated: Secondary | ICD-10-CM | POA: Insufficient documentation

## 2015-07-15 DIAGNOSIS — E785 Hyperlipidemia, unspecified: Secondary | ICD-10-CM | POA: Insufficient documentation

## 2015-07-15 DIAGNOSIS — B3789 Other sites of candidiasis: Secondary | ICD-10-CM | POA: Insufficient documentation

## 2015-07-15 DIAGNOSIS — Z8673 Personal history of transient ischemic attack (TIA), and cerebral infarction without residual deficits: Secondary | ICD-10-CM | POA: Insufficient documentation

## 2015-07-15 MED ORDER — NYSTATIN 100000 UNIT/GM EX CREA: TOPICAL_CREAM | CUTANEOUS | Status: DC

## 2015-07-15 NOTE — ED Notes (Signed)
PT c/o rectal pain and vaginal discomfort with no relief from diflucan medications prescribed by PCP this week. PT denies any blood noted in BM and denies any urinary symptoms.

## 2015-07-15 NOTE — ED Provider Notes (Signed)
CSN: [id number]     Arrival date & time 07/15/15  1505 History   First MD Initiated Contact with Patient 07/15/15 1523     Chief Complaint  Patient presents with  . Rectal Pain     (Consider location/radiation/quality/duration/timing/severity/associated sxs/prior Treatment) HPI   69 year old female with pain to her vaginal rectal area. Gradual onset a few days ago. Constant. Sometimes worse with movements. Also itching. Prescribed Diflucan by her PCP this week. She took one dose was given prescription for another dose to take one week later. She first noticed yesterday. Since then. No urinary complaints. No fevers or chills.  Past Medical History  Diagnosis Date  . Hypertension   . Hyperlipidemia   . Hypothyroidism   . Anxiety   . Osteoarthritis   . COPD (chronic obstructive pulmonary disease) ([place])   . GERD (gastroesophageal reflux disease)   . Renal insufficiency   . Stroke ([place]) 1990s    mild  . Pneumonia 2010  . Spinal stenosis 2010    lumbar  . Gastric ulcer    Past Surgical History  Procedure Laterality Date  . Cholecystectomy    . Appendectomy    . Abdominal hysterectomy    . Cystectomy      cyst removed from rectal area.   . Esophagogastroduodenoscopy  01/05/2003    RMR: Normal esophagus, small hiatal hernia/ A couple of tiny antral erosions, otherwise normal stomach normal D1 and D2.   . Colonoscopy      ?2005, [place]  . Colonoscopy with esophagogastroduodenoscopy (egd)  02/11/2012    RMR: Ulcerative/erosive reflux esophagitis, benign appearing gastric ulcer with unremarkable biopsy, no H pylori. Colonoscopy was unremarkable. Next colonoscopy in 2023  . Back surgery    . Bone marrow biopsy Left 04/22/14  . Bone marrow aspiration Left 04/22/14  . Esophagogastroduodenoscopy N/A 08/11/2014    QQI:[id number] ulcer/HH  . Esophagogastroduodenoscopy N/A 12/01/2014    JJH:[id number] improved gastric ulcerations/p bx  . Joint replacement Right     knee  .  Colonoscopy N/A 04/07/2015    UDJ:[id number]   Family History  Problem Relation Age of Onset  . Colon cancer Neg Hx   . Liver disease Neg Hx   . Other Mother     died age 60, natural causes   Social History  Substance Use Topics  . Smoking status: Current Some Day Smoker -- 0.25 packs/day    Types: Cigarettes    Last Attempt to Quit: 08/22/2014  . Smokeless tobacco: Never Used  . Alcohol Use: No   OB History    Gravida Para Term Preterm AB TAB SAB Ectopic Multiple Living   4         3     Review of Systems  All systems reviewed and negative, other than as noted in HPI.   Allergies  Shellfish-derived products  Home Medications   Prior to Admission medications   Medication Sig Start Date End Date Taking? Authorizing Provider  acetaminophen (TYLENOL) 500 MG tablet Take 1,000 mg by mouth every 6 (six) hours as needed for mild pain.   Yes Historical Provider, MD  AMITIZA 24 MCG capsule TAKE (1) CAPSULE BY MOUTH TWICE DAILY WITH FOOD. 05/22/15  Yes Mahala Menghini, PA-C  aspirin EC 81 MG tablet Take 81 mg by mouth daily.   Yes Historical Provider, MD  budesonide-formoterol (SYMBICORT) 160-4.5 MCG/ACT inhaler Inhale 2 puffs into the lungs 2 (two) times daily as needed (shortness of breath).    Yes Historical Provider,  MD  Cholecalciferol (VITAMIN D-3) 5000 UNITS TABS Take 1 tablet by mouth daily.   Yes Historical Provider, MD  DEXILANT 60 MG capsule TAKE (1) CAPSULE BY MOUTH ONCE DAILY. 04/25/15  Yes Orvil Feil, NP  diltiazem (TIAZAC) 360 MG 24 hr capsule Take 360 mg by mouth daily.   Yes Historical Provider, MD  fluconazole (DIFLUCAN) 150 MG tablet Take 150 mg by mouth stat. Then repeat in  5 to 7 days starting on 07/14/2015 07/14/15  Yes Historical Provider, MD  imipramine (TOFRANIL) 25 MG tablet Take 25 mg by mouth daily. 07/14/15  Yes Historical Provider, MD  levothyroxine (SYNTHROID, LEVOTHROID) 175 MCG tablet Take 1 tablet (175 mcg total) by mouth daily. 09/21/14  Yes Orvan Falconer, MD   lisinopril (PRINIVIL,ZESTRIL) 40 MG tablet Take 40 mg by mouth daily.   Yes Historical Provider, MD  metoprolol tartrate (LOPRESSOR) 25 MG tablet Take 25 mg by mouth 2 (two) times daily.   Yes Historical Provider, MD  oxyCODONE-acetaminophen (PERCOCET/ROXICET) 5-325 MG tablet 1 tablet every 6 (six) hours as needed for moderate pain or severe pain.  06/09/15  Yes Historical Provider, MD  potassium chloride (K-DUR) 10 MEQ tablet Take 10 mEq by mouth daily.  07/08/14  Yes Historical Provider, MD  pravastatin (PRAVACHOL) 40 MG tablet Take 40 mg by mouth daily.   Yes Historical Provider, MD  ranitidine (ZANTAC) 150 MG tablet Take 300 mg by mouth at bedtime. Takes 2 in the evening. 12/08/14  Yes Historical Provider, MD  traZODone (DESYREL) 150 MG tablet Take 300 mg by mouth at bedtime.   Yes Historical Provider, MD  triamterene-hydrochlorothiazide (MAXZIDE-25) 37.5-25 MG per tablet Take 1 tablet by mouth daily.   Yes Historical Provider, MD  nystatin cream (MYCOSTATIN) Apply to affected area 2 times daily 07/15/15   Virgel Manifold, MD   BP 128/51 mmHg  Pulse 78  Temp(Src) 98.8 F (37.1 C) (Oral)  Resp 18  Ht '5\' 4"'$  (1.626 m)  Wt 212 lb (96.163 kg)  BMI 36.37 kg/m2  SpO2 98% Physical Exam  Constitutional: She appears well-developed and well-nourished. No distress.  HENT:  Head: Normocephalic and atraumatic.  Eyes: Conjunctivae are normal. Right eye exhibits no discharge. Left eye exhibits no discharge.  Neck: Neck supple.  Cardiovascular: Normal rate, regular rhythm and normal heart sounds.  Exam reveals no gallop and no friction rub.   No murmur heard. Pulmonary/Chest: Effort normal and breath sounds normal. No respiratory distress.  Abdominal: Soft. She exhibits no distension. There is no tenderness.  Genitourinary:  Chaperone present. Erythema to inner thighs, around vagina and anus. moist macerated appearance towards perineum and anus.   Musculoskeletal: She exhibits no edema or tenderness.   Neurological: She is alert.  Skin: Skin is warm and dry.  Psychiatric: She has a normal mood and affect. Her behavior is normal. Thought content normal.  Nursing note and vitals reviewed.   ED Course  Procedures (including critical care time) Labs Review Labs Reviewed - No data to display  Imaging Review No results found. I have personally reviewed and evaluated these images and lab results as part of my medical decision-making.   EKG Interpretation None      MDM   Final diagnoses:  Candida rash of groin    70yF with candidal appearing rash to groin. Took dose of diflucan yesterday and has another dose to take next week. Will add topical. Keep dry. Loose fitting clothes. PCP FU as needed.     Virgel Manifold, MD  07/24/15 1518 

## 2015-07-20 ENCOUNTER — Telehealth: Payer: Self-pay | Admitting: General Practice

## 2015-07-20 NOTE — Telephone Encounter (Signed)
Patient called in wanting to follow-up on her MRI results.  The patient can be reached at [phone]

## 2015-07-20 NOTE — Telephone Encounter (Signed)
I explained to [facility](Michelle) that the MRI was ordered by the surgeon Ms Chestine SporeClark was seeing not us.

## 2015-07-20 NOTE — Telephone Encounter (Signed)
The reason it didn't come to me is because it was ordered by surgery. As per my OV note (and AVS d/c instructions) the MRI was scheduled by surgery to be done the week after my liast visit with her. I recommended she complete the MRI as ordered and follow-up with surgery for results (and any further recommendations by them). Let me know if any further questions.

## 2015-08-21 ENCOUNTER — Encounter: Payer: Self-pay | Admitting: *Deleted

## 2015-08-25 ENCOUNTER — Encounter: Payer: Self-pay | Admitting: Obstetrics & Gynecology

## 2015-08-25 ENCOUNTER — Ambulatory Visit (INDEPENDENT_AMBULATORY_CARE_PROVIDER_SITE_OTHER): Payer: Medicare HMO | Admitting: Obstetrics & Gynecology

## 2015-08-25 VITALS — BP 140/70 | HR 78 | Ht 64.0 in | Wt 207.0 lb

## 2015-08-25 DIAGNOSIS — N898 Other specified noninflammatory disorders of vagina: Secondary | ICD-10-CM | POA: Diagnosis not present

## 2015-08-25 DIAGNOSIS — N3941 Urge incontinence: Secondary | ICD-10-CM

## 2015-08-25 DIAGNOSIS — N9089 Other specified noninflammatory disorders of vulva and perineum: Secondary | ICD-10-CM

## 2015-08-25 MED ORDER — SOLIFENACIN SUCCINATE 10 MG PO TABS: ORAL_TABLET | ORAL | Status: DC

## 2015-08-25 NOTE — Progress Notes (Signed)
Patient ID: Connie Ponce, female   DOB: [birth year], 70 y.o.   MRN: [id number]      Chief Complaint  Patient presents with  . Referral    vaginal irritation    Blood pressure 140/70, pulse 78, height 5\' 4"  (1.626 m), weight 207 lb (93.9 kg).  70 y.o. G4P0 No LMP recorded. Patient has had a hysterectomy. The current method of family planning is status post hysterectomy.  Subjective Pt with frequent daily loss of urine unprovoked, has been going on for quite some time No pain no dysuria Causing vulvar irritation as well, having to wear wet pads  Objective Gen WDWN female NAD Vulva:  Atrophic irritated consistent with chronic moisture changes Vagina:  atrophic Cervix:  absent Uterus:  uterus absent Adnexa: ovaries:not present,     Pertinent ROS No burning with urination, frequency or urgency No nausea, vomiting or diarrhea Nor fever chills or other constitutional symptoms   Labs or studies     Impression Diagnoses this Encounter::   ICD-9-CM ICD-10-CM   1. Urge incontinence 788.31 N39.41   2. Vulvar irritation 624.8 N90.89     Established relevant diagnosis(es):   Plan/Recommendations: Meds ordered this encounter  Medications  . diltiazem (TIAZAC) 360 MG 24 hr capsule    Sig: Take 360 mg by mouth daily.  . solifenacin (VESICARE) 10 MG tablet    Sig: 1 tablet nightly    Dispense:  30 tablet    Refill:  11    Labs or Scans Ordered: No orders of the defined types were placed in this encounter.   Management:: Topical therapy with zinc oxide vesicare therapy for urge incontinence  Follow up Return in about 6 weeks (around 10/06/2015) for Follow up, with Dr Despina HiddenEure.       All questions were answered.

## 2015-09-05 ENCOUNTER — Ambulatory Visit (INDEPENDENT_AMBULATORY_CARE_PROVIDER_SITE_OTHER): Payer: Medicare HMO | Admitting: Nurse Practitioner

## 2015-09-05 ENCOUNTER — Encounter: Payer: Self-pay | Admitting: Nurse Practitioner

## 2015-09-05 VITALS — BP 141/68 | HR 78 | Temp 98.2°F | Ht 64.0 in | Wt 204.6 lb

## 2015-09-05 DIAGNOSIS — R112 Nausea with vomiting, unspecified: Secondary | ICD-10-CM | POA: Diagnosis not present

## 2015-09-05 DIAGNOSIS — K649 Unspecified hemorrhoids: Secondary | ICD-10-CM

## 2015-09-05 DIAGNOSIS — K6289 Other specified diseases of anus and rectum: Secondary | ICD-10-CM

## 2015-09-05 DIAGNOSIS — K5900 Constipation, unspecified: Secondary | ICD-10-CM | POA: Diagnosis not present

## 2015-09-05 NOTE — Assessment & Plan Note (Signed)
Mild, occasional nausea and vomiting. She significantly constipated this can be causing her nausea. Further treatment of constipation as noted above. I have asked her to call and let us know if her nausea persists and we can give her Zofran to help. Return for follow-up in 2 months.

## 2015-09-05 NOTE — Progress Notes (Signed)
cc'ed to pcp °

## 2015-09-05 NOTE — Assessment & Plan Note (Signed)
Patient has a history of hemorrhoids. Describes rectal pain with MRI showing linear tract suggestive of scar tissue from previous surgical intervention favored over active fistula. Has seen surgery to follow-up. Rectal cream and lidocaine without help, Preparation H with some improvement in symptoms. It is likely hemorrhoid symptoms in the setting of constipation are contributing to her symptoms. Recommend she continue Preparation H. Return for follow-up in 2 months.

## 2015-09-05 NOTE — Progress Notes (Signed)
Referring Provider: [name], NP Primary Care Physician:  [name], NP Primary GI:  Dr. Gala Romney  Chief Complaint  Patient presents with  . Rectal Pain  . Constipation    HPI:   Connie Ponce is a 70 y.o. female who presents for follow-up on rectal pain and constipation. She was last seen in our office 05/04/2015. At that time she noted she was still having rectal pain which is persistent/constant, worse with bowel movements. She was getting frustrated and the pain was hard to deal with. No bleeding noted, occasional lower abdominal pain, bowel movement daily sometimes soft sometimes constipated/hard. Not taking a stool softener that time. No other associated symptoms. There is concern for possible fistula on CT of the abdomen and pelvis and she had seen surgery at which point a question whether is a true fistula versus scar tissue from previous surgeries. They ordered an MRI which had not been done as of her last visit. He was recommended to use LMX 5 OTC 5% lidocaine cream 3-4 times daily as needed for rectal pain and return for follow-up in 4 months, keep MRI appointment. MRI was completed on 3/9/2017which found abnormalities favoring postoperative change/scarring from previous surgery (see full impression noted below). She was scheduled to follow up with surgeon.  MRI Pelvis w/ w/o contrast IMPRESSION: Linear tract originating from in the left proximal to mid anal canal at the 3 o'clock position, with trans sphincteric extension inferiorly into the gluteal crease. This shows no internal fluid or contrast enhancement, and this favors chronic postoperative change/scarring from previous rectal surgery, rather than an active fistula or abscess.  Today she states she's doing about the same. She has significant pain with each bowel movement. Still constipated, bowel won't move if she doesn't take something. Will typically not have a bowel movement for about 3 days, will take  a dose of citracal and have a bowel movement that "isn't very much." Does take daily stool softener. Also having nausea and vomiting 2-3 times a week. Also with lower abdominal pain, tends to resolve after a bowel movement. BMs are painful "feels like something is tearing me all up." States she is using LMX OTC lidocaine cream, but is not helping. Preparation H does help. Denies hematochezia, melena. Denies chest pain, dyspnea, dizziness, lightheadedness, syncope, near syncope. Denies any other upper or lower GI symptoms.   Past Medical History  Diagnosis Date  . Hypertension   . Hyperlipidemia   . Hypothyroidism   . Anxiety   . Osteoarthritis   . COPD (chronic obstructive pulmonary disease) ([place])   . GERD (gastroesophageal reflux disease)   . Renal insufficiency   . Stroke ([place]) 1990s    mild  . Pneumonia 2010  . Spinal stenosis 2010    lumbar  . Gastric ulcer   . Hyperglycemia   . Peptic ulcer disease   . Carpal tunnel syndrome     Past Surgical History  Procedure Laterality Date  . Cholecystectomy    . Appendectomy    . Abdominal hysterectomy    . Cystectomy      cyst removed from rectal area.   . Esophagogastroduodenoscopy  01/05/2003    RMR: Normal esophagus, small hiatal hernia/ A couple of tiny antral erosions, otherwise normal stomach normal D1 and D2.   . Colonoscopy      ?2005, [place]  . Colonoscopy with esophagogastroduodenoscopy (egd)  02/11/2012    RMR: Ulcerative/erosive reflux esophagitis, benign appearing gastric ulcer with unremarkable  biopsy, no H pylori. Colonoscopy was unremarkable. Next colonoscopy in 2023  . Back surgery    . Bone marrow biopsy Left 04/22/14  . Bone marrow aspiration Left 04/22/14  . Esophagogastroduodenoscopy N/A 08/11/2014    UEK:[id number] ulcer/HH  . Esophagogastroduodenoscopy N/A 12/01/2014    [id number]:[id number] improved gastric ulcerations/p bx  . Joint replacement Right     knee  . Colonoscopy N/A 04/07/2015    VVZ:[id number]    . Hemorrhoid surgery      Current Outpatient Prescriptions  Medication Sig Dispense Refill  . aspirin EC 81 MG tablet Take 81 mg by mouth daily.    . budesonide-formoterol (SYMBICORT) 160-4.5 MCG/ACT inhaler Inhale 2 puffs into the lungs 2 (two) times daily as needed (shortness of breath).     . Cholecalciferol (VITAMIN D-3) 5000 UNITS TABS Take 1 tablet by mouth daily.    . clobetasol cream (TEMOVATE) 8.67 % Apply 1 application topically 2 (two) times daily.    Marland Kitchen DEXILANT 60 MG capsule TAKE (1) CAPSULE BY MOUTH ONCE DAILY. 28 capsule 5  . diltiazem (TIAZAC) 360 MG 24 hr capsule Take 360 mg by mouth daily.    . hydrocortisone cream 1 % Apply 1 application topically 2 (two) times daily.    Marland Kitchen levothyroxine (SYNTHROID, LEVOTHROID) 137 MCG tablet Take 137 mcg by mouth daily before breakfast.    . lisinopril (PRINIVIL,ZESTRIL) 40 MG tablet Take 40 mg by mouth daily.    . meclizine (ANTIVERT) 25 MG tablet Take 25 mg by mouth as needed for dizziness.    . metoprolol tartrate (LOPRESSOR) 25 MG tablet Take 25 mg by mouth 2 (two) times daily.    Marland Kitchen oxyCODONE-acetaminophen (PERCOCET/ROXICET) 5-325 MG tablet 1 tablet every 6 (six) hours as needed for moderate pain or severe pain.     . potassium chloride (K-DUR) 10 MEQ tablet Take 10 mEq by mouth daily.     . pravastatin (PRAVACHOL) 40 MG tablet Take 40 mg by mouth daily.    . ranitidine (ZANTAC) 150 MG tablet Take 300 mg by mouth at bedtime. Takes 2 in the evening.    . solifenacin (VESICARE) 10 MG tablet 1 tablet nightly 30 tablet 11  . traZODone (DESYREL) 150 MG tablet Take 300 mg by mouth at bedtime.    . triamterene-hydrochlorothiazide (MAXZIDE-25) 37.5-25 MG per tablet Take 1 tablet by mouth daily.     No current facility-administered medications for this visit.    Allergies as of 09/05/2015 - Review Complete 09/05/2015  Allergen Reaction Noted  . Tetracyclines & related  08/21/2015  . Shellfish-derived products Nausea And Vomiting  07/15/2015    Family History  Problem Relation Age of Onset  . Colon cancer Neg Hx   . Liver disease Neg Hx   . Other Mother     died age 59, natural causes  . Stroke Mother   . Diabetes Daughter   . Healthy Son   . Healthy Daughter   . Healthy Daughter     Social History   Social History  . Marital Status: Married    Spouse Name: N/A  . Number of Children: 3  . Years of Education: N/A   Social History Main Topics  . Smoking status: Former Smoker -- 0.25 packs/day    Types: Cigarettes    Quit date: 08/22/2014  . Smokeless tobacco: Never Used  . Alcohol Use: No  . Drug Use: No  . Sexual Activity: No   Other Topics Concern  . None   Social History  Narrative    Review of Systems: General: Negative for anorexia, weight loss, fever, chills, fatigue, weakness. ENT: Negative for hoarseness, difficulty swallowing. CV: Negative for chest pain, angina, palpitations, peripheral edema.  Respiratory: Negative for dyspnea at rest, cough, sputum, wheezing.  GI: See history of present illness. Endo: Negative for unusual weight change.  Heme: Negative for bruising or bleeding.   Physical Exam: BP 141/68 mmHg  Pulse 78  Temp(Src) 98.2 F (36.8 C)  Ht 5' 4"  (1.626 m)  Wt 204 lb 9.6 oz (92.806 kg)  BMI 35.10 kg/m2 General:   Alert and oriented. Pleasant and cooperative. Well-nourished and well-developed.  Eyes:  Without icterus, sclera clear and conjunctiva pink.  Ears:  Normal auditory acuity. Cardiovascular:  S1, S2 present without murmurs appreciated. Extremities without clubbing or edema. Respiratory:  Clear to auscultation bilaterally. No wheezes, rales, or rhonchi. No distress.  Gastrointestinal:  +BS, rounded but soft, and non-distended. Mild RLQ TTP noted. No HSM noted. No guarding or rebound. No masses appreciated.  Rectal:  Deferred  Musculoskalatal:  Symmetrical without gross deformities. Neurologic:  Alert and oriented x4;  grossly normal  neurologically. Psych:  Alert and cooperative. Normal mood and affect. Heme/Lymph/Immune: No excessive bruising noted.    09/05/2015 11:54 AM   Disclaimer: This note was dictated with voice recognition software. Similar sounding words can inadvertently be transcribed and may not be corrected upon review.

## 2015-09-05 NOTE — Patient Instructions (Signed)
1. Continue taking Preparation H as needed for your rectal pain symptoms. 2. We will start you on Linzess 72 g once daily. Take it once a day, on an empty stomach. 3. We will [facility] with samples to last 2 weeks. 4. In 2 weeks, call us with results on whether the medication is working or not and if it is working well enough or not. 5. Return for follow-up in 2 months.

## 2015-09-05 NOTE — Assessment & Plan Note (Signed)
Noted rectal pain not improved with over-the-counter LMX 5% lidocaine cream. Preparation H does help. She does have a history of hemorrhoids as noted above as well as significant persisting constipation as noted above. 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 This is all likely contributing and overlying for her overall abdominal and rectal symptoms. I feel she will likely have some significant improvement if we can get her constipation under control. Linzess as noted above, continue use of Preparation H as needed, call in 2 weeks with progress report. Return for follow-up in 2 months.

## 2015-09-05 NOTE — Assessment & Plan Note (Addendum)
Patient with continued significant constipation. Does have a bowel movement every 2-3 days, only when she takes something over-the-counter to help her have a bowel movement. She also takes Colace stool softener daily without much effect. 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 At this point I'll trial her on Linzess 72 g once a day to see if we can improve her bowel movements and therefore overall symptoms. We will provide samples for 2 weeks, have her call us in 2 weeks with a progress report at which point we can make adjustments as needed or provide a longer-term Rx.. Return for follow-up in 2 months.

## 2015-09-18 ENCOUNTER — Telehealth: Payer: Self-pay | Admitting: Internal Medicine

## 2015-09-18 MED ORDER — LINACLOTIDE 72 MCG PO CAPS: 72.0000 ug | ORAL_CAPSULE | Freq: Every day | ORAL | Status: DC

## 2015-09-18 NOTE — Telephone Encounter (Signed)
Forwarding to the refill box.  

## 2015-09-18 NOTE — Telephone Encounter (Signed)
PATIENT CAME IN OFFICE AND STATED THAT THE LINZESS 72 MCG SAMPLES ARE WORKING WELL AND NEEDS A PRESCRIPTION CALLED INTO [facility] [place]  [phone] PATIENT)

## 2015-09-18 NOTE — Telephone Encounter (Signed)
Completed.

## 2015-09-18 NOTE — Addendum Note (Signed)
Addended by: Nira RetortSAMS, Atiya Yera W on: 09/18/2015 02:57 PM   Modules accepted: Orders

## 2015-10-06 ENCOUNTER — Ambulatory Visit (INDEPENDENT_AMBULATORY_CARE_PROVIDER_SITE_OTHER): Payer: Medicare HMO | Admitting: Obstetrics & Gynecology

## 2015-10-06 ENCOUNTER — Encounter: Payer: Self-pay | Admitting: Obstetrics & Gynecology

## 2015-10-06 VITALS — BP 118/60 | HR 72 | Wt 208.0 lb

## 2015-10-06 DIAGNOSIS — N3941 Urge incontinence: Secondary | ICD-10-CM

## 2015-10-06 DIAGNOSIS — Z90710 Acquired absence of both cervix and uterus: Secondary | ICD-10-CM

## 2015-10-06 DIAGNOSIS — N9089 Other specified noninflammatory disorders of vulva and perineum: Secondary | ICD-10-CM

## 2015-10-06 MED ORDER — FESOTERODINE FUMARATE ER 8 MG PO TB24: 8.0000 mg | ORAL_TABLET | Freq: Every day | ORAL | Status: DC

## 2015-10-06 MED ORDER — GERHARDT'S BUTT CREAM: 1.0000 "application " | TOPICAL_CREAM | Freq: Three times a day (TID) | CUTANEOUS | Status: DC

## 2015-10-06 NOTE — Progress Notes (Signed)
Patient ID: Connie Ponce, female   DOB: January 14, 1946, 70 y.o.   MRN: [id number]      Chief Complaint  Patient presents with  . Follow-up    Blood pressure 118/60, pulse 72, weight 208 lb (94.3 kg).  70 y.o. G4P0 No LMP recorded. Patient has had a hysterectomy. The current method of family planning is status post hysterectomy.  Subjective Pt here for evaluation of vuvlar irritation/burning and inability to hold her urine at all Occurs unpredictably, not associated with cough sneeze, wakes up wet, just happens all the time  Objective Erythema moisture changes of the vulva No cystocoele  Pertinent ROS No burning with urination, frequency or urgency No nausea, vomiting or diarrhea Nor fever chills or other constitutional symptoms   Labs or studies     Impression Diagnoses this Encounter::   ICD-9-CM ICD-10-CM   1. Urge incontinence 788.31 N39.41   2. Vulvar irritation 624.8 N90.89     Established relevant diagnosis(es):   Plan/Recommendations: Meds ordered this encounter  Medications  . Hydrocortisone (GERHARDT'S BUTT CREAM) CREA    Sig: Apply 1 application topically 3 (three) times daily.    Dispense:  1 each    Refill:  11  . fesoterodine (TOVIAZ) 8 MG TB24 tablet    Sig: Take 1 tablet (8 mg total) by mouth daily.    Dispense:  30 tablet    Refill:  11    Labs or Scans Ordered: No orders of the defined types were placed in this encounter.   Management:: Local care with Gerhardt's butt cream toviaz  Follow up Return in about 6 weeks (around 11/17/2015) for Follow up, with Dr Despina HiddenEure.        All questions were answered.

## 2015-10-18 ENCOUNTER — Telehealth: Payer: Self-pay | Admitting: Internal Medicine

## 2015-10-18 NOTE — Telephone Encounter (Signed)
[employer] called this afternoon to say that Dr Despina Hidden had taken the patient off of Linzess and she has continued taking Amitiza. Pt wants to continue Amitiza. The pharmacy is going to discontinue the Linzess on their end. A pharmacy from there had called earlier today and spoke with the nurse.

## 2015-10-19 NOTE — Telephone Encounter (Signed)
Noted  

## 2015-10-19 NOTE — Telephone Encounter (Signed)
Noted. I have not spoken with the pharmacy. Routing to EG for [employer]

## 2015-10-23 ENCOUNTER — Encounter: Payer: Self-pay | Admitting: Nurse Practitioner

## 2015-10-23 ENCOUNTER — Ambulatory Visit (INDEPENDENT_AMBULATORY_CARE_PROVIDER_SITE_OTHER): Payer: Medicare HMO | Admitting: Nurse Practitioner

## 2015-10-23 VITALS — BP 140/73 | HR 69 | Temp 98.1°F | Ht 64.0 in | Wt 206.8 lb

## 2015-10-23 DIAGNOSIS — K5900 Constipation, unspecified: Secondary | ICD-10-CM | POA: Diagnosis not present

## 2015-10-23 NOTE — Progress Notes (Signed)
Referring Provider: [name], NP Primary Care Physician:  [name], NP Primary GI:  Dr. Gala Romney  Chief Complaint  Patient presents with  . Follow-up    same problems as before    HPI:   Connie Ponce is a 70 y.o. female who presents for two-month follow-up on constipation, hemorrhoids, rectal pain, nausea vomiting. Last seen in our office 09/05/2015 at which point she noted rectal pain not improving with over-the-counter element 5% lidocaine cream, Preparation H does help. 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 Requested she call with two-week progress report on Linzess 72 g daily, return for follow-up in 2 months. She was also given Zofran by mouth to help with nausea. He called to follow-up stating that Linzess is working very well and request a prescription which was sent to her pharmacy. Follow-up phone call last week notes another provider stopped Linzess and started Amitiza which was also working well and the pharmacy subsequent he discontinued Linzess and continued Amitiza.  Today she states she's doing well overall. Linzess "didn't agree with me" noting decrease in energy which resolved after stopping it. Has been on Amitiza for a while, thinks it's working well. Is having a bowel movement every 3-4 days. States she has a bowel movement every 3-4 days, has to take an OTC laxative to go to the bathroom. Sounds like unproductive bowel movement. Denies hematochezia, melena, chills, unintentional weight loss. Admits subjective fever but doesn't take her temperature. Denies chest pain, dizziness, lightheadedness, syncope, near syncope. Denies any other upper or lower GI symptoms.  Past Medical History:  Diagnosis Date  . Anxiety   . Carpal tunnel syndrome   . COPD (chronic obstructive pulmonary disease) ([place])   . Gastric ulcer   . GERD (gastroesophageal reflux disease)   .  Hyperglycemia   . Hyperlipidemia   . Hypertension   . Hypothyroidism   . Osteoarthritis   . Peptic ulcer disease   . Pneumonia 2010  . Renal insufficiency   . 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 Stroke [facility]) 1990s   mild    Past Surgical History:  Procedure Laterality Date  . ABDOMINAL HYSTERECTOMY    . APPENDECTOMY    . BACK SURGERY    . BONE MARROW ASPIRATION Left 04/22/14  . BONE MARROW BIOPSY Left 04/22/14  . CHOLECYSTECTOMY    . COLONOSCOPY     ?2005, [place]  . COLONOSCOPY N/A 04/07/2015   EOF:[id number]  . COLONOSCOPY WITH ESOPHAGOGASTRODUODENOSCOPY (EGD)  02/11/2012   RMR: Ulcerative/erosive reflux esophagitis, benign appearing gastric ulcer with unremarkable biopsy, no H pylori. Colonoscopy was unremarkable. Next colonoscopy in 2023  . CYSTECTOMY     cyst removed from rectal area.   . ESOPHAGOGASTRODUODENOSCOPY  01/05/2003   RMR: Normal esophagus, small hiatal hernia/ A couple of tiny antral erosions, otherwise normal stomach normal D1 and D2.   . ESOPHAGOGASTRODUODENOSCOPY N/A 08/11/2014   OIT:GPQDIYM ulcer/HH  . ESOPHAGOGASTRODUODENOSCOPY N/A 12/01/2014   EBR:[id number] improved gastric ulcerations/p bx  . HEMORRHOID SURGERY    . JOINT REPLACEMENT Right    knee    Current Outpatient Prescriptions  Medication Sig Dispense Refill  . aspirin EC 81 MG tablet Take 81 mg by mouth daily.    . budesonide-formoterol (SYMBICORT) 160-4.5 MCG/ACT inhaler Inhale 2 puffs into the lungs 2 (two) times daily as needed (shortness of breath).     . Cholecalciferol (VITAMIN D-3) 5000 UNITS  TABS Take 1 tablet by mouth daily.    Marland Kitchen DEXILANT 60 MG capsule TAKE (1) CAPSULE BY MOUTH ONCE DAILY. 28 capsule 5  . diltiazem (TIAZAC) 360 MG 24 hr capsule Take 360 mg by mouth daily.    . fesoterodine (TOVIAZ) 8 MG TB24 tablet Take 1 tablet (8 mg total) by mouth daily. 30 tablet 11  . Hydrocortisone (GERHARDT'S BUTT CREAM) CREA Apply 1 application topically 3 (three) times daily. 1 each 11   . levothyroxine (SYNTHROID, LEVOTHROID) 137 MCG tablet Take 137 mcg by mouth daily before breakfast.    . lisinopril (PRINIVIL,ZESTRIL) 40 MG tablet Take 40 mg by mouth daily.    Marland Kitchen lubiprostone (AMITIZA) 24 MCG capsule Take 24 mcg by mouth 2 (two) times daily with a meal.    . meclizine (ANTIVERT) 25 MG tablet Take 25 mg by mouth as needed for dizziness. Reported on 10/06/2015    . metoprolol tartrate (LOPRESSOR) 25 MG tablet Take 25 mg by mouth 2 (two) times daily.    Marland Kitchen oxyCODONE-acetaminophen (PERCOCET/ROXICET) 5-325 MG tablet 1 tablet every 6 (six) hours as needed for moderate pain or severe pain.     . potassium chloride (K-DUR) 10 MEQ tablet Take 10 mEq by mouth daily.     . pravastatin (PRAVACHOL) 40 MG tablet Take 40 mg by mouth daily.    . ranitidine (ZANTAC) 150 MG tablet Take 300 mg by mouth at bedtime. Takes 2 in the evening.    . solifenacin (VESICARE) 10 MG tablet 1 tablet nightly 30 tablet 11  . traZODone (DESYREL) 150 MG tablet Take 300 mg by mouth at bedtime.    . triamterene-hydrochlorothiazide (MAXZIDE-25) 37.5-25 MG per tablet Take 1 tablet by mouth daily.     No current facility-administered medications for this visit.     Allergies as of 10/23/2015 - Review Complete 10/23/2015  Allergen Reaction Noted  . Tetracyclines & related  08/21/2015  . Shellfish-derived products Nausea And Vomiting 07/15/2015    Family History  Problem Relation Age of Onset  . Other Mother     died age 25, natural causes  . Stroke Mother   . Diabetes Daughter   . Healthy Son   . Healthy Daughter   . Healthy Daughter   . Colon cancer Neg Hx   . Liver disease Neg Hx     Social History   Social History  . Marital status: Married    Spouse name: N/A  . Number of children: 3  . Years of education: N/A   Social History Main Topics  . Smoking status: Current Every Day Smoker    Packs/day: 0.10    Types: Cigarettes    Last attempt to quit: 08/22/2014  . Smokeless tobacco: Never Used       Comment: Is trying to quit, actively cutting back  . Alcohol use No  . Drug use: No  . Sexual activity: No   Other Topics Concern  . Not on file   Social History Narrative  . No narrative on file    Review of Systems: General: Negative for anorexia, weight loss, fever, chills, fatigue, weakness. ENT: Negative for hoarseness, difficulty swallowing. CV: Negative for chest pain, angina, palpitations, peripheral edema.  Respiratory: Negative for dyspnea at rest, cough, sputum, wheezing.  GI: See history of present illness. Endo: Negative for unusual weight change.  Heme: Negative for bruising or bleeding.   Physical Exam: BP 140/73   Pulse 69   Temp 98.1 F (36.7 C) (Oral)  Ht _0  (1.626 m)   Wt 206 lb 12.8 oz (93.8 kg)   BMI 35.50 kg/m  General:   Alert and oriented. Pleasant and cooperative. Well-nourished and well-developed.  Head:  Normocephalic and atraumatic. Eyes:  Without icterus, sclera clear and conjunctiva pink.  Ears:  Normal auditory acuity. Cardiovascular:  S1, S2 present without murmurs appreciated. Extremities without clubbing or edema. Respiratory:  Clear to auscultation bilaterally. No wheezes, rales, or rhonchi. No distress.  Gastrointestinal:  +BS, soft, non-tender and non-distended. No HSM noted. No guarding or rebound. No masses appreciated.  Rectal:  Deferred  Musculoskalatal:  Symmetrical without gross deformities. Neurologic:  Alert and oriented x4;  grossly normal neurologically. Psych:  Alert and cooperative. Normal mood and affect. Heme/Lymph/Immune: No excessive bruising noted.    10/23/2015 11:47 AM   Disclaimer: This note was dictated with voice recognition software. Similar sounding words can inadvertently be transcribed and may not be corrected upon review.

## 2015-10-23 NOTE — Patient Instructions (Signed)
1. Start taking Trulance 3 mg. Take 1 pill once a day on an empty stomach or with food, which every 4. 2. Hold Amitiza while you're taking Trulance. 3. Call with results in 1-2 weeks and let us know how that Trulance is working. 4. Return for follow-up in 3 months.

## 2015-10-23 NOTE — Assessment & Plan Note (Signed)
Continued constipation. Linzess caused adverse effects that she did not want to tolerate. She is back on Amitiza 24 g which is not effective for her. This point we'll have her hold Amitiza, started Trulance 3 mg once a day with or without food. Unsure if her consultation is primarily opioid-induced versus chronic hepatic constipation. 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 She is to call with results in 1-2 weeks, return for follow-up in 3 months.

## 2015-10-23 NOTE — Progress Notes (Signed)
cc'ed to pcp °

## 2015-11-07 ENCOUNTER — Other Ambulatory Visit: Payer: Self-pay | Admitting: Gastroenterology

## 2015-11-17 ENCOUNTER — Ambulatory Visit: Payer: Medicare HMO | Admitting: Obstetrics & Gynecology

## 2015-11-30 ENCOUNTER — Ambulatory Visit (INDEPENDENT_AMBULATORY_CARE_PROVIDER_SITE_OTHER): Payer: Medicare HMO | Admitting: Obstetrics & Gynecology

## 2015-11-30 ENCOUNTER — Encounter: Payer: Self-pay | Admitting: Obstetrics & Gynecology

## 2015-11-30 VITALS — BP 138/62 | HR 78 | Wt 207.0 lb

## 2015-11-30 DIAGNOSIS — N9089 Other specified noninflammatory disorders of vulva and perineum: Secondary | ICD-10-CM | POA: Diagnosis not present

## 2015-11-30 DIAGNOSIS — Z90710 Acquired absence of both cervix and uterus: Secondary | ICD-10-CM

## 2015-11-30 DIAGNOSIS — N3941 Urge incontinence: Secondary | ICD-10-CM | POA: Diagnosis not present

## 2015-11-30 MED ORDER — OXYBUTYNIN CHLORIDE 5 MG PO TABS: 5.0000 mg | ORAL_TABLET | Freq: Three times a day (TID) | ORAL | 3 refills | Status: DC

## 2015-11-30 NOTE — Progress Notes (Signed)
Chief Complaint  Patient presents with  . Follow-up    vulva irration    Blood pressure 138/62, pulse 78, weight 207 lb (93.9 kg).  70 y.o. G4P0 No LMP recorded. Patient has had a hysterectomy. The current method of family planning is status post hysterectomy.  Outpatient Encounter Prescriptions as of 11/30/2015  Medication Sig Note  . aspirin EC 81 MG tablet Take 81 mg by mouth daily.   . budesonide-formoterol (SYMBICORT) 160-4.5 MCG/ACT inhaler Inhale 2 puffs into the lungs 2 (two) times daily as needed (shortness of breath).    . Cholecalciferol (VITAMIN D-3) 5000 UNITS TABS Take 1 tablet by mouth daily.   Marland Kitchen DEXILANT 60 MG capsule TAKE (1) CAPSULE BY MOUTH ONCE DAILY.   Marland Kitchen diltiazem (TIAZAC) 360 MG 24 hr capsule Take 360 mg by mouth daily.   . fesoterodine (TOVIAZ) 8 MG TB24 tablet Take 1 tablet (8 mg total) by mouth daily.   . Hydrocortisone (GERHARDT'S BUTT CREAM) CREA Apply 1 application topically 3 (three) times daily.   Marland Kitchen levothyroxine (SYNTHROID, LEVOTHROID) 137 MCG tablet Take 137 mcg by mouth daily before breakfast.   . lisinopril (PRINIVIL,ZESTRIL) 40 MG tablet Take 40 mg by mouth daily.   Marland Kitchen lubiprostone (AMITIZA) 24 MCG capsule Take 24 mcg by mouth 2 (two) times daily with a meal.   . metoprolol tartrate (LOPRESSOR) 25 MG tablet Take 25 mg by mouth 2 (two) times daily.   . potassium chloride (K-DUR) 10 MEQ tablet Take 10 mEq by mouth daily.    . pravastatin (PRAVACHOL) 40 MG tablet Take 40 mg by mouth daily.   . ranitidine (ZANTAC) 150 MG tablet Take 300 mg by mouth at bedtime. Takes 2 in the evening. 01/03/2015: Received from: External Pharmacy Received Sig:   . traZODone (DESYREL) 150 MG tablet Take 300 mg by mouth at bedtime.   . triamterene-hydrochlorothiazide (MAXZIDE-25) 37.5-25 MG per tablet Take 1 tablet by mouth daily.   . [DISCONTINUED] solifenacin (VESICARE) 10 MG tablet 1 tablet nightly   . meclizine (ANTIVERT) 25 MG tablet Take 25 mg by mouth as needed  for dizziness. Reported on 10/06/2015   . oxybutynin (DITROPAN) 5 MG tablet Take 1 tablet (5 mg total) by mouth 3 (three) times daily.   Marland Kitchen oxyCODONE-acetaminophen (PERCOCET/ROXICET) 5-325 MG tablet 1 tablet every 6 (six) hours as needed for moderate pain or severe pain.  07/04/2015: Received from: External Pharmacy   No facility-administered encounter medications on file as of 11/30/2015.     Subjective Pt seen in follow up from July  At that time presented with vulvar irritation that I thought were secondary to her significant urge incontinence with subsequent moisture changes as a result I placed her on Lisbeth Ply which was not covered, changed to vesicare which was but it is not helping at all The vuvlar irritation is much better on the Gerhardt's Butt Cream  Objective Actively losing urine vulva health is better much less irritated  Pertinent ROS No burning with urination, frequency or urgency No nausea, vomiting or diarrhea Nor fever chills or other constitutional symptoms   Labs or studies     Impression Diagnoses this Encounter::   ICD-9-CM ICD-10-CM   1. Urge incontinence 788.31 N39.41   2. Vulvar irritation 624.8 N90.89     Established relevant diagnosis(es):   Plan/Recommendations: Meds ordered this encounter  Medications  . oxybutynin (DITROPAN) 5 MG tablet    Sig: Take 1 tablet (5 mg total) by mouth 3 (three) times  daily.    Dispense:  90 tablet    Refill:  3    Labs or Scans Ordered: No orders of the defined types were placed in this encounter.   Management:: Will change to ditropan 5 mg TID and see if that helps Continue the topical cream  Follow up Return in about 3 months (around 02/29/2016) for Follow up, with Dr Elonda Husky.     All questions were answered.  Past Medical History:  Diagnosis Date  . Anxiety   . Carpal tunnel syndrome   . COPD (chronic obstructive pulmonary disease) ([place])   . Gastric ulcer   . GERD (gastroesophageal reflux  disease)   . Hyperglycemia   . Hyperlipidemia   . Hypertension   . Hypothyroidism   . Osteoarthritis   . Peptic ulcer disease   . Pneumonia 2010  . Renal insufficiency   . 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 Stroke [facility]) 1990s   mild    Past Surgical History:  Procedure Laterality Date  . ABDOMINAL HYSTERECTOMY    . APPENDECTOMY    . BACK SURGERY    . BONE MARROW ASPIRATION Left 04/22/14  . BONE MARROW BIOPSY Left 04/22/14  . CHOLECYSTECTOMY    . COLONOSCOPY     ?2005, [place]  . COLONOSCOPY N/A 04/07/2015   SPZ:[id number]  . COLONOSCOPY WITH ESOPHAGOGASTRODUODENOSCOPY (EGD)  02/11/2012   RMR: Ulcerative/erosive reflux esophagitis, benign appearing gastric ulcer with unremarkable biopsy, no H pylori. Colonoscopy was unremarkable. Next colonoscopy in 2023  . CYSTECTOMY     cyst removed from rectal area.   . ESOPHAGOGASTRODUODENOSCOPY  01/05/2003   RMR: Normal esophagus, small hiatal hernia/ A couple of tiny antral erosions, otherwise normal stomach normal D1 and D2.   . ESOPHAGOGASTRODUODENOSCOPY N/A 08/11/2014   TVG:VSYVGCY ulcer/HH  . ESOPHAGOGASTRODUODENOSCOPY N/A 12/01/2014   [id number] improved gastric ulcerations/p bx  . HEMORRHOID SURGERY    . JOINT REPLACEMENT Right    knee    OB History    Gravida Para Term Preterm AB Living   4         3   SAB TAB Ectopic Multiple Live Births                  Allergies  Allergen Reactions  . Tetracyclines & Related   . Shellfish-Derived Products Nausea And Vomiting    Social History   Social History  . Marital status: Married    Spouse name: N/A  . Number of children: 3  . Years of education: N/A   Social History Main Topics  . Smoking status: Current Every Day Smoker    Packs/day: 0.10    Types: Cigarettes    Last attempt to quit: 08/22/2014  . Smokeless tobacco: Never Used     Comment: Is trying to quit, actively cutting back  . Alcohol use No  . Drug use: No  . Sexual activity: No   Other Topics  Concern  . None   Social History Narrative  . None    Family History  Problem Relation Age of Onset  . Other Mother     died age 1, natural causes  . Stroke Mother   . Diabetes Daughter   . Healthy Son   . Healthy Daughter   . Healthy Daughter   . Colon cancer Neg Hx   . Liver disease Neg Hx

## 2016-01-01 ENCOUNTER — Other Ambulatory Visit (HOSPITAL_COMMUNITY): Payer: Self-pay | Admitting: *Deleted

## 2016-01-01 DIAGNOSIS — D696 Thrombocytopenia, unspecified: Secondary | ICD-10-CM

## 2016-01-03 ENCOUNTER — Observation Stay (HOSPITAL_COMMUNITY)
Admission: EM | Admit: 2016-01-03 | Discharge: 2016-01-04 | Disposition: A | Discharge status: Home / Self Care | Payer: Medicare HMO | Attending: Internal Medicine | Admitting: Internal Medicine

## 2016-01-03 ENCOUNTER — Emergency Department (HOSPITAL_COMMUNITY): Payer: Medicare HMO

## 2016-01-03 ENCOUNTER — Encounter (HOSPITAL_COMMUNITY): Payer: Self-pay

## 2016-01-03 ENCOUNTER — Encounter (HOSPITAL_COMMUNITY): Payer: Medicare HMO | Attending: Hematology & Oncology

## 2016-01-03 ENCOUNTER — Other Ambulatory Visit: Payer: Self-pay

## 2016-01-03 ENCOUNTER — Observation Stay (HOSPITAL_COMMUNITY): Payer: Medicare HMO

## 2016-01-03 DIAGNOSIS — D696 Thrombocytopenia, unspecified: Secondary | ICD-10-CM | POA: Insufficient documentation

## 2016-01-03 DIAGNOSIS — N390 Urinary tract infection, site not specified: Secondary | ICD-10-CM | POA: Diagnosis not present

## 2016-01-03 DIAGNOSIS — R1312 Dysphagia, oropharyngeal phase: Secondary | ICD-10-CM | POA: Diagnosis not present

## 2016-01-03 DIAGNOSIS — G459 Transient cerebral ischemic attack, unspecified: Principal | ICD-10-CM | POA: Insufficient documentation

## 2016-01-03 DIAGNOSIS — G453 Amaurosis fugax: Secondary | ICD-10-CM | POA: Diagnosis not present

## 2016-01-03 DIAGNOSIS — M6281 Muscle weakness (generalized): Secondary | ICD-10-CM

## 2016-01-03 DIAGNOSIS — N179 Acute kidney failure, unspecified: Secondary | ICD-10-CM | POA: Diagnosis present

## 2016-01-03 DIAGNOSIS — R200 Anesthesia of skin: Secondary | ICD-10-CM | POA: Diagnosis present

## 2016-01-03 DIAGNOSIS — D649 Anemia, unspecified: Secondary | ICD-10-CM | POA: Diagnosis present

## 2016-01-03 DIAGNOSIS — J449 Chronic obstructive pulmonary disease, unspecified: Secondary | ICD-10-CM | POA: Diagnosis not present

## 2016-01-03 DIAGNOSIS — R1310 Dysphagia, unspecified: Secondary | ICD-10-CM

## 2016-01-03 DIAGNOSIS — I10 Essential (primary) hypertension: Secondary | ICD-10-CM | POA: Diagnosis not present

## 2016-01-03 DIAGNOSIS — Z79899 Other long term (current) drug therapy: Secondary | ICD-10-CM | POA: Insufficient documentation

## 2016-01-03 DIAGNOSIS — Z87891 Personal history of nicotine dependence: Secondary | ICD-10-CM | POA: Insufficient documentation

## 2016-01-03 DIAGNOSIS — E039 Hypothyroidism, unspecified: Secondary | ICD-10-CM | POA: Insufficient documentation

## 2016-01-03 DIAGNOSIS — R29898 Other symptoms and signs involving the musculoskeletal system: Secondary | ICD-10-CM

## 2016-01-03 DIAGNOSIS — K219 Gastro-esophageal reflux disease without esophagitis: Secondary | ICD-10-CM

## 2016-01-03 HISTORY — DX: Transient cerebral ischemic attack, unspecified: G45.9

## 2016-01-03 LAB — COMPREHENSIVE METABOLIC PANEL
ALK PHOS: 91 U/L (ref 38–126)
ALT: 11 U/L — AB (ref 14–54)
AST: 17 U/L (ref 15–41)
Albumin: 3.4 g/dL — ABNORMAL LOW (ref 3.5–5.0)
Anion gap: 8 (ref 5–15)
BUN: 24 mg/dL — AB (ref 6–20)
CALCIUM: 9.2 mg/dL (ref 8.9–10.3)
CHLORIDE: 102 mmol/L (ref 101–111)
CO2: 25 mmol/L (ref 22–32)
CREATININE: 1.41 mg/dL — AB (ref 0.44–1.00)
GFR calc non Af Amer: 36 mL/min — ABNORMAL LOW (ref >60)
GFR, EST AFRICAN AMERICAN: 42 mL/min — AB (ref >60)
Glucose, Bld: 101 mg/dL — ABNORMAL HIGH (ref 65–99)
Potassium: 3.4 mmol/L — ABNORMAL LOW (ref 3.5–5.1)
SODIUM: 135 mmol/L (ref 135–145)
Total Bilirubin: 0.3 mg/dL (ref 0.3–1.2)
Total Protein: 6.6 g/dL (ref 6.5–8.1)

## 2016-01-03 LAB — CBC
HCT: 37.3 % (ref 36.0–46.0)
HEMOGLOBIN: 12.6 g/dL (ref 12.0–15.0)
MCH: 30.8 pg (ref 26.0–34.0)
MCHC: 33.8 g/dL (ref 30.0–36.0)
MCV: 91.2 fL (ref 78.0–100.0)
PLATELETS: 166 10*3/uL (ref 150–400)
RBC: 4.09 MIL/uL (ref 3.87–5.11)
RDW: 12.2 % (ref 11.5–15.5)
WBC: 4.8 10*3/uL (ref 4.0–10.5)

## 2016-01-03 LAB — RETICULOCYTES
RBC.: 4.09 MIL/uL (ref 3.87–5.11)
RETIC COUNT ABSOLUTE: 65.4 10*3/uL (ref 19.0–186.0)
Retic Ct Pct: 1.6 % (ref 0.4–3.1)

## 2016-01-03 LAB — URINALYSIS, ROUTINE W REFLEX MICROSCOPIC
BILIRUBIN URINE: NEGATIVE
GLUCOSE, UA: NEGATIVE mg/dL
HGB URINE DIPSTICK: NEGATIVE
KETONES UR: NEGATIVE mg/dL
Leukocytes, UA: NEGATIVE
NITRITE: POSITIVE — AB
PH: 5.5 (ref 5.0–8.0)
Protein, ur: NEGATIVE mg/dL
SPECIFIC GRAVITY, URINE: 1.01 (ref 1.005–1.030)

## 2016-01-03 LAB — IRON AND TIBC
IRON: 52 ug/dL (ref 28–170)
SATURATION RATIOS: 18 % (ref 10.4–31.8)
TIBC: 294 ug/dL (ref 250–450)
UIBC: 242 ug/dL

## 2016-01-03 LAB — PROTIME-INR
INR: 0.97
INR: 0.99
Prothrombin Time: 12.8 seconds (ref 11.4–15.2)
Prothrombin Time: 13.1 seconds (ref 11.4–15.2)

## 2016-01-03 LAB — FERRITIN: Ferritin: 87 ng/mL (ref 11–307)

## 2016-01-03 LAB — URINE MICROSCOPIC-ADD ON
RBC / HPF: NONE SEEN RBC/hpf (ref 0–5)
SQUAMOUS EPITHELIAL / LPF: NONE SEEN

## 2016-01-03 LAB — FOLATE: FOLATE: 15.5 ng/mL (ref >5.9)

## 2016-01-03 LAB — TROPONIN I: Troponin I: <0.03 ng/mL (ref <0.03)

## 2016-01-03 LAB — TSH: TSH: 1.435 u[IU]/mL (ref 0.350–4.500)

## 2016-01-03 LAB — VITAMIN B12: Vitamin B-12: 498 pg/mL (ref 180–914)

## 2016-01-03 LAB — APTT: aPTT: 28 seconds (ref 24–36)

## 2016-01-03 MED ORDER — SENNOSIDES-DOCUSATE SODIUM 8.6-50 MG PO TABS: 1.0000 | ORAL_TABLET | Freq: Every evening | ORAL | Status: DC | PRN

## 2016-01-03 MED ORDER — DILTIAZEM HCL ER COATED BEADS 240 MG PO CP24
360.0000 mg | ORAL_CAPSULE | Freq: Every day | ORAL | Status: DC
  Administered 2016-01-04: 360 mg via ORAL
  Filled 2016-01-03: qty 1

## 2016-01-03 MED ORDER — ONDANSETRON HCL 4 MG/2ML IJ SOLN: 4.0000 mg | Freq: Four times a day (QID) | INTRAMUSCULAR | Status: DC | PRN

## 2016-01-03 MED ORDER — SODIUM CHLORIDE 0.9 % IV SOLN
INTRAVENOUS | Status: DC
  Administered 2016-01-03: 18:00:00 via INTRAVENOUS

## 2016-01-03 MED ORDER — ALBUTEROL SULFATE (2.5 MG/3ML) 0.083% IN NEBU
2.5000 mg | INHALATION_SOLUTION | RESPIRATORY_TRACT | Status: DC | PRN
Start: 2016-01-03 — End: 2016-01-04
  Administered 2016-01-03: 2.5 mg via RESPIRATORY_TRACT
  Filled 2016-01-03: qty 3

## 2016-01-03 MED ORDER — VITAMIN D 1000 UNITS PO TABS
5000.0000 [IU] | ORAL_TABLET | Freq: Every day | ORAL | Status: DC
  Administered 2016-01-04: 5000 [IU] via ORAL

## 2016-01-03 MED ORDER — FAMOTIDINE 20 MG PO TABS
20.0000 mg | ORAL_TABLET | Freq: Every day | ORAL | Status: DC
  Administered 2016-01-04: 20 mg via ORAL
  Filled 2016-01-03: qty 1

## 2016-01-03 MED ORDER — STROKE: EARLY STAGES OF RECOVERY BOOK
Freq: Once | Status: AC
  Administered 2016-01-04: 1
  Filled 2016-01-03: qty 1

## 2016-01-03 MED ORDER — TRAZODONE HCL 50 MG PO TABS: 300.0000 mg | ORAL_TABLET | Freq: Every day | ORAL | Status: DC

## 2016-01-03 MED ORDER — PRAVASTATIN SODIUM 40 MG PO TABS
40.0000 mg | ORAL_TABLET | Freq: Every day | ORAL | Status: DC
  Administered 2016-01-04: 40 mg via ORAL
  Filled 2016-01-03: qty 1

## 2016-01-03 MED ORDER — DARIFENACIN HYDROBROMIDE ER 7.5 MG PO TB24
7.5000 mg | ORAL_TABLET | Freq: Every day | ORAL | Status: DC
  Administered 2016-01-04: 7.5 mg via ORAL
  Filled 2016-01-03: qty 1

## 2016-01-03 MED ORDER — ASPIRIN 325 MG PO TABS: 325.0000 mg | ORAL_TABLET | Freq: Once | ORAL | Status: DC

## 2016-01-03 MED ORDER — IMIPRAMINE HCL 25 MG PO TABS: 25.0000 mg | ORAL_TABLET | Freq: Every day | ORAL | Status: DC

## 2016-01-03 MED ORDER — MECLIZINE HCL 12.5 MG PO TABS: 25.0000 mg | ORAL_TABLET | Freq: Three times a day (TID) | ORAL | Status: DC | PRN

## 2016-01-03 MED ORDER — ASPIRIN EC 81 MG PO TBEC: 81.0000 mg | DELAYED_RELEASE_TABLET | Freq: Every day | ORAL | Status: DC

## 2016-01-03 MED ORDER — ASPIRIN EC 81 MG PO TBEC
162.0000 mg | DELAYED_RELEASE_TABLET | Freq: Every day | ORAL | Status: DC
  Administered 2016-01-04: 162 mg via ORAL
  Filled 2016-01-03: qty 2

## 2016-01-03 MED ORDER — LEVOTHYROXINE SODIUM 25 MCG PO TABS
125.0000 ug | ORAL_TABLET | Freq: Every day | ORAL | Status: DC
  Administered 2016-01-04: 125 ug via ORAL
  Filled 2016-01-03: qty 1

## 2016-01-03 MED ORDER — HEPARIN SODIUM (PORCINE) 5000 UNIT/ML IJ SOLN
5000.0000 [IU] | Freq: Three times a day (TID) | INTRAMUSCULAR | Status: DC
  Administered 2016-01-03 – 2016-01-04 (×3): 5000 [IU] via SUBCUTANEOUS
  Filled 2016-01-03 (×3): qty 1

## 2016-01-03 MED ORDER — POTASSIUM CHLORIDE ER 10 MEQ PO TBCR
10.0000 meq | EXTENDED_RELEASE_TABLET | Freq: Every day | ORAL | Status: DC
  Filled 2016-01-03 (×2): qty 1

## 2016-01-03 MED ORDER — LUBIPROSTONE 24 MCG PO CAPS
24.0000 ug | ORAL_CAPSULE | Freq: Two times a day (BID) | ORAL | Status: DC
  Administered 2016-01-04 (×2): 24 ug via ORAL
  Filled 2016-01-03 (×2): qty 1

## 2016-01-03 MED ORDER — MOMETASONE FURO-FORMOTEROL FUM 200-5 MCG/ACT IN AERO
2.0000 | INHALATION_SPRAY | Freq: Two times a day (BID) | RESPIRATORY_TRACT | Status: DC
  Administered 2016-01-04: 2 via RESPIRATORY_TRACT
  Filled 2016-01-03: qty 8.8

## 2016-01-03 MED ORDER — MOMETASONE FURO-FORMOTEROL FUM 200-5 MCG/ACT IN AERO
INHALATION_SPRAY | RESPIRATORY_TRACT | Status: AC
  Filled 2016-01-03: qty 8.8

## 2016-01-03 MED ORDER — OXYBUTYNIN CHLORIDE 5 MG PO TABS
5.0000 mg | ORAL_TABLET | Freq: Three times a day (TID) | ORAL | Status: DC
  Administered 2016-01-04 (×2): 5 mg via ORAL
  Filled 2016-01-03 (×2): qty 1

## 2016-01-03 MED ORDER — POTASSIUM CHLORIDE CRYS ER 20 MEQ PO TBCR
40.0000 meq | EXTENDED_RELEASE_TABLET | Freq: Once | ORAL | Status: DC
  Filled 2016-01-03: qty 2

## 2016-01-03 MED ORDER — ASPIRIN 300 MG RE SUPP
300.0000 mg | Freq: Once | RECTAL | Status: AC
  Administered 2016-01-03: 300 mg via RECTAL
  Filled 2016-01-03: qty 1

## 2016-01-03 MED ORDER — DEXTROSE 5 % IV SOLN: 1.0000 g | Freq: Once | INTRAVENOUS | Status: DC

## 2016-01-03 NOTE — ED Notes (Signed)
Pt back from X-ray.  

## 2016-01-03 NOTE — ED Notes (Signed)
Code stroke activated at this time Connie Ponce

## 2016-01-03 NOTE — ED Notes (Signed)
edp in room  

## 2016-01-03 NOTE — ED Provider Notes (Signed)
[facility] DEPT Provider Note   CSN: [id number] Arrival date & time: 01/03/16  1017     History   Chief Complaint Chief Complaint  Patient presents with  . Numbness    HPI Connie Ponce is a 70 y.o. female.  HPI Pt was seen at 1035. Per pt, c/o unknown onset and persistence of constant right arm "numbness" that she noticed when she woke up this morning. Pt LKW was last night when she went to bed (2030). Pt's family states pt's "gait is unsteady." Pt states she went to the [facility] for a blood draw and was told to come to the ED for evaluation. Denies focal motor weakness, no CP/palpitations, no SOB/cough, no abd pain, no N/V/D, no fevers, no rash.    Past Medical History:  Diagnosis Date  . Anxiety   . Carpal tunnel syndrome   . COPD (chronic obstructive pulmonary disease) ([place])   . Gastric ulcer   . GERD (gastroesophageal reflux disease)   . Hyperglycemia   . Hyperlipidemia   . Hypertension   . Hypothyroidism   . Osteoarthritis   . Peptic ulcer disease   . Pneumonia 2010  . Renal insufficiency   . 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 Stroke ([place]) 1990s   mild  . TIA (transient ischemic attack)     Patient Active Problem List   Diagnosis Date Noted  . Absolute anemia 07/03/2015  . Proctalgia   . Rectal pain 01/04/2015  . Hemorrhoids 01/04/2015  . Diarrhea 01/04/2015  . Gastric ulceration   . Gastric ulcer 11/29/2014  . Toxic metabolic encephalopathy 33/82/5053  . UTI (lower urinary tract infection) 09/19/2014  . ARF (acute renal failure) ([place])   . LUQ pain 07/28/2014  . Nausea with vomiting 07/28/2014  . Bilateral lower extremity edema 07/28/2014  . Thrombocytopenia ([place]) 04/12/2014  . RLQ abdominal pain 01/22/2012  . Constipation 01/22/2012  . GERD (gastroesophageal reflux disease) 01/22/2012    Past Surgical History:  Procedure Laterality Date  . ABDOMINAL HYSTERECTOMY    . APPENDECTOMY    . BACK SURGERY    . BONE MARROW ASPIRATION Left  04/22/14  . BONE MARROW BIOPSY Left 04/22/14  . CHOLECYSTECTOMY    . COLONOSCOPY     ?2005, [place]  . COLONOSCOPY N/A 04/07/2015   ZJQ:BHALPF  . COLONOSCOPY WITH ESOPHAGOGASTRODUODENOSCOPY (EGD)  02/11/2012   RMR: Ulcerative/erosive reflux esophagitis, benign appearing gastric ulcer with unremarkable biopsy, no H pylori. Colonoscopy was unremarkable. Next colonoscopy in 2023  . CYSTECTOMY     cyst removed from rectal area.   . ESOPHAGOGASTRODUODENOSCOPY  01/05/2003   RMR: Normal esophagus, small hiatal hernia/ A couple of tiny antral erosions, otherwise normal stomach normal D1 and D2.   . ESOPHAGOGASTRODUODENOSCOPY N/A 08/11/2014   XTK:WIOXBDZ ulcer/HH  . ESOPHAGOGASTRODUODENOSCOPY N/A 12/01/2014   HGD:[id number] improved gastric ulcerations/p bx  . HEMORRHOID SURGERY    . JOINT REPLACEMENT Right    knee    OB History    Gravida Para Term Preterm AB Living   4         3   SAB TAB Ectopic Multiple Live Births                   Home Medications    Prior to Admission medications   Medication Sig Start Date End Date Taking? Authorizing Provider  aspirin EC 81 MG tablet Take 81 mg by mouth daily.   Yes Historical Provider, MD  budesonide-formoterol (SYMBICORT) 160-4.5 MCG/ACT  inhaler Inhale 2 puffs into the lungs 2 (two) times daily as needed (shortness of breath).    Yes Historical Provider, MD  Cholecalciferol (VITAMIN D-3) 5000 UNITS TABS Take 1 tablet by mouth daily.   Yes Historical Provider, MD  DEXILANT 60 MG capsule TAKE (1) CAPSULE BY MOUTH ONCE DAILY. 11/07/15  Yes Mahala Menghini, PA-C  diltiazem (TIAZAC) 360 MG 24 hr capsule Take 360 mg by mouth daily.   Yes Historical Provider, MD  furosemide (LASIX) 20 MG tablet Take 40 mg by mouth daily.   Yes Historical Provider, MD  Hydrocortisone (GERHARDT'S BUTT CREAM) CREA Apply 1 application topically 3 (three) times daily. Patient taking differently: Apply 1 application topically 3 (three) times daily as needed for  irritation.  10/06/15  Yes Florian Buff, MD  imipramine (TOFRANIL) 25 MG tablet Take 25 mg by mouth at bedtime.   Yes Historical Provider, MD  levothyroxine (SYNTHROID, LEVOTHROID) 125 MCG tablet Take 125 mcg by mouth daily before breakfast.   Yes Historical Provider, MD  lubiprostone (AMITIZA) 24 MCG capsule Take 24 mcg by mouth 2 (two) times daily with a meal.   Yes Historical Provider, MD  meclizine (ANTIVERT) 25 MG tablet Take 25 mg by mouth as needed for dizziness. Reported on 10/06/2015   Yes Historical Provider, MD  oxybutynin (DITROPAN) 5 MG tablet Take 1 tablet (5 mg total) by mouth 3 (three) times daily. 11/30/15  Yes Florian Buff, MD  oxyCODONE-acetaminophen (PERCOCET/ROXICET) 5-325 MG tablet 1 tablet every 6 (six) hours as needed for moderate pain or severe pain.  06/09/15  Yes Historical Provider, MD  potassium chloride (K-DUR) 10 MEQ tablet Take 10 mEq by mouth daily.  07/08/14  Yes Historical Provider, MD  pravastatin (PRAVACHOL) 40 MG tablet Take 40 mg by mouth daily.   Yes Historical Provider, MD  ranitidine (ZANTAC) 150 MG tablet Take 150 mg by mouth at bedtime.  12/08/14  Yes Historical Provider, MD  solifenacin (VESICARE) 10 MG tablet Take 10 mg by mouth daily.   Yes Historical Provider, MD  traZODone (DESYREL) 150 MG tablet Take 300 mg by mouth at bedtime.   Yes Historical Provider, MD  triamterene-hydrochlorothiazide (MAXZIDE-25) 37.5-25 MG per tablet Take 1 tablet by mouth daily.   Yes Historical Provider, MD    Family History Family History  Problem Relation Age of Onset  . Other Mother     died age 91, natural causes  . Stroke Mother   . Diabetes Daughter   . Healthy Son   . Healthy Daughter   . Healthy Daughter   . Colon cancer Neg Hx   . Liver disease Neg Hx     Social History Social History  Substance Use Topics  . Smoking status: Former Smoker    Packs/day: 0.50    Types: Cigarettes  . Smokeless tobacco: Never Used     Comment: Is trying to quit, actively  cutting back  . Alcohol use No     Allergies   Tetracyclines & related and Shellfish-derived products   Review of Systems Review of Systems ROS: Statement: All systems negative except as marked or noted in the HPI; Constitutional: Negative for fever and chills. ; ; Eyes: Negative for eye pain, redness and discharge. ; ; ENMT: Negative for ear pain, hoarseness, nasal congestion, sinus pressure and sore throat. ; ; Cardiovascular: Negative for chest pain, palpitations, diaphoresis, dyspnea and peripheral edema. ; ; Respiratory: Negative for cough, wheezing and stridor. ; ; Gastrointestinal: Negative for nausea,  vomiting, diarrhea, abdominal pain, blood in stool, hematemesis, jaundice and rectal bleeding. . ; ; Genitourinary: Negative for dysuria, flank pain and hematuria. ; ; Musculoskeletal: Negative for back pain and neck pain. Negative for swelling and trauma.; ; Skin: Negative for pruritus, rash, abrasions, blisters, bruising and skin lesion.; ; Neuro: +paresthesias, ataxia. Negative for headache, lightheadedness and neck stiffness. Negative for weakness, altered level of consciousness, altered mental status, extremity weakness, involuntary movement, seizure and syncope.       Physical Exam Updated Vital Signs BP 135/63   Pulse 63   Temp 98.7 F (37.1 C)   Resp 14   Ht _0  (1.6 m)   Wt 206 lb (93.4 kg)   SpO2 98%   BMI 36.49 kg/m   Physical Exam 1040: Physical examination:  Nursing notes reviewed; Vital signs and O2 SAT reviewed;  Constitutional: Well developed, Well nourished, Well hydrated, In no acute distress; Head:  Normocephalic, atraumatic; Eyes: EOMI, PERRL, No scleral icterus; ENMT: Mouth and pharynx normal, Mucous membranes moist; Neck: Supple, Full range of motion, No lymphadenopathy; Cardiovascular: Regular rate and rhythm, No gallop; Respiratory: Breath sounds clear & equal bilaterally, No wheezes.  Speaking full sentences with ease, Normal respiratory  effort/excursion; Chest: Nontender, Movement normal; Abdomen: Soft, Nontender, Nondistended, Normal bowel sounds; Genitourinary: No CVA tenderness; Spine:  No midline CS, TS, LS tenderness.;; Extremities: Pulses normal, No tenderness, No edema, No calf edema or asymmetry.; Neuro: AA&Ox3, Major CN grossly intact. Speech clear.  No facial droop.  No nystagmus. Grips equal. Strength 5/5 equal bilat UE's and LE's.  DTR 2/4 equal bilat UE's and LE's.  +RUE decreased sensation, otherwise no gross sensory deficits.  Normal cerebellar testing bilat UE's (finger-nose) and LE's (heel-shin)..; Skin: Color normal, Warm, Dry.   ED Treatments / Results  Labs (all labs ordered are listed, but only abnormal results are displayed)   EKG  EKG Interpretation  Date/Time:  Wednesday January 03 2016 10:27:58 EDT Ventricular Rate:  73 PR Interval:    QRS Duration: 138 QT Interval:  417 QTC Calculation: 460 R Axis:   22 Text Interpretation:  Sinus rhythm Left axis deviation Left bundle branch block When compared with ECG of 10/25/2008 No significant change was found Confirmed by [facility]  MD, Nunzio Cory [phone]) on 01/03/2016 10:54:40 AM       Radiology   Procedures Procedures (including critical care time)  Medications Ordered in ED Medications - No data to display   Initial Impression / Assessment and Plan / ED Course  I have reviewed the triage vital signs and the nursing notes.  Pertinent labs & imaging results that were available during my care of the patient were reviewed by me and considered in my medical decision making (see chart for details).  MDM Reviewed: previous chart, nursing note and vitals Reviewed previous: labs and ECG Interpretation: labs, ECG, x-ray, CT scan and MRI   Results for orders placed or performed during the hospital encounter of 01/03/16  Protime-INR  Result Value Ref Range   Prothrombin Time 12.8 11.4 - 15.2 seconds   INR 0.97   APTT  Result Value Ref Range    aPTT 28 24 - 36 seconds  Comprehensive metabolic panel  Result Value Ref Range   Sodium 135 135 - 145 mmol/L   Potassium 3.4 (L) 3.5 - 5.1 mmol/L   Chloride 102 101 - 111 mmol/L   CO2 25 22 - 32 mmol/L   Glucose, Bld 101 (H) 65 - 99 mg/dL   BUN  24 (H) 6 - 20 mg/dL   Creatinine, Ser 1.41 (H) 0.44 - 1.00 mg/dL   Calcium 9.2 8.9 - 10.3 mg/dL   Total Protein 6.6 6.5 - 8.1 g/dL   Albumin 3.4 (L) 3.5 - 5.0 g/dL   AST 17 15 - 41 U/L   ALT 11 (L) 14 - 54 U/L   Alkaline Phosphatase 91 38 - 126 U/L   Total Bilirubin 0.3 0.3 - 1.2 mg/dL   GFR calc non Af Amer 36 (L) >60 mL/min   GFR calc Af Amer 42 (L) >60 mL/min   Anion gap 8 5 - 15  Urinalysis, Routine w reflex microscopic (not at [facility])  Result Value Ref Range   Color, Urine YELLOW YELLOW   APPearance CLEAR CLEAR   Specific Gravity, Urine 1.010 1.005 - 1.030   pH 5.5 5.0 - 8.0   Glucose, UA NEGATIVE NEGATIVE mg/dL   Hgb urine dipstick NEGATIVE NEGATIVE   Bilirubin Urine NEGATIVE NEGATIVE   Ketones, ur NEGATIVE NEGATIVE mg/dL   Protein, ur NEGATIVE NEGATIVE mg/dL   Nitrite POSITIVE (A) NEGATIVE   Leukocytes, UA NEGATIVE NEGATIVE  Troponin I  Result Value Ref Range   Troponin I <0.03 <0.03 ng/mL  Urine microscopic-add on  Result Value Ref Range   Squamous Epithelial / LPF NONE SEEN NONE SEEN   WBC, UA 0-5 0 - 5 WBC/hpf   RBC / HPF NONE SEEN 0 - 5 RBC/hpf   Bacteria, UA MANY (A) NONE SEEN   Dg Chest 2 View Result Date: 01/03/2016 CLINICAL DATA:  Shortness of breath for several months EXAM: CHEST  2 VIEW COMPARISON:  09/19/2014 FINDINGS: Cardiac shadow is within normal limits. The lungs are hyperinflated consistent with COPD. No focal infiltrate or sizable effusion is seen. Stable degenerative change of the thoracic spine is noted. The aortic calcifications are noted. IMPRESSION: COPD without acute abnormality. Electronically Signed   By: Inez Catalina M.D.   On: 01/03/2016 11:16   Ct Head Wo Contrast Result Date:  01/03/2016 CLINICAL DATA:  Right arm numbness.  Unsteady gait. EXAM: CT HEAD WITHOUT CONTRAST TECHNIQUE: Contiguous axial images were obtained from the base of the skull through the vertex without intravenous contrast. COMPARISON:  CT scan of Jul 31, 2007. FINDINGS: Brain: No mass effect or midline shift is noted. Ventricular size is within normal limits. There is no evidence of mass lesion, hemorrhage or acute infarction. Vascular: No abnormality seen. Skull: Bony calvarium appears normal. Sinuses/Orbits: Paranasal sinuses are unremarkable. Other: None. IMPRESSION: Normal head CT. These results were called by telephone at the time of interpretation on 01/03/2016 at 11:04 am to Dr. Francine Graven , who verbally acknowledged these results. Electronically Signed   By: Marijo Conception, M.D.   On: 01/03/2016 11:05   Mr Brain Wo Contrast (neuro Protocol) Result Date: 01/03/2016 CLINICAL DATA:  Right arm numbness. EXAM: MRI HEAD WITHOUT CONTRAST TECHNIQUE: Multiplanar, multiecho pulse sequences of the brain and surrounding structures were obtained without intravenous contrast. COMPARISON:  CT head 01/03/2016 FINDINGS: Brain: Negative for acute infarct. Small white matter hyperintensities compatible with mild chronic microvascular ischemia. Negative for hemorrhage or mass lesion. No shift of the midline structures. Vascular: Normal arterial flow voids. Skull and upper cervical spine: Thickened skull with a benign appearance. Sinuses/Orbits: Paranasal sinuses demonstrate mild mucosal edema. Right mastoid sinus effusion. No obstructing mass in the nasopharynx. Bilateral lens replacement. No orbital mass lesion. Other: None IMPRESSION: No acute abnormality.  Mild chronic microvascular ischemia. Electronically Signed  By: Franchot Gallo M.D.   On: 01/03/2016 11:40     1315:  Pt not code stroke due to LKW last night. MRI negative; will admit for TIA. +UTI, UC pending; will dose IV rocephin. Dx and testing d/w pt and  family.  Questions answered.  Verb understanding, agreeable to admit. T/C to [facility]. Candiss Norse, case discussed, including:  HPI, pertinent PM/SHx, VS/PE, dx testing, ED course and treatment:  Agreeable to admit, requests to write temporary orders, obtain observation tele bed to team APAdmits.    Final Clinical Impressions(s) / ED Diagnoses   Final diagnoses:  None    New Prescriptions New Prescriptions   No medications on file     Francine Graven, DO 01/05/16 1550

## 2016-01-03 NOTE — ED Notes (Signed)
Pt returned from CT °

## 2016-01-03 NOTE — H&P (Signed)
TRH H&P   Patient Demographics:    Connie Ponce, is a 70 y.o. female  MRN: [id number]   DOB - 05-Nov-1945  Admit Date - 01/03/2016  Outpatient Primary MD for the patient is [name], NP    Patient coming from: Home  Chief Complaint  Patient presents with  . Numbness      HPI:    Connie Ponce  is a 70 y.o. female, Straight of TIA, hypertension, GERD, dyslipidemia, COPD, carpal tunnel, DJD, GERD who comes to the hospital with 12 hour history of some right arm and leg numbness and weakness which started this morning when she woke up, she also has mild neck pain which is constant, nonradiating, no aggravating or relieving factors and present for at least one day. She also says that for the last 2 weeks she has noticed some problems swallowing regular food and it feels like it sticks to her throat. No history seizures to aspiration. She has some chronic right leg weakness but that has not changed. No other subjective complaints.  In the ER MRI brain unremarkable, I was called to admit the patient for TIA workup.    Review of systems:    In addition to the HPI above,   No Fever-chills, No Headache, No changes with Vision or hearing, No problems swallowing food or Liquids, No Chest pain, Cough or Shortness of Breath, No Abdominal pain, No Nausea or Vommitting, Bowel movements are regular, No Blood in stool or Urine, No dysuria, No new skin rashes or bruises, No new joints pains-aches,  No new weakness, tingling, numbness in any extremity, except as above No recent weight gain or loss, No polyuria, polydypsia or polyphagia, No significant Mental Stressors.  A full 10 point Review of Systems was done,  except as stated above, all other Review of Systems were negative.   With Past History of the following :    Past Medical History:  Diagnosis Date  . Anxiety   . Carpal tunnel syndrome   . COPD (chronic obstructive pulmonary disease) ([place])   . Gastric ulcer   . GERD (gastroesophageal reflux disease)   . Hyperglycemia   . Hyperlipidemia   . Hypertension   . Hypothyroidism   . Osteoarthritis   . Peptic ulcer disease   . Pneumonia 2010  . Renal insufficiency   .  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 Stroke ([place]) 1990s   mild  . TIA (transient ischemic attack)       Past Surgical History:  Procedure Laterality Date  . ABDOMINAL HYSTERECTOMY    . APPENDECTOMY    . BACK SURGERY    . BONE MARROW ASPIRATION Left 04/22/14  . BONE MARROW BIOPSY Left 04/22/14  . CHOLECYSTECTOMY    . COLONOSCOPY     ?2005, [place]  . COLONOSCOPY N/A 04/07/2015   YQM:VHQION  . COLONOSCOPY WITH ESOPHAGOGASTRODUODENOSCOPY (EGD)  02/11/2012   RMR: Ulcerative/erosive reflux esophagitis, benign appearing gastric ulcer with unremarkable biopsy, no H pylori. Colonoscopy was unremarkable. Next colonoscopy in 2023  . CYSTECTOMY     cyst removed from rectal area.   . ESOPHAGOGASTRODUODENOSCOPY  01/05/2003   RMR: Normal esophagus, small hiatal hernia/ A couple of tiny antral erosions, otherwise normal stomach normal D1 and D2.   . ESOPHAGOGASTRODUODENOSCOPY N/A 08/11/2014   GEX:BMWUXLK ulcer/HH  . ESOPHAGOGASTRODUODENOSCOPY N/A 12/01/2014   GMW:[id number] improved gastric ulcerations/p bx  . HEMORRHOID SURGERY    . JOINT REPLACEMENT Right    knee      Social History:     Social History  Substance Use Topics  . Smoking status: Former Smoker    Packs/day: 0.50    Types: Cigarettes  . Smokeless tobacco: Never Used     Comment: Is trying to quit, actively cutting back  . Alcohol use No      Family History :     Family History  Problem Relation Age of Onset  . Other Mother     died age  46, natural causes  . Stroke Mother   . Diabetes Daughter   . Healthy Son   . Healthy Daughter   . Healthy Daughter   . Colon cancer Neg Hx   . Liver disease Neg Hx        Home Medications:   Prior to Admission medications   Medication Sig Start Date End Date Taking? Authorizing Provider  aspirin EC 81 MG tablet Take 81 mg by mouth daily.   Yes Historical Provider, MD  budesonide-formoterol (SYMBICORT) 160-4.5 MCG/ACT inhaler Inhale 2 puffs into the lungs 2 (two) times daily as needed (shortness of breath).    Yes Historical Provider, MD  Cholecalciferol (VITAMIN D-3) 5000 UNITS TABS Take 1 tablet by mouth daily.   Yes Historical Provider, MD  DEXILANT 60 MG capsule TAKE (1) CAPSULE BY MOUTH ONCE DAILY. 11/07/15  Yes Mahala Menghini, PA-C  diltiazem (TIAZAC) 360 MG 24 hr capsule Take 360 mg by mouth daily.   Yes Historical Provider, MD  furosemide (LASIX) 20 MG tablet Take 40 mg by mouth daily.   Yes Historical Provider, MD  Hydrocortisone (GERHARDT'S BUTT CREAM) CREA Apply 1 application topically 3 (three) times daily. Patient taking differently: Apply 1 application topically 3 (three) times daily as needed for irritation.  10/06/15  Yes Florian Buff, MD  imipramine (TOFRANIL) 25 MG tablet Take 25 mg by mouth at bedtime.   Yes Historical Provider, MD  levothyroxine (SYNTHROID, LEVOTHROID) 125 MCG tablet Take 125 mcg by mouth daily before breakfast.   Yes Historical Provider, MD  lubiprostone (AMITIZA) 24 MCG capsule Take 24 mcg by mouth 2 (two) times daily with a meal.   Yes Historical Provider, MD  meclizine (ANTIVERT) 25 MG tablet Take 25 mg by mouth as needed for dizziness. Reported on 10/06/2015   Yes Historical Provider, MD  oxybutynin (DITROPAN) 5 MG  tablet Take 1 tablet (5 mg total) by mouth 3 (three) times daily. 11/30/15  Yes Florian Buff, MD  oxyCODONE-acetaminophen (PERCOCET/ROXICET) 5-325 MG tablet 1 tablet every 6 (six) hours as needed for moderate pain or severe pain.  06/09/15   Yes Historical Provider, MD  potassium chloride (K-DUR) 10 MEQ tablet Take 10 mEq by mouth daily.  07/08/14  Yes Historical Provider, MD  pravastatin (PRAVACHOL) 40 MG tablet Take 40 mg by mouth daily.   Yes Historical Provider, MD  ranitidine (ZANTAC) 150 MG tablet Take 150 mg by mouth at bedtime.  12/08/14  Yes Historical Provider, MD  solifenacin (VESICARE) 10 MG tablet Take 10 mg by mouth daily.   Yes Historical Provider, MD  traZODone (DESYREL) 150 MG tablet Take 300 mg by mouth at bedtime.   Yes Historical Provider, MD  triamterene-hydrochlorothiazide (MAXZIDE-25) 37.5-25 MG per tablet Take 1 tablet by mouth daily.   Yes Historical Provider, MD     Allergies:     Allergies  Allergen Reactions  . Tetracyclines & Related   . Shellfish-Derived Products Nausea And Vomiting     Physical Exam:   Vitals  Blood pressure 135/63, pulse 63, temperature 98.7 F (37.1 C), resp. rate 14, height 5' 3" (1.6 m), weight 93.4 kg (206 lb), SpO2 98 %.   1. General Elderly pleasant African-American female lying in bed in NAD,     2. Normal affect and insight, Not Suicidal or Homicidal, Awake Alert, Oriented X 3.  3. No F.N deficits, ALL C.Nerves Intact, Strength 5/5 all 3 extremities, but 4/5 in the right upper extremity, Sensation intact all 4 extremities, Plantars down going.  4. Ears and Eyes appear Normal, Conjunctivae clear, PERRLA. Moist Oral Mucosa.  5. Supple Neck, No JVD, No cervical lymphadenopathy appriciated, No Carotid Bruits.  6. Symmetrical Chest wall movement, Good air movement bilaterally, CTAB.  7. RRR, No Gallops, Rubs or Murmurs, No Parasternal Heave.  8. Positive Bowel Sounds, Abdomen Soft, No tenderness, No organomegaly appriciated,No rebound -guarding or rigidity.  9.  No Cyanosis, Normal Skin Turgor, No Skin Rash or Bruise.  10. Good muscle tone,  joints appear normal , no effusions, Normal ROM.  11. No Palpable Lymph Nodes in Neck or Axillae      Data Review:     CBC  Recent Labs Lab 01/03/16 1008  WBC 4.8  HGB 12.6  HCT 37.3  PLT 166  MCV 91.2  MCH 30.8  MCHC 33.8  RDW 12.2   ------------------------------------------------------------------------------------------------------------------  Chemistries   Recent Labs Lab 01/03/16 1008  NA 135  K 3.4*  CL 102  CO2 25  GLUCOSE 101*  BUN 24*  CREATININE 1.41*  CALCIUM 9.2  AST 17  ALT 11*  ALKPHOS 91  BILITOT 0.3   ------------------------------------------------------------------------------------------------------------------ estimated creatinine clearance is 39.2 mL/min (by C-G formula based on SCr of 1.41 mg/dL (H)). ------------------------------------------------------------------------------------------------------------------ No results for input(s): TSH, T4TOTAL, T3FREE, THYROIDAB in the last 72 hours.  Invalid input(s): FREET3  Coagulation profile  Recent Labs Lab 01/03/16 1008  INR 0.97   ------------------------------------------------------------------------------------------------------------------- No results for input(s): DDIMER in the last 72 hours. -------------------------------------------------------------------------------------------------------------------  Cardiac Enzymes  Recent Labs Lab 01/03/16 1008  TROPONINI <0.03   ------------------------------------------------------------------------------------------------------------------ No results found for: BNP  ---------------------------------------------------------------------------------------------------------------  Urinalysis    Component Value Date/Time   COLORURINE YELLOW 01/03/2016 [place] 01/03/2016 1032   LABSPEC 1.010 01/03/2016 1032   PHURINE 5.5 01/03/2016 1032   GLUCOSEU NEGATIVE 01/03/2016 1032   HGBUR  NEGATIVE 01/03/2016 1032   [place] 01/03/2016 [place] 01/03/2016 1032   PROTEINUR NEGATIVE 01/03/2016 1032    UROBILINOGEN 0.2 09/19/2014 0105   NITRITE POSITIVE (A) 01/03/2016 1032   LEUKOCYTESUR NEGATIVE 01/03/2016 1032    ----------------------------------------------------------------------------------------------------------------   Imaging Results:    Dg Chest 2 View  Result Date: 01/03/2016 CLINICAL DATA:  Shortness of breath for several months EXAM: CHEST  2 VIEW COMPARISON:  09/19/2014 FINDINGS: Cardiac shadow is within normal limits. The lungs are hyperinflated consistent with COPD. No focal infiltrate or sizable effusion is seen. Stable degenerative change of the thoracic spine is noted. The aortic calcifications are noted. IMPRESSION: COPD without acute abnormality. Electronically Signed   By: Inez Catalina M.D.   On: 01/03/2016 11:16   Ct Head Wo Contrast  Result Date: 01/03/2016 CLINICAL DATA:  Right arm numbness.  Unsteady gait. EXAM: CT HEAD WITHOUT CONTRAST TECHNIQUE: Contiguous axial images were obtained from the base of the skull through the vertex without intravenous contrast. COMPARISON:  CT scan of Jul 31, 2007. FINDINGS: Brain: No mass effect or midline shift is noted. Ventricular size is within normal limits. There is no evidence of mass lesion, hemorrhage or acute infarction. Vascular: No abnormality seen. Skull: Bony calvarium appears normal. Sinuses/Orbits: Paranasal sinuses are unremarkable. Other: None. IMPRESSION: Normal head CT. These results were called by telephone at the time of interpretation on 01/03/2016 at 11:04 am to Dr. Francine Graven , who verbally acknowledged these results. Electronically Signed   By: Marijo Conception, M.D.   On: 01/03/2016 11:05   Mr Brain Wo Contrast (neuro Protocol)  Result Date: 01/03/2016 CLINICAL DATA:  Right arm numbness. EXAM: MRI HEAD WITHOUT CONTRAST TECHNIQUE: Multiplanar, multiecho pulse sequences of the brain and surrounding structures were obtained without intravenous contrast. COMPARISON:  CT head 01/03/2016 FINDINGS: Brain:  Negative for acute infarct. Small white matter hyperintensities compatible with mild chronic microvascular ischemia. Negative for hemorrhage or mass lesion. No shift of the midline structures. Vascular: Normal arterial flow voids. Skull and upper cervical spine: Thickened skull with a benign appearance. Sinuses/Orbits: Paranasal sinuses demonstrate mild mucosal edema. Right mastoid sinus effusion. No obstructing mass in the nasopharynx. Bilateral lens replacement. No orbital mass lesion. Other: None IMPRESSION: No acute abnormality.  Mild chronic microvascular ischemia. Electronically Signed   By: Franchot Gallo M.D.   On: 01/03/2016 11:40    My personal review of EKG: Rhythm NSR, LBBB   Assessment & Plan:     1. C-spine nerve impingement versus TIA with right arm weakness. MRI brain nonacute, she continues to have right arm weakness and she is right-handed, will be admitted for TIA workup, continue aspirin and statin, neurology requested to evaluate the patient and C-spine MRI ordered as well.  2. Dehydration with ARF. Hold diuretics, hydrate and monitor.  3. Hypokalemia. Replace.  4. Dyslipidemia. Continue statin check lipid panel.  5. HTN - for now continue Diltiazem.  6. GERD - Zantac continued.  7. Hypothyroidism. Continue Synthroid check TSH.     DVT Prophylaxis Heparin  AM Labs Ordered, also please review Full Orders  Family Communication: Admission, patients condition and plan of care including tests being ordered have been discussed with the patien who indicates understanding and agree with the plan and Code Status.  Code Status Full  Likely DC to  Home  Condition Fair  Consults called: Neuro    Admission status: Obs    Time spent in minutes : [place]  M.D on 01/03/2016 at 1:40 PM  Between 7am to 7pm - Pager - [phone]. After 7pm go to [email or web address] - password [facility]  [facility] - Office  [phone]

## 2016-01-03 NOTE — Progress Notes (Signed)
BEEPER 1046 TABLE 1048 DONE 1051 COMPLETED, SOC, CALLED GR [phone]

## 2016-01-03 NOTE — ED Notes (Signed)
Pt has returned from MRI. 

## 2016-01-03 NOTE — ED Notes (Signed)
Dr Clarene DukeMcmanus notified of pts symptoms and last known well

## 2016-01-03 NOTE — ED Triage Notes (Signed)
Pt states that when she went to bed last night 830 pm she was fine. Woke up with approx 0430 with right arm numbness and unable to feel touch. [facility] and was told to come here. Slight headache over right eye. Gait unsteady per family member

## 2016-01-03 NOTE — Consult Note (Signed)
[place] A. Merlene Laughter, MD     [email or web address]          Connie Ponce is an 70 y.o. female.   ASSESSMENT/PLAN: 1 . Likely post-lesional recrudescence from chronic lacunar infarct in the setting of low-grade UTI, acute renal failure and dehydration. No evidence of acute infarct. Physical examination does not support radiculopathy. 2. Chronic left putaminal infarct. This is likely a chronic lacunar infarct. Risk factors hypertension, age and dyslipidemia. The patient aspirin will be increased to 162 mg daily. She currently is nothing by mouth and therefore will give her aspirin rectally for now.    The patient is an 70 year old black female who woke up yesterday with numbness and weakness of the right upper extremity. The chart indicates that she had similar symptoms of the right leg but in questioning the patient she denies any numbness of the right leg. Her primary symptom seems to be numbness of the right upper extremity. She does report some heaviness. Again the legs are not involved. She has had some issues with pain and numbness of the legs but both legs are involving these occur chronically and episodically. She has had some neck pain but does not seems related to the right upper extremity symptoms. She denies chest pain, headaches or shortness of breath. She does not report dysarthria and dysphagia. The patient reports that she did have a mini stroke in the past with facial weakness and right upper extremity weakness. The primary area of concern however was facial weakness. She recovered from this. The review systems is otherwise negative.  GENERAL: This is a pleasant female in no acute distress.  HEENT: Normal  ABDOMEN: soft  EXTREMITIES: No edema   BACK: Normal  SKIN: Normal by inspection.    MENTAL STATUS: Alert and oriented. Speech, language and cognition are generally intact. Judgment and insight normal.   CRANIAL NERVES: Pupils are equal, round and  reactive to light and accomodation; extra ocular movements are full, there is no significant nystagmus; visual fields are full; upper and lower facial muscles are normal in strength and symmetric, there is no flattening of the nasolabial folds; tongue is midline; uvula is midline; shoulder elevation is normal.  MOTOR: She has weakness of the right upper extremity and all muscles tested including deltoid, biceps, triceps, wrist extension and handgrip. Tone is slightly down. The other extremity shows normal tone, bulk and strength. There is no pronator drift.  COORDINATION: Left finger to nose is normal, right finger to nose is normal, No rest tremor; no intention tremor; no postural tremor; no bradykinesia.  REFLEXES: Deep tendon reflexes are symmetrical but diminished throughout. Babinski reflexes are flexor bilaterally.   SENSATION: There is reduced sensation to temperature light touch involving the right upper extremity. Legs are normal.     Blood pressure (!) 144/62, pulse 72, temperature 98.2 F (36.8 C), temperature source Oral, resp. rate 18, height _0  (1.6 m), weight 198 lb 3.2 oz (89.9 kg), SpO2 95 %.  Past Medical History:  Diagnosis Date  . Anxiety   . Carpal tunnel syndrome   . COPD (chronic obstructive pulmonary disease) ([place])   . Gastric ulcer   . GERD (gastroesophageal reflux disease)   . Hyperglycemia   . Hyperlipidemia   . Hypertension   . Hypothyroidism   . Osteoarthritis   . Peptic ulcer disease   . Pneumonia 2010  . Renal insufficiency   . 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 Stroke [facility]) 1990s  mild  . TIA (transient ischemic attack)     Past Surgical History:  Procedure Laterality Date  . ABDOMINAL HYSTERECTOMY    . APPENDECTOMY    . BACK SURGERY    . BONE MARROW ASPIRATION Left 04/22/14  . BONE MARROW BIOPSY Left 04/22/14  . CHOLECYSTECTOMY    . COLONOSCOPY     ?2005, [place]  . COLONOSCOPY N/A 04/07/2015   XNA:[id number]  . COLONOSCOPY WITH  ESOPHAGOGASTRODUODENOSCOPY (EGD)  02/11/2012   RMR: Ulcerative/erosive reflux esophagitis, benign appearing gastric ulcer with unremarkable biopsy, no H pylori. Colonoscopy was unremarkable. Next colonoscopy in 2023  . CYSTECTOMY     cyst removed from rectal area.   . ESOPHAGOGASTRODUODENOSCOPY  01/05/2003   RMR: Normal esophagus, small hiatal hernia/ A couple of tiny antral erosions, otherwise normal stomach normal D1 and D2.   . ESOPHAGOGASTRODUODENOSCOPY N/A 08/11/2014   KGU:RKYHCWC ulcer/HH  . ESOPHAGOGASTRODUODENOSCOPY N/A 12/01/2014   BJS:[id number] improved gastric ulcerations/p bx  . HEMORRHOID SURGERY    . JOINT REPLACEMENT Right    knee    Family History  Problem Relation Age of Onset  . Other Mother     died age 47, natural causes  . Stroke Mother   . Diabetes Daughter   . Healthy Son   . Healthy Daughter   . Healthy Daughter   . Colon cancer Neg Hx   . Liver disease Neg Hx     Social History:  reports that she has quit smoking. Her smoking use included Cigarettes. She smoked 0.50 packs per day. She has never used smokeless tobacco. She reports that she does not drink alcohol or use drugs.  Allergies:  Allergies  Allergen Reactions  . Tetracyclines & Related   . Shellfish-Derived Products Nausea And Vomiting    Medications: Prior to Admission medications   Medication Sig Start Date End Date Taking? Authorizing Provider  aspirin EC 81 MG tablet Take 81 mg by mouth daily.   Yes Historical Provider, MD  budesonide-formoterol (SYMBICORT) 160-4.5 MCG/ACT inhaler Inhale 2 puffs into the lungs 2 (two) times daily as needed (shortness of breath).    Yes Historical Provider, MD  Cholecalciferol (VITAMIN D-3) 5000 UNITS TABS Take 1 tablet by mouth daily.   Yes Historical Provider, MD  DEXILANT 60 MG capsule TAKE (1) CAPSULE BY MOUTH ONCE DAILY. 11/07/15  Yes Mahala Menghini, PA-C  diltiazem (TIAZAC) 360 MG 24 hr capsule Take 360 mg by mouth daily.   Yes Historical  Provider, MD  furosemide (LASIX) 20 MG tablet Take 40 mg by mouth daily.   Yes Historical Provider, MD  Hydrocortisone (GERHARDT'S BUTT CREAM) CREA Apply 1 application topically 3 (three) times daily. Patient taking differently: Apply 1 application topically 3 (three) times daily as needed for irritation.  10/06/15  Yes Florian Buff, MD  imipramine (TOFRANIL) 25 MG tablet Take 25 mg by mouth at bedtime.   Yes Historical Provider, MD  levothyroxine (SYNTHROID, LEVOTHROID) 125 MCG tablet Take 125 mcg by mouth daily before breakfast.   Yes Historical Provider, MD  lubiprostone (AMITIZA) 24 MCG capsule Take 24 mcg by mouth 2 (two) times daily with a meal.   Yes Historical Provider, MD  meclizine (ANTIVERT) 25 MG tablet Take 25 mg by mouth as needed for dizziness. Reported on 10/06/2015   Yes Historical Provider, MD  oxybutynin (DITROPAN) 5 MG tablet Take 1 tablet (5 mg total) by mouth 3 (three) times daily. 11/30/15  Yes Florian Buff, MD  oxyCODONE-acetaminophen (PERCOCET/ROXICET) 5-325 MG  tablet 1 tablet every 6 (six) hours as needed for moderate pain or severe pain.  06/09/15  Yes Historical Provider, MD  potassium chloride (K-DUR) 10 MEQ tablet Take 10 mEq by mouth daily.  07/08/14  Yes Historical Provider, MD  pravastatin (PRAVACHOL) 40 MG tablet Take 40 mg by mouth daily.   Yes Historical Provider, MD  ranitidine (ZANTAC) 150 MG tablet Take 150 mg by mouth at bedtime.  12/08/14  Yes Historical Provider, MD  solifenacin (VESICARE) 10 MG tablet Take 10 mg by mouth daily.   Yes Historical Provider, MD  traZODone (DESYREL) 150 MG tablet Take 300 mg by mouth at bedtime.   Yes Historical Provider, MD  triamterene-hydrochlorothiazide (MAXZIDE-25) 37.5-25 MG per tablet Take 1 tablet by mouth daily.   Yes Historical Provider, MD    Scheduled Meds: .  stroke: mapping our early stages of recovery book   Does not apply Once  . aspirin EC  81 mg Oral Daily  . [START ON 01/04/2016] cholecalciferol  5,000 Units  Oral Daily  . darifenacin  7.5 mg Oral Daily  . diltiazem (CARDIZEM CD) 24 hr capsule 360 mg  360 mg Oral Daily  . famotidine  20 mg Oral Daily  . heparin  5,000 Units Subcutaneous Q8H  . imipramine  25 mg Oral QHS  . [START ON 01/04/2016] levothyroxine  125 mcg Oral QAC breakfast  . [START ON 01/04/2016] lubiprostone  24 mcg Oral BID WC  . mometasone-formoterol  2 puff Inhalation BID  . oxybutynin  5 mg Oral TID  . [START ON 01/04/2016] potassium chloride  10 mEq Oral Daily  . potassium chloride  40 mEq Oral Once  . pravastatin  40 mg Oral q1800  . traZODone  300 mg Oral QHS   Continuous Infusions: . sodium chloride 75 mL/hr at 01/03/16 1815   PRN Meds:.albuterol, meclizine, ondansetron (ZOFRAN) IV, senna-docusate     Results for orders placed or performed during the hospital encounter of 01/03/16 (from the past 48 hour(s))  Protime-INR     Status: None   Collection Time: 01/03/16 10:08 AM  Result Value Ref Range   Prothrombin Time 12.8 11.4 - 15.2 seconds   INR 0.97   APTT     Status: None   Collection Time: 01/03/16 10:08 AM  Result Value Ref Range   aPTT 28 24 - 36 seconds  Comprehensive metabolic panel     Status: Abnormal   Collection Time: 01/03/16 10:08 AM  Result Value Ref Range   Sodium 135 135 - 145 mmol/L   Potassium 3.4 (L) 3.5 - 5.1 mmol/L   Chloride 102 101 - 111 mmol/L   CO2 25 22 - 32 mmol/L   Glucose, Bld 101 (H) 65 - 99 mg/dL   BUN 24 (H) 6 - 20 mg/dL   Creatinine, Ser 1.41 (H) 0.44 - 1.00 mg/dL   Calcium 9.2 8.9 - 10.3 mg/dL   Total Protein 6.6 6.5 - 8.1 g/dL   Albumin 3.4 (L) 3.5 - 5.0 g/dL   AST 17 15 - 41 U/L   ALT 11 (L) 14 - 54 U/L   Alkaline Phosphatase 91 38 - 126 U/L   Total Bilirubin 0.3 0.3 - 1.2 mg/dL   GFR calc non Af Amer 36 (L) >60 mL/min   GFR calc Af Amer 42 (L) >60 mL/min    Comment: (NOTE) The eGFR has been calculated using the CKD EPI equation. This calculation has not been validated in all clinical situations. eGFR's  persistently <  60 mL/min signify possible Chronic Kidney Disease.    Anion gap 8 5 - 15  Troponin I     Status: None   Collection Time: 01/03/16 10:08 AM  Result Value Ref Range   Troponin I <0.03 <0.03 ng/mL  TSH     Status: None   Collection Time: 01/03/16 10:12 AM  Result Value Ref Range   TSH 1.435 0.350 - 4.500 uIU/mL    Comment: Performed by a 3rd Generation assay with a functional sensitivity of <=0.01 uIU/mL.  Urinalysis, Routine w reflex microscopic (not at [facility])     Status: Abnormal   Collection Time: 01/03/16 10:32 AM  Result Value Ref Range   Color, Urine YELLOW YELLOW   APPearance CLEAR CLEAR   Specific Gravity, Urine 1.010 1.005 - 1.030   pH 5.5 5.0 - 8.0   Glucose, UA NEGATIVE NEGATIVE mg/dL   Hgb urine dipstick NEGATIVE NEGATIVE   Bilirubin Urine NEGATIVE NEGATIVE   Ketones, ur NEGATIVE NEGATIVE mg/dL   Protein, ur NEGATIVE NEGATIVE mg/dL   Nitrite POSITIVE (A) NEGATIVE   Leukocytes, UA NEGATIVE NEGATIVE  Urine microscopic-add on     Status: Abnormal   Collection Time: 01/03/16 10:32 AM  Result Value Ref Range   Squamous Epithelial / LPF NONE SEEN NONE SEEN   WBC, UA 0-5 0 - 5 WBC/hpf   RBC / HPF NONE SEEN 0 - 5 RBC/hpf   Bacteria, UA MANY (A) NONE SEEN    Studies/Results:   BRAIN MRI FINDINGS: Brain: Negative for acute infarct. Small white matter hyperintensities compatible with mild chronic microvascular ischemia. Negative for hemorrhage or mass lesion. No shift of the midline structures.  Vascular: Normal arterial flow voids.  Skull and upper cervical spine: Thickened skull with a benign appearance.  Sinuses/Orbits: Paranasal sinuses demonstrate mild mucosal edema. Right mastoid sinus effusion. No obstructing mass in the nasopharynx. Bilateral lens replacement. No orbital mass lesion.  Other: None  IMPRESSION: No acute abnormality.  Mild chronic microvascular ischemia.             C-SPINE MRI FINDINGS: Alignment: Slight  degenerative retrolisthesis is present at C3-4.  Vertebrae: There is chronic loss of vertebral body height at C4, C5, and C6 without acute fracture or wedge deformity. Marrow signal is within normal limits.  Cord: Normal signal is present in the cervical and upper thoracic spinal cord to the lowest imaged level, T2-3.  Posterior Fossa, vertebral arteries, paraspinal tissues: The craniocervical junction is within normal limits. Limited imaging of the brain is unremarkable. Flow is present in the vertebral arteries.  Disc levels:  C2-3:  Negative.  C3-4: Moderate right and mild left foraminal narrowing is secondary to uncovertebral and facet hypertrophy. There is partial effacement of the ventral CSF.  C4-5: A broad-based disc osteophyte complex is present. There is partial effacement of ventral CSF. Uncovertebral and facet hypertrophy contribute to moderate foraminal narrowing bilaterally, right greater than left.  C5-6: A rightward disc osteophyte complex partially effaces the ventral CSF. Moderate facet hypertrophy bilaterally contributes to mild bilateral foraminal narrowing.  C6-7: A leftward disc osteophyte complex partially effaces the ventral CSF. Moderate left foraminal narrowing is due to uncovertebral spurring.  C7-T1:  No significant stenosis is present.  IMPRESSION: 1. Multilevel spondylosis of the cervical spine as described above. 2. Moderate right and mild left foraminal narrowing at C3-4. 3. 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 4. Mild central canal narrowing at C3-4, C4-5, and C5-6. 5. Moderate left foraminal  stenosis secondary to uncovertebral spurring and facet hypertrophy at C6-7. 6. Multilevel facet hypertrophy is present.         The brain MRI is reviewed in person. There is no acute lesions seen. There is encephalomalacia involving the left putamen seen on about 3 cuts and consistent with the  previous infarct - chronic infarct.       Aeric Burnham A. Merlene Laughter, M.D.  Diplomate, Tax adviser of Psychiatry and Neurology ( Neurology). 01/03/2016, 7:33 PM

## 2016-01-04 ENCOUNTER — Observation Stay (HOSPITAL_COMMUNITY): Payer: Medicare HMO

## 2016-01-04 ENCOUNTER — Observation Stay (HOSPITAL_BASED_OUTPATIENT_CLINIC_OR_DEPARTMENT_OTHER): Payer: Medicare HMO

## 2016-01-04 DIAGNOSIS — G459 Transient cerebral ischemic attack, unspecified: Secondary | ICD-10-CM | POA: Diagnosis not present

## 2016-01-04 DIAGNOSIS — G453 Amaurosis fugax: Secondary | ICD-10-CM | POA: Diagnosis not present

## 2016-01-04 DIAGNOSIS — R1310 Dysphagia, unspecified: Secondary | ICD-10-CM

## 2016-01-04 LAB — BASIC METABOLIC PANEL
ANION GAP: 7 (ref 5–15)
BUN: 15 mg/dL (ref 6–20)
CO2: 25 mmol/L (ref 22–32)
Calcium: 9.2 mg/dL (ref 8.9–10.3)
Chloride: 107 mmol/L (ref 101–111)
Creatinine, Ser: 0.99 mg/dL (ref 0.44–1.00)
GFR calc Af Amer: >60 mL/min (ref >60)
GFR, EST NON AFRICAN AMERICAN: 56 mL/min — AB (ref >60)
GLUCOSE: 86 mg/dL (ref 65–99)
POTASSIUM: 3.5 mmol/L (ref 3.5–5.1)
Sodium: 139 mmol/L (ref 135–145)

## 2016-01-04 LAB — ECHOCARDIOGRAM COMPLETE
CHL CUP DOP CALC LVOT VTI: 34.8 cm
E decel time: 268 msec
EERAT: 8.51
FS: 41 % (ref 28–44)
Height: 63 in
IVS/LV PW RATIO, ED: 1.04
LA diam end sys: 36 mm
LA diam index: 1.77 cm/m2
LA vol A4C: 33.7 ml
LASIZE: 36 mm
LAVOL: 33 mL
LAVOLIN: 16.2 mL/m2
LV dias vol index: 26 mL/m2
LV dias vol: 52 mL (ref 46–106)
LVEEAVG: 8.51
LVEEMED: 8.51
LVELAT: 7.4 cm/s
LVOT area: 2.84 cm2
LVOT peak grad rest: 9 mmHg
LVOTD: 19 mm
LVOTPV: 149 cm/s
LVOTSV: 99 mL
LVSYSVOL: 17 mL
LVSYSVOLIN: 8 mL/m2
MV Dec: 268
MVPKAVEL: 98.5 m/s
MVPKEVEL: 63 m/s
PW: 13.5 mm — AB (ref 0.6–1.1)
RV LATERAL S' VELOCITY: 13.7 cm/s
Simpson's disk: 68
Stroke v: 35 ml
TAPSE: 24.3 mm
TDI e' lateral: 7.4
TDI e' medial: 4.9
WEIGHTICAEL: 3171.2 [oz_av]

## 2016-01-04 LAB — LIPID PANEL
CHOL/HDL RATIO: 3.9 ratio
Cholesterol: 136 mg/dL (ref 0–200)
HDL: 35 mg/dL — AB (ref >40)
LDL CALC: 78 mg/dL (ref 0–99)
TRIGLYCERIDES: 117 mg/dL (ref <150)
VLDL: 23 mg/dL (ref 0–40)

## 2016-01-04 LAB — MAGNESIUM: Magnesium: 1.5 mg/dL — ABNORMAL LOW (ref 1.7–2.4)

## 2016-01-04 MED ORDER — ACETAMINOPHEN 650 MG RE SUPP
650.0000 mg | Freq: Once | RECTAL | Status: AC
  Administered 2016-01-04: 650 mg via RECTAL
  Filled 2016-01-04: qty 1

## 2016-01-04 MED ORDER — SODIUM CHLORIDE 0.9 % IV SOLN
25.0000 mg | Freq: Once | INTRAVENOUS | Status: AC
  Administered 2016-01-04: 25 mg via INTRAVENOUS
  Filled 2016-01-04: qty 0.5

## 2016-01-04 MED ORDER — KETOROLAC TROMETHAMINE 15 MG/ML IJ SOLN
15.0000 mg | Freq: Once | INTRAMUSCULAR | Status: AC
  Administered 2016-01-04: 15 mg via INTRAVENOUS
  Filled 2016-01-04: qty 1

## 2016-01-04 MED ORDER — ACETAMINOPHEN 500 MG PO TABS
500.0000 mg | ORAL_TABLET | Freq: Four times a day (QID) | ORAL | Status: DC | PRN
  Administered 2016-01-04: 500 mg via ORAL
  Filled 2016-01-04: qty 1

## 2016-01-04 MED ORDER — DIPHENHYDRAMINE HCL 50 MG/ML IJ SOLN
INTRAMUSCULAR | Status: AC
  Filled 2016-01-04: qty 1

## 2016-01-04 MED ORDER — POTASSIUM CHLORIDE ER 20 MEQ PO TBCR: 20.0000 meq | EXTENDED_RELEASE_TABLET | Freq: Every day | ORAL | 0 refills | Status: DC

## 2016-01-04 MED ORDER — METOCLOPRAMIDE HCL 5 MG/ML IJ SOLN
10.0000 mg | Freq: Once | INTRAMUSCULAR | Status: AC
  Administered 2016-01-04: 10 mg via INTRAVENOUS
  Filled 2016-01-04: qty 2

## 2016-01-04 MED ORDER — ASPIRIN 81 MG PO TBEC: 162.0000 mg | DELAYED_RELEASE_TABLET | Freq: Every day | ORAL | 0 refills | Status: DC

## 2016-01-04 MED ORDER — POTASSIUM CHLORIDE CRYS ER 10 MEQ PO TBCR
10.0000 meq | EXTENDED_RELEASE_TABLET | Freq: Every day | ORAL | Status: DC
  Administered 2016-01-04: 10 meq via ORAL
  Filled 2016-01-04: qty 1

## 2016-01-04 NOTE — Progress Notes (Signed)
PT Cancellation Note  Patient Details Name: Connie BlakeDarlean Ponce MRN: [id number] DOB: 1946-03-09   Cancelled Treatment:    Reason Eval/Treat Not Completed: Patient at procedure or test/unavailable (Getting ready to go down for an ultrasound.   Will check back on pt later today. )   Beth Catia Todorov, PT, DPT X: [phone]

## 2016-01-04 NOTE — Discharge Instructions (Signed)
Follow with Primary MD Erasmo DownerStrader, Lindsey F, NP in 7 days   Get CBC, CMP, 2 view Chest X ray checked  by Primary MD or SNF MD in 5-7 days ( we routinely change or add medications that can affect your baseline labs and fluid status, therefore we recommend that you get the mentioned basic workup next visit with your PCP, your PCP may decide not to get them or add new tests based on their clinical decision)   Activity: As tolerated with Full fall precautions use walker/cane & assistance as needed   Disposition Home     Diet:  Soft with feeding assistance and aspiration precautions.  For Heart failure patients - Check your Weight same time everyday, if you gain over 2 pounds, or you develop in leg swelling, experience more shortness of breath or chest pain, call your Primary MD immediately. Follow Cardiac Low Salt Diet and 1.5 lit/day fluid restriction.   On your next visit with your primary care physician please Get Medicines reviewed and adjusted.   Please request your Prim.MD to go over all Hospital Tests and Procedure/Radiological results at the follow up, please get all Hospital records sent to your Prim MD by signing hospital release before you go home.   If you experience worsening of your admission symptoms, develop shortness of breath, life threatening emergency, suicidal or homicidal thoughts you must seek medical attention immediately by calling 911 or calling your MD immediately  if symptoms less severe.  You Must read complete instructions/literature along with all the possible adverse reactions/side effects for all the Medicines you take and that have been prescribed to you. Take any new Medicines after you have completely understood and accpet all the possible adverse reactions/side effects.   Do not drive, operate heavy machinery, perform activities at heights, swimming or participation in water activities or provide baby sitting services if your were admitted for syncope or  siezures until you have seen by Primary MD or a Neurologist and advised to do so again.  Do not drive when taking Pain medications.    Do not take more than prescribed Pain, Sleep and Anxiety Medications  Special Instructions: If you have smoked or chewed Tobacco  in the last 2 yrs please stop smoking, stop any regular Alcohol  and or any Recreational drug use.  Wear Seat belts while driving.   Please note  You were cared for by a hospitalist during your hospital stay. If you have any questions about your discharge medications or the care you received while you were in the hospital after you are discharged, you can call the unit and asked to speak with the hospitalist on call if the hospitalist that took care of you is not available. Once you are discharged, your primary care physician will handle any further medical issues. Please note that NO REFILLS for any discharge medications will be authorized once you are discharged, as it is imperative that you return to your primary care physician (or establish a relationship with a primary care physician if you do not have one) for your aftercare needs so that they can reassess your need for medications and monitor your lab values.

## 2016-01-04 NOTE — Progress Notes (Signed)
*  PRELIMINARY RESULTS* Echocardiogram 2D Echocardiogram has been performed with Definity.  Stacey DrainWhite, Kyria Bumgardner J 01/04/2016, 4:07 PM

## 2016-01-04 NOTE — Procedures (Signed)
Objective Swallowing Evaluation: Type of Study: MBS-Modified Barium Swallow Study  Patient Details  Name: Connie Ponce MRN: [id number] Date of Birth: [birth year]  Today's Date: 01/04/2016 Time: SLP Start Time (ACUTE ONLY): 1215-SLP Stop Time (ACUTE ONLY): 1244 SLP Time Calculation (min) (ACUTE ONLY): 29 min  Past Medical History:  Past Medical History:  Diagnosis Date  . Anxiety   . Carpal tunnel syndrome   . COPD (chronic obstructive pulmonary disease) ([place])   . Gastric ulcer   . GERD (gastroesophageal reflux disease)   . Hyperglycemia   . Hyperlipidemia   . Hypertension   . Hypothyroidism   . Osteoarthritis   . Peptic ulcer disease   . Pneumonia 2010  . Renal insufficiency   . 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 Stroke ([place]) 1990s   mild  . TIA (transient ischemic attack)    Past Surgical History:  Past Surgical History:  Procedure Laterality Date  . ABDOMINAL HYSTERECTOMY    . APPENDECTOMY    . BACK SURGERY    . BONE MARROW ASPIRATION Left 04/22/14  . BONE MARROW BIOPSY Left 04/22/14  . CHOLECYSTECTOMY    . COLONOSCOPY     ?2005, [place]  . COLONOSCOPY N/A 04/07/2015   UUE:KCMKLK  . COLONOSCOPY WITH ESOPHAGOGASTRODUODENOSCOPY (EGD)  02/11/2012   RMR: Ulcerative/erosive reflux esophagitis, benign appearing gastric ulcer with unremarkable biopsy, no H pylori. Colonoscopy was unremarkable. Next colonoscopy in 2023  . CYSTECTOMY     cyst removed from rectal area.   . ESOPHAGOGASTRODUODENOSCOPY  01/05/2003   RMR: Normal esophagus, small hiatal hernia/ A couple of tiny antral erosions, otherwise normal stomach normal D1 and D2.   . ESOPHAGOGASTRODUODENOSCOPY N/A 08/11/2014   JZP:HXTAVWP ulcer/HH  . ESOPHAGOGASTRODUODENOSCOPY N/A 12/01/2014   VXY:[id number] improved gastric ulcerations/p bx  . HEMORRHOID SURGERY    . JOINT REPLACEMENT Right    knee   HPI: The patient is an 70 year old black female who woke up yesterday with numbness and weakness of the right  upper extremity. The chart indicates that she had similar symptoms of the right leg but in questioning the patient she denies any numbness of the right leg. Her primary symptom seems to be numbness of the right upper extremity. She does report some heaviness. Again the legs are not involved. She has had some issues with pain and numbness of the legs but both legs are involving these occur chronically and episodically. She has had some neck pain but does not seems related to the right upper extremity symptoms. She denies chest pain, headaches or shortness of breath. She does not report dysarthria and dysphagia. The patient reports that she did have a mini stroke in the past with facial weakness and right upper extremity weakness. The primary area of concern however was facial weakness. She recovered from this. The review systems is otherwise negative. Neurology note:Likely post-lesional recrudescence from chronic lacunar infarct in the setting of low-grade UTI, acute renal failure and dehydration. No evidence of acute infarct. Physical examination does not support radiculopathy. Chronic left putaminal infarct. This is likely a chronic lacunar infarct. Risk factors hypertension, age and dyslipidemia. The patient aspirin will be increased to 162 mg daily. She currently is nothing by mouth and therefore will give her aspirin rectally for now.  Subjective: "It feels like food gets stuck on the right side of my neck."   Assessment / Plan / Recommendation  CHL IP CLINICAL IMPRESSIONS 01/04/2016  Therapy Diagnosis Mild cervical esophageal phase dysphagia  Clinical Impression Pt  seen upright in hausted chair for MBSS. Oropharyngeal swallow essentially WFL. Pt demonstrated premature spillage with thin liquids both via cup and straw sip with underepiglottic coating, but no penetration deeper into laryngeal vestibule. No pharyngeal residue or pooling noted after the swallow. Pt described globus sensation and pointed to  right sternal area when consuming graham crackers (no objective correlation observed). Pt swallowed barium tablet with thin barium and barium tablet became delayed in distal esophagus near the UES, anterior to an osteophyte near C-7. Pt gagged several times and brought it back up to almost the pyriforms before becoming lodged again in the same spot. Pt given bites of puree in attempt to help push the pill down, however the puree just moved around barium tablet. Pt then given sequential straw swallows of thin water which eventually cleared the barium tablet. Suspect pt's complaints and globus sensation are due to narrowing in distal esophagus. Pt may benefit from GI consult (she says she has an appointment with Dr. Buford Dresser soon).  There does appear to be a presence of an osteophyte at the level where the pill became lodged. Recommend D3/mech soft with thin liquids. Pt educated on soft solids and adding liquids (gravies, sauces, broth) to dry foods to help with esophageal clearance. Pt reports that she already tries to avoid dry solids.   Impact on safety and function Mild aspiration risk      CHL IP TREATMENT RECOMMENDATION 01/04/2016  Treatment Recommendations No treatment recommended at this time     Prognosis 01/04/2016  Prognosis for Safe Diet Advancement Fair  Barriers to Reach Goals (No Data)  Barriers/Prognosis Comment --    CHL IP DIET RECOMMENDATION 01/04/2016  SLP Diet Recommendations Dysphagia 3 (Mech soft) solids;Thin liquid  Liquid Administration via Cup;Straw  Medication Administration Whole meds with liquid  Compensations Follow solids with liquid  Postural Changes Remain semi-upright after after feeds/meals (Comment);Seated upright at 90 degrees      CHL IP OTHER RECOMMENDATIONS 01/04/2016  Recommended Consults Consider GI evaluation;Consider esophageal assessment  Oral Care Recommendations Oral care BID;Patient independent with oral care  Other Recommendations Clarify dietary  restrictions      CHL IP FOLLOW UP RECOMMENDATIONS 01/04/2016  Follow up Recommendations None      No flowsheet data found.         CHL IP ORAL PHASE 01/04/2016  Oral Phase WFL  Oral - Pudding Teaspoon --  Oral - Pudding Cup --  Oral - Honey Teaspoon --  Oral - Honey Cup --  Oral - Nectar Teaspoon --  Oral - Nectar Cup --  Oral - Nectar Straw --  Oral - Thin Teaspoon --  Oral - Thin Cup --  Oral - Thin Straw --  Oral - Puree --  Oral - Mech Soft --  Oral - Regular --  Oral - Multi-Consistency --  Oral - Pill --  Oral Phase - Comment --    CHL IP PHARYNGEAL PHASE 01/04/2016  Pharyngeal Phase WFL  Pharyngeal- Pudding Teaspoon --  Pharyngeal --  Pharyngeal- Pudding Cup --  Pharyngeal --  Pharyngeal- Honey Teaspoon --  Pharyngeal --  Pharyngeal- Honey Cup --  Pharyngeal --  Pharyngeal- Nectar Teaspoon --  Pharyngeal --  Pharyngeal- Nectar Cup --  Pharyngeal --  Pharyngeal- Nectar Straw --  Pharyngeal --  Pharyngeal- Thin Teaspoon --  Pharyngeal --  Pharyngeal- Thin Cup --  Pharyngeal --  Pharyngeal- Thin Straw --  Pharyngeal --  Pharyngeal- Puree --  Pharyngeal --  Pharyngeal- Mechanical  Soft --  Pharyngeal --  Pharyngeal- Regular --  Pharyngeal --  Pharyngeal- Multi-consistency --  Pharyngeal --  Pharyngeal- Pill --  Pharyngeal --  Pharyngeal Comment --     CHL IP CERVICAL ESOPHAGEAL PHASE 01/04/2016  Cervical Esophageal Phase Impaired  Pudding Teaspoon --  Pudding Cup --  Honey Teaspoon --  Honey Cup --  Nectar Teaspoon --  Nectar Cup --  Nectar Straw --  Thin Teaspoon --  Thin Cup --  Thin Straw --  Puree --  Mechanical Soft --  Regular --  Multi-consistency --  Pill Other (Comment)  Cervical Esophageal Comment Barium tablet became lodged in proximal esophagus just below UES    CHL IP GO 01/04/2016  Functional Assessment Tool Used clinical judgment  Functional Limitations Swallowing  Swallow Current Status (T2458) CJ  Swallow  Goal Status (K9983) CI  Swallow Discharge Status (J8250) CI  Motor Speech Current Status (N3976) (None)  Motor Speech Goal Status (B3419) (None)  Motor Speech Goal Status (F7902) (None)  Spoken Language Comprehension Current Status (I0973) (None)  Spoken Language Comprehension Goal Status (Z3299) (None)  Spoken Language Comprehension Discharge Status (M4268) (None)  Spoken Language Expression Current Status (T4196) (None)  Spoken Language Expression Goal Status (Q2297) (None)  Spoken Language Expression Discharge Status [phone]) (None)  Attention Current Status (J9417) (None)  Attention Goal Status (E0814) (None)  Attention Discharge Status (G8185) (None)  Memory Current Status (U3149) (None)  Memory Goal Status (F0263) (None)  Memory Discharge Status (Z8588) (None)  Voice Current Status (F0277) (None)  Voice Goal Status (A1287) (None)  Voice Discharge Status (O6767) (None)  Other Speech-Language Pathology Functional Limitation (M0947) (None)  Other Speech-Language Pathology Functional Limitation Goal Status (S9628) (None)  Other Speech-Language Pathology Functional Limitation Discharge Status [phone]) (None)   Thank you,  Genene Churn, [place]  [place] 01/04/2016, 2:11 PM

## 2016-01-04 NOTE — Evaluation (Signed)
Clinical/Bedside Swallow Evaluation Patient Details  Name: Connie Ponce MRN: [id number] Date of Birth: November 15, 1945  Today's Date: 01/04/2016 Time: SLP Start Time (ACUTE ONLY): 1100 SLP Stop Time (ACUTE ONLY): 1127 SLP Time Calculation (min) (ACUTE ONLY): 27 min  Past Medical History:  Past Medical History:  Diagnosis Date  . Anxiety   . Carpal tunnel syndrome   . COPD (chronic obstructive pulmonary disease) ([place])   . Gastric ulcer   . GERD (gastroesophageal reflux disease)   . Hyperglycemia   . Hyperlipidemia   . Hypertension   . Hypothyroidism   . Osteoarthritis   . Peptic ulcer disease   . Pneumonia 2010  . Renal insufficiency   . 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 Stroke ([place]) 1990s   mild  . TIA (transient ischemic attack)    Past Surgical History:  Past Surgical History:  Procedure Laterality Date  . ABDOMINAL HYSTERECTOMY    . APPENDECTOMY    . BACK SURGERY    . BONE MARROW ASPIRATION Left 04/22/14  . BONE MARROW BIOPSY Left 04/22/14  . CHOLECYSTECTOMY    . COLONOSCOPY     ?2005, [place]  . COLONOSCOPY N/A 04/07/2015   GEZ:MOQHUT  . COLONOSCOPY WITH ESOPHAGOGASTRODUODENOSCOPY (EGD)  02/11/2012   RMR: Ulcerative/erosive reflux esophagitis, benign appearing gastric ulcer with unremarkable biopsy, no H pylori. Colonoscopy was unremarkable. Next colonoscopy in 2023  . CYSTECTOMY     cyst removed from rectal area.   . ESOPHAGOGASTRODUODENOSCOPY  01/05/2003   RMR: Normal esophagus, small hiatal hernia/ A couple of tiny antral erosions, otherwise normal stomach normal D1 and D2.   . ESOPHAGOGASTRODUODENOSCOPY N/A 08/11/2014   MLY:YTKPTWS ulcer/HH  . ESOPHAGOGASTRODUODENOSCOPY N/A 12/01/2014   FKC:[id number] improved gastric ulcerations/p bx  . HEMORRHOID SURGERY    . JOINT REPLACEMENT Right    knee   HPI:  The patient is an 70 year old black female who woke up yesterday with numbness and weakness of the right upper extremity. The chart indicates that she  had similar symptoms of the right leg but in questioning the patient she denies any numbness of the right leg. Her primary symptom seems to be numbness of the right upper extremity. She does report some heaviness. Again the legs are not involved. She has had some issues with pain and numbness of the legs but both legs are involving these occur chronically and episodically. She has had some neck pain but does not seems related to the right upper extremity symptoms. She denies chest pain, headaches or shortness of breath. She does not report dysarthria and dysphagia. The patient reports that she did have a mini stroke in the past with facial weakness and right upper extremity weakness. The primary area of concern however was facial weakness. She recovered from this. The review systems is otherwise negative. Neurology note:Likely post-lesional recrudescence from chronic lacunar infarct in the setting of low-grade UTI, acute renal failure and dehydration. No evidence of acute infarct. Physical examination does not support radiculopathy. Chronic left putaminal infarct. This is likely a chronic lacunar infarct. Risk factors hypertension, age and dyslipidemia. The patient aspirin will be increased to 162 mg daily. She currently is nothing by mouth and therefore will give her aspirin rectally for now.   MR Cervical Spine:  IMPRESSION: 1. Multilevel spondylosis of the cervical spine as described above. 2. Moderate right and mild left foraminal narrowing at C3-4. 3. 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 4. Mild central canal narrowing at C3-4,  C4-5, and C5-6. 5. Moderate left foraminal stenosis secondary to uncovertebral spurring and facet hypertrophy at C6-7. 6. Multilevel facet hypertrophy is present.  Assessment / Plan / Recommendation Clinical Impression  Oral swallow appears WFL, however pt with immediate and delayed strong coughing with cracker presentation and pt with  report of pharyngeal globus sensation. Pt tells SLP that this has been going on for a few weeks (difficulty swallowing solid foods). MR Cervical Spine completed and images reviewed. Pt appears to have osteophytes along C4-5 and C6-7 which could be causing some of patient's symptoms. Recommend MBSS to objectively evaluate swallow and identify appropriate diet and strategies as indicated. MD in agreement with plan of care.     Aspiration Risk  Mild aspiration risk    Diet Recommendation Dysphagia 3 (Mech soft);Thin liquid   Liquid Administration via: Cup Medication Administration: Whole meds with liquid Supervision: Patient able to self feed Compensations: Multiple dry swallows after each bite/sip;Follow solids with liquid Postural Changes: Seated upright at 90 degrees;Remain upright for at least 30 minutes after po intake    Other  Recommendations Oral Care Recommendations: Oral care BID;Patient independent with oral care Other Recommendations: Clarify dietary restrictions   Follow up Recommendations None      Frequency and Duration            Prognosis Prognosis for Safe Diet Advancement: Fair Barriers to Reach Goals:  (suspect osteophyte impinging upon pharyngeal space)      Swallow Study   General Date of Onset: 01/03/16 HPI: The patient is an 70 year old black female who woke up yesterday with numbness and weakness of the right upper extremity. The chart indicates that she had similar symptoms of the right leg but in questioning the patient she denies any numbness of the right leg. Her primary symptom seems to be numbness of the right upper extremity. She does report some heaviness. Again the legs are not involved. She has had some issues with pain and numbness of the legs but both legs are involving these occur chronically and episodically. She has had some neck pain but does not seems related to the right upper extremity symptoms. She denies chest pain, headaches or shortness of  breath. She does not report dysarthria and dysphagia. The patient reports that she did have a mini stroke in the past with facial weakness and right upper extremity weakness. The primary area of concern however was facial weakness. She recovered from this. The review systems is otherwise negative. Neurology note:Likely post-lesional recrudescence from chronic lacunar infarct in the setting of low-grade UTI, acute renal failure and dehydration. No evidence of acute infarct. Physical examination does not support radiculopathy. Chronic left putaminal infarct. This is likely a chronic lacunar infarct. Risk factors hypertension, age and dyslipidemia. The patient aspirin will be increased to 162 mg daily. She currently is nothing by mouth and therefore will give her aspirin rectally for now. Type of Study: Bedside Swallow Evaluation Previous Swallow Assessment: EGD Sept 2016 Diet Prior to this Study: NPO Temperature Spikes Noted: No Respiratory Status: Room air History of Recent Intubation: No Behavior/Cognition: Alert;Cooperative;Pleasant mood Oral Cavity Assessment: Within Functional Limits Oral Care Completed by SLP: Recent completion by staff Oral Cavity - Dentition: Adequate natural dentition Vision: Functional for self-feeding Self-Feeding Abilities: Able to feed self Patient Positioning: Upright in chair Baseline Vocal Quality: Normal Volitional Cough: Strong Volitional Swallow: Able to elicit    Oral/Motor/Sensory Function Overall Oral Motor/Sensory Function: Within functional limits   Ice Chips Ice chips: Within functional  limits Presentation: Spoon   Thin Liquid Thin Liquid: Within functional limits Presentation: Cup;Self Fed;Straw    Nectar Thick Nectar Thick Liquid: Not tested   Honey Thick Honey Thick Liquid: Not tested   Puree Puree: Within functional limits Presentation: Spoon   Solid   GO   Solid: Impaired Presentation: Self Fed Pharyngeal Phase Impairments: Cough -  Immediate;Cough - Delayed;Multiple swallows    Functional Assessment Tool Used: clinical judgment Functional Limitations: Swallowing Swallow Current Status (U9437): At least 20 percent but less than 40 percent impaired, limited or restricted Swallow Goal Status [phone]): At least 1 percent but less than 20 percent impaired, limited or restricted   Connie Ponce 01/04/2016,11:34 AM

## 2016-01-04 NOTE — Evaluation (Signed)
Physical Therapy Evaluation Patient Details Name: Connie Ponce MRN: [id number] DOB: January 10, 1946 Today's Date: 01/04/2016   History of Present Illness  Connie Ponce  is a 70 y.o. female, Straight of TIA, hypertension, GERD, dyslipidemia, COPD, carpal tunnel, DJD, GERD who comes to the hospital with 12 hour history of some right arm and leg numbness and weakness which started this morning when she woke up, she also has mild neck pain which is constant, nonradiating, no aggravating or relieving factors and present for at least one day. She also says that for the last 2 weeks she has noticed some problems swallowing regular food and it feels like it sticks to her throat. No history seizures to aspiration. She has some chronic right leg weakness but that has not changed.   C-Spine CT:  IMPRESSION: 1. Multilevel spondylosis of the cervical spine as described above. 2. Moderate right and mild left foraminal narrowing at C3-4. 3. 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 4. Mild central canal narrowing at C3-4, C4-5, and C5-6. 5. Moderate left foraminal stenosis secondary to uncovertebral spurring and facet hypertrophy at C6-7. 6. Multilevel facet hypertrophy is present.   Clinical Impression  Pt received sitting on the EOB, and was agreeable to PT evaluation.  Pt states she normally does not use any DME for ambulation, and is independent with ADL's.  Pt states that she has had no falls, but has had multiple close calls.  She also expressed that she has limited her community ambulation due to feeling unsteady - and now her husband has been doing most of the grocery shopping and running errands.  She states that she still has numbness in the R UE, and occasionally has numbness in B feet.  Pt demonstrated decreased balance during gait assessment, and required a RW for stability.  She also demonstrates decrease in gait speed, a 5x sit<>stand.  All of these findings place pt at  a high risk for falls.  She is recommended to f/u with OPPT for continued strength, and balance to reduce her risk of falling.      Follow Up Recommendations Outpatient PT    Equipment Recommendations  Rolling walker with 5" wheels (Pt states she would be able to borrow a RW from a friend/family member and have it available upon d/c. )    Recommendations for Other Services       Precautions / Restrictions Precautions Precautions: None Precaution Comments: However, pt reports several close calls, and she has limited her community mobility due to being unsteady on her feet.  Restrictions Weight Bearing Restrictions: No      Mobility  Bed Mobility Overal bed mobility: Modified Independent             General bed mobility comments: sit<>sidelying.   Transfers Overall transfer level: Modified independent Equipment used: None             General transfer comment: 5x sit<>stand 24.92 seconds with use of B UE's and fatigue by the end.    Ambulation/Gait Ambulation/Gait assistance: Min assist Ambulation Distance (Feet): 80 Feet Assistive device: Rolling walker (2 wheeled) Gait Pattern/deviations: Step-through pattern;Trendelenburg;Antalgic   Gait velocity interpretation: <1.8 ft/sec, indicative of risk for recurrent falls General Gait Details: Attempted ambulation with no AD, however pt demonstrates significant balance deficits - reaching out for objects to hold on to on both the right and the left.  Pt given RW to continue ambulation.  Pt demonstrates decreased gait speed.    Stairs  Wheelchair Mobility    Modified Rankin (Stroke Patients Only)       Balance Overall balance assessment: Needs assistance Sitting-balance support: No upper extremity supported;Feet supported Sitting balance-Leahy Scale: Good     Standing balance support: Bilateral upper extremity supported Standing balance-Leahy Scale: Fair                                Pertinent Vitals/Pain Pain Assessment: 0-10 Pain Score: 8  Pain Location: head  Pain Descriptors / Indicators: Constant;Throbbing Pain Intervention(s): Limited activity within patient's tolerance;Monitored during session;Repositioned    Home Living Family/patient expects to be discharged to:: Private residence Living Arrangements: Spouse/significant other Available Help at Discharge: Family;Available PRN/intermittently Type of Home: Mobile home Home Access: Stairs to enter   Entrance Stairs-Number of Steps: 3 steps  Home Layout: One level Home Equipment: None      Prior Function Level of Independence: Independent   Gait / Transfers Assistance Needed: independent  ADL's / Homemaking Assistance Needed: independent.   Comments: Pt states that husband mostly went out to run errands, but she was still able to do that sometimes.  Pt states that her balance was limiting her from doing those things.       Hand Dominance   Dominant Hand: Right    Extremity/Trunk Assessment   Upper Extremity Assessment: Defer to OT evaluation RUE Deficits / Details: (P) R UE numbness/tingling all the way to fingertips.     RUE Sensation: decreased light touch     Lower Extremity Assessment: (P) RLE deficits/detail;LLE deficits/detail RLE Deficits / Details: pt reported that she experienced numbness in her feet when she woke up this morning.  hip flexion: 3/5, knee extension3+/5, and ankle DF: 3/5 - pt quickly breaks with overpressure.  LLE Deficits / Details: pt reported that she experienced numbness in her feet when she woke up this morning. hip flexion: 4/5, knee extension: 4/5, and ankle DF: 4/5     Communication   Communication: No difficulties  Cognition Arousal/Alertness: Awake/alert Behavior During Therapy: WFL for tasks assessed/performed Overall Cognitive Status: Within Functional Limits for tasks assessed                      General Comments      Exercises      Assessment/Plan    PT Assessment All further PT needs can be met in the next venue of care  PT Problem List Decreased strength;Decreased activity tolerance;Decreased balance;Decreased mobility;Decreased coordination;Decreased knowledge of use of DME;Decreased safety awareness;Decreased knowledge of precautions;Pain;Obesity          PT Treatment Interventions DME instruction;Gait training;Functional mobility training;Therapeutic activities;Therapeutic exercise;Balance training;Patient/family education    PT Goals (Current goals can be found in the Care Plan section)  Acute Rehab PT Goals Patient Stated Goal: Pt wants to go home PT Goal Formulation: With patient Time For Goal Achievement: 01/11/16 Potential to Achieve Goals: Good    Frequency     Barriers to discharge        Co-evaluation               End of Session Equipment Utilized During Treatment: Gait belt Activity Tolerance: Patient tolerated treatment well Patient left: in bed Nurse Communication: Mobility status (Notified Dr. Candiss Norse, as well as Case Management regarding d/c needs. )    Functional Assessment Tool Used: Edgar "6-clicks"  Functional Limitation: Mobility: Walking and moving around Mobility: Walking and Moving  Around Current Status (Q4920): At least 1 percent but less than 20 percent impaired, limited or restricted Mobility: Walking and Moving Around Goal Status [phone]): At least 1 percent but less than 20 percent impaired, limited or restricted Mobility: Walking and Moving Around Discharge Status [phone]): At least 1 percent but less than 20 percent impaired, limited or restricted    Time: 8832-5498 PT Time Calculation (min) (ACUTE ONLY): 19 min   Charges:   PT Evaluation $PT Eval Low Complexity: 1 Procedure     PT G Codes:   PT G-Codes **NOT FOR INPATIENT CLASS** Functional Assessment Tool Used: [employer] "6-clicks"  Functional Limitation: Mobility:  Walking and moving around Mobility: Walking and Moving Around Current Status [phone]): At least 1 percent but less than 20 percent impaired, limited or restricted Mobility: Walking and Moving Around Goal Status [phone]): At least 1 percent but less than 20 percent impaired, limited or restricted Mobility: Walking and Moving Around Discharge Status [phone]): At least 1 percent but less than 20 percent impaired, limited or restricted    Beth Jaymari Cromie, PT, DPT X: [phone]

## 2016-01-04 NOTE — Evaluation (Signed)
Occupational Therapy Evaluation Patient Details Name: Connie BlakeDarlean Ponce MRN: [id number] DOB: 1945/05/31 Today's Date: 01/04/2016    History of Present Illness Connie Ponce  is a 70 y.o. female, Straight of TIA, hypertension, GERD, dyslipidemia, COPD, carpal tunnel, DJD, GERD who comes to the hospital with 12 hour history of some right arm and leg numbness and weakness which started this morning when she woke up, she also has mild neck pain which is constant, nonradiating, no aggravating or relieving factors and present for at least one day. She also says that for the last 2 weeks she has noticed some problems swallowing regular food and it feels like it sticks to her throat. No history seizures to aspiration. She has some chronic right leg weakness but that has not changed.    Clinical Impression   Pt awake, alert, oriented x4 this am, agreeable to OT evaluation. Pt reports tingling in RUE, stating "it feels like it's trying to wake up." Upon sensation testing, pt has decreased sensation to light touch along volar forearm up to elbow, palm, and thumb. Pt performing ADLs during evaluation with supervision, appears to be at baseline with ADL performance and functional mobility. Pt does report continued weakness and fatigue in RUE. Recommend outpatient OT services post-discharge to focus on improving RUE strength, including grip strength, and activity tolerance to promote highest level of functioning using RUE as dominant. Discussed with pt who reports she would be interested if her strength does not return quickly. No further acute care needs at this time.     Follow Up Recommendations  Outpatient OT (if pt interested post-discharge)    Equipment Recommendations  None recommended by OT       Precautions / Restrictions Precautions Precautions: None Restrictions Weight Bearing Restrictions: No      Mobility Bed Mobility Overal bed mobility: Modified Independent                 Transfers Overall transfer level: Modified independent Equipment used: None                       ADL Overall ADL's : Needs assistance/impaired     Grooming: Wash/dry hands;Modified independent;Standing           Upper Body Dressing : Minimal assistance;Sitting Upper Body Dressing Details (indicate cue type and reason): Min assist required for managing buttons and ties on gown Lower Body Dressing: Supervision/safety;Sit to/from stand   Toilet Transfer: Supervision/safety;Grab bars   Toileting- ArchitectClothing Manipulation and Hygiene: Supervision/safety;Sit to/from stand Toileting - Clothing Manipulation Details (indicate cue type and reason): Pt able to stand and perform peri-hygiene as well as manage clothing with no DME, no LOB     Functional mobility during ADLs: Supervision/safety       Vision Vision Assessment?: No apparent visual deficits          Pertinent Vitals/Pain Pain Assessment: No/denies pain     Hand Dominance Right   Extremity/Trunk Assessment Upper Extremity Assessment Upper Extremity Assessment: RUE deficits/detail RUE Deficits / Details: RUE strength at 4-/5 throughout, grip strength decreased as well. Pt with decreased sensation on volar forearm, thumb, and palm RUE Sensation: decreased light touch   Lower Extremity Assessment Lower Extremity Assessment: Defer to PT evaluation       Communication Communication Communication: No difficulties   Cognition Arousal/Alertness: Awake/alert Behavior During Therapy: WFL for tasks assessed/performed Overall Cognitive Status: Within Functional Limits for tasks assessed  Home Living Family/patient expects to be discharged to:: Private residence Living Arrangements: Spouse/significant other Available Help at Discharge: Family;Available PRN/intermittently               Bathroom Shower/Tub: Chief Strategy Officer: Standard      Home Equipment: None          Prior Functioning/Environment Level of Independence: Independent        Comments: Pt reports independence in ADL and functional mobility, as well as community mobility        OT Problem List: Decreased strength;Decreased coordination    End of Session Equipment Utilized During Treatment: Gait belt  Activity Tolerance: Patient tolerated treatment well Patient left: Other (comment) (with transport)   Time: 8119-1478 OT Time Calculation (min): 29 min Charges:  OT General Charges $OT Visit: 1 Procedure OT Evaluation $OT Eval Low Complexity: 1 Procedure G-Codes: OT G-codes **NOT FOR INPATIENT CLASS** Functional Assessment Tool Used: clinical judgement Functional Limitation: Self care Self Care Current Status (G9562): At least 1 percent but less than 20 percent impaired, limited or restricted Self Care Goal Status (Z3086): At least 1 percent but less than 20 percent impaired, limited or restricted Self Care Discharge Status [phone]): At least 1 percent but less than 20 percent impaired, limited or restricted   Ezra Sites, OTR/L  [phone] 01/04/2016, 10:27 AM

## 2016-01-04 NOTE — Care Management Obs Status (Signed)
MEDICARE OBSERVATION STATUS NOTIFICATION   Patient Details  Name: Connie BlakeDarlean Ponce MRN: [id number] Date of Birth: 08-02-1945   Medicare Observation Status Notification Given:  Yes    Braxtyn Dorff, Chrystine OilerSharley Diane, RN 01/04/2016, 1:25 PM

## 2016-01-04 NOTE — Progress Notes (Signed)
Discharged PT per MD order and protocol. Discharge handouts reviewed/explained. Education completed.  Pt verbalized understanding and left with all belongings. VSS. IV catheter D/C.  Patient wheeled down by staff member.  

## 2016-01-04 NOTE — Discharge Summary (Signed)
Jasper Riling QQP:[id number] DOB: [birth year] DOA: 01/03/2016  PCP: [name], NP  Admit date: 01/03/2016  Discharge date: 01/04/2016  Admitted From: Home   Disposition:  Home   Recommendations for Outpatient Follow-up:   Follow up with PCP in 1-2 weeks  PCP Please obtain BMP/CBC, 2 view CXR in 1week,  (see Discharge instructions)   PCP Please follow up on the following pending results: Pending A1c, monitor LDL closely, needs outpatient neurosurgery and GI follow-up   Home Health: Refused HHPT   Equipment/Devices: Walker  Consultations: Neuro  Discharge Condition: Stable   CODE STATUS: Full   Diet Recommendation: Heart Healthy    Chief Complaint  Patient presents with  . Numbness     Brief history of present illness from the day of admission and additional interim summary    Connie Ponce  is a 70 y.o. female, Straight of TIA, hypertension, GERD, dyslipidemia, COPD, carpal tunnel, DJD, GERD who comes to the hospital with 12 hour history of some right arm and leg numbness and weakness which started this morning when she woke up, she also has mild neck pain which is constant, nonradiating, no aggravating or relieving factors and present for at least one day. She also says that for the last 2 weeks she has noticed some problems swallowing regular food and it feels like it sticks to her throat. No history seizures to aspiration. She has some chronic right leg weakness but that has not changed. No other subjective complaints.  In the ER MRI brain unremarkable, I was called to admit the patient for TIA workup  Hospital issues addressed      1. C-spine nerve impingement versus TIA/Unmasking of previous stroke with right arm weakness. 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 For now aspirin dose will be doubled from 81-162 mg based on neuro recommendation, continue statin for secondary workup, monitor secondary to factors for stroke and TIA in the outpatient setting. He has a pending A1c which needs to be followed. Stable TTE and Carotid US.   2. Dehydration with ARF. Resolved with hydration, PCP to monitor BMP in the outpatient setting.  3. Hypokalemia. Replace.  4. Dyslipidemia. Continue statin LDL 78, her PCP to monitor and get the LDL to under 70. Possible.  5. HTN - for now continue Diltiazem.  6. GERD - Zantac continued.  7. Dysphagia - on going for weeks, and with modified barium swallow which is a suggestive of possible esophageal narrowing, for diet, she refused home speech and PT. Outpatient follow-up with GI for possible EGD.  8. 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 Outpatient risk factor stratification and age-appropriate workup and management by PCP.   9. Hypothyroidism. Continue Synthroid stable TSH.  Lab Results  Component Value Date  HGBA1C 6.0 01/17/2012   Lab Results  Component Value Date   CHOL 136 01/04/2016   HDL 35 (L) 01/04/2016   LDLCALC 78 01/04/2016   TRIG 117 01/04/2016   CHOLHDL 3.9 01/04/2016        Discharge diagnosis     Principal Problem:   TIA (transient ischemic attack) Active Problems:   GERD (gastroesophageal reflux disease)   ARF (acute renal failure) (HCC)   Absolute anemia   Dysphagia, oropharyngeal phase    Discharge instructions    Discharge Instructions    Diet - low sodium heart healthy    Complete by:  As directed    Discharge instructions    Complete by:  As directed    Follow with Primary MD [name], NP  in 7 days   Get CBC, CMP, 2 view Chest X ray checked  by Primary MD or SNF MD in 5-7 days ( we routinely change or add medications that can affect your baseline labs and fluid status, therefore we recommend that you get the mentioned basic workup next visit with your PCP, your PCP may decide not to get them or add new tests based on their clinical decision)   Activity: As tolerated with Full fall precautions use walker/cane & assistance as needed   Disposition Home     Diet:  Soft with feeding assistance and aspiration precautions.  For Heart failure patients - Check your Weight same time everyday, if you gain over 2 pounds, or you develop in leg swelling, experience more shortness of breath or chest pain, call your Primary MD immediately. Follow Cardiac Low Salt Diet and 1.5 lit/day fluid restriction.   On your next visit with your primary care physician please Get Medicines reviewed and adjusted.   Please request your Prim.MD to go over all Hospital Tests and Procedure/Radiological results at the follow up, please get all Hospital records sent to your Prim MD by signing hospital release before you go home.   If you experience worsening of your admission symptoms, develop shortness of breath, life threatening emergency, suicidal or homicidal thoughts you must seek medical attention immediately by calling 911 or calling your MD immediately  if symptoms less severe.  You Must read complete instructions/literature along with all the possible adverse reactions/side effects for all the Medicines you take and that have been prescribed to you. Take any new Medicines after you have completely understood and accpet all the possible adverse reactions/side effects.   Do not drive, operate heavy machinery, perform activities at heights, swimming or participation in water activities or provide baby sitting services if your were admitted for syncope or siezures until you have seen by Primary MD or a  Neurologist and advised to do so again.  Do not drive when taking Pain medications.    Do not take more than prescribed Pain, Sleep and Anxiety Medications  Special Instructions: If you have smoked or chewed Tobacco  in the last 2 yrs please stop smoking, stop any regular Alcohol  and or any Recreational drug use.  Wear Seat belts while driving.   Please note  You were cared for by a hospitalist during your hospital stay. If you have any questions about your discharge medications or the care you received while you were in the hospital after you are discharged, you can call the unit and asked to speak with the hospitalist on call if the hospitalist that took care of you is not available. Once you are discharged, your primary  care physician will handle any further medical issues. Please note that NO REFILLS for any discharge medications will be authorized once you are discharged, as it is imperative that you return to your primary care physician (or establish a relationship with a primary care physician if you do not have one) for your aftercare needs so that they can reassess your need for medications and monitor your lab values.   Increase activity slowly    Complete by:  As directed       Discharge Medications     Medication List    TAKE these medications   aspirin 81 MG EC tablet Take 2 tablets (162 mg total) by mouth daily. Start taking on:  01/05/2016 What changed:  how much to take   budesonide-formoterol 160-4.5 MCG/ACT inhaler Commonly known as:  SYMBICORT Inhale 2 puffs into the lungs 2 (two) times daily as needed (shortness of breath).   DEXILANT 60 MG capsule Generic drug:  dexlansoprazole TAKE (1) CAPSULE BY MOUTH ONCE DAILY.   diltiazem 360 MG 24 hr capsule Commonly known as:  TIAZAC Take 360 mg by mouth daily.   furosemide 20 MG tablet Commonly known as:  LASIX Take 40 mg by mouth daily.   Gerhardt's butt cream Crea Apply 1 application topically 3 (three)  times daily. What changed:  when to take this  reasons to take this   imipramine 25 MG tablet Commonly known as:  TOFRANIL Take 25 mg by mouth at bedtime.   levothyroxine 125 MCG tablet Commonly known as:  SYNTHROID, LEVOTHROID Take 125 mcg by mouth daily before breakfast.   lubiprostone 24 MCG capsule Commonly known as:  AMITIZA Take 24 mcg by mouth 2 (two) times daily with a meal.   meclizine 25 MG tablet Commonly known as:  ANTIVERT Take 25 mg by mouth as needed for dizziness. Reported on 10/06/2015   oxybutynin 5 MG tablet Commonly known as:  DITROPAN Take 1 tablet (5 mg total) by mouth 3 (three) times daily.   oxyCODONE-acetaminophen 5-325 MG tablet Commonly known as:  PERCOCET/ROXICET 1 tablet every 6 (six) hours as needed for moderate pain or severe pain.   Potassium Chloride ER 20 MEQ Tbcr Take 20 mEq by mouth daily. What changed:  medication strength  how much to take   pravastatin 40 MG tablet Commonly known as:  PRAVACHOL Take 40 mg by mouth daily.   ranitidine 150 MG tablet Commonly known as:  ZANTAC Take 150 mg by mouth at bedtime.   solifenacin 10 MG tablet Commonly known as:  VESICARE Take 10 mg by mouth daily.   traZODone 150 MG tablet Commonly known as:  DESYREL Take 300 mg by mouth at bedtime.   triamterene-hydrochlorothiazide 37.5-25 MG tablet Commonly known as:  MAXZIDE-25 Take 1 tablet by mouth daily.   Vitamin D-3 5000 UNITS Tabs Take 1 tablet by mouth daily.       Follow-up Information    [name], NP. Schedule an appointment as soon as possible for a visit in 1 week(s).   Specialty:  Nurse Practitioner Contact information: [address] [place]  [address] [phone]        [facility], Trey Sailors, MD. Schedule an appointment as soon as possible for a visit in 1 week(s).   Specialty:  Neurology Contact information: [address] [address] [phone]        Consuella Lose, C, MD.  Schedule an appointment as soon as possible for a visit in 1 week(s).  Specialty:  Neurosurgery Why:  C spine DJD Contact information: [address] [place] [address] [phone]           Major procedures and Radiology Reports - PLEASE review detailed and final reports thoroughly  -     TTE  Left ventricle: The cavity size was normal. Wall thickness was  increased in a pattern of moderate LVH. Systolic function was vigorous. The estimated ejection fraction was in the range of 65%  to 70%. Wall motion was normal; there were no regional wall  motion abnormalities. Doppler parameters are consistent with abnormal left ventricular relaxation (grade 1 diastolic dysfunction). - Mitral valve: Calcified annulus. - Atrial septum: No defect or patent foramen ovale was identified.   Dg Chest 2 View  Result Date: 01/03/2016 CLINICAL DATA:  Shortness of breath for several months EXAM: CHEST  2 VIEW COMPARISON:  09/19/2014 FINDINGS: Cardiac shadow is within normal limits. The lungs are hyperinflated consistent with COPD. No focal infiltrate or sizable effusion is seen. Stable degenerative change of the thoracic spine is noted. The aortic calcifications are noted. IMPRESSION: COPD without acute abnormality. Electronically Signed   By: Inez Catalina M.D.   On: 01/03/2016 11:16   Ct Head Wo Contrast  Result Date: 01/03/2016 CLINICAL DATA:  Right arm numbness.  Unsteady gait. EXAM: CT HEAD WITHOUT CONTRAST TECHNIQUE: Contiguous axial images were obtained from the base of the skull through the vertex without intravenous contrast. COMPARISON:  CT scan of Jul 31, 2007. FINDINGS: Brain: No mass effect or midline shift is noted. Ventricular size is within normal limits. There is no evidence of mass lesion, hemorrhage or acute infarction. Vascular: No abnormality seen. Skull: Bony calvarium appears normal. Sinuses/Orbits: Paranasal sinuses are unremarkable. Other: None. IMPRESSION:  Normal head CT. These results were called by telephone at the time of interpretation on 01/03/2016 at 11:04 am to Dr. Francine Graven , who verbally acknowledged these results. Electronically Signed   By: Marijo Conception, M.D.   On: 01/03/2016 11:05   Mr Brain Wo Contrast (neuro Protocol)  Result Date: 01/03/2016 CLINICAL DATA:  Right arm numbness. EXAM: MRI HEAD WITHOUT CONTRAST TECHNIQUE: Multiplanar, multiecho pulse sequences of the brain and surrounding structures were obtained without intravenous contrast. COMPARISON:  CT head 01/03/2016 FINDINGS: Brain: Negative for acute infarct. Small white matter hyperintensities compatible with mild chronic microvascular ischemia. Negative for hemorrhage or mass lesion. No shift of the midline structures. Vascular: Normal arterial flow voids. Skull and upper cervical spine: Thickened skull with a benign appearance. Sinuses/Orbits: Paranasal sinuses demonstrate mild mucosal edema. Right mastoid sinus effusion. No obstructing mass in the nasopharynx. Bilateral lens replacement. No orbital mass lesion. Other: None IMPRESSION: No acute abnormality.  Mild chronic microvascular ischemia. Electronically Signed   By: Franchot Gallo M.D.   On: 01/03/2016 11:40   Mr Cervical Spine Wo Contrast  Result Date: 01/03/2016 CLINICAL DATA:  Right arm numbness for 1 day. EXAM: MRI CERVICAL SPINE WITHOUT CONTRAST TECHNIQUE: Multiplanar, multisequence MR imaging of the cervical spine was performed. No intravenous contrast was administered. COMPARISON:  MRI brain without contrast from the same day. FINDINGS: Alignment: Slight degenerative retrolisthesis is present at C3-4. Vertebrae: There is chronic loss of vertebral body height at C4, C5, and C6 without acute fracture or wedge deformity. Marrow signal is within normal limits. Cord: Normal signal is present in the cervical and upper thoracic spinal cord to the lowest imaged level, T2-3. Posterior Fossa, vertebral arteries,  paraspinal tissues: The craniocervical  junction is within normal limits. Limited imaging of the brain is unremarkable. Flow is present in the vertebral arteries. Disc levels: C2-3:  Negative. C3-4: Moderate right and mild left foraminal narrowing is secondary to uncovertebral and facet hypertrophy. There is partial effacement of the ventral CSF. C4-5: A broad-based disc osteophyte complex is present. There is partial effacement of ventral CSF. Uncovertebral and facet hypertrophy contribute to moderate foraminal narrowing bilaterally, right greater than left. C5-6: A rightward disc osteophyte complex partially effaces the ventral CSF. Moderate facet hypertrophy bilaterally contributes to mild bilateral foraminal narrowing. C6-7: A leftward disc osteophyte complex partially effaces the ventral CSF. Moderate left foraminal narrowing is due to uncovertebral spurring. C7-T1:  No significant stenosis is present. IMPRESSION: 1. Multilevel spondylosis of the cervical spine as described above. 2. Moderate right and mild left foraminal narrowing at C3-4. 3. 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 4. Mild central canal narrowing at C3-4, C4-5, and C5-6. 5. Moderate left foraminal stenosis secondary to uncovertebral spurring and facet hypertrophy at C6-7. 6. Multilevel facet hypertrophy is present. Electronically Signed   By: San Morelle M.D.   On: 01/03/2016 14:34   US Carotid Bilateral (at [facility] Only)  Result Date: 01/04/2016 CLINICAL DATA:  Right-sided weakness and numbness. EXAM: BILATERAL CAROTID DUPLEX ULTRASOUND TECHNIQUE: Pearline Cables scale imaging, color Doppler and duplex ultrasound were performed of bilateral carotid and vertebral arteries in the neck. COMPARISON:  None. FINDINGS: Criteria: Quantification of carotid stenosis is based on velocity parameters that correlate the residual internal carotid diameter with NASCET-based stenosis levels, using the diameter of the  distal internal carotid lumen as the denominator for stenosis measurement. The following velocity measurements were obtained: RIGHT ICA:  51 cm/sec CCA:  57 cm/sec SYSTOLIC ICA/CCA RATIO:  0.9 DIASTOLIC ICA/CCA RATIO:  1.4 ECA:  179 cm/sec LEFT ICA:  76 cm/sec CCA:  99 cm/sec SYSTOLIC ICA/CCA RATIO:  0.8 DIASTOLIC ICA/CCA RATIO:  2.0 ECA:  103 cm/sec RIGHT CAROTID ARTERY: Small amount of heterogeneous plaque in the right common carotid artery. Small amount of plaque in the right internal carotid artery. Normal waveforms and velocities in the right internal carotid artery. RIGHT VERTEBRAL ARTERY: Antegrade flow and normal waveform in the right vertebral artery. LEFT CAROTID ARTERY: Scattered small echogenic plaque in the proximal left common carotid artery. Left internal carotid artery patent but without significant plaque or stenosis. Normal waveforms and velocities in the left internal carotid artery. LEFT VERTEBRAL ARTERY: Antegrade flow and normal waveform in the left vertebral artery. IMPRESSION: Mild atherosclerotic disease in the carotid arteries. Estimated degree of stenosis in the internal carotid arteries is less than 50% bilaterally. Patent vertebral arteries. Electronically Signed   By: Markus Daft M.D.   On: 01/04/2016 09:38   Dg Swallowing Func-speech Pathology  Result Date: 01/04/2016 Objective Swallowing Evaluation: Type of Study: MBS-Modified Barium Swallow Study Patient Details Name: Connie Ponce MRN: [id number] Date of Birth: 02-Feb-1946  Today's Date: 01/04/2016 Time: SLP Start Time (ACUTE ONLY): 1215-SLP Stop Time (ACUTE ONLY): 1244 SLP Time Calculation (min) (ACUTE ONLY): 29 min  Past Medical History:    Past Medical History: Diagnosis Date . Anxiety  . Carpal tunnel syndrome  . COPD (chronic obstructive pulmonary disease) ([place])  . Gastric ulcer  . GERD (gastroesophageal reflux disease)  . Hyperglycemia  . Hyperlipidemia  . Hypertension  . Hypothyroidism  . Osteoarthritis  . Peptic  ulcer disease  . Pneumonia 2010 . Renal insufficiency  . Spinal stenosis 2010  lumbar . Stroke ([place]) 1990s  mild . TIA (transient ischemic attack)   Past Surgical History:     Past Surgical History: Procedure Laterality Date . ABDOMINAL HYSTERECTOMY   . APPENDECTOMY   . BACK SURGERY   . BONE MARROW ASPIRATION Left 04/22/14 . BONE MARROW BIOPSY Left 04/22/14 . CHOLECYSTECTOMY   . COLONOSCOPY    ?2005, [place] . COLONOSCOPY N/A 04/07/2015  MWN:[id number] . COLONOSCOPY WITH ESOPHAGOGASTRODUODENOSCOPY (EGD)  02/11/2012  RMR: Ulcerative/erosive reflux esophagitis, benign appearing gastric ulcer with unremarkable biopsy, no H pylori. Colonoscopy was unremarkable. Next colonoscopy in 2023 . CYSTECTOMY    cyst removed from rectal area.  . ESOPHAGOGASTRODUODENOSCOPY  01/05/2003  RMR: Normal esophagus, small hiatal hernia/ A couple of tiny antral erosions, otherwise normal stomach normal D1 and D2.  . ESOPHAGOGASTRODUODENOSCOPY N/A 08/11/2014  GUY:[id number] ulcer/HH . ESOPHAGOGASTRODUODENOSCOPY N/A 12/01/2014  VZD:[id number] improved gastric ulcerations/p bx . HEMORRHOID SURGERY   . JOINT REPLACEMENT Right   knee  HPI: The patient is an 70 year old black female who woke up yesterday with numbness and weakness of the right upper extremity. The chart indicates that she had similar symptoms of the right leg but in questioning the patient she denies any numbness of the right leg. Her primary symptom seems to be numbness of the right upper extremity. She does report some heaviness. Again the legs are not involved. She has had some issues with pain and numbness of the legs but both legs are involving these occur chronically and episodically. She has had some neck pain but does not seems related to the right upper extremity symptoms. She denies chest pain, headaches or shortness of breath. She does not report dysarthria and dysphagia. The patient reports that she did have a mini stroke in the past  with facial weakness and right upper extremity weakness. The primary area of concern however was facial weakness. She recovered from this. The review systems is otherwise negative. Neurology note:Likely post-lesional recrudescence from chronic lacunar infarct in the setting of low-grade UTI, acute renal failure and dehydration. No evidence of acute infarct. Physical examination does not support radiculopathy. Chronic left putaminal infarct. This is likely a chronic lacunar infarct. Risk factors hypertension, age and dyslipidemia. The patient aspirin will be increased to 162 mg daily. She currently is nothing by mouth and therefore will give her aspirin rectally for now. Subjective: "It feels like food gets stuck on the right side of my neck."   Assessment / Plan / Recommendation  CHL IP CLINICAL IMPRESSIONS 01/04/2016 Therapy Diagnosis Mild cervical esophageal phase dysphagia Clinical Impression Pt seen upright in hausted chair for MBSS. Oropharyngeal swallow essentially WFL. Pt demonstrated premature spillage with thin liquids both via cup and straw sip with underepiglottic coating, but no penetration deeper into laryngeal vestibule. No pharyngeal residue or pooling noted after the swallow. Pt described globus sensation and pointed to right sternal area when consuming graham crackers (no objective correlation observed). Pt swallowed barium tablet with thin barium and barium tablet became delayed in distal esophagus near the UES, anterior to an osteophyte near C-7. Pt gagged several times and brought it back up to almost the pyriforms before becoming lodged again in the same spot. Pt given bites of puree in attempt to help push the pill down, however the puree just moved around barium tablet. Pt then given sequential straw swallows of thin water which eventually cleared the barium tablet. Suspect pt's complaints and globus sensation are due to narrowing in distal esophagus. Pt may benefit from GI  consult (she  says she has an appointment with Dr. Buford Dresser soon).  There does appear to be a presence of an osteophyte at the level where the pill became lodged. Recommend D3/mech soft with thin liquids. Pt educated on soft solids and adding liquids (gravies, sauces, broth) to dry foods to help with esophageal clearance. Pt reports that she already tries to avoid dry solids.  Impact on safety and function Mild aspiration risk   CHL IP TREATMENT RECOMMENDATION 01/04/2016 Treatment Recommendations No treatment recommended at this time   Prognosis 01/04/2016 Prognosis for Safe Diet Advancement Fair Barriers to Reach Goals (No Data) Barriers/Prognosis Comment --   CHL IP DIET RECOMMENDATION 01/04/2016 SLP Diet Recommendations Dysphagia 3 (Mech soft) solids;Thin liquid Liquid Administration via Cup;Straw Medication Administration Whole meds with liquid Compensations Follow solids with liquid Postural Changes Remain semi-upright after after feeds/meals (Comment);Seated upright at 90 degrees    CHL IP OTHER RECOMMENDATIONS 01/04/2016 Recommended Consults Consider GI evaluation;Consider esophageal assessment Oral Care Recommendations Oral care BID;Patient independent with oral care Other Recommendations Clarify dietary restrictions    CHL IP FOLLOW UP RECOMMENDATIONS 01/04/2016 Follow up Recommendations None    No flowsheet data found.      CHL IP ORAL PHASE 01/04/2016 Oral Phase WFL Oral - Pudding Teaspoon -- Oral - Pudding Cup -- Oral - Honey Teaspoon -- Oral - Honey Cup -- Oral - Nectar Teaspoon -- Oral - Nectar Cup -- Oral - Nectar Straw -- Oral - Thin Teaspoon -- Oral - Thin Cup -- Oral - Thin Straw -- Oral - Puree -- Oral - Mech Soft -- Oral - Regular -- Oral - Multi-Consistency -- Oral - Pill -- Oral Phase - Comment --  CHL IP PHARYNGEAL PHASE 01/04/2016 Pharyngeal Phase WFL Pharyngeal- Pudding Teaspoon -- Pharyngeal -- Pharyngeal- Pudding Cup -- Pharyngeal -- Pharyngeal- Honey Teaspoon -- Pharyngeal --  Pharyngeal- Honey Cup -- Pharyngeal -- Pharyngeal- Nectar Teaspoon -- Pharyngeal -- Pharyngeal- Nectar Cup -- Pharyngeal -- Pharyngeal- Nectar Straw -- Pharyngeal -- Pharyngeal- Thin Teaspoon -- Pharyngeal -- Pharyngeal- Thin Cup -- Pharyngeal -- Pharyngeal- Thin Straw -- Pharyngeal -- Pharyngeal- Puree -- Pharyngeal -- Pharyngeal- Mechanical Soft -- Pharyngeal -- Pharyngeal- Regular -- Pharyngeal -- Pharyngeal- Multi-consistency -- Pharyngeal -- Pharyngeal- Pill -- Pharyngeal -- Pharyngeal Comment --   CHL IP CERVICAL ESOPHAGEAL PHASE 01/04/2016 Cervical Esophageal Phase Impaired Pudding Teaspoon -- Pudding Cup -- Honey Teaspoon -- Honey Cup -- Nectar Teaspoon -- Nectar Cup -- Nectar Straw -- Thin Teaspoon -- Thin Cup -- Thin Straw -- Puree -- Mechanical Soft -- Regular -- Multi-consistency -- Pill Other (Comment) Cervical Esophageal Comment Barium tablet became lodged in proximal esophagus just below UES   CHL IP GO 01/04/2016 Functional Assessment Tool Used clinical judgment Functional Limitations Swallowing Swallow Current Status (J2878) CJ Swallow Goal Status (M7672) CI Swallow Discharge Status (C9470) CI Motor Speech Current Status (J6283) (None) Motor Speech Goal Status (M6294) (None) Motor Speech Goal Status (T6546) (None) Spoken Language Comprehension Current Status (T0354) (None) Spoken Language Comprehension Goal Status (S5681) (None) Spoken Language Comprehension Discharge Status (E7517) (None) Spoken Language Expression Current Status (G0174) (None) Spoken Language Expression Goal Status (B4496) (None) Spoken Language Expression Discharge Status (P5916) (None) Attention Current Status (B8466) (None) Attention Goal Status (Z9935) (None) Attention Discharge Status (T0177) (None) Memory Current Status (L3903) (None) Memory Goal Status (E0923) (None) Memory Discharge Status (R0076) (None) Voice Current Status (A2633) (None) Voice Goal Status (H5456) (None) Voice Discharge Status (Y5638) (None) Other  Speech-Language Pathology Functional Limitation (L3734) (None)  Other Speech-Language Pathology Functional Limitation Goal Status [phone]) (None) Other Speech-Language Pathology Functional Limitation Discharge Status [id number]) (None)  Thank you,  Genene Churn, [place] 01/04/2016, 2:11 PM       Micro Results     Recent Results (from the past 240 hour(s))  Urine culture     Status: Abnormal (Preliminary result)   Collection Time: 01/03/16 11:00 AM  Result Value Ref Range Status   Specimen Description URINE, CLEAN CATCH  Final   Special Requests NONE  Final   Culture >=100,000 COLONIES/mL GRAM NEGATIVE RODS (A)  Final   Report Status PENDING  Incomplete    Today   Subjective    Connie Ponce today has no headache,no chest abdominal pain,no new weakness tingling or numbness, feels much better wants to go home today.    Objective   Blood pressure 137/64, pulse 78, temperature 97.6 F (36.4 C), temperature source Oral, resp. rate 20, height '5\' 3"'  (1.6 m), weight 89.9 kg (198 lb 3.2 oz), SpO2 98 %.   Intake/Output Summary (Last 24 hours) at 01/04/16 1450 Last data filed at 01/04/16 1200  Gross per 24 hour  Intake           656.25 ml  Output                0 ml  Net           656.25 ml    Exam Awake Alert, Oriented x 3, No new F.N deficits, mild R arm weakness, Normal affect [place].AT,PERRAL Supple Neck,No JVD, No cervical lymphadenopathy appriciated.  Symmetrical Chest wall movement, Good air movement bilaterally, CTAB RRR,No Gallops,Rubs or new Murmurs, No Parasternal Heave +ve B.Sounds, Abd Soft, Non tender, No organomegaly appriciated, No rebound -guarding or rigidity. No Cyanosis, Clubbing or edema, No new Rash or bruise   Data Review   CBC w Diff:  Lab Results  Component Value Date   WBC 4.8 01/03/2016   HGB 12.6 01/03/2016   HCT 37.3 01/03/2016   PLT 166 01/03/2016   LYMPHOPCT 38 07/04/2015   MONOPCT 8 07/04/2015   EOSPCT 9 07/04/2015    BASOPCT 1 07/04/2015    CMP:  Lab Results  Component Value Date   NA 139 01/04/2016   NA 140 01/17/2012   K 3.5 01/04/2016   K 3.7 01/17/2012   CL 107 01/04/2016   CO2 25 01/04/2016   BUN 15 01/04/2016   BUN 17 01/17/2012   CREATININE 0.99 01/04/2016   CREATININE 1.32 01/17/2012   PROT 6.6 01/03/2016   ALBUMIN 3.4 (L) 01/03/2016   ALBUMIN 3.9 01/17/2012   BILITOT 0.3 01/03/2016   BILITOT 0.2 01/17/2012   ALKPHOS 91 01/03/2016   ALKPHOS 92 01/17/2012   AST 17 01/03/2016   AST 12 01/17/2012   ALT 11 (L) 01/03/2016  .   Total Time in preparing paper work, data evaluation and todays exam - 35 minutes  Thurnell Lose M.D on 01/04/2016 at 2:50 PM  [facility]   Office  [phone]

## 2016-01-04 NOTE — Care Management Note (Signed)
Case Management Note  Patient Details  Name: Connie Ponce MRN: [id number] Date of Birth: 04-10-1945  Subjective/Objective:  Patient adm from home with TIA. She is from home with husband and is ind with ADL's. She has a PCP, transportation, and no issues affording medications. She has been recommended for outpatient PT, OT, and SLP.                   Action/Plan: Spoke with patient regarding Outpatient therapy, patient declines. She will need a walker at discharge. [facility] and will [facility] from chart. Dan HumphreysWalker will be delivered to room prior to discharge.   Expected Discharge Date:        01/04/2016          Expected Discharge Plan:  Home/Self Care  In-House Referral:  NA  Discharge planning Services  CM Consult  Post Acute Care Choice:    Choice offered to:  Patient  DME Arranged:    DME Agency:     HH Arranged:    HH Agency:     Status of Service:  Completed, signed off  If discussed at [employer] Length of Stay Meetings, dates discussed:    Additional Comments:  Connie Ponce, Connie OilerSharley Diane, RN 01/04/2016, 1:21 PM

## 2016-01-05 LAB — HEMOGLOBIN A1C
HEMOGLOBIN A1C: 5.8 % — AB (ref 4.8–5.6)
MEAN PLASMA GLUCOSE: 120 mg/dL

## 2016-01-05 LAB — URINE CULTURE: Culture: >=100000 — AB

## 2016-01-23 ENCOUNTER — Other Ambulatory Visit: Payer: Self-pay

## 2016-01-23 ENCOUNTER — Ambulatory Visit (INDEPENDENT_AMBULATORY_CARE_PROVIDER_SITE_OTHER): Payer: Medicare HMO | Admitting: Nurse Practitioner

## 2016-01-23 ENCOUNTER — Encounter: Payer: Self-pay | Admitting: Nurse Practitioner

## 2016-01-23 VITALS — BP 135/56 | HR 71 | Temp 98.0°F | Ht 63.0 in | Wt 199.0 lb

## 2016-01-23 DIAGNOSIS — R1310 Dysphagia, unspecified: Secondary | ICD-10-CM

## 2016-01-23 DIAGNOSIS — K5900 Constipation, unspecified: Secondary | ICD-10-CM

## 2016-01-23 NOTE — Assessment & Plan Note (Signed)
Constipation is unchanged. After discussing with the patient, it appears she did not follow any of our instructions from her last visit. She is willing to try Trulance again. I have reemphasized the need to take it daily. I will provide 2 weeks of samples, she is to call us in 1-2 weeks with a progress report. Return for follow-up in 3 months.

## 2016-01-23 NOTE — Assessment & Plan Note (Signed)
The patient notes solid food and pill dysphagia lasting approximately the past 3 weeks. She did have a modified barium swallow study by speech language pathology while admitted to the hospital for TIA with minimal to no residual deficits. MBS S found lodging of barium tablet near the lower esophageal sphincter which did not pass until copious fluids. Feels she would benefit from an upper endoscopy with possible dilation due to likely esophageal narrowing. After reviewing report, I agree diagnostic upper endoscopy with possible dilation is indicated. We will proceed with that procedure.  Proceed with EGD +/- dilation with Dr. Jena Gaussourk in near future: the risks, benefits, and alternatives have been discussed with the patient in detail. The patient states understanding and desires to proceed.  She was currently on Norco. No other anticoagulants, anxiolytics, chronic pain medications, or antidepressants. We will add 12.5 mg preprocedure Phenergan to promote adequate sedation.

## 2016-01-23 NOTE — Patient Instructions (Signed)
1. Take Trulance EVERY DAY. 2. Call us in 1-2 weeks and let us know how it is working, if it is working 3. We will schedule your procedure for you 4. Further recommendations/instruction will be based on the results of your procedure. 5. Follow-up in 3 months.

## 2016-01-23 NOTE — Progress Notes (Signed)
cc'ed to pcp °

## 2016-01-23 NOTE — Progress Notes (Signed)
Referring Provider: [name], NP Primary Care Physician:  [name], NP Primary GI:  Dr. Gala Romney  Chief Complaint  Patient presents with  . Constipation  . Dysphagia    feels like food/pills get throat, right side of throat feels like something stuck all the time    HPI:   Connie Ponce is a 70 y.o. female who presents for follow-up on constipation. The patient was last seen in our office 10/23/2015. Also appears to be complaining of dysphagia today. At that time she was noted to be doing well overall, Linzess with adverse effects including decreased energy and was changed Amitiza which had been working well. However, on further discussion seen to be having some productive bowel movements, only a bowel movement every 3-4 days and requires over-the-counter laxative. No other GI symptoms. She was changed to Trulance 3 mg once a day on an empty stomach, hold Amitiza will taking Trulance, call with results of the new medication in 1-2 weeks. No progress report telephone note is found in our system.  Today she states she's ok overall. She was taking Trulance occasionally and having large bowel movements. She did not call with results of medication use. States she wasn't aware to take Trulance daily. Reinforced previous instructions with her and she is willing to try it again. Is also having dysphagia symptoms 3 weeks ago. Had a MBSS during recent hospitalization for TIA with results as outlined below. Recommended dysphagia III diet. States she's following SLP instructions. States she eats "softer foods and I may nee an esophagus stretch." Solid food dysphagia and pill dysphagia. Occasional regurgitation. She has a history of GERD and PUD. GERD symptoms well controlled. Denies abdominal pain, N/V, hematochezia. States he stools are black. Denies chest pain, dyspnea, dizziness, lightheadedness, syncope, near syncope. Denies any other upper or lower GI symptoms.  Excerpts from  MBSS: "Pt described globus sensation and pointed to right sternal area when consuming graham crackers (no objective correlation observed). Pt swallowed barium tablet with thin barium and barium tablet became delayed in distal esophagus near the UES, anterior to an osteophyte near C-7. Pt gagged several times and brought it back up to almost the pyriforms before becoming lodged again in the same spot. Pt given bites of puree in attempt to help push the pill down, however the puree just moved around barium tablet. Pt then given sequential straw swallows of thin water which eventually cleared the barium tablet. Suspect pt's complaints and globus sensation are due to narrowing in distal esophagus. Pt may benefit from GI consult (she says she has an appointment with Dr. Buford Dresser soon)."  Past Medical History:  Diagnosis Date  . Anxiety   . Carpal tunnel syndrome   . COPD (chronic obstructive pulmonary disease) ([place])   . Gastric ulcer   . GERD (gastroesophageal reflux disease)   . Hyperglycemia   . Hyperlipidemia   . Hypertension   . Hypothyroidism   . Osteoarthritis   . Peptic ulcer disease   . Pneumonia 2010  . Renal insufficiency   . 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 Stroke ([place]) 1990s   mild  . TIA (transient ischemic attack)     Past Surgical History:  Procedure Laterality Date  . ABDOMINAL HYSTERECTOMY    . APPENDECTOMY    . BACK SURGERY    . BONE MARROW ASPIRATION Left 04/22/14  . BONE MARROW BIOPSY Left 04/22/14  . CHOLECYSTECTOMY    . COLONOSCOPY     ?  2005, [place]  . COLONOSCOPY N/A 04/07/2015   IFO:YDXAJO  . COLONOSCOPY WITH ESOPHAGOGASTRODUODENOSCOPY (EGD)  02/11/2012   RMR: Ulcerative/erosive reflux esophagitis, benign appearing gastric ulcer with unremarkable biopsy, no H pylori. Colonoscopy was unremarkable. Next colonoscopy in 2023  . CYSTECTOMY     cyst removed from rectal area.   . ESOPHAGOGASTRODUODENOSCOPY  01/05/2003   RMR: Normal esophagus, small hiatal  hernia/ A couple of tiny antral erosions, otherwise normal stomach normal D1 and D2.   . ESOPHAGOGASTRODUODENOSCOPY N/A 08/11/2014   INO:MVEHMCN ulcer/HH  . ESOPHAGOGASTRODUODENOSCOPY N/A 12/01/2014   OBS:[id number] improved gastric ulcerations/p bx  . HEMORRHOID SURGERY    . JOINT REPLACEMENT Right    knee    Current Outpatient Prescriptions  Medication Sig Dispense Refill  . aspirin EC 81 MG EC tablet Take 2 tablets (162 mg total) by mouth daily. 60 tablet 0  . budesonide-formoterol (SYMBICORT) 160-4.5 MCG/ACT inhaler Inhale 2 puffs into the lungs 2 (two) times daily as needed (shortness of breath).     . Cholecalciferol (VITAMIN D-3) 5000 UNITS TABS Take 1 tablet by mouth daily.    Marland Kitchen DEXILANT 60 MG capsule TAKE (1) CAPSULE BY MOUTH ONCE DAILY. 28 capsule 11  . diltiazem (TIAZAC) 360 MG 24 hr capsule Take 360 mg by mouth daily.    . furosemide (LASIX) 20 MG tablet Take 40 mg by mouth daily.    . Hydrocortisone (GERHARDT'S BUTT CREAM) CREA Apply 1 application topically 3 (three) times daily. (Patient taking differently: Apply 1 application topically 3 (three) times daily as needed for irritation. ) 1 each 11  . imipramine (TOFRANIL) 25 MG tablet Take 25 mg by mouth as needed.     Marland Kitchen levothyroxine (SYNTHROID, LEVOTHROID) 125 MCG tablet Take 125 mcg by mouth daily before breakfast.    . lubiprostone (AMITIZA) 24 MCG capsule Take 24 mcg by mouth 2 (two) times daily with a meal.    . meclizine (ANTIVERT) 25 MG tablet Take 25 mg by mouth as needed for dizziness. Reported on 10/06/2015    . oxybutynin (DITROPAN) 5 MG tablet Take 1 tablet (5 mg total) by mouth 3 (three) times daily. 90 tablet 3  . oxyCODONE-acetaminophen (PERCOCET/ROXICET) 5-325 MG tablet 1 tablet every 6 (six) hours as needed for moderate pain or severe pain.     . potassium chloride 20 MEQ TBCR Take 20 mEq by mouth daily. 30 tablet 0  . pravastatin (PRAVACHOL) 40 MG tablet Take 40 mg by mouth daily.    . ranitidine (ZANTAC)  150 MG tablet Take 150 mg by mouth at bedtime.     . solifenacin (VESICARE) 10 MG tablet Take 10 mg by mouth daily.    . traZODone (DESYREL) 150 MG tablet Take 300 mg by mouth at bedtime.    . triamterene-hydrochlorothiazide (MAXZIDE-25) 37.5-25 MG per tablet Take 1 tablet by mouth daily.     No current facility-administered medications for this visit.     Allergies as of 01/23/2016 - Review Complete 01/23/2016  Allergen Reaction Noted  . Tetracyclines & related  08/21/2015  . Shellfish-derived products Nausea And Vomiting 07/15/2015    Family History  Problem Relation Age of Onset  . Other Mother     died age 27, natural causes  . Stroke Mother   . Diabetes Daughter   . Healthy Son   . Healthy Daughter   . Healthy Daughter   . Colon cancer Neg Hx   . Liver disease Neg Hx     Social History  Social History  . Marital status: Married    Spouse name: N/A  . Number of children: 3  . Years of education: N/A   Social History Main Topics  . Smoking status: Current Some Day Smoker    Packs/day: 0.50    Types: Cigarettes  . Smokeless tobacco: Never Used     Comment: Is trying to quit, actively cutting back  . Alcohol use No  . Drug use: No  . Sexual activity: No   Other Topics Concern  . None   Social History Narrative  . None    Review of Systems: General: Negative for anorexia, weight loss, fever, chills, fatigue, weakness. ENT: Negative for hoarseness, difficulty swallowing. CV: Negative for chest pain, angina, palpitations, peripheral edema.  Respiratory: Negative for dyspnea at rest, cough, sputum, wheezing.  GI: See history of present illness. Derm: Complains of dry skin.  Endo: Negative for unusual weight change.  Heme: Negative for bruising or bleeding. Allergy: Negative for rash or hives.   Physical Exam: BP (!) 135/56   Pulse 71   Temp 98 F (36.7 C) (Oral)   Ht 5' 3"  (1.6 m)   Wt 199 lb (90.3 kg)   BMI 35.25 kg/m  General:   Alert and  oriented. Pleasant and cooperative. Well-nourished and well-developed.  Head:  Normocephalic and atraumatic. Eyes:  Without icterus, sclera clear and conjunctiva pink.  Ears:  Normal auditory acuity. Cardiovascular:  S1, S2 present without murmurs appreciated. Extremities without clubbing or edema. Respiratory:  Clear to auscultation bilaterally. No wheezes, rales, or rhonchi. No distress.  Gastrointestinal:  +BS, soft, and non-distended. Mild right-sided TTP. No HSM noted. No guarding or rebound. No masses appreciated.  Rectal:  Deferred  Neurologic:  Alert and oriented x4;  grossly normal neurologically. Psych:  Alert and cooperative. Normal mood and affect. Heme/Lymph/Immune: No excessive bruising noted.    01/23/2016 9:25 AM   Disclaimer: This note was dictated with voice recognition software. Similar sounding words can inadvertently be transcribed and may not be corrected upon review.

## 2016-01-24 NOTE — Patient Instructions (Signed)
No PA was needed for the EGD the Ref# for the call is  [id number]

## 2016-01-30 ENCOUNTER — Other Ambulatory Visit: Payer: Self-pay | Admitting: Obstetrics & Gynecology

## 2016-01-31 ENCOUNTER — Encounter (HOSPITAL_COMMUNITY)
Admission: RE | Disposition: A | Discharge status: Home / Self Care | Payer: Self-pay | Source: Ambulatory Visit | Attending: Internal Medicine

## 2016-01-31 ENCOUNTER — Encounter (HOSPITAL_COMMUNITY): Payer: Self-pay | Admitting: *Deleted

## 2016-01-31 ENCOUNTER — Ambulatory Visit (HOSPITAL_COMMUNITY)
Admission: RE | Admit: 2016-01-31 | Discharge: 2016-01-31 | Disposition: A | Discharge status: Home / Self Care | Payer: Medicare HMO | Source: Ambulatory Visit | Attending: Internal Medicine | Admitting: Internal Medicine

## 2016-01-31 DIAGNOSIS — K222 Esophageal obstruction: Secondary | ICD-10-CM

## 2016-01-31 DIAGNOSIS — E785 Hyperlipidemia, unspecified: Secondary | ICD-10-CM | POA: Insufficient documentation

## 2016-01-31 DIAGNOSIS — I10 Essential (primary) hypertension: Secondary | ICD-10-CM | POA: Insufficient documentation

## 2016-01-31 DIAGNOSIS — K319 Disease of stomach and duodenum, unspecified: Secondary | ICD-10-CM

## 2016-01-31 DIAGNOSIS — Z7982 Long term (current) use of aspirin: Secondary | ICD-10-CM | POA: Insufficient documentation

## 2016-01-31 DIAGNOSIS — K269 Duodenal ulcer, unspecified as acute or chronic, without hemorrhage or perforation: Secondary | ICD-10-CM | POA: Insufficient documentation

## 2016-01-31 DIAGNOSIS — K449 Diaphragmatic hernia without obstruction or gangrene: Secondary | ICD-10-CM | POA: Diagnosis not present

## 2016-01-31 DIAGNOSIS — Z8711 Personal history of peptic ulcer disease: Secondary | ICD-10-CM | POA: Insufficient documentation

## 2016-01-31 DIAGNOSIS — Z8719 Personal history of other diseases of the digestive system: Secondary | ICD-10-CM | POA: Insufficient documentation

## 2016-01-31 DIAGNOSIS — K3189 Other diseases of stomach and duodenum: Secondary | ICD-10-CM | POA: Insufficient documentation

## 2016-01-31 DIAGNOSIS — F419 Anxiety disorder, unspecified: Secondary | ICD-10-CM | POA: Diagnosis not present

## 2016-01-31 DIAGNOSIS — K219 Gastro-esophageal reflux disease without esophagitis: Secondary | ICD-10-CM | POA: Insufficient documentation

## 2016-01-31 DIAGNOSIS — R1310 Dysphagia, unspecified: Secondary | ICD-10-CM

## 2016-01-31 DIAGNOSIS — Z96651 Presence of right artificial knee joint: Secondary | ICD-10-CM | POA: Insufficient documentation

## 2016-01-31 DIAGNOSIS — J449 Chronic obstructive pulmonary disease, unspecified: Secondary | ICD-10-CM | POA: Insufficient documentation

## 2016-01-31 DIAGNOSIS — F17210 Nicotine dependence, cigarettes, uncomplicated: Secondary | ICD-10-CM | POA: Insufficient documentation

## 2016-01-31 DIAGNOSIS — E039 Hypothyroidism, unspecified: Secondary | ICD-10-CM | POA: Insufficient documentation

## 2016-01-31 DIAGNOSIS — Z8673 Personal history of transient ischemic attack (TIA), and cerebral infarction without residual deficits: Secondary | ICD-10-CM | POA: Insufficient documentation

## 2016-01-31 DIAGNOSIS — M1990 Unspecified osteoarthritis, unspecified site: Secondary | ICD-10-CM | POA: Diagnosis not present

## 2016-01-31 HISTORY — PX: ESOPHAGOGASTRODUODENOSCOPY: SHX5428

## 2016-01-31 HISTORY — PX: MALONEY DILATION: SHX5535

## 2016-01-31 SURGERY — EGD (ESOPHAGOGASTRODUODENOSCOPY)
Anesthesia: Moderate Sedation

## 2016-01-31 MED ORDER — ONDANSETRON HCL 4 MG/2ML IJ SOLN
INTRAMUSCULAR | Status: DC | PRN
  Administered 2016-01-31: 4 mg via INTRAVENOUS

## 2016-01-31 MED ORDER — MEPERIDINE HCL 100 MG/ML IJ SOLN
INTRAMUSCULAR | Status: DC | PRN
  Administered 2016-01-31: 50 mg

## 2016-01-31 MED ORDER — MIDAZOLAM HCL 5 MG/5ML IJ SOLN
INTRAMUSCULAR | Status: DC | PRN
  Administered 2016-01-31: 1 mg via INTRAVENOUS
  Administered 2016-01-31: 2 mg via INTRAVENOUS

## 2016-01-31 MED ORDER — LIDOCAINE VISCOUS 2 % MT SOLN
OROMUCOSAL | Status: DC | PRN
  Administered 2016-01-31: 1 via OROMUCOSAL

## 2016-01-31 MED ORDER — PROMETHAZINE HCL 25 MG/ML IJ SOLN
12.5000 mg | Freq: Once | INTRAMUSCULAR | Status: AC
  Administered 2016-01-31: 12.5 mg via INTRAVENOUS

## 2016-01-31 MED ORDER — LIDOCAINE VISCOUS 2 % MT SOLN
OROMUCOSAL | Status: AC
  Filled 2016-01-31: qty 15

## 2016-01-31 MED ORDER — MEPERIDINE HCL 100 MG/ML IJ SOLN
INTRAMUSCULAR | Status: AC
  Filled 2016-01-31: qty 2

## 2016-01-31 MED ORDER — SODIUM CHLORIDE 0.9% FLUSH
INTRAVENOUS | Status: AC
  Filled 2016-01-31: qty 10

## 2016-01-31 MED ORDER — SODIUM CHLORIDE 0.9 % IV SOLN
INTRAVENOUS | Status: DC
  Administered 2016-01-31: 07:00:00 via INTRAVENOUS

## 2016-01-31 MED ORDER — PROMETHAZINE HCL 25 MG/ML IJ SOLN
INTRAMUSCULAR | Status: AC
  Filled 2016-01-31: qty 1

## 2016-01-31 MED ORDER — ONDANSETRON HCL 4 MG/2ML IJ SOLN
INTRAMUSCULAR | Status: AC
  Filled 2016-01-31: qty 2

## 2016-01-31 MED ORDER — MIDAZOLAM HCL 5 MG/5ML IJ SOLN
INTRAMUSCULAR | Status: AC
  Filled 2016-01-31: qty 10

## 2016-01-31 NOTE — Interval H&P Note (Signed)
History and Physical Interval Note:  01/31/2016 8:01 AM  Connie Ponce  has presented today for surgery, with the diagnosis of DYSPHAGIA  The various methods of treatment have been discussed with the patient and family. After consideration of risks, benefits and other options for treatment, the patient has consented to  Procedure(s) with comments: ESOPHAGOGASTRODUODENOSCOPY (EGD) (N/A) - 745 MALONEY DILATION (N/A) as a surgical intervention .  The patient's history has been reviewed, patient examined, no change in status, stable for surgery.  I have reviewed the patient's chart and labs.  Questions were answered to the patient's satisfaction.     No change. EGD/ED per plan.  The risks, benefits, limitations, alternatives and imponderables have been reviewed with the patient. Potential for esophageal dilation, biopsy, etc. have also been reviewed.  Questions have been answered. All parties agreeable.   Eula Listenobert Jrue Jarriel

## 2016-01-31 NOTE — Op Note (Signed)
[facility] Patient Name: Connie BlakeDarlean Ponce Procedure Date: 01/31/2016 7:59 AM MRN: [id number] Date of Birth: 1945-03-27 Attending MD: Gennette Pacobert Michael Rourk , MD CSN: [id number] Age: [age] Admit Type: Outpatient Procedure:                Upper GI endoscopy with maloney diloation and                            gastric bx Indications:              Dysphagia Providers:                Gennette Pacobert Michael Rourk, MD, Jannett CelestineAnitra Bell, RN, Nena PolioLisa                            Moore, RN, Lollie Marrowaylor B. [place], Pensions consultantTechnician Referring MD:              Medicines:                Midazolam 3 mg IV, Meperidine 50 mg IV, Ondansetron                            4 mg IV, Promethazine 12.5 mg IV Complications:            No immediate complications. Estimated Blood Loss:     Estimated blood loss was minimal. Procedure:                Pre-Anesthesia Assessment:                           - Prior to the procedure, a History and Physical                            was performed, and patient medications and                            allergies were reviewed. The patient's tolerance of                            previous anesthesia was also reviewed. The risks                            and benefits of the procedure and the sedation                            options and risks were discussed with the patient.                            All questions were answered, and informed consent                            was obtained. Prior Anticoagulants: The patient has                            taken no previous anticoagulant or antiplatelet  agents. ASA Grade Assessment: II - A patient with                            mild systemic disease. After reviewing the risks                            and benefits, the patient was deemed in                            satisfactory condition to undergo the procedure.                           After obtaining informed consent, the endoscope was   passed under direct vision. Throughout the                            procedure, the patient's blood pressure, pulse, and                            oxygen saturations were monitored continuously. The                            [id number] ([id number]) scope was introduced through the                            mouth, and advanced to the second part of duodenum.                            The upper GI endoscopy was accomplished without                            difficulty. The patient tolerated the procedure                            well. Scope In: 8:11:41 AM Scope Out: 8:21:23 AM Total Procedure Duration: 0 hours 9 minutes 42 seconds  Findings:      A low-grade of narrowing Schatzki ring (acquired) was found at the       gastroesophageal junction. .      A small hiatal hernia was present.      Erythematous mucosa was found in the stomach.      A stenosis was found in the first portion of the duodenum and was       traversed with moderate resistance. Estimated blood loss was minimal.      One linear duodenal ulcer was found in the first portion of the duodenum       at the area stenosis. The lesion was 8 mm in largest dimension.      Diffuse erythematous mucosa without bleeding was found in the stomach.       The scope was withdrawn. Dilation was performed with a Maloney dilator       with mild resistance at 54 Fr. The scope was withdrawn. Dilation was       performed with a Maloney dilator with no resistance at 56 Fr. The       dilation site was examined following endoscope reinsertion  and showed       moderate improvement in luminal narrowing. Estimated blood loss was       minimal. gastric mucosa biopsied. Impression:               - Low-grade of narrowing Schatzki ring.                           - Small hiatal hernia.                           - Erythematous gastric mucosa - biopsied.                           -duodenal stenosis.                           - One duodenal ulcer.                            - No specimens collected. Moderate Sedation:      Moderate (conscious) sedation was administered by the endoscopy nurse       and supervised by the endoscopist. The following parameters were       monitored: oxygen saturation, heart rate, blood pressure, respiratory       rate, EKG, adequacy of pulmonary ventilation, and response to care.       Total physician intraservice time was 16 minutes. Recommendation:           - Patient has a contact number available for                            emergencies. The signs and symptoms of potential                            delayed complications were discussed with the                            patient. Return to normal activities tomorrow.                            Written discharge instructions were provided to the                            patient.                           - Resume previous diet.                           - Continue present medications. Continue Dexilant                            60 mg daily. Avoid NSAIDs.                           - No repeat upper endoscopy.                           -  Return to GI office in 6 weeks. Procedure Code(s):        --- Professional ---                           [phone], Esophagogastroduodenoscopy, flexible,                            transoral; diagnostic, including collection of                            specimen(s) by brushing or washing, when performed                            (separate procedure)                           43450, Dilation of esophagus, by unguided sound or                            bougie, single or multiple passes                           99152, Moderate sedation services provided by the                            same physician or other qualified health care                            professional performing the diagnostic or                            therapeutic service that the sedation supports,                            requiring the presence  of an independent trained                            observer to assist in the monitoring of the                            patient's level of consciousness and physiological                            status; initial 15 minutes of intraservice time,                            patient age 93 years or older Diagnosis Code(s):        --- Professional ---                           K22.2, Esophageal obstruction                           K44.9, Diaphragmatic hernia without obstruction or  gangrene                           K31.89, Other diseases of stomach and duodenum                           K26.9, Duodenal ulcer, unspecified as acute or                            chronic, without hemorrhage or perforation                           R13.10, Dysphagia, unspecified CPT copyright 2016 [employer]. All rights reserved. The codes documented in this report are preliminary and upon coder review may  be revised to meet current compliance requirements. Gerrit Friends. Rourk, MD Gennette Pac, MD 01/31/2016 8:40:03 AM This report has been signed electronically. Number of Addenda: 0

## 2016-01-31 NOTE — H&P (View-Only) (Signed)
Referring Provider: [name], NP Primary Care Physician:  [name], NP Primary GI:  Dr. Gala Romney  Chief Complaint  Patient presents with  . Constipation  . Dysphagia    feels like food/pills get throat, right side of throat feels like something stuck all the time    HPI:   Connie Ponce is a 70 y.o. female who presents for follow-up on constipation. The patient was last seen in our office 10/23/2015. Also appears to be complaining of dysphagia today. At that time she was noted to be doing well overall, Linzess with adverse effects including decreased energy and was changed Amitiza which had been working well. However, on further discussion seen to be having some productive bowel movements, only a bowel movement every 3-4 days and requires over-the-counter laxative. No other GI symptoms. She was changed to Trulance 3 mg once a day on an empty stomach, hold Amitiza will taking Trulance, call with results of the new medication in 1-2 weeks. No progress report telephone note is found in our system.  Today she states she's ok overall. She was taking Trulance occasionally and having large bowel movements. She did not call with results of medication use. States she wasn't aware to take Trulance daily. Reinforced previous instructions with her and she is willing to try it again. Is also having dysphagia symptoms 3 weeks ago. Had a MBSS during recent hospitalization for TIA with results as outlined below. Recommended dysphagia III diet. States she's following SLP instructions. States she eats "softer foods and I may nee an esophagus stretch." Solid food dysphagia and pill dysphagia. Occasional regurgitation. She has a history of GERD and PUD. GERD symptoms well controlled. Denies abdominal pain, N/V, hematochezia. States he stools are black. Denies chest pain, dyspnea, dizziness, lightheadedness, syncope, near syncope. Denies any other upper or lower GI symptoms.  Excerpts from  MBSS: "Pt described globus sensation and pointed to right sternal area when consuming graham crackers (no objective correlation observed). Pt swallowed barium tablet with thin barium and barium tablet became delayed in distal esophagus near the UES, anterior to an osteophyte near C-7. Pt gagged several times and brought it back up to almost the pyriforms before becoming lodged again in the same spot. Pt given bites of puree in attempt to help push the pill down, however the puree just moved around barium tablet. Pt then given sequential straw swallows of thin water which eventually cleared the barium tablet. Suspect pt's complaints and globus sensation are due to narrowing in distal esophagus. Pt may benefit from GI consult (she says she has an appointment with Dr. Buford Dresser soon)."  Past Medical History:  Diagnosis Date  . Anxiety   . Carpal tunnel syndrome   . COPD (chronic obstructive pulmonary disease) ([place])   . Gastric ulcer   . GERD (gastroesophageal reflux disease)   . Hyperglycemia   . Hyperlipidemia   . Hypertension   . Hypothyroidism   . Osteoarthritis   . Peptic ulcer disease   . Pneumonia 2010  . Renal insufficiency   . 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 Stroke ([place]) 1990s   mild  . TIA (transient ischemic attack)     Past Surgical History:  Procedure Laterality Date  . ABDOMINAL HYSTERECTOMY    . APPENDECTOMY    . BACK SURGERY    . BONE MARROW ASPIRATION Left 04/22/14  . BONE MARROW BIOPSY Left 04/22/14  . CHOLECYSTECTOMY    . COLONOSCOPY     ?  2005, [place]  . COLONOSCOPY N/A 04/07/2015   [id number]  . COLONOSCOPY WITH ESOPHAGOGASTRODUODENOSCOPY (EGD)  02/11/2012   RMR: Ulcerative/erosive reflux esophagitis, benign appearing gastric ulcer with unremarkable biopsy, no H pylori. Colonoscopy was unremarkable. Next colonoscopy in 2023  . CYSTECTOMY     cyst removed from rectal area.   . ESOPHAGOGASTRODUODENOSCOPY  01/05/2003   RMR: Normal esophagus, small hiatal  hernia/ A couple of tiny antral erosions, otherwise normal stomach normal D1 and D2.   . ESOPHAGOGASTRODUODENOSCOPY N/A 08/11/2014   [id number] ulcer/HH  . ESOPHAGOGASTRODUODENOSCOPY N/A 12/01/2014   GLO:[id number] improved gastric ulcerations/p bx  . HEMORRHOID SURGERY    . JOINT REPLACEMENT Right    knee    Current Outpatient Prescriptions  Medication Sig Dispense Refill  . aspirin EC 81 MG EC tablet Take 2 tablets (162 mg total) by mouth daily. 60 tablet 0  . budesonide-formoterol (SYMBICORT) 160-4.5 MCG/ACT inhaler Inhale 2 puffs into the lungs 2 (two) times daily as needed (shortness of breath).     . Cholecalciferol (VITAMIN D-3) 5000 UNITS TABS Take 1 tablet by mouth daily.    Marland Kitchen DEXILANT 60 MG capsule TAKE (1) CAPSULE BY MOUTH ONCE DAILY. 28 capsule 11  . diltiazem (TIAZAC) 360 MG 24 hr capsule Take 360 mg by mouth daily.    . furosemide (LASIX) 20 MG tablet Take 40 mg by mouth daily.    . Hydrocortisone (GERHARDT'S BUTT CREAM) CREA Apply 1 application topically 3 (three) times daily. (Patient taking differently: Apply 1 application topically 3 (three) times daily as needed for irritation. ) 1 each 11  . imipramine (TOFRANIL) 25 MG tablet Take 25 mg by mouth as needed.     Marland Kitchen levothyroxine (SYNTHROID, LEVOTHROID) 125 MCG tablet Take 125 mcg by mouth daily before breakfast.    . lubiprostone (AMITIZA) 24 MCG capsule Take 24 mcg by mouth 2 (two) times daily with a meal.    . meclizine (ANTIVERT) 25 MG tablet Take 25 mg by mouth as needed for dizziness. Reported on 10/06/2015    . oxybutynin (DITROPAN) 5 MG tablet Take 1 tablet (5 mg total) by mouth 3 (three) times daily. 90 tablet 3  . oxyCODONE-acetaminophen (PERCOCET/ROXICET) 5-325 MG tablet 1 tablet every 6 (six) hours as needed for moderate pain or severe pain.     . potassium chloride 20 MEQ TBCR Take 20 mEq by mouth daily. 30 tablet 0  . pravastatin (PRAVACHOL) 40 MG tablet Take 40 mg by mouth daily.    . ranitidine (ZANTAC)  150 MG tablet Take 150 mg by mouth at bedtime.     . solifenacin (VESICARE) 10 MG tablet Take 10 mg by mouth daily.    . traZODone (DESYREL) 150 MG tablet Take 300 mg by mouth at bedtime.    . triamterene-hydrochlorothiazide (MAXZIDE-25) 37.5-25 MG per tablet Take 1 tablet by mouth daily.     No current facility-administered medications for this visit.     Allergies as of 01/23/2016 - Review Complete 01/23/2016  Allergen Reaction Noted  . Tetracyclines & related  08/21/2015  . Shellfish-derived products Nausea And Vomiting 07/15/2015    Family History  Problem Relation Age of Onset  . Other Mother     died age 80, natural causes  . Stroke Mother   . Diabetes Daughter   . Healthy Son   . Healthy Daughter   . Healthy Daughter   . Colon cancer Neg Hx   . Liver disease Neg Hx     Social History  Social History  . Marital status: Married    Spouse name: N/A  . Number of children: 3  . Years of education: N/A   Social History Main Topics  . Smoking status: Current Some Day Smoker    Packs/day: 0.50    Types: Cigarettes  . Smokeless tobacco: Never Used     Comment: Is trying to quit, actively cutting back  . Alcohol use No  . Drug use: No  . Sexual activity: No   Other Topics Concern  . None   Social History Narrative  . None    Review of Systems: General: Negative for anorexia, weight loss, fever, chills, fatigue, weakness. ENT: Negative for hoarseness, difficulty swallowing. CV: Negative for chest pain, angina, palpitations, peripheral edema.  Respiratory: Negative for dyspnea at rest, cough, sputum, wheezing.  GI: See history of present illness. Derm: Complains of dry skin.  Endo: Negative for unusual weight change.  Heme: Negative for bruising or bleeding. Allergy: Negative for rash or hives.   Physical Exam: BP (!) 135/56   Pulse 71   Temp 98 F (36.7 C) (Oral)   Ht 5' 3"  (1.6 m)   Wt 199 lb (90.3 kg)   BMI 35.25 kg/m  General:   Alert and  oriented. Pleasant and cooperative. Well-nourished and well-developed.  Head:  Normocephalic and atraumatic. Eyes:  Without icterus, sclera clear and conjunctiva pink.  Ears:  Normal auditory acuity. Cardiovascular:  S1, S2 present without murmurs appreciated. Extremities without clubbing or edema. Respiratory:  Clear to auscultation bilaterally. No wheezes, rales, or rhonchi. No distress.  Gastrointestinal:  +BS, soft, and non-distended. Mild right-sided TTP. No HSM noted. No guarding or rebound. No masses appreciated.  Rectal:  Deferred  Neurologic:  Alert and oriented x4;  grossly normal neurologically. Psych:  Alert and cooperative. Normal mood and affect. Heme/Lymph/Immune: No excessive bruising noted.    01/23/2016 9:25 AM   Disclaimer: This note was dictated with voice recognition software. Similar sounding words can inadvertently be transcribed and may not be corrected upon review.

## 2016-01-31 NOTE — Discharge Instructions (Addendum)
EGD Discharge instructions Please read the instructions outlined below and refer to this sheet in the next few weeks. These discharge instructions provide you with general information on caring for yourself after you leave the hospital. Your doctor may also give you specific instructions. While your treatment has been planned according to the most current medical practices available, unavoidable complications occasionally occur. If you have any problems or questions after discharge, please call your doctor. ACTIVITY  You may resume your regular activity but move at a slower pace for the next 24 hours.   Take frequent rest periods for the next 24 hours.   Walking will help expel (get rid of) the air and reduce the bloated feeling in your abdomen.   No driving for 24 hours (because of the anesthesia (medicine) used during the test).   You may shower.   Do not sign any important legal documents or operate any machinery for 24 hours (because of the anesthesia used during the test).  NUTRITION  Drink plenty of fluids.   You may resume your normal diet.   Begin with a light meal and progress to your normal diet.   Avoid alcoholic beverages for 24 hours or as instructed by your caregiver.  MEDICATIONS  You may resume your normal medications unless your caregiver tells you otherwise.  WHAT YOU CAN EXPECT TODAY  You may experience abdominal discomfort such as a feeling of fullness or gas pains.  FOLLOW-UP  Your doctor will discuss the results of your test with you.  SEEK IMMEDIATE MEDICAL ATTENTION IF ANY OF THE FOLLOWING OCCUR:  Excessive nausea (feeling sick to your stomach) and/or vomiting.   Severe abdominal pain and distention (swelling).   Trouble swallowing.   Temperature over 101 F (37.8 C).   Rectal bleeding or vomiting of blood.    Continue Dexilant 60 mg daily  We'll avoid aspirin and other nonsteroidals like ibuprofen/Aleve  Further recommendations to follow  pending review of pathology report.  Office visit with us in about 6-8 weeks

## 2016-02-01 ENCOUNTER — Encounter: Payer: Self-pay | Admitting: Internal Medicine

## 2016-02-02 ENCOUNTER — Encounter (HOSPITAL_COMMUNITY): Payer: Self-pay | Admitting: Internal Medicine

## 2016-02-06 ENCOUNTER — Telehealth: Payer: Self-pay

## 2016-02-06 MED ORDER — PLECANATIDE 3 MG PO TABS
3.0000 mg | ORAL_TABLET | Freq: Every day | ORAL | 5 refills | Status: DC
Start: 2016-02-06 — End: 2016-09-23

## 2016-02-06 NOTE — Telephone Encounter (Signed)
Communication noted.  

## 2016-02-06 NOTE — Telephone Encounter (Signed)
Rx sent in

## 2016-02-06 NOTE — Telephone Encounter (Signed)
Pt came by the office today, she stated she has been doing good until last night. Pt initially told me that she was coughing, then she said she started feeling like she had a little tickle in her throat and was trying to clear it. She said she noticed blood. She thought it was coming from her esophagus and not from her lungs but she wasn't sure. She is doing fine now, no pain, no difficulty swallowing, no fever, no more blood seen. Spoke with Dr.Rourk- pt should continue to monitor and take her medication and come back for follow up visit. Pt is already scheduled to come back. If it continues and she notices it is coming from coughing, she will contact her pcp.   Pt also wanted Minerva Areolaric to know that the trulance was working well and she wanted an rx sent to [facility].

## 2016-02-29 ENCOUNTER — Ambulatory Visit: Payer: Medicare HMO | Admitting: Obstetrics & Gynecology

## 2016-03-05 ENCOUNTER — Ambulatory Visit: Payer: Medicare HMO | Admitting: Obstetrics & Gynecology

## 2016-03-14 ENCOUNTER — Encounter: Payer: Self-pay | Admitting: Obstetrics & Gynecology

## 2016-03-14 ENCOUNTER — Encounter (INDEPENDENT_AMBULATORY_CARE_PROVIDER_SITE_OTHER): Payer: Self-pay

## 2016-03-14 ENCOUNTER — Ambulatory Visit (INDEPENDENT_AMBULATORY_CARE_PROVIDER_SITE_OTHER): Payer: Medicare HMO | Admitting: Obstetrics & Gynecology

## 2016-03-14 VITALS — BP 135/56 | HR 76 | Wt 197.0 lb

## 2016-03-14 DIAGNOSIS — B373 Candidiasis of vulva and vagina: Secondary | ICD-10-CM | POA: Diagnosis not present

## 2016-03-14 DIAGNOSIS — B3731 Acute candidiasis of vulva and vagina: Secondary | ICD-10-CM

## 2016-03-14 DIAGNOSIS — A6004 Herpesviral vulvovaginitis: Secondary | ICD-10-CM | POA: Diagnosis not present

## 2016-03-14 MED ORDER — FLUCONAZOLE 100 MG PO TABS: 100.0000 mg | ORAL_TABLET | Freq: Every day | ORAL | 0 refills | Status: DC

## 2016-03-14 MED ORDER — ACYCLOVIR 400 MG PO TABS: 400.0000 mg | ORAL_TABLET | Freq: Three times a day (TID) | ORAL | 2 refills | Status: DC

## 2016-03-14 NOTE — Addendum Note (Signed)
Addended by: Gaylyn RongEVANS, [place] Luddy A on: 03/14/2016 03:17 PM   Modules accepted: Orders

## 2016-03-14 NOTE — Progress Notes (Signed)
Chief Complaint  Patient presents with  . Urinary Incontinence    bottom burn when urine touch skin    Blood pressure (!) 135/56, pulse 76, weight 197 lb (89.4 kg).  70 y.o. G4P0 No LMP recorded. Patient has had a hysterectomy. The current method of family planning is status post hysterectomy.  Outpatient Encounter Prescriptions as of 03/14/2016  Medication Sig  . aspirin EC 81 MG EC tablet Take 2 tablets (162 mg total) by mouth daily.  . budesonide-formoterol (SYMBICORT) 160-4.5 MCG/ACT inhaler Inhale 2 puffs into the lungs 2 (two) times daily as needed (shortness of breath).   . Cholecalciferol (VITAMIN D-3) 5000 UNITS TABS Take 1 tablet by mouth daily.  . ciprofloxacin (CIPRO) 500 MG tablet Take 500 mg by mouth 2 (two) times daily.  Marland Kitchen DEXILANT 60 MG capsule TAKE (1) CAPSULE BY MOUTH ONCE DAILY.  Marland Kitchen diltiazem (TIAZAC) 360 MG 24 hr capsule Take 360 mg by mouth daily.  . dimethicone 1 % cream Apply 1 application topically 2 (two) times daily as needed for dry skin.  . furosemide (LASIX) 20 MG tablet Take 40 mg by mouth daily.  . Hydrocortisone (GERHARDT'S BUTT CREAM) CREA Apply 1 application topically 3 (three) times daily. (Patient taking differently: Apply 1 application topically 3 (three) times daily as needed for irritation. )  . imipramine (TOFRANIL) 25 MG tablet Take 25 mg by mouth as needed.   Marland Kitchen levothyroxine (SYNTHROID, LEVOTHROID) 125 MCG tablet Take 125 mcg by mouth daily before breakfast.  . oxybutynin (DITROPAN) 5 MG tablet TAKE (1) TABLET BY MOUTH THREE TIMES DAILY  . oxyCODONE-acetaminophen (PERCOCET/ROXICET) 5-325 MG tablet 1 tablet every 6 (six) hours as needed for moderate pain or severe pain.   . potassium chloride 20 MEQ TBCR Take 20 mEq by mouth daily.  . pravastatin (PRAVACHOL) 40 MG tablet Take 40 mg by mouth daily.  . ranitidine (ZANTAC) 150 MG tablet Take 150 mg by mouth at bedtime.   . solifenacin (VESICARE) 10 MG tablet Take 10 mg by mouth daily.  .  traZODone (DESYREL) 150 MG tablet Take 300 mg by mouth at bedtime.  . triamterene-hydrochlorothiazide (MAXZIDE-25) 37.5-25 MG per tablet Take 1 tablet by mouth daily.  Marland Kitchen acyclovir (ZOVIRAX) 400 MG tablet Take 1 tablet (400 mg total) by mouth 3 (three) times daily.  . fluconazole (DIFLUCAN) 100 MG tablet Take 1 tablet (100 mg total) by mouth daily.  . meclizine (ANTIVERT) 25 MG tablet Take 25 mg by mouth as needed for dizziness. Reported on 10/06/2015  . Plecanatide (TRULANCE) 3 MG TABS Take 3 mg by mouth daily. (Patient not taking: Reported on 03/14/2016)   No facility-administered encounter medications on file as of 03/14/2016.     Subjective Pt with recent problem with ulcers and intense burning when urine hits it    Objective Exam with >20 ulcers presnt, either HSV1 or really severe yeast HSV culture done  Pertinent ROS No burning with urination, frequency or urgency No nausea, vomiting or diarrhea Nor fever chills or other constitutional symptoms   Labs or studies pending    Impression Diagnoses this Encounter::   ICD-9-CM ICD-10-CM   1. Yeast vaginitis 112.1 B37.3   2. 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 ciprofloxacin (CIPRO) 500 MG tablet    Sig: Take 500 mg by mouth 2 (two) times daily.  . dimethicone 1 % cream    Sig:  Apply 1 application topically 2 (two) times daily as needed for dry skin.  . fluconazole (DIFLUCAN) 100 MG tablet    Sig: Take 1 tablet (100 mg total) by mouth daily.    Dispense:  30 tablet    Refill:  0  . acyclovir (ZOVIRAX) 400 MG tablet    Sig: Take 1 tablet (400 mg total) by mouth 3 (three) times daily.    Dispense:  90 tablet    Refill:  2    Labs or Scans Ordered: No orders of the defined types were placed in this encounter.   Management:: Not sure which it is, yeast or HSV, cultutre pending Treat with both diflucn and  acyclovir  Follow up Return in about 2 weeks (around 03/28/2016) for Follow up, with Dr Elonda Husky.        All questions were answered.  Past Medical History:  Diagnosis Date  . Anxiety   . Carpal tunnel syndrome   . COPD (chronic obstructive pulmonary disease) ([place])   . Gastric ulcer   . GERD (gastroesophageal reflux disease)   . Hyperglycemia   . Hyperlipidemia   . Hypertension   . Hypothyroidism   . Osteoarthritis   . Peptic ulcer disease   . Pneumonia 2010  . Renal insufficiency   . 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 Stroke ([place]) 1990s   mild  . TIA (transient ischemic attack)     Past Surgical History:  Procedure Laterality Date  . ABDOMINAL HYSTERECTOMY    . APPENDECTOMY    . BACK SURGERY    . BONE MARROW ASPIRATION Left 04/22/14  . BONE MARROW BIOPSY Left 04/22/14  . CHOLECYSTECTOMY    . COLONOSCOPY     ?2005, [place]  . COLONOSCOPY N/A 04/07/2015   [id number]  . COLONOSCOPY WITH ESOPHAGOGASTRODUODENOSCOPY (EGD)  02/11/2012   RMR: Ulcerative/erosive reflux esophagitis, benign appearing gastric ulcer with unremarkable biopsy, no H pylori. Colonoscopy was unremarkable. Next colonoscopy in 2023  . CYSTECTOMY     cyst removed from rectal area.   . ESOPHAGOGASTRODUODENOSCOPY  01/05/2003   RMR: Normal esophagus, small hiatal hernia/ A couple of tiny antral erosions, otherwise normal stomach normal D1 and D2.   . ESOPHAGOGASTRODUODENOSCOPY N/A 08/11/2014   [id number]:[id number] ulcer/HH  . ESOPHAGOGASTRODUODENOSCOPY N/A 12/01/2014   [id number]:[id number] improved gastric ulcerations/p bx  . ESOPHAGOGASTRODUODENOSCOPY N/A 01/31/2016   Procedure: ESOPHAGOGASTRODUODENOSCOPY (EGD);  Surgeon: Daneil Dolin, MD;  Location: [facility];  Service: Endoscopy;  Laterality: N/A;  745  . HEMORRHOID SURGERY    . JOINT REPLACEMENT Right    knee  . MALONEY DILATION N/A 01/31/2016   Procedure: Venia Minks DILATION;  Surgeon: Daneil Dolin, MD;  Location: [facility];  Service: Endoscopy;   Laterality: N/A;    OB History    Gravida Para Term Preterm AB Living   4         3   SAB TAB Ectopic Multiple Live Births                  Allergies  Allergen Reactions  . Tetracyclines & Related   . Shellfish-Derived Products Nausea And Vomiting    Social History   Social History  . Marital status: Married    Spouse name: N/A  . Number of children: 3  . Years of education: N/A   Social History Main Topics  . Smoking status: Current Some Day Smoker    Packs/day: 0.50    Types: Cigarettes  . Smokeless tobacco:  Never Used     Comment: Is trying to quit, actively cutting back  . Alcohol use No  . Drug use: No  . Sexual activity: No   Other Topics Concern  . None   Social History Narrative  . None    Family History  Problem Relation Age of Onset  . Other Mother     died age 57, natural causes  . Stroke Mother   . Diabetes Daughter   . Healthy Son   . Healthy Daughter   . Healthy Daughter   . Colon cancer Neg Hx   . Liver disease Neg Hx

## 2016-03-14 NOTE — Addendum Note (Signed)
Addended by: Federico FlakeNES, Tyson Parkison A on: 03/14/2016 12:37 PM   Modules accepted: Orders

## 2016-03-16 LAB — HERPES SIMPLEX VIRUS CULTURE

## 2016-03-28 ENCOUNTER — Ambulatory Visit (INDEPENDENT_AMBULATORY_CARE_PROVIDER_SITE_OTHER): Payer: Medicare HMO | Admitting: Obstetrics & Gynecology

## 2016-03-28 ENCOUNTER — Encounter: Payer: Self-pay | Admitting: Obstetrics & Gynecology

## 2016-03-28 VITALS — BP 116/63 | HR 66 | Wt 198.0 lb

## 2016-03-28 DIAGNOSIS — A6004 Herpesviral vulvovaginitis: Secondary | ICD-10-CM

## 2016-03-28 DIAGNOSIS — B373 Candidiasis of vulva and vagina: Secondary | ICD-10-CM

## 2016-03-28 DIAGNOSIS — B3731 Acute candidiasis of vulva and vagina: Secondary | ICD-10-CM

## 2016-03-28 MED ORDER — TRIAMCINOLONE ACETONIDE 0.5 % EX CREA: 1.0000 "application " | TOPICAL_CREAM | Freq: Three times a day (TID) | CUTANEOUS | 11 refills | Status: DC

## 2016-03-28 NOTE — Progress Notes (Signed)
Chief Complaint  Patient presents with  . Follow-up    Blood pressure 116/63, pulse 66, weight 198 lb (89.8 kg).  71 y.o. G4P0 No LMP recorded. Patient has had a hysterectomy. The current method of family planning is status post hysterectomy.  Outpatient Encounter Prescriptions as of 03/28/2016  Medication Sig  . acyclovir (ZOVIRAX) 400 MG tablet Take 1 tablet (400 mg total) by mouth 3 (three) times daily.  Marland Kitchen aspirin EC 81 MG EC tablet Take 2 tablets (162 mg total) by mouth daily.  . budesonide-formoterol (SYMBICORT) 160-4.5 MCG/ACT inhaler Inhale 2 puffs into the lungs 2 (two) times daily as needed (shortness of breath).   . Cholecalciferol (VITAMIN D-3) 5000 UNITS TABS Take 1 tablet by mouth daily.  . ciprofloxacin (CIPRO) 500 MG tablet Take 500 mg by mouth 2 (two) times daily.  Marland Kitchen DEXILANT 60 MG capsule TAKE (1) CAPSULE BY MOUTH ONCE DAILY.  Marland Kitchen diltiazem (TIAZAC) 360 MG 24 hr capsule Take 360 mg by mouth daily.  . dimethicone 1 % cream Apply 1 application topically 2 (two) times daily as needed for dry skin.  . fluconazole (DIFLUCAN) 100 MG tablet Take 1 tablet (100 mg total) by mouth daily.  . furosemide (LASIX) 20 MG tablet Take 40 mg by mouth daily.  . Hydrocortisone (GERHARDT'S BUTT CREAM) CREA Apply 1 application topically 3 (three) times daily. (Patient taking differently: Apply 1 application topically 3 (three) times daily as needed for irritation. )  . imipramine (TOFRANIL) 25 MG tablet Take 25 mg by mouth as needed.   Marland Kitchen levothyroxine (SYNTHROID, LEVOTHROID) 125 MCG tablet Take 125 mcg by mouth daily before breakfast.  . oxybutynin (DITROPAN) 5 MG tablet TAKE (1) TABLET BY MOUTH THREE TIMES DAILY  . oxyCODONE-acetaminophen (PERCOCET/ROXICET) 5-325 MG tablet 1 tablet every 6 (six) hours as needed for moderate pain or severe pain.   Marland Kitchen Plecanatide (TRULANCE) 3 MG TABS Take 3 mg by mouth daily.  . potassium chloride 20 MEQ TBCR Take 20 mEq by mouth daily.  . pravastatin  (PRAVACHOL) 40 MG tablet Take 40 mg by mouth daily.  . ranitidine (ZANTAC) 150 MG tablet Take 150 mg by mouth at bedtime.   . solifenacin (VESICARE) 10 MG tablet Take 10 mg by mouth daily.  . traZODone (DESYREL) 150 MG tablet Take 300 mg by mouth at bedtime.  . triamterene-hydrochlorothiazide (MAXZIDE-25) 37.5-25 MG per tablet Take 1 tablet by mouth daily.  . meclizine (ANTIVERT) 25 MG tablet Take 25 mg by mouth as needed for dizziness. Reported on 10/06/2015  . triamcinolone cream (KENALOG) 0.5 % Apply 1 application topically 3 (three) times daily.   No facility-administered encounter medications on file as of 03/28/2016.     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 Yeast vaginitis 112.1 B37.3     Established relevant diagnosis(es):   Plan/Recommendations: Meds ordered this encounter  Medications  . triamcinolone cream (KENALOG) 0.5 %    Sig: Apply 1 application topically 3 (three) times daily.    Dispense:  30 g    Refill:  11    Labs or  Scans Ordered: No orders of the defined types were placed in this encounter.   Management:: Lidex e when pt does get some occasional itching, not related to this most recent episode  Follow up Return if symptoms worsen or fail to improve.        Face to face time:  15 minutes  Greater than 50% of the visit time was spent in counseling and coordination of care with the patient.  The summary and outline of the counseling and care coordination is summarized in the note above.   All questions were answered.  Past Medical History:   Diagnosis Date  . Anxiety   . Carpal tunnel syndrome   . COPD (chronic obstructive pulmonary disease) ([place])   . Gastric ulcer   . GERD (gastroesophageal reflux disease)   . Hyperglycemia   . Hyperlipidemia   . Hypertension   . Hypothyroidism   . Osteoarthritis   . Peptic ulcer disease   . Pneumonia 2010  . Renal insufficiency   . 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 Stroke ([place]) 1990s   mild  . TIA (transient ischemic attack)     Past Surgical History:  Procedure Laterality Date  . ABDOMINAL HYSTERECTOMY    . APPENDECTOMY    . BACK SURGERY    . BONE MARROW ASPIRATION Left 04/22/14  . BONE MARROW BIOPSY Left 04/22/14  . CHOLECYSTECTOMY    . COLONOSCOPY     ?2005, [place]  . COLONOSCOPY N/A 04/07/2015   [id number]  . COLONOSCOPY WITH ESOPHAGOGASTRODUODENOSCOPY (EGD)  02/11/2012   RMR: Ulcerative/erosive reflux esophagitis, benign appearing gastric ulcer with unremarkable biopsy, no H pylori. Colonoscopy was unremarkable. Next colonoscopy in 2023  . CYSTECTOMY     cyst removed from rectal area.   . ESOPHAGOGASTRODUODENOSCOPY  01/05/2003   RMR: Normal esophagus, small hiatal hernia/ A couple of tiny antral erosions, otherwise normal stomach normal D1 and D2.   . ESOPHAGOGASTRODUODENOSCOPY N/A 08/11/2014   FIE:[id number] ulcer/HH  . ESOPHAGOGASTRODUODENOSCOPY N/A 12/01/2014   [id number] improved gastric ulcerations/p bx  . ESOPHAGOGASTRODUODENOSCOPY N/A 01/31/2016   Procedure: ESOPHAGOGASTRODUODENOSCOPY (EGD);  Surgeon: Daneil Dolin, MD;  [facility];  Service: Endoscopy;  Laterality: N/A;  745  . HEMORRHOID SURGERY    . JOINT REPLACEMENT Right    knee  . MALONEY DILATION N/A 01/31/2016   Procedure: Venia Minks DILATION;  Surgeon: Daneil Dolin, MD;  Location: [facility];  Service: Endoscopy;  Laterality: N/A;    OB History    Gravida Para Term Preterm AB Living   4         3   SAB TAB Ectopic Multiple Live Births                  Allergies    Allergen Reactions  . Tetracyclines & Related   . Shellfish-Derived Products Nausea And Vomiting    Social History   Social History  . Marital status: Married    Spouse name: N/A  . Number of children: 3  . Years of education: N/A   Social History Main Topics  . Smoking status: Current Some Day Smoker    Packs/day: 0.50    Types: Cigarettes  . Smokeless tobacco: Never Used     Comment: Is trying to quit, actively cutting back  . Alcohol use No  . Drug use: No  . Sexual activity: No   Other Topics Concern  . None   Social History Narrative  . None  Family History  Problem Relation Age of Onset  . Other Mother     died age 16, natural causes  . Stroke Mother   . Diabetes Daughter   . Healthy Son   . Healthy Daughter   . Healthy Daughter   . Colon cancer Neg Hx   . Liver disease Neg Hx

## 2016-04-24 ENCOUNTER — Ambulatory Visit (INDEPENDENT_AMBULATORY_CARE_PROVIDER_SITE_OTHER): Payer: Medicare HMO | Admitting: Nurse Practitioner

## 2016-04-24 ENCOUNTER — Encounter: Payer: Self-pay | Admitting: Nurse Practitioner

## 2016-04-24 VITALS — BP 134/76 | HR 70 | Temp 98.2°F | Ht 64.0 in | Wt 198.6 lb

## 2016-04-24 DIAGNOSIS — K5900 Constipation, unspecified: Secondary | ICD-10-CM | POA: Diagnosis not present

## 2016-04-24 DIAGNOSIS — R1310 Dysphagia, unspecified: Secondary | ICD-10-CM | POA: Diagnosis not present

## 2016-04-24 NOTE — Assessment & Plan Note (Signed)
Patient's symptoms resolved after endoscopy with dilation. Continue to monitor. Return for follow-up in 6 months. Call us if any worsening or recurrent symptoms before then.

## 2016-04-24 NOTE — Assessment & Plan Note (Signed)
Constipation much improved/essentially resolved on Trulance. Recommend she continue taking Trulance, return for follow-up in 6 months. She can return or call us for any worsening or recurrent symptoms.

## 2016-04-24 NOTE — Patient Instructions (Signed)
1. Keep taking Trulance for constipation. 2. Return for follow-up in 6 months. 3. Call us if you have any worsening or recurrent symptoms.

## 2016-04-24 NOTE — Progress Notes (Signed)
Referring Provider: [name], NP Primary Care Physician:  [name], NP Primary GI:  Dr. Gala Romney  Chief Complaint  Patient presents with  . Constipation  . Dysphagia    HPI:   Connie Ponce is a 71 y.o. female who presents For follow-up on constipation and dysphagia. The patient was last seen in our office Lennie Odor 10/05/2015 at which point it was noted she is doing well overall, taking Trulance occasionally and having large bowel movements. She wasn't aware to take a daily. Reinforce instructions to take it every day. Also noted dysphagia symptoms 3 weeks ago had a modified barium swallow study even a during recent hospital physician for TIA with results stating patient may benefit from GI consult. No other overt GI symptoms. Recommended Trulance every day, call with a progress report in 1-2 weeks, schedule upper endoscopy with possible dilation. Progress report related to Trulance stated it was working well and a prescription was sent to her pharmacy.  Upper endoscopy completed 01/31/2016 which found low-grade Schatzki's ring status post dilation, small hiatal hernia, erythematous gastric mucosa status post biopsy, single duodenal ulcer. Recommend continue present medications including Dexilant 60 mg daily, avoid NSAIDs, no repeat upper endoscopy necessary for surveillance. Surgical pathology as per below.  Today she states she's doing well. Dysphagia resolved after EGD/dilation. Constipation resolved on Trulance, typically 1-2 BMs a day, no straining. Denies abdominal pain, N/V, hematochezia, melena. Denies chest pain, dyspnea, dizziness, lightheadedness, syncope, near syncope. Denies any other upper or lower GI symptoms.    Past Medical History:  Diagnosis Date  . Anxiety   . Carpal tunnel syndrome   . COPD (chronic obstructive pulmonary disease) ([place])   . Gastric ulcer   . GERD (gastroesophageal reflux disease)   . Hyperglycemia   . Hyperlipidemia   . Hypertension    . Hypothyroidism   . Osteoarthritis   . Peptic ulcer disease   . Pneumonia 2010  . Renal insufficiency   . 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 Stroke ([place]) 1990s   mild  . TIA (transient ischemic attack)     Past Surgical History:  Procedure Laterality Date  . ABDOMINAL HYSTERECTOMY    . APPENDECTOMY    . BACK SURGERY    . BONE MARROW ASPIRATION Left 04/22/14  . BONE MARROW BIOPSY Left 04/22/14  . CHOLECYSTECTOMY    . COLONOSCOPY     ?2005, [place]  . COLONOSCOPY N/A 04/07/2015   [id number]  . COLONOSCOPY WITH ESOPHAGOGASTRODUODENOSCOPY (EGD)  02/11/2012   RMR: Ulcerative/erosive reflux esophagitis, benign appearing gastric ulcer with unremarkable biopsy, no H pylori. Colonoscopy was unremarkable. Next colonoscopy in 2023  . CYSTECTOMY     cyst removed from rectal area.   . ESOPHAGOGASTRODUODENOSCOPY  01/05/2003   RMR: Normal esophagus, small hiatal hernia/ A couple of tiny antral erosions, otherwise normal stomach normal D1 and D2.   . ESOPHAGOGASTRODUODENOSCOPY N/A 08/11/2014   [id number]:[id number] ulcer/HH  . ESOPHAGOGASTRODUODENOSCOPY N/A 12/01/2014   [id number] improved gastric ulcerations/p bx  . ESOPHAGOGASTRODUODENOSCOPY N/A 01/31/2016   Procedure: ESOPHAGOGASTRODUODENOSCOPY (EGD);  Surgeon: Daneil Dolin, MD;  [facility];  Service: Endoscopy;  Laterality: N/A;  745  . HEMORRHOID SURGERY    . JOINT REPLACEMENT Right    knee  . MALONEY DILATION N/A 01/31/2016   Procedure: Venia Minks DILATION;  Surgeon: Daneil Dolin, MD;  [facility];  Service: Endoscopy;  Laterality: N/A;    Current Outpatient Prescriptions  Medication Sig Dispense Refill  .  acyclovir (ZOVIRAX) 400 MG tablet Take 1 tablet (400 mg total) by mouth 3 (three) times daily. 90 tablet 2  . aspirin EC 81 MG EC tablet Take 2 tablets (162 mg total) by mouth daily. 60 tablet 0  . budesonide-formoterol (SYMBICORT) 160-4.5 MCG/ACT inhaler Inhale 2 puffs into the lungs 2 (two) times  daily as needed (shortness of breath).     . Cholecalciferol (VITAMIN D-3) 5000 UNITS TABS Take 1 tablet by mouth daily.    Marland Kitchen DEXILANT 60 MG capsule TAKE (1) CAPSULE BY MOUTH ONCE DAILY. 28 capsule 11  . diltiazem (TIAZAC) 360 MG 24 hr capsule Take 360 mg by mouth daily.    . dimethicone 1 % cream Apply 1 application topically 2 (two) times daily as needed for dry skin.    . furosemide (LASIX) 20 MG tablet Take 40 mg by mouth daily.    . Hydrocortisone (GERHARDT'S BUTT CREAM) CREA Apply 1 application topically 3 (three) times daily. (Patient taking differently: Apply 1 application topically 3 (three) times daily as needed for irritation. ) 1 each 11  . imipramine (TOFRANIL) 25 MG tablet Take 25 mg by mouth as needed.     Marland Kitchen levothyroxine (SYNTHROID, LEVOTHROID) 125 MCG tablet Take 125 mcg by mouth daily before breakfast.    . meclizine (ANTIVERT) 25 MG tablet Take 25 mg by mouth as needed for dizziness. Reported on 10/06/2015    . oxybutynin (DITROPAN) 5 MG tablet TAKE (1) TABLET BY MOUTH THREE TIMES DAILY 84 tablet 11  . oxyCODONE-acetaminophen (PERCOCET/ROXICET) 5-325 MG tablet 1 tablet every 6 (six) hours as needed for moderate pain or severe pain.     Marland Kitchen Plecanatide (TRULANCE) 3 MG TABS Take 3 mg by mouth daily. 30 tablet 5  . potassium chloride 20 MEQ TBCR Take 20 mEq by mouth daily. 30 tablet 0  . pravastatin (PRAVACHOL) 40 MG tablet Take 40 mg by mouth daily.    . ranitidine (ZANTAC) 150 MG tablet Take 150 mg by mouth at bedtime.     . solifenacin (VESICARE) 10 MG tablet Take 10 mg by mouth daily.    . traZODone (DESYREL) 150 MG tablet Take 300 mg by mouth at bedtime.    . triamcinolone cream (KENALOG) 0.5 % Apply 1 application topically 3 (three) times daily. 30 g 11  . triamterene-hydrochlorothiazide (MAXZIDE-25) 37.5-25 MG per tablet Take 1 tablet by mouth daily.     No current facility-administered medications for this visit.     Allergies as of 04/24/2016 - Review Complete 04/24/2016   Allergen Reaction Noted  . Tetracyclines & related  08/21/2015  . Shellfish-derived products Nausea And Vomiting 07/15/2015    Family History  Problem Relation Age of Onset  . Other Mother     died age 36, natural causes  . Stroke Mother   . Diabetes Daughter   . Healthy Son   . Healthy Daughter   . Healthy Daughter   . Colon cancer Neg Hx   . Liver disease Neg Hx     Social History   Social History  . Marital status: Married    Spouse name: N/A  . Number of children: 3  . Years of education: N/A   Social History Main Topics  . Smoking status: Current Some Day Smoker    Packs/day: 0.25    Types: Cigarettes  . Smokeless tobacco: Never Used     Comment: Is trying to quit, actively cutting back  . Alcohol use No  . Drug use: No  .  Sexual activity: No   Other Topics Concern  . None   Social History Narrative  . None    Review of Systems: General: Negative for anorexia, weight loss, fever, chills, fatigue, weakness. ENT: Negative for hoarseness, difficulty swallowing. CV: Negative for chest pain, angina, palpitations, peripheral edema.  Respiratory: Negative for dyspnea at rest, cough, sputum, wheezing.  GI: See history of present illness. Endo: Negative for unusual weight change.  Heme: Negative for bruising or bleeding.   Physical Exam: BP 134/76   Pulse 70   Temp 98.2 F (36.8 C) (Oral)   Ht 5' 4"  (1.626 m)   Wt 198 lb 9.6 oz (90.1 kg)   BMI 34.09 kg/m  General:   Obese female. Alert and oriented. Pleasant and cooperative. Well-nourished and well-developed.  Ears:  Normal auditory acuity. Cardiovascular:  S1, S2 present without murmurs appreciated. Extremities without clubbing or edema. Respiratory:  Clear to auscultation bilaterally. No wheezes, rales, or rhonchi. No distress.  Gastrointestinal:  +BS, rounded but soft, non-tender and non-distended. No HSM noted. No guarding or rebound. No masses appreciated.  Rectal:  Deferred  Musculoskalatal:   Symmetrical without gross deformities. Neurologic:  Alert and oriented x4;  grossly normal neurologically. Psych:  Alert and cooperative. Normal mood and affect. Heme/Lymph/Immune: No excessive bruising noted.    04/24/2016 10:50 AM   Disclaimer: This note was dictated with voice recognition software. Similar sounding words can inadvertently be transcribed and may not be corrected upon review.

## 2016-04-25 NOTE — Progress Notes (Signed)
cc'ed to pcp °

## 2016-05-11 ENCOUNTER — Other Ambulatory Visit: Payer: Self-pay | Admitting: Gastroenterology

## 2016-07-03 ENCOUNTER — Other Ambulatory Visit (HOSPITAL_COMMUNITY): Payer: Self-pay | Admitting: *Deleted

## 2016-07-03 DIAGNOSIS — D696 Thrombocytopenia, unspecified: Secondary | ICD-10-CM

## 2016-07-05 ENCOUNTER — Encounter (HOSPITAL_BASED_OUTPATIENT_CLINIC_OR_DEPARTMENT_OTHER): Payer: Medicare HMO | Admitting: Adult Health

## 2016-07-05 ENCOUNTER — Encounter (HOSPITAL_COMMUNITY): Payer: Medicare HMO | Attending: Oncology

## 2016-07-05 ENCOUNTER — Encounter (HOSPITAL_COMMUNITY): Payer: Self-pay | Admitting: Adult Health

## 2016-07-05 ENCOUNTER — Ambulatory Visit (HOSPITAL_COMMUNITY): Payer: Medicare HMO | Admitting: Hematology & Oncology

## 2016-07-05 VITALS — BP 123/56 | HR 67 | Temp 97.4°F | Resp 18 | Ht 64.0 in | Wt 199.0 lb

## 2016-07-05 DIAGNOSIS — D696 Thrombocytopenia, unspecified: Secondary | ICD-10-CM | POA: Diagnosis present

## 2016-07-05 DIAGNOSIS — K5900 Constipation, unspecified: Secondary | ICD-10-CM | POA: Diagnosis not present

## 2016-07-05 DIAGNOSIS — Z720 Tobacco use: Secondary | ICD-10-CM

## 2016-07-05 DIAGNOSIS — D649 Anemia, unspecified: Secondary | ICD-10-CM | POA: Diagnosis not present

## 2016-07-05 LAB — COMPREHENSIVE METABOLIC PANEL
ALT: 12 U/L — ABNORMAL LOW (ref 14–54)
ANION GAP: 8 (ref 5–15)
AST: 15 U/L (ref 15–41)
Albumin: 3.8 g/dL (ref 3.5–5.0)
Alkaline Phosphatase: 76 U/L (ref 38–126)
BUN: 29 mg/dL — ABNORMAL HIGH (ref 6–20)
CHLORIDE: 100 mmol/L — AB (ref 101–111)
CO2: 29 mmol/L (ref 22–32)
Calcium: 9.7 mg/dL (ref 8.9–10.3)
Creatinine, Ser: 1.21 mg/dL — ABNORMAL HIGH (ref 0.44–1.00)
GFR, EST AFRICAN AMERICAN: 50 mL/min — AB (ref >60)
GFR, EST NON AFRICAN AMERICAN: 43 mL/min — AB (ref >60)
Glucose, Bld: 105 mg/dL — ABNORMAL HIGH (ref 65–99)
POTASSIUM: 3.6 mmol/L (ref 3.5–5.1)
Sodium: 137 mmol/L (ref 135–145)
TOTAL PROTEIN: 6.9 g/dL (ref 6.5–8.1)
Total Bilirubin: 0.4 mg/dL (ref 0.3–1.2)

## 2016-07-05 LAB — CBC WITH DIFFERENTIAL/PLATELET
BASOS ABS: 0 10*3/uL (ref 0.0–0.1)
Basophils Relative: 0 %
EOS PCT: 5 %
Eosinophils Absolute: 0.4 10*3/uL (ref 0.0–0.7)
HCT: 39 % (ref 36.0–46.0)
Hemoglobin: 13.3 g/dL (ref 12.0–15.0)
LYMPHS PCT: 17 %
Lymphs Abs: 1.2 10*3/uL (ref 0.7–4.0)
MCH: 31.4 pg (ref 26.0–34.0)
MCHC: 34.1 g/dL (ref 30.0–36.0)
MCV: 92.2 fL (ref 78.0–100.0)
MONO ABS: 0.5 10*3/uL (ref 0.1–1.0)
Monocytes Relative: 7 %
Neutro Abs: 4.9 10*3/uL (ref 1.7–7.7)
Neutrophils Relative %: 71 %
PLATELETS: 196 10*3/uL (ref 150–400)
RBC: 4.23 MIL/uL (ref 3.87–5.11)
RDW: 12.2 % (ref 11.5–15.5)
WBC: 6.9 10*3/uL (ref 4.0–10.5)

## 2016-07-05 LAB — RETICULOCYTES
RBC.: 4.23 MIL/uL (ref 3.87–5.11)
Retic Count, Absolute: 71.9 10*3/uL (ref 19.0–186.0)
Retic Ct Pct: 1.7 % (ref 0.4–3.1)

## 2016-07-05 LAB — FERRITIN: FERRITIN: 28 ng/mL (ref 11–307)

## 2016-07-05 LAB — VITAMIN B12: Vitamin B-12: 325 pg/mL (ref 180–914)

## 2016-07-05 LAB — IRON AND TIBC
IRON: 49 ug/dL (ref 28–170)
SATURATION RATIOS: 14 % (ref 10.4–31.8)
TIBC: 351 ug/dL (ref 250–450)
UIBC: 302 ug/dL

## 2016-07-05 LAB — FOLATE: Folate: 11.3 ng/mL (ref >5.9)

## 2016-07-05 NOTE — Progress Notes (Signed)
[facility] [address] [place] [address]   CLINIC:  Medical Oncology/Hematology  PCP:  [name], NP [address] [place] [address] [phone]   REASON FOR VISIT:  Follow-up for Thrombocytopenia and anemia   CURRENT THERAPY: Observation     HISTORY OF PRESENT ILLNESS:  (From Kirby Crigler, PA-C's last note on 07/04/15)     INTERVAL HISTORY:  Connie Ponce 71 y.o. female returns for follow-up of thrombocytopenia and anemia.   Overall, she reports feeling well. Appetite levels 75% of his baseline. Energy levels are 50% of baseline. Denies any blood in her stool/dark or tarry stools, hematuria, vaginal bleeding, nosebleeds, or gingival bleeding.    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 Recently underwent EGD with dilation for dysphagia; her swallowing symptoms are improved are now improved.  Endorses constipation; she is not sure what she should take for constipation.      REVIEW OF SYSTEMS:  Review of Systems  Constitutional: Positive for fatigue.  HENT:   Positive for trouble swallowing (Improved; recently had EGD with esophageal dilation with Dr. Gala Romney).   Eyes: Negative.   Respiratory: Negative.   Cardiovascular: Positive for leg swelling (chronic since back surgery).  Gastrointestinal: Positive for constipation. Negative for abdominal pain, blood in stool, diarrhea, nausea and vomiting.  Endocrine: Negative.   Genitourinary: Negative for dysuria, hematuria and vaginal bleeding.   Skin: Negative.  Negative for rash.  Neurological: Positive for numbness (peripheral neuropathy since back surgery).  Hematological: Negative.   Psychiatric/Behavioral: Negative.      PAST MEDICAL/SURGICAL HISTORY:  Past Medical History:  Diagnosis Date  . Anxiety   . Carpal tunnel syndrome   . COPD (chronic obstructive pulmonary disease) ([place])   . Gastric ulcer   . GERD  (gastroesophageal reflux disease)   . Hyperglycemia   . Hyperlipidemia   . Hypertension   . Hypothyroidism   . Osteoarthritis   . Peptic ulcer disease   . Pneumonia 2010  . Renal insufficiency   . 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 Stroke ([place]) 1990s   mild  . TIA (transient ischemic attack)    Past Surgical History:  Procedure Laterality Date  . ABDOMINAL HYSTERECTOMY    . APPENDECTOMY    . BACK SURGERY    . BONE MARROW ASPIRATION Left 04/22/14  . BONE MARROW BIOPSY Left 04/22/14  . CHOLECYSTECTOMY    . COLONOSCOPY     ?2005, [place]  . COLONOSCOPY N/A 04/07/2015   LKG:[id number]  . COLONOSCOPY WITH ESOPHAGOGASTRODUODENOSCOPY (EGD)  02/11/2012   RMR: Ulcerative/erosive reflux esophagitis, benign appearing gastric ulcer with unremarkable biopsy, no H pylori. Colonoscopy was unremarkable. Next colonoscopy in 2023  . CYSTECTOMY     cyst removed from rectal area.   . ESOPHAGOGASTRODUODENOSCOPY  01/05/2003   RMR: Normal esophagus, small hiatal hernia/ A couple of tiny antral erosions, otherwise normal stomach normal D1 and D2.   . ESOPHAGOGASTRODUODENOSCOPY N/A 08/11/2014   OZD:GUYQIHK ulcer/HH  . ESOPHAGOGASTRODUODENOSCOPY N/A 12/01/2014   VQQ:[id number] improved gastric ulcerations/p bx  . ESOPHAGOGASTRODUODENOSCOPY N/A 01/31/2016   Procedure: ESOPHAGOGASTRODUODENOSCOPY (EGD);  Surgeon: Daneil Dolin, MD;  [facility];  Service: Endoscopy;  Laterality: N/A;  745  . HEMORRHOID SURGERY    . JOINT REPLACEMENT Right    knee  . MALONEY DILATION N/A 01/31/2016   Procedure: Venia Minks DILATION;  Surgeon: Daneil Dolin, MD;  Location: [facility];  Service: Endoscopy;  Laterality: N/A;     SOCIAL HISTORY:  Social History   Social History  . Marital status: Married    Spouse name: N/A  . Number of children: 3  . Years of education: N/A   Occupational History  . Not on file.   Social History Main Topics  . Smoking status: Current Some Day Smoker     Packs/day: 0.25    Types: Cigarettes  . Smokeless tobacco: Never Used     Comment: Is trying to quit, actively cutting back  . Alcohol use No  . Drug use: No  . Sexual activity: No   Other Topics Concern  . Not on file   Social History Narrative  . No narrative on file    FAMILY HISTORY:  Family History  Problem Relation Age of Onset  . Other Mother     died age 4, natural causes  . Stroke Mother   . Diabetes Daughter   . Healthy Son   . Healthy Daughter   . Healthy Daughter   . Colon cancer Neg Hx   . Liver disease Neg Hx     CURRENT MEDICATIONS:  Outpatient Encounter Prescriptions as of 07/05/2016  Medication Sig  . acyclovir (ZOVIRAX) 400 MG tablet Take 1 tablet (400 mg total) by mouth 3 (three) times daily.  Marland Kitchen aspirin EC 81 MG EC tablet Take 2 tablets (162 mg total) by mouth daily.  . budesonide-formoterol (SYMBICORT) 160-4.5 MCG/ACT inhaler Inhale 2 puffs into the lungs 2 (two) times daily as needed (shortness of breath).   . carvedilol (COREG) 12.5 MG tablet   . Cholecalciferol (VITAMIN D-3) 5000 UNITS TABS Take 1 tablet by mouth daily.  Marland Kitchen DEXILANT 60 MG capsule TAKE (1) CAPSULE BY MOUTH ONCE DAILY.  Marland Kitchen dimethicone 1 % cream Apply 1 application topically 2 (two) times daily as needed for dry skin.  . furosemide (LASIX) 20 MG tablet Take 40 mg by mouth daily.  Marland Kitchen imipramine (TOFRANIL) 25 MG tablet Take 25 mg by mouth as needed.   Marland Kitchen levothyroxine (SYNTHROID, LEVOTHROID) 125 MCG tablet Take 125 mcg by mouth daily before breakfast.  . meclizine (ANTIVERT) 25 MG tablet Take 25 mg by mouth as needed for dizziness. Reported on 10/06/2015  . oxybutynin (DITROPAN) 5 MG tablet TAKE (1) TABLET BY MOUTH THREE TIMES DAILY  . oxyCODONE-acetaminophen (PERCOCET/ROXICET) 5-325 MG tablet 1 tablet every 6 (six) hours as needed for moderate pain or severe pain.   Marland Kitchen Plecanatide (TRULANCE) 3 MG TABS Take 3 mg by mouth daily.  . potassium chloride (K-DUR) 10 MEQ tablet   . potassium  chloride 20 MEQ TBCR Take 20 mEq by mouth daily.  . pravastatin (PRAVACHOL) 40 MG tablet Take 40 mg by mouth daily.  . ranitidine (ZANTAC) 150 MG tablet Take 150 mg by mouth at bedtime.   . sertraline (ZOLOFT) 50 MG tablet   . traZODone (DESYREL) 150 MG tablet Take 300 mg by mouth at bedtime.  . triamcinolone cream (KENALOG) 0.5 % Apply 1 application topically 3 (three) times daily.  Marland Kitchen triamterene-hydrochlorothiazide (MAXZIDE-25) 37.5-25 MG per tablet Take 1 tablet by mouth daily.  . [DISCONTINUED] diltiazem (TIAZAC) 360 MG 24 hr capsule Take 360 mg by mouth daily.  . [DISCONTINUED] Hydrocortisone (GERHARDT'S BUTT CREAM) CREA Apply 1 application topically 3 (three) times daily. (Patient taking differently: Apply 1 application topically 3 (three) times daily as needed for irritation. )  . [DISCONTINUED] solifenacin (VESICARE) 10 MG tablet Take 10 mg by mouth daily.  No facility-administered encounter medications on file as of 07/05/2016.     ALLERGIES:  Allergies  Allergen Reactions  . Tetracyclines & Related   . Shellfish-Derived Products Nausea And Vomiting     PHYSICAL EXAM:  ECOG Performance status: 0 - Asymptomatic  Vitals:   07/05/16 1042  BP: (!) 123/56  Pulse: 67  Resp: 18  Temp: 97.4 F (36.3 C)   Filed Weights   07/05/16 1042  Weight: 199 lb (90.3 kg)    Physical Exam  Constitutional: She is oriented to person, place, and time and well-developed, well-nourished, and in no distress.  HENT:  Head: Normocephalic.  Mouth/Throat: Oropharynx is clear and moist. No oropharyngeal exudate.  Eyes: Conjunctivae are normal. Pupils are equal, round, and reactive to light. No scleral icterus.  Neck: Normal range of motion. Neck supple.  Cardiovascular: Normal rate, regular rhythm and normal heart sounds.   Pulmonary/Chest: Effort normal and breath sounds normal. No respiratory distress.  Abdominal: Soft. Bowel sounds are normal. There is no tenderness.  Musculoskeletal:  Normal range of motion. She exhibits no edema.  Lymphadenopathy:    She has no cervical adenopathy.       Right: No supraclavicular adenopathy present.       Left: No supraclavicular adenopathy present.  Neurological: She is alert and oriented to person, place, and time. No cranial nerve deficit. Gait normal.  Skin: Skin is warm and dry. No rash noted.  Psychiatric: Mood, memory, affect and judgment normal.  Nursing note and vitals reviewed.    LABORATORY DATA:  I have reviewed the labs as listed.  CBC    Component Value Date/Time   WBC 6.9 07/05/2016 0918   RBC 4.23 07/05/2016 0918   RBC 4.23 07/05/2016 0918   HGB 13.3 07/05/2016 0918   HCT 39.0 07/05/2016 0918   PLT 196 07/05/2016 0918   MCV 92.2 07/05/2016 0918   MCH 31.4 07/05/2016 0918   MCHC 34.1 07/05/2016 0918   RDW 12.2 07/05/2016 0918   LYMPHSABS 1.2 07/05/2016 0918   MONOABS 0.5 07/05/2016 0918   EOSABS 0.4 07/05/2016 0918   BASOSABS 0.0 07/05/2016 0918   CMP Latest Ref Rng & Units 07/05/2016 01/04/2016 01/03/2016  Glucose 65 - 99 mg/dL 105(H) 86 101(H)  BUN 6 - 20 mg/dL 29(H) 15 24(H)  Creatinine 0.44 - 1.00 mg/dL 1.21(H) 0.99 1.41(H)  Sodium 135 - 145 mmol/L 137 139 135  Potassium 3.5 - 5.1 mmol/L 3.6 3.5 3.4(L)  Chloride 101 - 111 mmol/L 100(L) 107 102  CO2 22 - 32 mmol/L 29 25 25   Calcium 8.9 - 10.3 mg/dL 9.7 9.2 9.2  Total Protein 6.5 - 8.1 g/dL 6.9 - 6.6  Total Bilirubin 0.3 - 1.2 mg/dL 0.4 - 0.3  Alkaline Phos 38 - 126 U/L 76 - 91  AST 15 - 41 U/L 15 - 17  ALT 14 - 54 U/L 12(L) - 11(L)    PENDING LABS:    DIAGNOSTIC IMAGING:    PATHOLOGY:     ASSESSMENT & PLAN:   Thrombocytopenia/Anemia:  -Platelets normal today at 196,000. Her platelet count has actually been normal for 1 year.  -Hemoglobin normal at 13.3 g/dL today. Hemoglobin has also been stable and normal for 1 year.  -Discussed that we will collect labs in 6 months and have her return to cancer center in 1 year with labs; if her  blood counts remain normal/stable, then we could consider discharging her from follow-up at that time. She is agreeable to this plan.   -  Anemia panel results pending.    Constipation:  -Recommended OTC Miralax once daily.   Smoking cessation:  -We discussed the pathophysiology of nicotine dependence and different strategies to stop smoking. The gold standard of tobacco cessation is nicotine replacement (with nicotine patches & gum/lozenges) or Varenicline (Chantix).  She did well with nicotine patches in the past when she was hospitalized and quit smoking for about 3 months after being discharged home.  We discussed that the typical craving/urge to smoke lasts about 3-5 minutes; her biggest trigger(s) to use cigarettes is riding in the car and after meals.  Encouraged her to set some new "rules" that do not allow her to smoke in the car; instead she could use a piece of nicotine gum or a lozenge when she has a craving. I gave her instructions on how to use these nicotine replacement products.   ConnieVidrine  understands that data suggests that "cold [place]" is the least effective way to stop using tobacco products.  Having a clear "quit plan" is much more effective and requires a step-wise approach with continued support from a tobacco treatment specialist.  Also recommended she contact 1-800-QUIT-NOW for possible free nicotine replacement products and coaching. I encouraged her to reach out to me if she has further questions or interest in her continued cessation efforts.  Greater than 10 minutes was spent in smoking cessation counseling with this patient.     Health maintenance/Wellness:  -She is up-to-date with mammogram and colonoscopy. She has received age appropriate vaccinations, with the exception of the shingles vaccine; recommended she talk with her PCP to see if they think she would be a good candidate for this vaccine.  -Encouraged healthy diet and exercise.      Dispo:  -Labs only in 6  months.  -Return to cancer center in 1 year with labs. If labs remain stable, then can consider discharge from clinic at that time given long-term stability to lab studies.    All questions were answered to patient's stated satisfaction. Encouraged patient to call with any new concerns or questions before her next visit to the cancer center and we can certain see her sooner, if needed.    Plan of care discussed with Dr. Irene Limbo, who agrees with the above aforementioned.     Orders placed this encounter:  Orders Placed This Encounter  Procedures  . CBC with Differential/Platelet  . Comprehensive metabolic panel  . Vitamin B12  . Folate  . Iron and TIBC  . Ferritin      Mike Craze, NP [place] [phone]

## 2016-07-05 NOTE — Patient Instructions (Addendum)
[facility] at [facility] Discharge Instructions  RECOMMENDATIONS MADE BY THE CONSULTANT AND ANY TEST RESULTS WILL BE SENT TO YOUR REFERRING PHYSICIAN.  You were seen today by Lubertha Basque NP. Return in 6 months for labs. Return in 1 year for labs and follow up.    Thank you for choosing [facility] at [facility] to provide your oncology and hematology care.  To afford each patient quality time with our provider, please arrive at least 15 minutes before your scheduled appointment time.    If you have a lab appointment with the Cancer Center please come in thru the  Main Entrance and check in at the main information desk  You need to re-schedule your appointment should you arrive 10 or more minutes late.  We strive to give you quality time with our providers, and arriving late affects you and other patients whose appointments are after yours.  Also, if you no show three or more times for appointments you may be dismissed from the clinic at the providers discretion.     Again, thank you for choosing [facility].  Our hope is that these requests will decrease the amount of time that you wait before being seen by our physicians.       _____________________________________________________________  Should you have questions after your visit to [facility], please contact our office at [phone] between the hours of 8:30 a.m. and 4:30 p.m.  Voicemails left after 4:30 p.m. will not be returned until the following business day.  For prescription refill requests, have your pharmacy contact our office.       Resources For Cancer Patients and their Caregivers ? American Cancer Society: Can assist with transportation, wigs, general needs, runs Look Good Feel Better.        (248) 193-1407 ? Cancer Care: Provides financial assistance, online support groups, medication/co-pay assistance.  1-800-813-HOPE 732 678 3324) ? [facility] Assists [place] cancer patients and their families through emotional , educational and financial support.  (416)710-7075 ? [place] DSS Where to apply for food stamps, Medicaid and utility assistance. 470-708-8917 ? [facility]: Transportation to medical appointments. 548-696-8232 ? Social Security Administration: May apply for disability if have a Stage IV cancer. (507)535-7137 234 459 8618 ? [employer], Disability and Transit Services: Assists with nutrition, care and transit needs. (626)315-2231  Cancer Center Support Programs: @ > Cancer Support Group  2nd Tuesday of the month 1pm-2pm, Journey Room  > Creative Journey  3rd Tuesday of the month 1130am-1pm, Journey Room  > Look Good Feel Better  1st Wednesday of the month 10am-12 noon, Journey Room (Call American Cancer Society to register [phone])

## 2016-09-23 ENCOUNTER — Encounter: Payer: Self-pay | Admitting: Gastroenterology

## 2016-09-23 ENCOUNTER — Ambulatory Visit (INDEPENDENT_AMBULATORY_CARE_PROVIDER_SITE_OTHER): Payer: Medicare HMO | Admitting: Gastroenterology

## 2016-09-23 VITALS — BP 108/62 | HR 57 | Temp 96.8°F | Ht 64.0 in | Wt 194.0 lb

## 2016-09-23 DIAGNOSIS — K5900 Constipation, unspecified: Secondary | ICD-10-CM

## 2016-09-23 MED ORDER — NALOXEGOL OXALATE 25 MG PO TABS: 25.0000 mg | ORAL_TABLET | Freq: Every day | ORAL | 3 refills | Status: DC

## 2016-09-23 NOTE — Patient Instructions (Signed)
1. Stop Trulance.  2. Start Movantik 25mg  daily, take one hour before or 2 hours after a meal. Samples provided. RX also sent to your pharmacy.  3. If you continue to have problems with your appetite OR you have more than a 5 pound unintentional weight loss, please let me know.  4. See you back in the office in 4 months.

## 2016-09-23 NOTE — Assessment & Plan Note (Signed)
Constipation poorly controlled. Likely opioid induced. Stop Trulance. Try Movantik 25mg  daily. Samples and RX provided.   Discussed with patient, if her appetite is not improve with better management of her constipation she has further weight loss greater than 5 pounds, she is to let me know. Otherwise we'll see her back in 4 months.

## 2016-09-23 NOTE — Progress Notes (Signed)
cc'ed to pcp °

## 2016-09-23 NOTE — Progress Notes (Signed)
Primary Care Physician: Erasmo Downer, NP  Primary Gastroenterologist:  Roetta Sessions, MD   Chief Complaint  Patient presents with  . Constipation    HPI: Connie Ponce is a 71 y.o. female here for follow-up of constipation and dysphagia. Last seen in February 2018. Last EGD November 2017, low-grade Schatzki ring status post dilation, small hiatal hernia, erythematous gastric mucosa with reactive gastropathy on biopsy, no H pylori. Single Duodenal ulcer noted. No repeat upper endoscopy needed for surveillance. Dysphagia resolved after dilation.  Previously failed Amitiza 24 g twice a day. Switch to Trulance 3 mg daily. She states she still has a bowel movement only every 3 days. Stools are hard and she has to strain. Never had issues with constipation before she started pain medication. She takes at least 3 oxycodone daily. She says she has no appetite. Weight is been stable this year but down about 10 pounds since early 2017. She denies postprandial nausea or vomiting. No abdominal pain. No blood in the stool or melena. She states her labs have been improving with regards to her kidney function. Recently done by PCP. No upper GI complaints.    Current Outpatient Prescriptions  Medication Sig Dispense Refill  . acyclovir (ZOVIRAX) 400 MG tablet Take 1 tablet (400 mg total) by mouth 3 (three) times daily. 90 tablet 2  . aspirin EC 81 MG EC tablet Take 2 tablets (162 mg total) by mouth daily. 60 tablet 0  . budesonide-formoterol (SYMBICORT) 160-4.5 MCG/ACT inhaler Inhale 2 puffs into the lungs 2 (two) times daily as needed (shortness of breath).     . carvedilol (COREG) 12.5 MG tablet     . Cholecalciferol (VITAMIN D-3) 5000 UNITS TABS Take 1 tablet by mouth daily.    Marland Kitchen DEXILANT 60 MG capsule TAKE (1) CAPSULE BY MOUTH ONCE DAILY. 28 capsule 11  . dimethicone 1 % cream Apply 1 application topically 2 (two) times daily as needed for dry skin.    . furosemide (LASIX) 20 MG tablet  Take 40 mg by mouth daily.    Marland Kitchen imipramine (TOFRANIL) 25 MG tablet Take 25 mg by mouth as needed.     Marland Kitchen levothyroxine (SYNTHROID, LEVOTHROID) 125 MCG tablet Take 125 mcg by mouth daily before breakfast.    . meclizine (ANTIVERT) 25 MG tablet Take 25 mg by mouth as needed for dizziness. Reported on 10/06/2015    . oxybutynin (DITROPAN) 5 MG tablet TAKE (1) TABLET BY MOUTH THREE TIMES DAILY 84 tablet 11  . oxyCODONE-acetaminophen (PERCOCET/ROXICET) 5-325 MG tablet 1 tablet every 6 (six) hours as needed for moderate pain or severe pain.     Marland Kitchen Plecanatide (TRULANCE) 3 MG TABS Take 3 mg by mouth daily. 30 tablet 5  . potassium chloride (K-DUR) 10 MEQ tablet     . potassium chloride 20 MEQ TBCR Take 20 mEq by mouth daily. 30 tablet 0  . pravastatin (PRAVACHOL) 40 MG tablet Take 40 mg by mouth daily.    . ranitidine (ZANTAC) 150 MG tablet Take 150 mg by mouth at bedtime.     . sertraline (ZOLOFT) 50 MG tablet     . traZODone (DESYREL) 150 MG tablet Take 300 mg by mouth at bedtime.    . triamcinolone cream (KENALOG) 0.5 % Apply 1 application topically 3 (three) times daily. 30 g 11  . triamterene-hydrochlorothiazide (MAXZIDE-25) 37.5-25 MG per tablet Take 1 tablet by mouth daily.     No current facility-administered medications for this visit.  Allergies as of 09/23/2016 - Review Complete 09/23/2016  Allergen Reaction Noted  . Tetracyclines & related  08/21/2015  . Shellfish-derived products Nausea And Vomiting 07/15/2015    ROS:  General: Negative for anorexia, weight loss, fever, chills, fatigue, weakness. ENT: Negative for hoarseness, difficulty swallowing , nasal congestion. CV: Negative for chest pain, angina, palpitations, dyspnea on exertion, peripheral edema.  Respiratory: Negative for dyspnea at rest, dyspnea on exertion, cough, sputum, wheezing.  GI: See history of present illness. GU:  Negative for dysuria, hematuria, urinary incontinence, urinary frequency, nocturnal urination.    Endo: Negative for unusual weight change.    Physical Examination:   BP 108/62   Pulse (!) 57   Temp (!) 96.8 F (36 C) (Oral)   Ht 5\' 4"  (1.626 m)   Wt 194 lb (88 kg)   BMI 33.30 kg/m   General: Well-nourished, well-developed in no acute distress.  Eyes: No icterus. Mouth: Oropharyngeal mucosa moist and pink , no lesions erythema or exudate. Lungs: Clear to auscultation bilaterally.  Heart: Regular rate and rhythm, no murmurs rubs or gallops.  Abdomen: Bowel sounds are normal, nontender, nondistended, no hepatosplenomegaly or masses, no abdominal bruits or hernia , no rebound or guarding.   Extremities: No lower extremity edema. No clubbing or deformities. Neuro: Alert and oriented x 4   Skin: Warm and dry, no jaundice.   Psych: Alert and cooperative, normal mood and affect.

## 2016-10-04 ENCOUNTER — Other Ambulatory Visit: Payer: Self-pay | Admitting: Gastroenterology

## 2016-11-11 ENCOUNTER — Other Ambulatory Visit (HOSPITAL_COMMUNITY): Payer: Self-pay | Admitting: Nurse Practitioner

## 2016-11-11 DIAGNOSIS — Z1231 Encounter for screening mammogram for malignant neoplasm of breast: Secondary | ICD-10-CM

## 2016-11-20 ENCOUNTER — Ambulatory Visit (HOSPITAL_COMMUNITY)
Admission: RE | Admit: 2016-11-20 | Discharge: 2016-11-20 | Disposition: A | Discharge status: Home / Self Care | Payer: Medicare HMO | Source: Ambulatory Visit | Attending: Nurse Practitioner | Admitting: Nurse Practitioner

## 2016-11-20 DIAGNOSIS — Z1231 Encounter for screening mammogram for malignant neoplasm of breast: Secondary | ICD-10-CM | POA: Insufficient documentation

## 2017-01-06 ENCOUNTER — Other Ambulatory Visit (HOSPITAL_COMMUNITY): Payer: Medicare HMO

## 2017-01-10 ENCOUNTER — Other Ambulatory Visit: Payer: Self-pay | Admitting: Gastroenterology

## 2017-01-24 ENCOUNTER — Ambulatory Visit: Payer: Medicare HMO | Admitting: Gastroenterology

## 2017-01-31 ENCOUNTER — Other Ambulatory Visit: Payer: Self-pay | Admitting: Gastroenterology

## 2017-02-03 ENCOUNTER — Other Ambulatory Visit: Payer: Self-pay | Admitting: Obstetrics & Gynecology

## 2017-03-13 ENCOUNTER — Encounter: Payer: Self-pay | Admitting: Gastroenterology

## 2017-03-13 ENCOUNTER — Ambulatory Visit (INDEPENDENT_AMBULATORY_CARE_PROVIDER_SITE_OTHER): Payer: Medicare HMO | Admitting: Gastroenterology

## 2017-03-13 VITALS — BP 118/64 | HR 55 | Temp 97.6°F | Ht 63.0 in | Wt 195.4 lb

## 2017-03-13 DIAGNOSIS — R1907 Generalized intra-abdominal and pelvic swelling, mass and lump: Secondary | ICD-10-CM

## 2017-03-13 DIAGNOSIS — K5900 Constipation, unspecified: Secondary | ICD-10-CM

## 2017-03-13 DIAGNOSIS — R1310 Dysphagia, unspecified: Secondary | ICD-10-CM | POA: Diagnosis not present

## 2017-03-13 DIAGNOSIS — R1011 Right upper quadrant pain: Secondary | ICD-10-CM | POA: Diagnosis not present

## 2017-03-13 DIAGNOSIS — R109 Unspecified abdominal pain: Secondary | ICD-10-CM | POA: Insufficient documentation

## 2017-03-13 DIAGNOSIS — R1901 Right upper quadrant abdominal swelling, mass and lump: Secondary | ICD-10-CM | POA: Diagnosis not present

## 2017-03-13 DIAGNOSIS — R1030 Lower abdominal pain, unspecified: Secondary | ICD-10-CM

## 2017-03-13 DIAGNOSIS — R1319 Other dysphagia: Secondary | ICD-10-CM

## 2017-03-13 MED ORDER — POLYETHYLENE GLYCOL 3350 17 GM/SCOOP PO POWD: ORAL | 3 refills | Status: DC

## 2017-03-13 NOTE — Assessment & Plan Note (Signed)
Superficial abd wall mass, ?lipoma. Tender on exam. Given diffuse abd tenderness, will proceed with CT A/P with contrast. Labs. Further recommendations to follow pending results.

## 2017-03-13 NOTE — Progress Notes (Signed)
Primary Care Physician: Erasmo DownerStrader, Lindsey F, NP  Primary Gastroenterologist:  Roetta SessionsMichael Rourk, MD   Chief Complaint  Patient presents with  . Constipation  . Abdominal Pain    all over, feels like she has a knot on right side    HPI: Connie BlakeDarlean Ponce is a 71 y.o. female here for follow up. Last seen 09/2016 for constipation, dysphagia. Last EGD November 2017, low-grade Schatzki ring status post dilation, small hiatal hernia, erythematous gastric mucosa with reactive gastropathy on biopsy, no H pylori. Single Duodenal ulcer noted. No repeat upper endoscopy needed for surveillance. Dysphagia resolved after dilation. Colonoscopy in 2013, unremarkable and next TCS planned for 2023.   Previously failed amitiza 24mcg bid, trulance 3mg  daily,Linzess 72mcg daily. Started on Movantik 25mg  daily at last OV.   Weight has been stable. Having difficulty swallowing again. All foods. Pills go down fine. EGD/ED helped for about 10 months. No heartburn. Whole stomach feels like one big sore. Then feels knot in the ruq. Used to be intermittent but now constant. Abdominal pain worse with movement. Sometimes worse with meals. Abd pain better with BM. BM every 2-3 days. Stools start off hard and has to strain, then will soft stool. No melena, brbpr. Takes Movantik every day. No fever. No dysuria. No vomiting.   Current Outpatient Medications  Medication Sig Dispense Refill  . acyclovir (ZOVIRAX) 400 MG tablet Take 1 tablet (400 mg total) by mouth 3 (three) times daily. 90 tablet 2  . aspirin EC 81 MG EC tablet Take 2 tablets (162 mg total) by mouth daily. 60 tablet 0  . budesonide-formoterol (SYMBICORT) 160-4.5 MCG/ACT inhaler Inhale 2 puffs into the lungs 2 (two) times daily as needed (shortness of breath).     . Cholecalciferol (VITAMIN D-3) 5000 UNITS TABS Take 1 tablet by mouth daily.    Marland Kitchen. DEXILANT 60 MG capsule TAKE (1) CAPSULE BY MOUTH ONCE DAILY. 28 capsule 5  . dimethicone 1 % cream Apply 1  application topically 2 (two) times daily as needed for dry skin.    . furosemide (LASIX) 20 MG tablet Take 40 mg by mouth daily.    Marland Kitchen. imipramine (TOFRANIL) 25 MG tablet Take 25 mg by mouth as needed.     Marland Kitchen. levothyroxine (SYNTHROID, LEVOTHROID) 125 MCG tablet Take 125 mcg by mouth daily before breakfast.    . meclizine (ANTIVERT) 25 MG tablet Take 25 mg by mouth as needed for dizziness. Reported on 10/06/2015    . MOVANTIK 25 MG TABS tablet TAKE 1 TABLET BY MOUTH ONCE DAILY. TAKE 1 HOUR BEFORE OR TWO HOURS AFTER MEAL 28 tablet 5  . oxybutynin (DITROPAN) 5 MG tablet TAKE (1) TABLET BY MOUTH THREE TIMES DAILY 90 tablet 3  . oxyCODONE-acetaminophen (PERCOCET/ROXICET) 5-325 MG tablet 1 tablet every 6 (six) hours as needed for moderate pain or severe pain.     Marland Kitchen. Plecanatide 3 MG TABS Take 1 tablet by mouth daily.    . potassium chloride 20 MEQ TBCR Take 20 mEq by mouth daily. 30 tablet 0  . pravastatin (PRAVACHOL) 40 MG tablet Take 40 mg by mouth daily.    . ranitidine (ZANTAC) 150 MG tablet Take 150 mg by mouth at bedtime.     . traZODone (DESYREL) 150 MG tablet Take 300 mg by mouth at bedtime.    . triamcinolone cream (KENALOG) 0.5 % Apply 1 application topically 3 (three) times daily. 30 g 11  . triamterene-hydrochlorothiazide (MAXZIDE-25) 37.5-25 MG per tablet Take 1  tablet by mouth daily.     No current facility-administered medications for this visit.     Allergies as of 03/13/2017 - Review Complete 03/13/2017  Allergen Reaction Noted  . Tetracyclines & related  08/21/2015  . Shellfish-derived products Nausea And Vomiting 07/15/2015    ROS:  General: Negative for anorexia, weight loss, fever, chills, fatigue, weakness. ENT: Negative for hoarseness, difficulty swallowing , nasal congestion. CV: Negative for chest pain, angina, palpitations, dyspnea on exertion, peripheral edema.  Respiratory: Negative for dyspnea at rest, dyspnea on exertion, cough, sputum, wheezing.  GI: See history of  present illness. GU:  Negative for dysuria, hematuria, urinary incontinence, urinary frequency, nocturnal urination.  Endo: Negative for unusual weight change.    Physical Examination:   BP 118/64   Pulse (!) 55   Temp 97.6 F (36.4 C) (Oral)   Ht 5\' 3"  (1.6 m)   Wt 195 lb 6.4 oz (88.6 kg)   BMI 34.61 kg/m   General: Well-nourished, well-developed in no acute distress.  Eyes: No icterus. Mouth: Oropharyngeal mucosa moist and pink , no lesions erythema or exudate. Lungs: Clear to auscultation bilaterally.  Heart: Regular rate and rhythm, no murmurs rubs or gallops.  Abdomen: Bowel sounds are normal,   nondistended, no hepatosplenomegaly , no abdominal bruits or hernia , no rebound or guarding.  Diffuse tenderness with palpation of entire abdomen. Rectus diastasis. Increased tenderness in ruq with superficial abd wall mass.  Extremities: No lower extremity edema. No clubbing or deformities. Neuro: Alert and oriented x 4   Skin: Warm and dry, no jaundice.   Psych: Alert and cooperative, normal mood and affect.

## 2017-03-13 NOTE — Patient Instructions (Signed)
PA info for CT abd/pelvis w/contrast submitted via [facility] website. Case approved. PA# [id number], 03/26/17-04/25/17.

## 2017-03-13 NOTE — Assessment & Plan Note (Signed)
May require BPE to further evaluate given short lived response to EGD/ED last year. Await CT findings first.

## 2017-03-13 NOTE — Assessment & Plan Note (Signed)
Continue Movantik. Add miralax one capful daily. Call if ongoing problems with constipation

## 2017-03-13 NOTE — Patient Instructions (Signed)
1. Please start Miralax one capful daily for constipation.  2. Continue Movantik daily.  3. Please have your labs and CT scan done. Further recommendations to follow pending results.

## 2017-03-14 LAB — COMPREHENSIVE METABOLIC PANEL
AG RATIO: 1.6 (calc) (ref 1.0–2.5)
ALBUMIN MSPROF: 3.9 g/dL (ref 3.6–5.1)
ALKALINE PHOSPHATASE (APISO): 75 U/L (ref 33–130)
ALT: 9 U/L (ref 6–29)
AST: 13 U/L (ref 10–35)
BUN/Creatinine Ratio: 23 (calc) — ABNORMAL HIGH (ref 6–22)
BUN: 32 mg/dL — ABNORMAL HIGH (ref 7–25)
CHLORIDE: 102 mmol/L (ref 98–110)
CO2: 31 mmol/L (ref 20–32)
CREATININE: 1.37 mg/dL — AB (ref 0.60–0.93)
Calcium: 10.2 mg/dL (ref 8.6–10.4)
GLOBULIN: 2.5 g/dL (ref 1.9–3.7)
Glucose, Bld: 91 mg/dL (ref 65–139)
POTASSIUM: 3.3 mmol/L — AB (ref 3.5–5.3)
Sodium: 141 mmol/L (ref 135–146)
Total Bilirubin: 0.3 mg/dL (ref 0.2–1.2)
Total Protein: 6.4 g/dL (ref 6.1–8.1)

## 2017-03-14 LAB — LIPASE: LIPASE: 8 U/L (ref 7–60)

## 2017-03-14 LAB — CBC WITH DIFFERENTIAL/PLATELET
BASOS ABS: 99 {cells}/uL (ref 0–200)
Basophils Relative: 1.6 %
EOS PCT: 10.3 %
Eosinophils Absolute: 639 cells/uL — ABNORMAL HIGH (ref 15–500)
HCT: 39 % (ref 35.0–45.0)
Hemoglobin: 13.6 g/dL (ref 11.7–15.5)
Lymphs Abs: 2393 cells/uL (ref 850–3900)
MCH: 31.1 pg (ref 27.0–33.0)
MCHC: 34.9 g/dL (ref 32.0–36.0)
MCV: 89.2 fL (ref 80.0–100.0)
MPV: 11.1 fL (ref 7.5–12.5)
Monocytes Relative: 8.7 %
NEUTROS PCT: 40.8 %
Neutro Abs: 2530 cells/uL (ref 1500–7800)
PLATELETS: 110 10*3/uL — AB (ref 140–400)
RBC: 4.37 10*6/uL (ref 3.80–5.10)
RDW: 12.7 % (ref 11.0–15.0)
Total Lymphocyte: 38.6 %
WBC mixed population: 539 cells/uL (ref 200–950)
WBC: 6.2 10*3/uL (ref 3.8–10.8)

## 2017-03-14 NOTE — Progress Notes (Signed)
cc'ed to pcp °

## 2017-03-24 NOTE — Progress Notes (Signed)
Please let patient know her BUN/Creatinine are little up but near baseline, possibly related to her fluid pills. Her potassium is just a little low.  She can increase her potassium to bid for 2 days. Then go back to regular dose.  Await CT findings.  Send copy of labs to PCP and ask patient to follow up with PCP regarding her kidney labs.

## 2017-03-26 ENCOUNTER — Ambulatory Visit (HOSPITAL_COMMUNITY)
Admission: RE | Admit: 2017-03-26 | Discharge: 2017-03-26 | Disposition: A | Discharge status: Home / Self Care | Payer: Medicare HMO | Source: Ambulatory Visit | Attending: Gastroenterology | Admitting: Gastroenterology

## 2017-03-26 DIAGNOSIS — M48061 Spinal stenosis, lumbar region without neurogenic claudication: Secondary | ICD-10-CM | POA: Diagnosis not present

## 2017-03-26 DIAGNOSIS — M87852 Other osteonecrosis, left femur: Secondary | ICD-10-CM | POA: Insufficient documentation

## 2017-03-26 DIAGNOSIS — R1011 Right upper quadrant pain: Secondary | ICD-10-CM | POA: Insufficient documentation

## 2017-03-26 DIAGNOSIS — Z90710 Acquired absence of both cervix and uterus: Secondary | ICD-10-CM | POA: Diagnosis not present

## 2017-03-26 DIAGNOSIS — M87851 Other osteonecrosis, right femur: Secondary | ICD-10-CM | POA: Diagnosis not present

## 2017-03-26 DIAGNOSIS — K5730 Diverticulosis of large intestine without perforation or abscess without bleeding: Secondary | ICD-10-CM | POA: Diagnosis not present

## 2017-03-26 DIAGNOSIS — K439 Ventral hernia without obstruction or gangrene: Secondary | ICD-10-CM | POA: Diagnosis not present

## 2017-03-26 DIAGNOSIS — I708 Atherosclerosis of other arteries: Secondary | ICD-10-CM | POA: Diagnosis not present

## 2017-03-26 DIAGNOSIS — R1907 Generalized intra-abdominal and pelvic swelling, mass and lump: Secondary | ICD-10-CM | POA: Diagnosis not present

## 2017-03-26 DIAGNOSIS — M1288 Other specific arthropathies, not elsewhere classified, other specified site: Secondary | ICD-10-CM | POA: Insufficient documentation

## 2017-03-26 DIAGNOSIS — R600 Localized edema: Secondary | ICD-10-CM | POA: Insufficient documentation

## 2017-03-26 MED ORDER — IOPAMIDOL (ISOVUE-300) INJECTION 61%
80.0000 mL | Freq: Once | INTRAVENOUS | Status: AC | PRN
  Administered 2017-03-26: 80 mL via INTRAVENOUS

## 2017-03-31 NOTE — Progress Notes (Signed)
Please let patient know CT showed significant stool throughout the colon. Small ventral hernia containing only fat and not likely cause of symptoms.  Mild periportal edema of ?significant. No known liver disease. LFTs were normal.   Encourage continued use of Movantik and miralax as per OV plan. If constipation not improved, let me know.  I will discuss mild periportal edema with RMR. Further recommendations to follow.   DR. Jena GaussOURK, ANY SUGGESTIONS REGARDING MILD PERIPORTAL EDEMA. HER LFTS ARE NORMAL. NO H/O LIVER DISEASE.

## 2017-04-25 ENCOUNTER — Other Ambulatory Visit: Payer: Self-pay | Admitting: Obstetrics & Gynecology

## 2017-04-26 NOTE — Progress Notes (Signed)
Dr. Jena Gaussourk, any suggestions regarding mild periportal edema? Her LFTs are normal. NO h/o liver disease.

## 2017-04-28 ENCOUNTER — Encounter: Payer: Self-pay | Admitting: Internal Medicine

## 2017-04-28 NOTE — Progress Notes (Signed)
Nothing else to due for mild periportal edema. Dr. Jena Gaussourk agrees with management of constipation.  Please offer f/u ov with rmr in 2 months.

## 2017-04-28 NOTE — Progress Notes (Signed)
PATIENT SCHEDULED AND LETTER SENT  °

## 2017-07-01 ENCOUNTER — Ambulatory Visit (INDEPENDENT_AMBULATORY_CARE_PROVIDER_SITE_OTHER): Payer: Medicare HMO | Admitting: Internal Medicine

## 2017-07-01 ENCOUNTER — Encounter: Payer: Self-pay | Admitting: Internal Medicine

## 2017-07-01 VITALS — BP 123/66 | HR 68 | Temp 97.1°F | Ht 63.0 in | Wt 199.6 lb

## 2017-07-01 DIAGNOSIS — K219 Gastro-esophageal reflux disease without esophagitis: Secondary | ICD-10-CM | POA: Diagnosis not present

## 2017-07-01 DIAGNOSIS — K5903 Drug induced constipation: Secondary | ICD-10-CM

## 2017-07-01 DIAGNOSIS — K439 Ventral hernia without obstruction or gangrene: Secondary | ICD-10-CM

## 2017-07-01 NOTE — Patient Instructions (Signed)
  Continue Dexilant 60 mg daily  Continue Movantik daily  Continue Miralax one capful daily  Office visit in 1 year

## 2017-07-01 NOTE — Progress Notes (Signed)
Primary Care Physician:  [name], NP Primary Gastroenterologist:  Dr. Gala Romney  Pre-Procedure History & Physical: HPI:  Consepcion Ponce is a 72 y.o. female here for here for follow-up of GERD and constipation. Movantik 25 mg daily and MiraLAX 1 cap daily associated 1 BM daily occasionally has a hard stool but very pleased overall with her progress. She is on multiple medications which predisposed her constipation.  GERD symptoms well controlled on Dexilant 60 mg daily. Dysphagia overall significantly improved after esophageal dilation.  Abdominal lesion found to be a ventral hernia on recent CT scanning (this is not bothering the patient).  Past Medical History:  Diagnosis Date  . Anxiety   . Carpal tunnel syndrome   . COPD (chronic obstructive pulmonary disease) ([place])   . Gastric ulcer   . GERD (gastroesophageal reflux disease)   . Hyperglycemia   . Hyperlipidemia   . Hypertension   . Hypothyroidism   . Osteoarthritis   . Peptic ulcer disease   . Pneumonia 2010  . Renal insufficiency   . 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 Stroke ([place]) 1990s   mild  . TIA (transient ischemic attack)     Past Surgical History:  Procedure Laterality Date  . ABDOMINAL HYSTERECTOMY    . APPENDECTOMY    . BACK SURGERY    . BONE MARROW ASPIRATION Left 04/22/14  . BONE MARROW BIOPSY Left 04/22/14  . CHOLECYSTECTOMY    . COLONOSCOPY     ?2005, [place]  . COLONOSCOPY N/A 04/07/2015   YIR:[id number]  . COLONOSCOPY WITH ESOPHAGOGASTRODUODENOSCOPY (EGD)  02/11/2012   RMR: Ulcerative/erosive reflux esophagitis, benign appearing gastric ulcer with unremarkable biopsy, no H pylori. Colonoscopy was unremarkable. Next colonoscopy in 2023  . CYSTECTOMY     cyst removed from rectal area.   . ESOPHAGOGASTRODUODENOSCOPY  01/05/2003   RMR: Normal esophagus, small hiatal hernia/ A couple of tiny antral erosions, otherwise normal stomach normal D1 and D2.   . ESOPHAGOGASTRODUODENOSCOPY N/A  08/11/2014   VOJ:[id number] ulcer/HH  . ESOPHAGOGASTRODUODENOSCOPY N/A 12/01/2014   WEX:[id number] improved gastric ulcerations/p bx  . ESOPHAGOGASTRODUODENOSCOPY N/A 01/31/2016   Procedure: ESOPHAGOGASTRODUODENOSCOPY (EGD);  Surgeon: Daneil Dolin, MD;  Location: [facility];  Service: Endoscopy;  Laterality: N/A;  745  . HEMORRHOID SURGERY    . JOINT REPLACEMENT Right    knee  . MALONEY DILATION N/A 01/31/2016   Procedure: Venia Minks DILATION;  Surgeon: Daneil Dolin, MD;  Location: [facility];  Service: Endoscopy;  Laterality: N/A;    Prior to Admission medications   Medication Sig Start Date End Date Taking? Authorizing Provider  acyclovir (ZOVIRAX) 400 MG tablet Take 1 tablet (400 mg total) by mouth 3 (three) times daily. 03/14/16  Yes Florian Buff, MD  aspirin EC 81 MG EC tablet Take 2 tablets (162 mg total) by mouth daily. 01/05/16  Yes Thurnell Lose, MD  budesonide-formoterol [facility]) 160-4.5 MCG/ACT inhaler Inhale 2 puffs into the lungs 2 (two) times daily as needed (shortness of breath).    Yes [provider]  Cholecalciferol (VITAMIN D-3) 5000 UNITS TABS Take 1 tablet by mouth daily.   Yes [provider]  DEXILANT 60 MG capsule TAKE (1) CAPSULE BY MOUTH ONCE DAILY. 01/31/17  Yes Carlis Stable, NP  dimethicone 1 % cream Apply 1 application topically 2 (two) times daily as needed for dry skin.   Yes [provider]  furosemide (LASIX) 20 MG tablet Take 40 mg by mouth  daily.   Yes [provider]  imipramine (TOFRANIL) 25 MG tablet Take 25 mg by mouth as needed.    Yes [provider]  levothyroxine (SYNTHROID, LEVOTHROID) 125 MCG tablet Take 125 mcg by mouth daily before breakfast.   Yes [provider]  meclizine (ANTIVERT) 25 MG tablet Take 25 mg by mouth as needed for dizziness. Reported on 10/06/2015   Yes [provider]  MOVANTIK 25 MG TABS tablet TAKE 1 TABLET BY MOUTH ONCE DAILY. TAKE 1 HOUR BEFORE OR  TWO HOURS AFTER MEAL 01/10/17  Yes Carlis Stable, NP  oxybutynin (DITROPAN) 5 MG tablet TAKE (1) TABLET BY MOUTH THREE TIMES DAILY 04/26/17  Yes Florian Buff, MD  oxyCODONE-acetaminophen (PERCOCET/ROXICET) 5-325 MG tablet 1 tablet every 6 (six) hours as needed for moderate pain or severe pain.  06/09/15  Yes [provider]  Plecanatide 3 MG TABS Take 1 tablet by mouth daily.   Yes [provider]  polyethylene glycol powder (GLYCOLAX/MIRALAX) powder 17 grams orally daily for constipation 03/13/17  Yes Mahala Menghini, PA-C  potassium chloride 20 MEQ TBCR Take 20 mEq by mouth daily. 01/04/16  Yes Thurnell Lose, MD  pravastatin (PRAVACHOL) 40 MG tablet Take 40 mg by mouth daily.   Yes [provider]  ranitidine (ZANTAC) 150 MG tablet Take 150 mg by mouth at bedtime.  12/08/14  Yes [provider]  traZODone (DESYREL) 150 MG tablet Take 300 mg by mouth at bedtime.   Yes [provider]  triamcinolone cream (KENALOG) 0.5 % Apply 1 application topically 3 (three) times daily. 03/28/16  Yes Florian Buff, MD  triamterene-hydrochlorothiazide (MAXZIDE-25) 37.5-25 MG per tablet Take 1 tablet by mouth daily.   Yes [provider]    Allergies as of 07/01/2017 - Review Complete 07/01/2017  Allergen Reaction Noted  . Tetracyclines & related  08/21/2015  . Shellfish-derived products Nausea And Vomiting 07/15/2015    Family History  Problem Relation Age of Onset  . Other Mother        died age 38, natural causes  . Stroke Mother   . Diabetes Daughter   . Healthy Son   . Healthy Daughter   . Healthy Daughter   . Colon cancer Neg Hx   . Liver disease Neg Hx     Social History   Socioeconomic History  . Marital status: Married    Spouse name: Not on file  . Number of children: 3  . Years of education: Not on file  . Highest education level: Not on file  Occupational History  . Not on file  Social Needs  . Financial resource strain:  Not on file  . Food insecurity:    Worry: Not on file    Inability: Not on file  . Transportation needs:    Medical: Not on file    Non-medical: Not on file  Tobacco Use  . Smoking status: Current Every Day Smoker    Packs/day: 0.25    Types: Cigarettes  . Smokeless tobacco: Never Used  . Tobacco comment: Is trying to quit, actively cutting back  Substance and Sexual Activity  . Alcohol use: No    Alcohol/week: 0.0 oz  . Drug use: No  . Sexual activity: Never  Lifestyle  . Physical activity:    Days per week: Not on file    Minutes per session: Not on file  . Stress: Not on file  Relationships  . Social connections:  Talks on phone: Not on file    Gets together: Not on file    Attends religious service: Not on file    Active member of club or organization: Not on file    Attends meetings of clubs or organizations: Not on file    Relationship status: Not on file  . Intimate partner violence:    Fear of current or ex partner: Not on file    Emotionally abused: Not on file    Physically abused: Not on file    Forced sexual activity: Not on file  Other Topics Concern  . Not on file  Social History Narrative  . Not on file    Review of Systems: See HPI, otherwise negative ROS  Physical Exam: BP 123/66   Pulse 68   Temp (!) 97.1 F (36.2 C) (Oral)   Ht 5' 3"  (1.6 m)   Wt 199 lb 9.6 oz (90.5 kg)   BMI 35.36 kg/m  General:   Alert,  Well-developed, well-nourished, pleasant and cooperative in NAD Skin:  Intact without significant lesions or rashes.  Lungs:  Clear throughout to auscultation.   No wheezes, crackles, or rhonchi. No acute distress. Heart:  Regular rate and rhythm; no murmurs, clicks, rubs,  or gallops. Abdomen: Non-distended, normal bowel sounds.  Soft and nontender;  Rectus defect previously noted.  Impression:  Pleasant 72 year old lady with the GERD well-controlled on Dexilanat.  Dysphagia -  improved -  status post esophageal dilation.  Constipation managed fairly well these days with MiraLAX and Movantik 25 mg daily. Ventral hernia-asymptomatic   Recommendations:  Continue Dexilant 60 mg daily  Continue Movantik daily  Continue Miralax one capful daily  Office visit in 1 year    Notice: This dictation was prepared with Dragon dictation along with smaller phrase technology. Any transcriptional errors that result from this process are unintentional and may not be corrected upon review.

## 2017-07-07 ENCOUNTER — Other Ambulatory Visit (HOSPITAL_COMMUNITY): Payer: Medicare HMO

## 2017-07-07 ENCOUNTER — Ambulatory Visit (HOSPITAL_COMMUNITY): Payer: Medicare HMO | Admitting: Internal Medicine

## 2017-07-08 ENCOUNTER — Other Ambulatory Visit: Payer: Self-pay | Admitting: Gastroenterology

## 2017-07-18 ENCOUNTER — Other Ambulatory Visit (HOSPITAL_COMMUNITY): Payer: Medicare HMO

## 2017-07-18 ENCOUNTER — Other Ambulatory Visit (HOSPITAL_COMMUNITY): Payer: Self-pay

## 2017-07-18 ENCOUNTER — Ambulatory Visit (HOSPITAL_COMMUNITY): Payer: Medicare HMO | Admitting: Internal Medicine

## 2017-07-18 DIAGNOSIS — D649 Anemia, unspecified: Secondary | ICD-10-CM

## 2017-07-18 DIAGNOSIS — D696 Thrombocytopenia, unspecified: Secondary | ICD-10-CM

## 2017-07-21 ENCOUNTER — Inpatient Hospital Stay (HOSPITAL_BASED_OUTPATIENT_CLINIC_OR_DEPARTMENT_OTHER): Payer: Medicare HMO | Admitting: Internal Medicine

## 2017-07-21 ENCOUNTER — Encounter (HOSPITAL_COMMUNITY): Payer: Self-pay | Admitting: Internal Medicine

## 2017-07-21 ENCOUNTER — Other Ambulatory Visit: Payer: Self-pay

## 2017-07-21 ENCOUNTER — Inpatient Hospital Stay (HOSPITAL_COMMUNITY): Payer: Medicare HMO | Attending: Hematology

## 2017-07-21 VITALS — BP 102/51 | HR 73 | Temp 98.6°F | Resp 18 | Wt 190.0 lb

## 2017-07-21 DIAGNOSIS — D649 Anemia, unspecified: Secondary | ICD-10-CM | POA: Diagnosis not present

## 2017-07-21 DIAGNOSIS — Z79899 Other long term (current) drug therapy: Secondary | ICD-10-CM | POA: Diagnosis not present

## 2017-07-21 DIAGNOSIS — I10 Essential (primary) hypertension: Secondary | ICD-10-CM | POA: Insufficient documentation

## 2017-07-21 DIAGNOSIS — M1990 Unspecified osteoarthritis, unspecified site: Secondary | ICD-10-CM | POA: Insufficient documentation

## 2017-07-21 DIAGNOSIS — R21 Rash and other nonspecific skin eruption: Secondary | ICD-10-CM | POA: Insufficient documentation

## 2017-07-21 DIAGNOSIS — F17200 Nicotine dependence, unspecified, uncomplicated: Secondary | ICD-10-CM

## 2017-07-21 DIAGNOSIS — Z7982 Long term (current) use of aspirin: Secondary | ICD-10-CM | POA: Insufficient documentation

## 2017-07-21 DIAGNOSIS — Z8711 Personal history of peptic ulcer disease: Secondary | ICD-10-CM | POA: Insufficient documentation

## 2017-07-21 DIAGNOSIS — Z8673 Personal history of transient ischemic attack (TIA), and cerebral infarction without residual deficits: Secondary | ICD-10-CM | POA: Insufficient documentation

## 2017-07-21 DIAGNOSIS — J449 Chronic obstructive pulmonary disease, unspecified: Secondary | ICD-10-CM | POA: Diagnosis not present

## 2017-07-21 DIAGNOSIS — K449 Diaphragmatic hernia without obstruction or gangrene: Secondary | ICD-10-CM | POA: Diagnosis not present

## 2017-07-21 DIAGNOSIS — F419 Anxiety disorder, unspecified: Secondary | ICD-10-CM | POA: Insufficient documentation

## 2017-07-21 DIAGNOSIS — F17210 Nicotine dependence, cigarettes, uncomplicated: Secondary | ICD-10-CM | POA: Insufficient documentation

## 2017-07-21 DIAGNOSIS — D696 Thrombocytopenia, unspecified: Secondary | ICD-10-CM | POA: Diagnosis present

## 2017-07-21 DIAGNOSIS — K219 Gastro-esophageal reflux disease without esophagitis: Secondary | ICD-10-CM | POA: Insufficient documentation

## 2017-07-21 DIAGNOSIS — K5900 Constipation, unspecified: Secondary | ICD-10-CM | POA: Insufficient documentation

## 2017-07-21 DIAGNOSIS — K259 Gastric ulcer, unspecified as acute or chronic, without hemorrhage or perforation: Secondary | ICD-10-CM | POA: Diagnosis not present

## 2017-07-21 DIAGNOSIS — E785 Hyperlipidemia, unspecified: Secondary | ICD-10-CM | POA: Diagnosis not present

## 2017-07-21 DIAGNOSIS — E039 Hypothyroidism, unspecified: Secondary | ICD-10-CM | POA: Insufficient documentation

## 2017-07-21 LAB — COMPREHENSIVE METABOLIC PANEL
ALK PHOS: 79 U/L (ref 38–126)
ALT: 21 U/L (ref 14–54)
AST: 15 U/L (ref 15–41)
Albumin: 3.4 g/dL — ABNORMAL LOW (ref 3.5–5.0)
Anion gap: 9 (ref 5–15)
BILIRUBIN TOTAL: 0.4 mg/dL (ref 0.3–1.2)
BUN: 31 mg/dL — AB (ref 6–20)
CALCIUM: 9.3 mg/dL (ref 8.9–10.3)
CO2: 31 mmol/L (ref 22–32)
CREATININE: 1.67 mg/dL — AB (ref 0.44–1.00)
Chloride: 95 mmol/L — ABNORMAL LOW (ref 101–111)
GFR calc Af Amer: 34 mL/min — ABNORMAL LOW (ref >60)
GFR calc non Af Amer: 29 mL/min — ABNORMAL LOW (ref >60)
Glucose, Bld: 115 mg/dL — ABNORMAL HIGH (ref 65–99)
POTASSIUM: 3.6 mmol/L (ref 3.5–5.1)
Sodium: 135 mmol/L (ref 135–145)
TOTAL PROTEIN: 6.8 g/dL (ref 6.5–8.1)

## 2017-07-21 LAB — CBC WITH DIFFERENTIAL/PLATELET
BASOS ABS: 0 10*3/uL (ref 0.0–0.1)
Basophils Relative: 0 %
Eosinophils Absolute: 0.4 10*3/uL (ref 0.0–0.7)
Eosinophils Relative: 6 %
HEMATOCRIT: 42 % (ref 36.0–46.0)
HEMOGLOBIN: 13.9 g/dL (ref 12.0–15.0)
LYMPHS PCT: 35 %
Lymphs Abs: 2.5 10*3/uL (ref 0.7–4.0)
MCH: 31.2 pg (ref 26.0–34.0)
MCHC: 33.1 g/dL (ref 30.0–36.0)
MCV: 94.2 fL (ref 78.0–100.0)
MONO ABS: 0.8 10*3/uL (ref 0.1–1.0)
Monocytes Relative: 11 %
NEUTROS ABS: 3.5 10*3/uL (ref 1.7–7.7)
NEUTROS PCT: 48 %
Platelets: 197 10*3/uL (ref 150–400)
RBC: 4.46 MIL/uL (ref 3.87–5.11)
RDW: 13.1 % (ref 11.5–15.5)
WBC: 7.2 10*3/uL (ref 4.0–10.5)

## 2017-07-21 LAB — FOLATE: Folate: 11.8 ng/mL (ref >5.9)

## 2017-07-21 LAB — FERRITIN: FERRITIN: 95 ng/mL (ref 11–307)

## 2017-07-21 LAB — VITAMIN B12: Vitamin B-12: 600 pg/mL (ref 180–914)

## 2017-07-21 LAB — IRON AND TIBC
IRON: 54 ug/dL (ref 28–170)
SATURATION RATIOS: 16 % (ref 10.4–31.8)
TIBC: 333 ug/dL (ref 250–450)
UIBC: 279 ug/dL

## 2017-07-21 NOTE — Progress Notes (Signed)
Diagnosis Thrombocytopenia ([place])  Smoking - Plan: Ambulatory Referral for Lung Cancer Screening  Staging Cancer Staging No matching staging information was found for the patient.  Assessment and Plan:  1.  Thrombocytopenia/Anemia: Pt was followed by NP Renato Battles.  She underwent bone marrow biopsy 04/22/2014 that showed Osino marrow.  Labs done today 07/21/2017 showed WBC 7.2 HB 13.9 Plts 197,000.  Pt has undergone CT of the abdomen 03/26/2017 that showed liver and spleen were WNL.  Pt has stable counts dating back to 06/2016.  She will be assigned to prn follow-up.    2.  Skin rash.  I have discussed with her to discuss this with PCP.  Pt may need dermatology referral if no improvement once seen by PCP.   3. RI.  Cr noted to be 1.6.  Follow-up with PCP for labs follow-up.  Pt may need nephrology referral.   4.  Smoking.  She had previous discussions regarding smoking cessation.  She will be referred to lung cancer screening for evaluation.   5.  Health maintenance.  Continue GI and breast screening as recommended.    Current Status:  Pt is seen today for follow-up.  She is reporting a rash.  She is here to go over labs.      Problem List Patient Active Problem List   Diagnosis Date Noted  . Abdominal mass, RUQ (right upper quadrant) [R19.01] 03/13/2017  . Abdominal pain [R10.9] 03/13/2017  . RUQ pain [R10.11] 03/13/2017  . Dysphagia [R13.10]   . TIA (transient ischemic attack) [G45.9] 01/03/2016  . Absolute anemia [D64.9] 07/03/2015  . Proctalgia [K62.89]   . Rectal pain [K62.89] 01/04/2015  . Hemorrhoids [K64.9] 01/04/2015  . Diarrhea [R19.7] 01/04/2015  . Gastric ulceration [K25.9]   . Gastric ulcer [K25.9] 11/29/2014  . Toxic metabolic encephalopathy [Z76] 09/20/2014  . UTI (lower urinary tract infection) [N39.0] 09/19/2014  . ARF (acute renal failure) ([place]) [N17.9]   . LUQ pain [R10.12] 07/28/2014  . Nausea with vomiting [R11.2] 07/28/2014  . Bilateral lower extremity edema  [R60.0] 07/28/2014  . Thrombocytopenia ([facility]) [D69.6] 04/12/2014  . RLQ abdominal pain [R10.31] 01/22/2012  . Constipation [K59.00] 01/22/2012  . GERD (gastroesophageal reflux disease) [K21.9] 01/22/2012    Past Medical History Past Medical History:  Diagnosis Date  . Anxiety   . Carpal tunnel syndrome   . COPD (chronic obstructive pulmonary disease) ([place])   . Gastric ulcer   . GERD (gastroesophageal reflux disease)   . Hyperglycemia   . Hyperlipidemia   . Hypertension   . Hypothyroidism   . Osteoarthritis   . Peptic ulcer disease   . Pneumonia 2010  . Renal insufficiency   . 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 Stroke ([place]) 1990s   mild  . TIA (transient ischemic attack)     Past Surgical History Past Surgical History:  Procedure Laterality Date  . ABDOMINAL HYSTERECTOMY    . APPENDECTOMY    . BACK SURGERY    . BONE MARROW ASPIRATION Left 04/22/14  . BONE MARROW BIOPSY Left 04/22/14  . CHOLECYSTECTOMY    . COLONOSCOPY     ?2005, [facility]  . COLONOSCOPY N/A 04/07/2015   BHA:[id number]  . COLONOSCOPY WITH ESOPHAGOGASTRODUODENOSCOPY (EGD)  02/11/2012   RMR: Ulcerative/erosive reflux esophagitis, benign appearing gastric ulcer with unremarkable biopsy, no H pylori. Colonoscopy was unremarkable. Next colonoscopy in 2023  . CYSTECTOMY     cyst removed from rectal area.   . ESOPHAGOGASTRODUODENOSCOPY  01/05/2003   RMR: Normal esophagus, small  hiatal hernia/ A couple of tiny antral erosions, otherwise normal stomach normal D1 and D2.   . ESOPHAGOGASTRODUODENOSCOPY N/A 08/11/2014   XQJ:JHERDEY ulcer/HH  . ESOPHAGOGASTRODUODENOSCOPY N/A 12/01/2014   CXK:[id number] improved gastric ulcerations/p bx  . ESOPHAGOGASTRODUODENOSCOPY N/A 01/31/2016   Procedure: ESOPHAGOGASTRODUODENOSCOPY (EGD);  Surgeon: Daneil Dolin, MD;  Location: [facility];  Service: Endoscopy;  Laterality: N/A;  745  . HEMORRHOID SURGERY    . JOINT REPLACEMENT Right    knee  . MALONEY DILATION N/A  01/31/2016   Procedure: Venia Minks DILATION;  Surgeon: Daneil Dolin, MD;  Location: [facility];  Service: Endoscopy;  Laterality: N/A;    Family History Family History  Problem Relation Age of Onset  . Other Mother        died age 42, natural causes  . Stroke Mother   . Diabetes Daughter   . Healthy Son   . Healthy Daughter   . Healthy Daughter   . Colon cancer Neg Hx   . Liver disease Neg Hx      Social History  reports that she has been smoking cigarettes.  She has been smoking about 0.25 packs per day. She has never used smokeless tobacco. She reports that she does not drink alcohol or use drugs.  Medications  Current Outpatient Medications:  .  aspirin EC 81 MG EC tablet, Take 2 tablets (162 mg total) by mouth daily., Disp: 60 tablet, Rfl: 0 .  budesonide-formoterol (SYMBICORT) 160-4.5 MCG/ACT inhaler, Inhale 2 puffs into the lungs 2 (two) times daily as needed (shortness of breath). , Disp: , Rfl:  .  carvedilol (COREG) 12.5 MG tablet, , Disp: , Rfl:  .  Cholecalciferol (VITAMIN D-3) 5000 UNITS TABS, Take 1 tablet by mouth daily., Disp: , Rfl:  .  DEXILANT 60 MG capsule, TAKE (1) CAPSULE BY MOUTH ONCE DAILY., Disp: 28 capsule, Rfl: 5 .  diltiazem (TIAZAC) 360 MG 24 hr capsule, , Disp: , Rfl:  .  dimethicone 1 % cream, Apply 1 application topically 2 (two) times daily as needed for dry skin., Disp: , Rfl:  .  gabapentin (NEURONTIN) 300 MG capsule, , Disp: , Rfl:  .  hydrocortisone 2.5 % cream, , Disp: , Rfl:  .  imipramine (TOFRANIL) 25 MG tablet, Take 25 mg by mouth as needed. , Disp: , Rfl:  .  levothyroxine (SYNTHROID, LEVOTHROID) 125 MCG tablet, Take 125 mcg by mouth daily before breakfast., Disp: , Rfl:  .  meclizine (ANTIVERT) 25 MG tablet, Take 25 mg by mouth as needed for dizziness. Reported on 10/06/2015, Disp: , Rfl:  .  MOVANTIK 25 MG TABS tablet, TAKE 1 TABLET BY MOUTH ONCE DAILY. TAKE 1 HOUR BEFORE OR TWO HOURS AFTER MEAL, Disp: 30 tablet, Rfl: 5 .  oxybutynin  (DITROPAN) 5 MG tablet, TAKE (1) TABLET BY MOUTH THREE TIMES DAILY, Disp: 90 tablet, Rfl: 11 .  oxyCODONE-acetaminophen (PERCOCET/ROXICET) 5-325 MG tablet, 1 tablet every 6 (six) hours as needed for moderate pain or severe pain. , Disp: , Rfl:  .  Plecanatide 3 MG TABS, Take 1 tablet by mouth daily., Disp: , Rfl:  .  polyethylene glycol powder (GLYCOLAX/MIRALAX) powder, 17 grams orally daily for constipation, Disp: 527 g, Rfl: 3 .  potassium chloride SA (K-DUR,KLOR-CON) 20 MEQ tablet, , Disp: , Rfl:  .  pravastatin (PRAVACHOL) 40 MG tablet, Take 40 mg by mouth daily., Disp: , Rfl:  .  ranitidine (ZANTAC) 150 MG tablet, Take 150 mg by mouth at bedtime. ,  Disp: , Rfl:  .  sertraline (ZOLOFT) 50 MG tablet, , Disp: , Rfl:  .  torsemide (DEMADEX) 20 MG tablet, , Disp: , Rfl:  .  traZODone (DESYREL) 150 MG tablet, Take 300 mg by mouth at bedtime., Disp: , Rfl:  .  triamcinolone cream (KENALOG) 0.5 %, Apply 1 application topically 3 (three) times daily., Disp: 30 g, Rfl: 11 .  triamterene-hydrochlorothiazide (MAXZIDE-25) 37.5-25 MG per tablet, Take 1 tablet by mouth daily., Disp: , Rfl:   Allergies Tetracyclines & related and Shellfish-derived products  Review of Systems Review of Systems - Oncology ROS as per HPI otherwise 12 point ROS is negative.   Physical Exam  Vitals Wt Readings from Last 3 Encounters:  07/21/17 190 lb (86.2 kg)  07/01/17 199 lb 9.6 oz (90.5 kg)  03/13/17 195 lb 6.4 oz (88.6 kg)   Temp Readings from Last 3 Encounters:  07/21/17 98.6 F (37 C) (Oral)  07/01/17 (!) 97.1 F (36.2 C) (Oral)  03/13/17 97.6 F (36.4 C) (Oral)   BP Readings from Last 3 Encounters:  07/21/17 (!) 102/51  07/01/17 123/66  03/13/17 118/64   Pulse Readings from Last 3 Encounters:  07/21/17 73  07/01/17 68  03/13/17 (!) 55   Constitutional: Well-developed, well-nourished, and in no distress.   HENT: Head: Normocephalic and atraumatic.  Mouth/Throat: No oropharyngeal exudate. Mucosa  moist. Eyes: Pupils are equal, round, and reactive to light. Conjunctivae are normal. No scleral icterus.  Neck: Normal range of motion. Neck supple. No JVD present.  Cardiovascular: Normal rate, regular rhythm and normal heart sounds.  Exam reveals no gallop and no friction rub.   No murmur heard. Pulmonary/Chest: Effort normal and breath sounds normal. No respiratory distress. No wheezes.No rales.  Abdominal: Soft. Bowel sounds are normal. No distension. There is no tenderness. There is no guarding.  Musculoskeletal: No edema or tenderness.  Lymphadenopathy: No cervical, axillary or supraclavicular adenopathy.  Neurological: Alert and oriented to person, place, and time. No cranial nerve deficit.  Skin: Skin is warm and dry. Nonspecific rash noted on arms.   Psychiatric: Affect and judgment normal.   Labs Appointment on 07/21/2017  Component Date Value Ref Range Status  . WBC 07/21/2017 7.2  4.0 - 10.5 K/uL Final  . RBC 07/21/2017 4.46  3.87 - 5.11 MIL/uL Final  . Hemoglobin 07/21/2017 13.9  12.0 - 15.0 g/dL Final  . HCT 07/21/2017 42.0  36.0 - 46.0 % Final  . MCV 07/21/2017 94.2  78.0 - 100.0 fL Final  . MCH 07/21/2017 31.2  26.0 - 34.0 pg Final  . MCHC 07/21/2017 33.1  30.0 - 36.0 g/dL Final  . RDW 07/21/2017 13.1  11.5 - 15.5 % Final  . Platelets 07/21/2017 197  150 - 400 K/uL Final  . Neutrophils Relative % 07/21/2017 48  % Final  . Neutro Abs 07/21/2017 3.5  1.7 - 7.7 K/uL Final  . Lymphocytes Relative 07/21/2017 35  % Final  . Lymphs Abs 07/21/2017 2.5  0.7 - 4.0 K/uL Final  . Monocytes Relative 07/21/2017 11  % Final  . Monocytes Absolute 07/21/2017 0.8  0.1 - 1.0 K/uL Final  . Eosinophils Relative 07/21/2017 6  % Final  . Eosinophils Absolute 07/21/2017 0.4  0.0 - 0.7 K/uL Final  . Basophils Relative 07/21/2017 0  % Final  . Basophils Absolute 07/21/2017 0.0  0.0 - 0.1 K/uL Final   Performed at [facility], [address], [place],  [address]  . Sodium 07/21/2017  135  135 - 145 mmol/L Final  . Potassium 07/21/2017 3.6  3.5 - 5.1 mmol/L Final  . Chloride 07/21/2017 95* 101 - 111 mmol/L Final  . CO2 07/21/2017 31  22 - 32 mmol/L Final  . Glucose, Bld 07/21/2017 115* 65 - 99 mg/dL Final  . BUN 07/21/2017 31* 6 - 20 mg/dL Final  . Creatinine, Ser 07/21/2017 1.67* 0.44 - 1.00 mg/dL Final  . Calcium 07/21/2017 9.3  8.9 - 10.3 mg/dL Final  . Total Protein 07/21/2017 6.8  6.5 - 8.1 g/dL Final  . Albumin 07/21/2017 3.4* 3.5 - 5.0 g/dL Final  . AST 07/21/2017 15  15 - 41 U/L Final  . ALT 07/21/2017 21  14 - 54 U/L Final  . Alkaline Phosphatase 07/21/2017 79  38 - 126 U/L Final  . Total Bilirubin 07/21/2017 0.4  0.3 - 1.2 mg/dL Final  . GFR calc non Af Amer 07/21/2017 29* >60 mL/min Final  . GFR calc Af Amer 07/21/2017 34* >60 mL/min Final   Comment: (NOTE) The eGFR has been calculated using the CKD EPI equation. This calculation has not been validated in all clinical situations. eGFR's persistently <60 mL/min signify possible Chronic Kidney Disease.   Georgiann Hahn gap 07/21/2017 9  5 - 15 Final   Performed at [facility], [address], [place], [place] [address]     Pathology Orders Placed This Encounter  Procedures  . Ambulatory Referral for Lung Cancer Screening    Referral Priority:   Routine    Referral Type:   Consultation    Referral Reason:   Specialty Services Required    Number of Visits Requested:   Devola MD

## 2017-08-01 ENCOUNTER — Other Ambulatory Visit: Payer: Self-pay | Admitting: Nurse Practitioner

## 2017-08-10 ENCOUNTER — Emergency Department (HOSPITAL_COMMUNITY): Payer: Medicare HMO

## 2017-08-10 ENCOUNTER — Emergency Department (HOSPITAL_COMMUNITY)
Admission: EM | Admit: 2017-08-10 | Discharge: 2017-08-10 | Disposition: A | Discharge status: Home / Self Care | Payer: Medicare HMO | Attending: Emergency Medicine | Admitting: Emergency Medicine

## 2017-08-10 ENCOUNTER — Encounter (HOSPITAL_COMMUNITY): Payer: Self-pay | Admitting: Emergency Medicine

## 2017-08-10 DIAGNOSIS — J9801 Acute bronchospasm: Secondary | ICD-10-CM

## 2017-08-10 DIAGNOSIS — Z96651 Presence of right artificial knee joint: Secondary | ICD-10-CM | POA: Diagnosis not present

## 2017-08-10 DIAGNOSIS — R0602 Shortness of breath: Secondary | ICD-10-CM | POA: Diagnosis present

## 2017-08-10 DIAGNOSIS — E039 Hypothyroidism, unspecified: Secondary | ICD-10-CM | POA: Diagnosis not present

## 2017-08-10 DIAGNOSIS — Z7982 Long term (current) use of aspirin: Secondary | ICD-10-CM | POA: Diagnosis not present

## 2017-08-10 DIAGNOSIS — I10 Essential (primary) hypertension: Secondary | ICD-10-CM | POA: Insufficient documentation

## 2017-08-10 DIAGNOSIS — Z79899 Other long term (current) drug therapy: Secondary | ICD-10-CM | POA: Diagnosis not present

## 2017-08-10 DIAGNOSIS — F17210 Nicotine dependence, cigarettes, uncomplicated: Secondary | ICD-10-CM | POA: Insufficient documentation

## 2017-08-10 DIAGNOSIS — J40 Bronchitis, not specified as acute or chronic: Secondary | ICD-10-CM | POA: Insufficient documentation

## 2017-08-10 LAB — BASIC METABOLIC PANEL
Anion gap: 9 (ref 5–15)
BUN: 18 mg/dL (ref 6–20)
CALCIUM: 9.6 mg/dL (ref 8.9–10.3)
CO2: 32 mmol/L (ref 22–32)
CREATININE: 1.06 mg/dL — AB (ref 0.44–1.00)
Chloride: 97 mmol/L — ABNORMAL LOW (ref 101–111)
GFR calc non Af Amer: 50 mL/min — ABNORMAL LOW (ref >60)
GFR, EST AFRICAN AMERICAN: 58 mL/min — AB (ref >60)
Glucose, Bld: 126 mg/dL — ABNORMAL HIGH (ref 65–99)
Potassium: 3.1 mmol/L — ABNORMAL LOW (ref 3.5–5.1)
SODIUM: 138 mmol/L (ref 135–145)

## 2017-08-10 LAB — CBC
HCT: 41.7 % (ref 36.0–46.0)
Hemoglobin: 13.5 g/dL (ref 12.0–15.0)
MCH: 30.5 pg (ref 26.0–34.0)
MCHC: 32.4 g/dL (ref 30.0–36.0)
MCV: 94.3 fL (ref 78.0–100.0)
PLATELETS: 147 10*3/uL — AB (ref 150–400)
RBC: 4.42 MIL/uL (ref 3.87–5.11)
RDW: 14 % (ref 11.5–15.5)
WBC: 7.2 10*3/uL (ref 4.0–10.5)

## 2017-08-10 LAB — TROPONIN I
TROPONIN I: 0.09 ng/mL — AB (ref <0.03)
TROPONIN I: 0.08 ng/mL — AB (ref <0.03)

## 2017-08-10 LAB — BRAIN NATRIURETIC PEPTIDE: B Natriuretic Peptide: 100 pg/mL (ref 0.0–100.0)

## 2017-08-10 MED ORDER — FLUTICASONE PROPIONATE 50 MCG/ACT NA SUSP: 2.0000 | Freq: Every day | NASAL | 2 refills | Status: DC

## 2017-08-10 MED ORDER — FLUTICASONE PROPIONATE 50 MCG/ACT NA SUSP
2.0000 | NASAL | Status: AC
  Administered 2017-08-10: 2 via NASAL
  Filled 2017-08-10: qty 16

## 2017-08-10 MED ORDER — BENZONATATE 100 MG PO CAPS: 100.0000 mg | ORAL_CAPSULE | Freq: Three times a day (TID) | ORAL | 0 refills | Status: DC

## 2017-08-10 MED ORDER — ALBUTEROL SULFATE HFA 108 (90 BASE) MCG/ACT IN AERS: 2.0000 | INHALATION_SPRAY | RESPIRATORY_TRACT | 3 refills | Status: DC | PRN

## 2017-08-10 MED ORDER — ALBUTEROL SULFATE (2.5 MG/3ML) 0.083% IN NEBU
5.0000 mg | INHALATION_SOLUTION | Freq: Once | RESPIRATORY_TRACT | Status: AC
  Administered 2017-08-10: 5 mg via RESPIRATORY_TRACT
  Filled 2017-08-10: qty 6

## 2017-08-10 NOTE — ED Notes (Signed)
CRITICAL VALUE ALERT  Critical Value:  Troponin 0.09  Date & Time Notied:  08/10/17 1041  Provider Notified: Hyacinth Meeker MD  Orders Received/Actions taken: none

## 2017-08-10 NOTE — ED Notes (Signed)
Patient transported to X-ray 

## 2017-08-10 NOTE — Discharge Instructions (Signed)
Your testing today has not shown any specific abnormalities including with your blood work or your x-ray.  There is no signs of pneumonia and with your nasal symptoms and your coughing it seems that this is likely related to a virus which may last for another 7 to 14 days. Please take albuterol inhaler 2 puffs every 4 hours as needed for coughing wheezing or shortness of breath Tessalon 100 mg every 8 hours as needed for coughing Emergency department for severe or worsening symptoms including severe chest pain difficulty breathing fevers or generalized weakness  Please see your family doctor within 2 days for recheck

## 2017-08-10 NOTE — ED Provider Notes (Signed)
[facility] EMERGENCY DEPARTMENT Provider Note   CSN: [id number] Arrival date & time: 08/10/17  3500     History   Chief Complaint Chief Complaint  Patient presents with  . Shortness of Breath    HPI Connie Ponce is a 72 y.o. female.  HPI  72 year old female, known history of COPD as well as pneumonia, hypertension, hyperlipidemia and a prior history of stroke.  She presents with family members reporting that she has had shortness of breath that started yesterday, coughing that started 2 days ago, subjective fevers and chills.  She denies swelling of her legs, symptoms have been persistent, unrelieved with albuterol inhalers at home.  Not associated with rashes, chest pain, headache.  She does have a runny nose.  Symptoms are currently moderate, they have been severe at worst.  She states she has never had any cardiac abnormalities in the past and has never had a heart attack.  Review of the medical record shows that her EKG from 2 years ago showed a left bundle branch block similar to what we are seeing today.  Past Medical History:  Diagnosis Date  . Anxiety   . Carpal tunnel syndrome   . COPD (chronic obstructive pulmonary disease) ([place])   . Gastric ulcer   . GERD (gastroesophageal reflux disease)   . Hyperglycemia   . Hyperlipidemia   . Hypertension   . Hypothyroidism   . Osteoarthritis   . Peptic ulcer disease   . Pneumonia 2010  . Renal insufficiency   . 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 Stroke ([place]) 1990s   mild  . TIA (transient ischemic attack)     Patient Active Problem List   Diagnosis Date Noted  . Abdominal mass, RUQ (right upper quadrant) 03/13/2017  . Abdominal pain 03/13/2017  . RUQ pain 03/13/2017  . Dysphagia   . TIA (transient ischemic attack) 01/03/2016  . Absolute anemia 07/03/2015  . Proctalgia   . Rectal pain 01/04/2015  . Hemorrhoids 01/04/2015  . Diarrhea 01/04/2015  . Gastric ulceration   . Gastric ulcer 11/29/2014  . Toxic  metabolic encephalopathy 93/81/8299  . UTI (lower urinary tract infection) 09/19/2014  . ARF (acute renal failure) ([place])   . LUQ pain 07/28/2014  . Nausea with vomiting 07/28/2014  . Bilateral lower extremity edema 07/28/2014  . Thrombocytopenia ([place]) 04/12/2014  . RLQ abdominal pain 01/22/2012  . Constipation 01/22/2012  . GERD (gastroesophageal reflux disease) 01/22/2012    Past Surgical History:  Procedure Laterality Date  . ABDOMINAL HYSTERECTOMY    . APPENDECTOMY    . BACK SURGERY    . BONE MARROW ASPIRATION Left 04/22/14  . BONE MARROW BIOPSY Left 04/22/14  . CHOLECYSTECTOMY    . COLONOSCOPY     ?2005, [place]  . COLONOSCOPY N/A 04/07/2015   BZJ:IRCVEL  . COLONOSCOPY WITH ESOPHAGOGASTRODUODENOSCOPY (EGD)  02/11/2012   RMR: Ulcerative/erosive reflux esophagitis, benign appearing gastric ulcer with unremarkable biopsy, no H pylori. Colonoscopy was unremarkable. Next colonoscopy in 2023  . CYSTECTOMY     cyst removed from rectal area.   . ESOPHAGOGASTRODUODENOSCOPY  01/05/2003   RMR: Normal esophagus, small hiatal hernia/ A couple of tiny antral erosions, otherwise normal stomach normal D1 and D2.   . ESOPHAGOGASTRODUODENOSCOPY N/A 08/11/2014   FYB:OFBPZWC ulcer/HH  . ESOPHAGOGASTRODUODENOSCOPY N/A 12/01/2014   HEN:[id number] improved gastric ulcerations/p bx  . ESOPHAGOGASTRODUODENOSCOPY N/A 01/31/2016   Procedure: ESOPHAGOGASTRODUODENOSCOPY (EGD);  Surgeon: Daneil Dolin, MD;  Location: [facility] ENDO SUITE;  Service: Endoscopy;  Laterality: N/A;  745  . HEMORRHOID SURGERY    . JOINT REPLACEMENT Right    knee  . MALONEY DILATION N/A 01/31/2016   Procedure: Venia Minks DILATION;  Surgeon: Daneil Dolin, MD;  Location: [facility] ENDO SUITE;  Service: Endoscopy;  Laterality: N/A;     OB History    Gravida  4   Para      Term      Preterm      AB      Living  3     SAB      TAB      Ectopic      Multiple      Live Births               Home Medications     Prior to Admission medications   Medication Sig Start Date End Date Taking? Authorizing Provider  aspirin EC 81 MG EC tablet Take 2 tablets (162 mg total) by mouth daily. 01/05/16  Yes Thurnell Lose, MD  budesonide-formoterol [facility]) 160-4.5 MCG/ACT inhaler Inhale 2 puffs into the lungs 2 (two) times daily as needed (shortness of breath).    Yes [provider]  carvedilol (COREG) 12.5 MG tablet 12.5 mg 2 (two) times daily with a meal.  07/08/17  Yes [provider]  Cholecalciferol (VITAMIN D-3) 5000 UNITS TABS Take 1 tablet by mouth daily.   Yes [provider]  DEXILANT 60 MG capsule TAKE (1) CAPSULE BY MOUTH ONCE DAILY. 08/02/17  Yes Mahala Menghini, PA-C  diltiazem [facility]) 360 MG 24 hr capsule  07/08/17  Yes [provider]  dimethicone 1 % cream Apply 1 application topically 2 (two) times daily as needed for dry skin.   Yes [provider]  gabapentin (NEURONTIN) 300 MG capsule 300 mg 3 (three) times daily.  07/08/17  Yes [provider]  hydrocortisone 2.5 % cream  06/24/17  Yes [provider]  levothyroxine (SYNTHROID, LEVOTHROID) 125 MCG tablet Take 125 mcg by mouth daily before breakfast.   Yes [provider]  MOVANTIK 25 MG TABS tablet TAKE 1 TABLET BY MOUTH ONCE DAILY. TAKE 1 HOUR BEFORE OR TWO HOURS AFTER MEAL 07/08/17  Yes Mahala Menghini, PA-C  oxybutynin (DITROPAN) 5 MG tablet TAKE (1) TABLET BY MOUTH THREE TIMES DAILY 04/26/17  Yes Florian Buff, MD  oxyCODONE-acetaminophen (PERCOCET/ROXICET) 5-325 MG tablet 1 tablet every 6 (six) hours as needed for moderate pain or severe pain.  06/09/15  Yes [provider]  polyethylene glycol powder (GLYCOLAX/MIRALAX) powder 17 grams orally daily for constipation 03/13/17  Yes Mahala Menghini, PA-C  potassium chloride SA (K-DUR,KLOR-CON) 20 MEQ tablet  07/08/17  Yes [provider]  pravastatin (PRAVACHOL) 40 MG tablet Take 40 mg by mouth daily.   Yes  [provider]  ranitidine (ZANTAC) 150 MG tablet Take 150 mg by mouth at bedtime.  12/08/14  Yes [provider]  sertraline (ZOLOFT) 50 MG tablet  07/08/17  Yes [provider]  torsemide (DEMADEX) 20 MG tablet 10 mg daily.  07/08/17  Yes [provider]  triamcinolone cream (KENALOG) 0.5 % Apply 1 application topically 3 (three) times daily. 03/28/16  Yes Florian Buff, MD  triamterene-hydrochlorothiazide (MAXZIDE-25) 37.5-25 MG per tablet Take 1 tablet by mouth daily.   Yes [provider]  albuterol (PROVENTIL HFA;VENTOLIN HFA) 108 (90 Base) MCG/ACT inhaler Inhale 2 puffs into the lungs every 4 (four) hours as needed for wheezing or shortness  of breath. 08/10/17   Noemi Chapel, MD  benzonatate (TESSALON) 100 MG capsule Take 1 capsule (100 mg total) by mouth every 8 (eight) hours. 08/10/17   Noemi Chapel, MD  fluticasone (FLONASE) 50 MCG/ACT nasal spray Place 2 sprays into both nostrils daily. 08/10/17   Noemi Chapel, MD    Family History Family History  Problem Relation Age of Onset  . Other Mother        died age 38, natural causes  . Stroke Mother   . Diabetes Daughter   . Healthy Son   . Healthy Daughter   . Healthy Daughter   . Colon cancer Neg Hx   . Liver disease Neg Hx     Social History Social History   Tobacco Use  . Smoking status: Current Every Day Smoker    Packs/day: 0.25    Types: Cigarettes  . Smokeless tobacco: Never Used  Substance Use Topics  . Alcohol use: No    Alcohol/week: 0.0 oz  . Drug use: No     Allergies   Tetracyclines & related and Shellfish-derived products   Review of Systems Review of Systems  All other systems reviewed and are negative.    Physical Exam Updated Vital Signs BP (!) 167/62 (BP Location: Left Arm)   Pulse 96   Temp 98.3 F (36.8 C) (Oral)   Resp (!) 22   Ht 5' 4"  (1.626 m)   Wt 86.2 kg (190 lb)   SpO2 100%   BMI 32.61 kg/m   Physical Exam  Constitutional: She  appears well-developed and well-nourished. No distress.  HENT:  Head: Normocephalic and atraumatic.  Mouth/Throat: Oropharynx is clear and moist. No oropharyngeal exudate.  Eyes: Pupils are equal, round, and reactive to light. Conjunctivae and EOM are normal. Right eye exhibits no discharge. Left eye exhibits no discharge. No scleral icterus.  Neck: Normal range of motion. Neck supple. No JVD present. No thyromegaly present.  Cardiovascular: Normal rate, regular rhythm, normal heart sounds and intact distal pulses. Exam reveals no gallop and no friction rub.  No murmur heard. Pulmonary/Chest: She is in respiratory distress. She has wheezes. She has no rales.  Slight increased work of breathing and tachypnea, diffuse expiratory wheezing with a prolonged expiratory phase, no rales, speaks in shortened sentences.  Abdominal: Soft. Bowel sounds are normal. She exhibits no distension and no mass. There is no tenderness.  Musculoskeletal: Normal range of motion. She exhibits no edema or tenderness.  Lymphadenopathy:    She has no cervical adenopathy.  Neurological: She is alert. Coordination normal.  Skin: Skin is warm and dry. No rash noted. No erythema.  Psychiatric: She has a normal mood and affect. Her behavior is normal.  Nursing note and vitals reviewed.    ED Treatments / Results  Labs (all labs ordered are listed, but only abnormal results are displayed) Labs Reviewed  BASIC METABOLIC PANEL - Abnormal; Notable for the following components:      Result Value   Potassium 3.1 (*)    Chloride 97 (*)    Glucose, Bld 126 (*)    Creatinine, Ser 1.06 (*)    GFR calc non Af Amer 50 (*)    GFR calc Af Amer 58 (*)    All other components within normal limits  CBC - Abnormal; Notable for the following components:   Platelets 147 (*)    All other components within normal limits  TROPONIN I - Abnormal; Notable for the following components:   Troponin I  0.09 (*)    All other components  within normal limits  TROPONIN I - Abnormal; Notable for the following components:   Troponin I 0.08 (*)    All other components within normal limits  BRAIN NATRIURETIC PEPTIDE    EKG None  ED ECG REPORT  I personally interpreted this EKG   Date: 08/10/2017   Rate: Aug 10, 2017, 9:16 AM, rate of 90  Rhythm: normal sinus rhythm  QRS Axis: left  Intervals: normal  ST/T Wave abnormalities: nonspecific ST/T changes  Conduction Disutrbances:left bundle branch block  Narrative Interpretation:   Old EKG Reviewed: unchanged   Radiology Dg Chest 2 View  Result Date: 08/10/2017 CLINICAL DATA:  Productive cough and shortness of breath today. EXAM: CHEST - 2 VIEW COMPARISON:  01/03/2016. FINDINGS: Normal sized heart. Mildly prominent pulmonary vasculature without significant change. Clear lungs. Thoracic spine and bilateral shoulder degenerative changes. IMPRESSION: No acute abnormality. Electronically Signed   By: Claudie Revering M.D.   On: 08/10/2017 10:19    Procedures Procedures (including critical care time)  Medications Ordered in ED Medications  fluticasone (FLONASE) 50 MCG/ACT nasal spray 2 spray (has no administration in time range)  albuterol (PROVENTIL) (2.5 MG/3ML) 0.083% nebulizer solution 5 mg (5 mg Nebulization Given 08/10/17 0933)     Initial Impression / Assessment and Plan / ED Course  I have reviewed the triage vital signs and the nursing notes.  Pertinent labs & imaging results that were available during my care of the patient were reviewed by me and considered in my medical decision making (see chart for details).  Clinical Course as of Aug 11 1334  Sun Aug 10, 2017  1245 Reexamined at 1245, heart rate is around 95 bpm, lung sounds are slightly improved but still with expiratory wheezing.  Oxygenation is 99% on room air, she has increased nasal congestion and a runny nose which looks like clear rhinorrhea.  She will be given some fluticasone nasal spray while second  troponin is pending.   [BM]  1333 Second troponin is essentially unchanged if not decreased from the first, this makes it much less likely to be cardiac in nature, the patient will be discharged home, she is in improved condition, this looks most likely to be bronchospasm secondary to a viral respiratory process  Troponin I(!!) [BM]    Clinical Course User Index [BM] Noemi Chapel, MD    Patient has some increased work of breathing associated with diffuse wheezing which may be related to a COPD exacerbation, would also consider pneumonia, less likely to be cardiac, EKG unchanged from prior.  Patient informed of results, stable for discharge, she agrees with the plan  Final Clinical Impressions(s) / ED Diagnoses   Final diagnoses:  Bronchospasm    ED Discharge Orders        Ordered    albuterol (PROVENTIL HFA;VENTOLIN HFA) 108 (90 Base) MCG/ACT inhaler  Every 4 hours PRN     08/10/17 1335    benzonatate (TESSALON) 100 MG capsule  Every 8 hours     08/10/17 1335    fluticasone (FLONASE) 50 MCG/ACT nasal spray  Daily     08/10/17 1335       Noemi Chapel, MD 08/10/17 1336

## 2017-08-10 NOTE — ED Notes (Signed)
Ambulated patient to bathroom. Patient became extremely dizzy and unsteady on her feet and had to be wheeled back to room and assisted into bed. Patient states she does not use any assistive ambulatory devices at home. RN Fleet Contras notified

## 2017-08-10 NOTE — ED Triage Notes (Signed)
Pt reports she went to pcp last week for cough and was given nebs.  Began having more trouble breathing last night with increased coughing.  Used neb and inhaler this morning with no relief.

## 2017-09-23 ENCOUNTER — Other Ambulatory Visit: Payer: Self-pay | Admitting: Acute Care

## 2017-09-23 DIAGNOSIS — F17210 Nicotine dependence, cigarettes, uncomplicated: Principal | ICD-10-CM

## 2017-09-23 DIAGNOSIS — Z122 Encounter for screening for malignant neoplasm of respiratory organs: Secondary | ICD-10-CM

## 2017-10-01 ENCOUNTER — Telehealth: Payer: Self-pay

## 2017-10-01 NOTE — Telephone Encounter (Signed)
PA for Movantik 25 mg was received through [email or web address]. After completing PA, it stated that authorization wasn't needed. Spoke witth pharmacy and no PA is required anymore.

## 2017-10-03 ENCOUNTER — Emergency Department (HOSPITAL_COMMUNITY): Payer: Medicare HMO

## 2017-10-03 ENCOUNTER — Encounter (HOSPITAL_COMMUNITY): Payer: Self-pay | Admitting: Emergency Medicine

## 2017-10-03 ENCOUNTER — Other Ambulatory Visit: Payer: Self-pay

## 2017-10-03 ENCOUNTER — Observation Stay (HOSPITAL_BASED_OUTPATIENT_CLINIC_OR_DEPARTMENT_OTHER): Payer: Medicare HMO

## 2017-10-03 ENCOUNTER — Observation Stay (HOSPITAL_COMMUNITY): Payer: Medicare HMO

## 2017-10-03 ENCOUNTER — Observation Stay (HOSPITAL_COMMUNITY)
Admission: EM | Admit: 2017-10-03 | Discharge: 2017-10-04 | Disposition: A | Discharge status: Home / Self Care | Payer: Medicare HMO | Attending: Internal Medicine | Admitting: Internal Medicine

## 2017-10-03 DIAGNOSIS — F17210 Nicotine dependence, cigarettes, uncomplicated: Secondary | ICD-10-CM | POA: Diagnosis not present

## 2017-10-03 DIAGNOSIS — R471 Dysarthria and anarthria: Secondary | ICD-10-CM | POA: Diagnosis not present

## 2017-10-03 DIAGNOSIS — I5030 Unspecified diastolic (congestive) heart failure: Secondary | ICD-10-CM | POA: Diagnosis not present

## 2017-10-03 DIAGNOSIS — J449 Chronic obstructive pulmonary disease, unspecified: Secondary | ICD-10-CM | POA: Diagnosis not present

## 2017-10-03 DIAGNOSIS — N390 Urinary tract infection, site not specified: Secondary | ICD-10-CM | POA: Diagnosis not present

## 2017-10-03 DIAGNOSIS — G459 Transient cerebral ischemic attack, unspecified: Secondary | ICD-10-CM | POA: Diagnosis not present

## 2017-10-03 DIAGNOSIS — E039 Hypothyroidism, unspecified: Secondary | ICD-10-CM | POA: Diagnosis not present

## 2017-10-03 DIAGNOSIS — R2681 Unsteadiness on feet: Secondary | ICD-10-CM

## 2017-10-03 DIAGNOSIS — Z96651 Presence of right artificial knee joint: Secondary | ICD-10-CM | POA: Diagnosis not present

## 2017-10-03 DIAGNOSIS — R4781 Slurred speech: Secondary | ICD-10-CM

## 2017-10-03 DIAGNOSIS — Z79899 Other long term (current) drug therapy: Secondary | ICD-10-CM | POA: Diagnosis not present

## 2017-10-03 DIAGNOSIS — I10 Essential (primary) hypertension: Secondary | ICD-10-CM | POA: Diagnosis not present

## 2017-10-03 DIAGNOSIS — Z7982 Long term (current) use of aspirin: Secondary | ICD-10-CM | POA: Insufficient documentation

## 2017-10-03 DIAGNOSIS — G9341 Metabolic encephalopathy: Secondary | ICD-10-CM | POA: Diagnosis present

## 2017-10-03 LAB — CBG MONITORING, ED: Glucose-Capillary: 86 mg/dL (ref 70–99)

## 2017-10-03 LAB — COMPREHENSIVE METABOLIC PANEL
ALK PHOS: 77 U/L (ref 38–126)
ALT: 13 U/L (ref 0–44)
ANION GAP: 8 (ref 5–15)
AST: 15 U/L (ref 15–41)
Albumin: 3.2 g/dL — ABNORMAL LOW (ref 3.5–5.0)
BILIRUBIN TOTAL: 0.5 mg/dL (ref 0.3–1.2)
BUN: 27 mg/dL — ABNORMAL HIGH (ref 8–23)
CO2: 27 mmol/L (ref 22–32)
CREATININE: 1.37 mg/dL — AB (ref 0.44–1.00)
Calcium: 9.2 mg/dL (ref 8.9–10.3)
Chloride: 105 mmol/L (ref 98–111)
GFR, EST AFRICAN AMERICAN: 43 mL/min — AB (ref >60)
GFR, EST NON AFRICAN AMERICAN: 37 mL/min — AB (ref >60)
Glucose, Bld: 88 mg/dL (ref 70–99)
Potassium: 4.4 mmol/L (ref 3.5–5.1)
Sodium: 140 mmol/L (ref 135–145)
TOTAL PROTEIN: 6.6 g/dL (ref 6.5–8.1)

## 2017-10-03 LAB — CBC
HEMATOCRIT: 38.4 % (ref 36.0–46.0)
HEMOGLOBIN: 13.7 g/dL (ref 12.0–15.0)
MCH: 33.5 pg (ref 26.0–34.0)
MCHC: 35.7 g/dL (ref 30.0–36.0)
MCV: 93.9 fL (ref 78.0–100.0)
Platelets: 154 10*3/uL (ref 150–400)
RBC: 4.09 MIL/uL (ref 3.87–5.11)
RDW: 12.7 % (ref 11.5–15.5)
WBC: 6.8 10*3/uL (ref 4.0–10.5)

## 2017-10-03 LAB — ETHANOL: Alcohol, Ethyl (B): <10 mg/dL (ref <10)

## 2017-10-03 LAB — BLOOD GAS, ARTERIAL
Acid-Base Excess: 1.6 mmol/L (ref 0.0–2.0)
Bicarbonate: 25.5 mmol/L (ref 20.0–28.0)
Drawn by: 22223
FIO2: 28
O2 CONTENT: 2 L/min
O2 Saturation: 94.3 %
PCO2 ART: 44.5 mmHg (ref 32.0–48.0)
PH ART: 7.387 (ref 7.350–7.450)
pO2, Arterial: 76.3 mmHg — ABNORMAL LOW (ref 83.0–108.0)

## 2017-10-03 LAB — URINALYSIS, ROUTINE W REFLEX MICROSCOPIC
Bilirubin Urine: NEGATIVE
Glucose, UA: NEGATIVE mg/dL
HGB URINE DIPSTICK: NEGATIVE
Ketones, ur: NEGATIVE mg/dL
Leukocytes, UA: NEGATIVE
Nitrite: POSITIVE — AB
PROTEIN: NEGATIVE mg/dL
Specific Gravity, Urine: 1.016 (ref 1.005–1.030)
pH: 5 (ref 5.0–8.0)

## 2017-10-03 LAB — VITAMIN B12: Vitamin B-12: 244 pg/mL (ref 180–914)

## 2017-10-03 LAB — DIFFERENTIAL
Basophils Absolute: 0 10*3/uL (ref 0.0–0.1)
Basophils Relative: 0 %
EOS PCT: 7 %
Eosinophils Absolute: 0.5 10*3/uL (ref 0.0–0.7)
LYMPHS PCT: 14 %
Lymphs Abs: 1 10*3/uL (ref 0.7–4.0)
MONO ABS: 0.7 10*3/uL (ref 0.1–1.0)
MONOS PCT: 10 %
NEUTROS ABS: 4.7 10*3/uL (ref 1.7–7.7)
Neutrophils Relative %: 69 %

## 2017-10-03 LAB — LACTIC ACID, PLASMA
LACTIC ACID, VENOUS: 0.7 mmol/L (ref 0.5–1.9)
Lactic Acid, Venous: 0.7 mmol/L (ref 0.5–1.9)

## 2017-10-03 LAB — RAPID URINE DRUG SCREEN, HOSP PERFORMED
AMPHETAMINES: NOT DETECTED
Benzodiazepines: NOT DETECTED
Cocaine: NOT DETECTED
Opiates: POSITIVE — AB
Tetrahydrocannabinol: NOT DETECTED

## 2017-10-03 LAB — ECHOCARDIOGRAM COMPLETE
HEIGHTINCHES: 65 in
WEIGHTICAEL: 3152 [oz_av]

## 2017-10-03 LAB — I-STAT TROPONIN, ED: TROPONIN I, POC: 0 ng/mL (ref 0.00–0.08)

## 2017-10-03 LAB — APTT: aPTT: 27 seconds (ref 24–36)

## 2017-10-03 LAB — FOLATE: Folate: 13.9 ng/mL (ref >5.9)

## 2017-10-03 LAB — PROTIME-INR
INR: 0.96
Prothrombin Time: 12.7 seconds (ref 11.4–15.2)

## 2017-10-03 LAB — TSH: TSH: 1.653 u[IU]/mL (ref 0.350–4.500)

## 2017-10-03 MED ORDER — ACETAMINOPHEN 160 MG/5ML PO SOLN: 650.0000 mg | ORAL | Status: DC | PRN

## 2017-10-03 MED ORDER — ENOXAPARIN SODIUM 40 MG/0.4ML ~~LOC~~ SOLN
40.0000 mg | SUBCUTANEOUS | Status: DC
  Administered 2017-10-03: 40 mg via SUBCUTANEOUS
  Filled 2017-10-03: qty 0.4

## 2017-10-03 MED ORDER — SODIUM CHLORIDE 0.9 % IV SOLN
1.0000 g | Freq: Once | INTRAVENOUS | Status: AC
  Administered 2017-10-03: 1 g via INTRAVENOUS
  Filled 2017-10-03: qty 10

## 2017-10-03 MED ORDER — STROKE: EARLY STAGES OF RECOVERY BOOK
Freq: Once | Status: AC
  Administered 2017-10-03: 18:00:00
  Filled 2017-10-03 (×2): qty 1

## 2017-10-03 MED ORDER — IPRATROPIUM-ALBUTEROL 0.5-2.5 (3) MG/3ML IN SOLN
3.0000 mL | Freq: Four times a day (QID) | RESPIRATORY_TRACT | Status: DC
  Administered 2017-10-03 – 2017-10-04 (×3): 3 mL via RESPIRATORY_TRACT
  Filled 2017-10-03 (×3): qty 3

## 2017-10-03 MED ORDER — SODIUM CHLORIDE 0.9 % IV SOLN
INTRAVENOUS | Status: DC
  Administered 2017-10-03: 18:00:00 via INTRAVENOUS

## 2017-10-03 MED ORDER — ASPIRIN EC 81 MG PO TBEC
162.0000 mg | DELAYED_RELEASE_TABLET | Freq: Every day | ORAL | Status: DC
  Administered 2017-10-04: 162 mg via ORAL
  Filled 2017-10-03: qty 2

## 2017-10-03 MED ORDER — LEVOTHYROXINE SODIUM 100 MCG PO TABS
125.0000 ug | ORAL_TABLET | Freq: Every day | ORAL | Status: DC
  Administered 2017-10-04: 125 ug via ORAL
  Filled 2017-10-03: qty 1

## 2017-10-03 MED ORDER — SERTRALINE HCL 50 MG PO TABS
50.0000 mg | ORAL_TABLET | Freq: Every day | ORAL | Status: DC
  Administered 2017-10-04: 50 mg via ORAL
  Filled 2017-10-03 (×2): qty 1

## 2017-10-03 MED ORDER — MOMETASONE FURO-FORMOTEROL FUM 200-5 MCG/ACT IN AERO
2.0000 | INHALATION_SPRAY | Freq: Two times a day (BID) | RESPIRATORY_TRACT | Status: DC
  Administered 2017-10-03 – 2017-10-04 (×2): 2 via RESPIRATORY_TRACT
  Filled 2017-10-03 (×2): qty 8.8

## 2017-10-03 MED ORDER — FUROSEMIDE 20 MG PO TABS
20.0000 mg | ORAL_TABLET | Freq: Every day | ORAL | Status: DC
  Administered 2017-10-04: 20 mg via ORAL
  Filled 2017-10-03 (×2): qty 1

## 2017-10-03 MED ORDER — PRAVASTATIN SODIUM 40 MG PO TABS
40.0000 mg | ORAL_TABLET | Freq: Every day | ORAL | Status: DC
  Administered 2017-10-04: 40 mg via ORAL
  Filled 2017-10-03 (×6): qty 1

## 2017-10-03 MED ORDER — ACETAMINOPHEN 325 MG PO TABS
650.0000 mg | ORAL_TABLET | ORAL | Status: DC | PRN
  Administered 2017-10-04: 650 mg via ORAL
  Filled 2017-10-03 (×2): qty 2

## 2017-10-03 MED ORDER — HYDRALAZINE HCL 20 MG/ML IJ SOLN: 10.0000 mg | Freq: Four times a day (QID) | INTRAMUSCULAR | Status: DC | PRN

## 2017-10-03 MED ORDER — ACETAMINOPHEN 650 MG RE SUPP: 650.0000 mg | RECTAL | Status: DC | PRN

## 2017-10-03 MED ORDER — SENNOSIDES-DOCUSATE SODIUM 8.6-50 MG PO TABS
1.0000 | ORAL_TABLET | Freq: Every evening | ORAL | Status: DC | PRN
  Filled 2017-10-03 (×2): qty 1

## 2017-10-03 NOTE — Progress Notes (Signed)
Code Stroke Times:  1057 Called  1059 Beeped 1105 Exam Started  1108 Exam finished  1110 Images sent  1110 Exam Completed 1109 GR called

## 2017-10-03 NOTE — H&P (Signed)
History and Physical  Connie Ponce CHY:[id number] DOB: 10/01/1945 DOA: 10/03/2017   PCP: [name], NP   Patient coming from: Home  Chief Complaint:, Gait instability Slurred speech HPI:  Connie Ponce is a 72 y.o. female with medical history of hypertension, chronic back pain, hypothyroidism, COPD, TIA presenting with slurred speech and somnolence.  Unfortunately, the patient is a poor historian.  Most of the history is obtained from speaking with the patient's spouse at the bedside and reviewed the medical record.  Apparently, the patient has had some dizziness and gait instability for several weeks off and on.  Her husband who works second shift called her on the evening of 10/02/2017.  He noted that her speech was a bit slurred at that time.  Nevertheless, she went to bed for the rest of the night.  When the patient woke up around 5 AM on 10/03/2017, her husband noted the patient to have slurred speech, and he stated that " she was not herself."  Nevertheless, the patient went to work.  Apparently 1 of her coworkers noted the patient have slurred speech and contacted the patient's spouse to come pick her up.  Her husband subsequently brought her to the emergency department.  He states that her slurred speech has improved since going to the emergency department, but she remains somnolent.  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  The patient continues to smoke about 1/2 pack/day.  She states that she takes approximately 2 Percocet daily for pain but denies overuse.  She denies any chest pain, shortness breath, fevers, chills, nausea or vomiting, diarrhea, abdominal pain. In the emergency department, the patient was afebrile hemodynamically stable saturating 95% on room air.  BMP, CBC, and LFTs were unremarkable.  CT of the brain was negative.  MRI of the brain was negative for acute findings.  Chest x-ray was negative.  The patient was admitted  for further work up.  Assessment/Plan: Acute metabolic encephalopathy -Likely partly due to polypharmacy -Check UA and urine culture -Check ABG -Serum B12 -TSH -RPR -Discontinue gabapentin and oxycodone and trazodone  Gait instability/slurred speech -Neurology Consult -PT/OT evaluation -Speech therapy eval -CT brain--neg -MRI brain--neg -MRA brain--neg -Carotid Duplex-- -Echo-- -LDL-- -HbA1C-- -Antiplatelet--ASA 81 mg  Essential hypertension -Holding carvedilol, diltiazem, Maxide to allow for permissive hypertension  CKD stage III -Baseline creatinine 1.0-1.3  COPD -Stable on room air -Continue Symbicort -Start duo nebs  Hypothyroidism -Continue Synthroid -Check TSH  Hyperlipidemia -Continue statin        Past Medical History:  Diagnosis Date  . Anxiety   . Carpal tunnel syndrome   . COPD (chronic obstructive pulmonary disease) ([place])   . Gastric ulcer   . GERD (gastroesophageal reflux disease)   . Hyperglycemia   . Hyperlipidemia   . Hypertension   . Hypothyroidism   . Osteoarthritis   . Peptic ulcer disease   . Pneumonia 2010  . Renal insufficiency   . 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 Stroke ([place]) 1990s   mild  . TIA (transient ischemic attack)    Past Surgical History:  Procedure Laterality Date  . ABDOMINAL HYSTERECTOMY    . APPENDECTOMY    . BACK SURGERY    . BONE MARROW ASPIRATION Left 04/22/14  . BONE MARROW BIOPSY Left 04/22/14  . CHOLECYSTECTOMY    . COLONOSCOPY     ?2005, [facility]  . COLONOSCOPY N/A 04/07/2015   INO:[facility]  .  COLONOSCOPY WITH ESOPHAGOGASTRODUODENOSCOPY (EGD)  02/11/2012   RMR: Ulcerative/erosive reflux esophagitis, benign appearing gastric ulcer with unremarkable biopsy, no H pylori. Colonoscopy was unremarkable. Next colonoscopy in 2023  . CYSTECTOMY     cyst removed from rectal area.   . ESOPHAGOGASTRODUODENOSCOPY  01/05/2003   RMR: Normal esophagus, small hiatal hernia/ A couple of tiny antral  erosions, otherwise normal stomach normal D1 and D2.   . ESOPHAGOGASTRODUODENOSCOPY N/A 08/11/2014   OBS:[id number] ulcer/HH  . ESOPHAGOGASTRODUODENOSCOPY N/A 12/01/2014   QHU:[id number] improved gastric ulcerations/p bx  . ESOPHAGOGASTRODUODENOSCOPY N/A 01/31/2016   Procedure: ESOPHAGOGASTRODUODENOSCOPY (EGD);  Surgeon: Daneil Dolin, MD;  Location: [facility];  Service: Endoscopy;  Laterality: N/A;  745  . HEMORRHOID SURGERY    . JOINT REPLACEMENT Right    knee  . MALONEY DILATION N/A 01/31/2016   Procedure: Venia Minks DILATION;  Surgeon: Daneil Dolin, MD;  Location: [facility];  Service: Endoscopy;  Laterality: N/A;   Social History:  reports that she has been smoking cigarettes.  She has been smoking about 0.25 packs per day. She has never used smokeless tobacco. She reports that she does not drink alcohol or use drugs.   Family History  Problem Relation Age of Onset  . Other Mother        died age 28, natural causes  . Stroke Mother   . Diabetes Daughter   . Healthy Son   . Healthy Daughter   . Healthy Daughter   . Colon cancer Neg Hx   . Liver disease Neg Hx      Allergies  Allergen Reactions  . Tetracyclines & Related   . Shellfish-Derived Products Nausea And Vomiting     Prior to Admission medications   Medication Sig Start Date End Date Taking? Authorizing Provider  albuterol (PROVENTIL HFA;VENTOLIN HFA) 108 (90 Base) MCG/ACT inhaler Inhale 2 puffs into the lungs every 4 (four) hours as needed for wheezing or shortness of breath. 08/10/17  Yes Noemi Chapel, MD  aspirin EC 81 MG EC tablet Take 2 tablets (162 mg total) by mouth daily. 01/05/16  Yes Thurnell Lose, MD  benzonatate (TESSALON) 100 MG capsule Take 1 capsule (100 mg total) by mouth every 8 (eight) hours. 08/10/17  Yes Noemi Chapel, MD  budesonide-formoterol [facility]) 160-4.5 MCG/ACT inhaler Inhale 2 puffs into the lungs 2 (two) times daily as needed (shortness of breath).    Yes [provider]  carvedilol (COREG) 12.5 MG tablet 12.5 mg 2 (two) times daily with a meal.  07/08/17  Yes [provider]  Cholecalciferol (VITAMIN D-3) 5000 UNITS TABS Take 1 tablet by mouth daily.   Yes [provider]  diltiazem (TIAZAC) 360 MG 24 hr capsule  07/08/17  Yes [provider]  dimethicone 1 % cream Apply 1 application topically 2 (two) times daily as needed for dry skin.   Yes [provider]  fluticasone (FLONASE) 50 MCG/ACT nasal spray Place 2 sprays into both nostrils daily. 08/10/17  Yes Noemi Chapel, MD  Fluticasone-Salmeterol (ADVAIR) 250-50 MCG/DOSE AEPB Inhale 1 puff into the lungs daily.  08/13/17  Yes [provider]  furosemide (LASIX) 20 MG tablet Take 20 mg by mouth daily.  09/26/17  Yes [provider]  gabapentin (NEURONTIN) 300 MG capsule 300 mg 3 (three) times daily.  07/08/17  Yes [provider]  hydrocortisone 2.5 % cream Apply 1 application topically 2 (two) times daily.  06/24/17  Yes [provider]  hydrOXYzine (ATARAX/VISTARIL) 25 MG  tablet Take 25 mg by mouth every 4 (four) hours as needed for itching.  08/13/17  Yes [provider]  levothyroxine (SYNTHROID, LEVOTHROID) 125 MCG tablet Take 125 mcg by mouth daily before breakfast.   Yes [provider]  MOVANTIK 25 MG TABS tablet TAKE 1 TABLET BY MOUTH ONCE DAILY. TAKE 1 HOUR BEFORE OR TWO HOURS AFTER MEAL 07/08/17  Yes Mahala Menghini, PA-C  oxybutynin (DITROPAN) 5 MG tablet TAKE (1) TABLET BY MOUTH THREE TIMES DAILY 04/26/17  Yes Florian Buff, MD  oxyCODONE-acetaminophen (PERCOCET/ROXICET) 5-325 MG tablet 1 tablet every 6 (six) hours as needed for moderate pain or severe pain.  06/09/15  Yes [provider]  polyethylene glycol powder (GLYCOLAX/MIRALAX) powder 17 grams orally daily for constipation 03/13/17  Yes Mahala Menghini, PA-C  potassium chloride SA (K-DUR,KLOR-CON) 20 MEQ tablet  07/08/17  Yes [provider]  pravastatin (PRAVACHOL) 40 MG tablet Take 40 mg by mouth daily.   Yes [provider]  ranitidine (ZANTAC) 150 MG tablet Take 150 mg by mouth at bedtime.  12/08/14  Yes [provider]  sertraline (ZOLOFT) 50 MG tablet  07/08/17  Yes [provider]  SPIRIVA HANDIHALER 18 MCG inhalation capsule  08/19/17  Yes [provider]  traZODone (DESYREL) 150 MG tablet  10/01/17  Yes [provider]  triamterene-hydrochlorothiazide (MAXZIDE-25) 37.5-25 MG per tablet Take 1 tablet by mouth daily.   Yes [provider]    Review of Systems:  Limited secondary to patient's lethargy Physical Exam: Vitals:   10/03/17 1430 10/03/17 1445 10/03/17 1500 10/03/17 1515  BP: (!) 128/51 (!) 123/54 (!) 117/59 (!) 120/57  Pulse: 63 66 (!) 58   Resp: _0 SpO2: 94% 94% 94%   Weight:      Height:       General:  A&O x 2, NAD, nontoxic, pleasant/cooperative Head/Eye: No conjunctival hemorrhage, no icterus, [place]/AT, No nystagmus ENT:  No icterus,  No thrush, good dentition, no pharyngeal exudate Neck:  No masses, no lymphadenpathy, no bruits CV:  RRR, no rub, no gallop, no S3 Lung: Bibasilar rales.  Bibasilar expiratory wheeze.  Good air movement. Abdomen: soft/NT, +BS, nondistended, no peritoneal signs Ext: No cyanosis, No rashes, No petechiae, No lymphangitis, No edema Neuro: CNII-XII intact, strength 4-/5 in bilateral upper and lower extremities, no dysmetria  Labs on Admission:  Basic Metabolic Panel: Recent Labs  Lab 10/03/17 1102  NA 140  K 4.4  CL 105  CO2 27  GLUCOSE 88  BUN 27*  CREATININE 1.37*  CALCIUM 9.2   Liver Function Tests: Recent Labs  Lab 10/03/17 1102  AST 15  ALT 13  ALKPHOS 77  BILITOT 0.5  PROT 6.6  ALBUMIN 3.2*   No results for input(s): LIPASE, AMYLASE in the last 168 hours. No results for input(s): AMMONIA in the last 168 hours. CBC: Recent Labs  Lab 10/03/17 1102  WBC 6.8  NEUTROABS 4.7   HGB 13.7  HCT 38.4  MCV 93.9  PLT 154   Coagulation Profile: Recent Labs  Lab 10/03/17 1102  INR 0.96   Cardiac Enzymes: No results for input(s): CKTOTAL, CKMB, CKMBINDEX, TROPONINI in the last 168 hours. BNP: Invalid input(s): POCBNP CBG: Recent Labs  Lab 10/03/17 1106  GLUCAP 86   Urine analysis:    Component Value Date/Time   COLORURINE YELLOW 01/03/2016 [place] 01/03/2016 1032   LABSPEC 1.010 01/03/2016 1032   PHURINE 5.5 01/03/2016  [place] 01/03/2016 [place] 01/03/2016 [place] 01/03/2016 [place] 01/03/2016 1032   PROTEINUR NEGATIVE 01/03/2016 1032   UROBILINOGEN 0.2 09/19/2014 0105   NITRITE POSITIVE (A) 01/03/2016 1032   LEUKOCYTESUR NEGATIVE 01/03/2016 1032   Sepsis Labs: _0 (procalcitonin:4,lacticidven:4) )No results found for this or any previous visit (from the past 240 hour(s)).   Radiological Exams on Admission: Dg Chest 2 View  Result Date: 10/03/2017 CLINICAL DATA:  Weakness EXAM: CHEST - 2 VIEW COMPARISON:  08/10/2017 FINDINGS: There is shallow lung inflation. There is mild cardiomegaly. There is no focal airspace consolidation or pulmonary edema. There is no pleural effusion or pneumothorax. IMPRESSION: No active cardiopulmonary disease. Electronically Signed   By: Ulyses Jarred M.D.   On: 10/03/2017 13:42   Mr Brain Wo Contrast (neuro Protocol)  Result Date: 10/03/2017 CLINICAL DATA:  72 year old female with right side weakness and slurred speech. EXAM: MRI HEAD WITHOUT CONTRAST TECHNIQUE: Multiplanar, multiecho pulse sequences of the brain and surrounding structures were obtained without intravenous contrast. COMPARISON:  Head CT without contrast 1107 hours today. Brain MRI and Cervical spine MRI 01/03/2016. FINDINGS: Brain: No restricted diffusion to suggest acute infarction. No midline shift, mass effect, evidence of mass lesion, ventriculomegaly,  extra-axial collection or acute intracranial hemorrhage. Cervicomedullary junction and pituitary are within normal limits. Scattered small foci of cerebral white matter T2 and FLAIR hyperintensity appears stable since 2017 and are generally mild for age. There is involvement of the left cauda thalamic groove which is chronic. Similar chronic lacunar infarct of the left lentiform. No cortical encephalomalacia. The brainstem and cerebellum remain normal. Vascular: Major intracranial vascular flow voids are stable since 2017. Skull and upper cervical spine: Progressed degenerative appearing ligamentous hypertrophy about the odontoid since 2017. Otherwise stable visible cervical spine. Visualized bone marrow signal is within normal limits. Chronic calvarium hyperostosis, normal variant. Sinuses/Orbits: Stable and negative. Other: The right mastoid air cell scratched at the right mastoid effusion has partially regressed since 2017. Other Visible internal auditory structures appear normal. Scalp and face soft tissues appear negative. Generalized adenoid hypertrophy has occurred since the 2017 MRI as seen on series 8, image 11. Underlying clivus bone marrow signal remains normal. No discrete nasopharyngeal mass is identified. IMPRESSION: 1. Nonspecific adenoid hypertrophy since 2017. No discrete nasopharyngeal mass is evident. Query URI. 2. No acute intracranial abnormality and stable noncontrast MRI appearance of the brain since 2017. Mild for age chronic small vessel disease. Electronically Signed   By: Genevie Ann M.D.   On: 10/03/2017 13:23   Ct Head Code Stroke Wo Contrast  Result Date: 10/03/2017 CLINICAL DATA:  Code stroke. Motor neuron disease. Right-sided weakness slurred speech EXAM: CT HEAD WITHOUT CONTRAST TECHNIQUE: Contiguous axial images were obtained from the base of the skull through the vertex without intravenous contrast. COMPARISON:  MRI and CT 01/03/2016 FINDINGS: Brain: No evidence of acute  infarction, hemorrhage, hydrocephalus, extra-axial collection or mass lesion/mass effect. Small cyst left basal ganglia unchanged. Mild chronic ischemia inferior to the left caudate unchanged. Vascular: Negative for hyperdense vessel Skull: Diffuse calvarial hyperostosis with a benign appearance. Sinuses/Orbits: Mild mucosal edema left ethmoid sinus otherwise clear. Bilateral cataract surgery Other: None ASPECTS ([place] Stroke Program Early CT Score) - Ganglionic level infarction (caudate, lentiform nuclei, internal capsule, insula, M1-M3 cortex): 7 - Supraganglionic infarction (M4-M6 cortex): 3 Total score (0-10 with 10 being normal): 10 IMPRESSION: 1. No acute intracranial abnormality. Mild chronic microvascular ischemia 2. ASPECTS  is 10 These results were called by telephone at the time of interpretation on 10/03/2017 at 11:24 am to Dr. Francine Graven , who verbally acknowledged these results. Electronically Signed   By: Franchot Gallo M.D.   On: 10/03/2017 11:25    EKG: Independently reviewed. Sinus, IVCD    Time spent:60 minutes Code Status:   FULL Family Communication:  Spouse updated at bedside Disposition Plan: expect 1-2 day hospitalization Consults called: neurology DVT Prophylaxis: [place] Lovenox  Orson Eva, DO  [facility] Pager [phone]  If 7PM-7AM, please contact night-coverage [email or web address] Password [facility] 10/03/2017, 3:31 PM

## 2017-10-03 NOTE — ED Notes (Signed)
Large diarrheal stool Pt cleaned and pads under for comfort

## 2017-10-03 NOTE — ED Notes (Signed)
To Rad 

## 2017-10-03 NOTE — ED Notes (Signed)
Dr Mike GipMcM in to continue assessment and eval

## 2017-10-03 NOTE — ED Notes (Signed)
Echo continues  Resp down to procure ABG, but unable to due to Echo in progress

## 2017-10-03 NOTE — ED Notes (Signed)
Echo finished Resp called for ABGs  Pt request food

## 2017-10-03 NOTE — ED Notes (Signed)
Pt reports that she has been weak and dizzy for the last while Also with her R side numbness  Per spouse she was not right this am

## 2017-10-03 NOTE — ED Notes (Signed)
Pt speaks of weakness, swelling to her legs   PCP Dr Iran OuchStrader  Lab to bedside to draw lactic acid

## 2017-10-03 NOTE — Consult Note (Signed)
Dawson A. Merlene Laughter, MD     [email or web address]          Connie Ponce is an 72 y.o. female.   ASSESSMENT/PLAN: 1.  Acute to subacute onset of dysarthria and gait instability: Examination is mostly unrevealing.  Imaging shows no acute findings.  The etiology could be due to medication effect in the lady with increasing age and increasing creatinine.  Additionally, this may be another case of post lesional recrudescence from prior chronic lacunar infarct.  Metabolic derangement is a possibility but no clear metabolic disturbance has been uncovered.  Patient should be maintained on aspirin.  Physical and occupational therapies are recommended.  I think we should reduce her psychotropic medications.  Some of been held now.  This can be restarted at lower doses.  The gabapentin can be twice daily dosing and the trazodone 50  instead of 150.    Patient is a 72 year old black female who apparently has not felt well over the last several days/couple of weeks or so.  The husband reports that she has had some gait instability.  This appears to gotten worse overnight with the patient be much more unsteady.  Additionally, she is was noted to have dysarthria.  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  She does not report loss of consciousness.  She has had some episodic headaches but this seems unchanged.  She does not clearly report dizziness, dysphagia or diplopia.  She continues to work.  She ambulates most sound without assistive devices despite her residual right-sided weakness.  Sometimes she uses a cane however.  She reports significant knee pain on the right.  She is status post total knee arthroplasty that appears sometimes knee pain causes her to stumble but she does not report falling.  The review of systems otherwise negative.   GENERAL:  This a very pleasant female who is in no acute distress.  She is somewhat drowsy.  HEENT:  Supple  without evidence of trauma  ABDOMEN: soft  EXTREMITIES: No edema; there is significant arthritic changes in the knees bilaterally especially right side.  She is status post total knee arthroplasty on the right.  There is moderate to severe pain to range of motion with the right knee.  BACK: This is normal.  SKIN: Normal by inspection.    MENTAL STATUS: Alert and oriented. Speech mildly dysarthric - , language and cognition are generally intact. Judgment and insight normal.   CRANIAL NERVES: Pupils are equal, round and reactive to light and accomodation; extra ocular movements are full, there is no significant nystagmus; visual fields are full; upper and lower facial muscles are normal in strength and symmetric, there is no flattening of the nasolabial folds; tongue is midline; uvula is midline; shoulder elevation is normal.  MOTOR: Right upper extremity graded as 4+/5; right hip flexion 4- and dorsiflexion 4.  Left side is normal.  Bulk and tone are normal throughout.  COORDINATION: Left finger to nose is normal, right finger to nose is normal, No rest tremor; no intention tremor; no postural tremor; no bradykinesia.  REFLEXES: Deep tendon reflexes are symmetrical and normal in the arms but diminished at the left knee and the trace at the right.  Additionally, the right ankle jerk is markedly reduced possibly due to pain.  The left is normal.  SENSATION: Normal to light touch, temperature, and pain.          PRIOR NEURO NOTE 2017 1 .  Likely post-lesional recrudescence from chronic lacunar infarct in the setting of low-grade UTI, acute renal failure and dehydration. No evidence of acute infarct. Physical examination does not support radiculopathy. 2. Chronic left putaminal infarct. This is likely a chronic lacunar infarct. Risk factors hypertension, age and dyslipidemia. The patient aspirin will be increased to 162 mg daily. She currently is nothing by mouth and therefore will give her  aspirin rectally for now.    The patient is an 72 year old black female who woke up yesterday with numbness and weakness of the right upper extremity. The chart indicates that she had similar symptoms of the right leg but in questioning the patient she denies any numbness of the right leg. Her primary symptom seems to be numbness of the right upper extremity. She does report some heaviness. Again the legs are not involved. She has had some issues with pain and numbness of the legs but both legs are involving these occur chronically and episodically. She has had some neck pain but does not seems related to the right upper extremity symptoms. She denies chest pain, headaches or shortness of breath. She does not report dysarthria and dysphagia. The patient reports that she did have a mini stroke in the past with facial weakness and right upper extremity weakness. The primary area of concern however was facial weakness. She recovered from this. The review systems is otherwise negative.    Blood pressure 133/62, pulse 67, temperature 98.3 F (36.8 C), temperature source Oral, resp. rate 16, height _0  (1.651 m), weight 197 lb (89.4 kg), SpO2 94 %.  Past Medical History:  Diagnosis Date  . Anxiety   . Carpal tunnel syndrome   . COPD (chronic obstructive pulmonary disease) ([place])   . Gastric ulcer   . GERD (gastroesophageal reflux disease)   . Hyperglycemia   . Hyperlipidemia   . Hypertension   . Hypothyroidism   . Osteoarthritis   . Peptic ulcer disease   . Pneumonia 2010  . Renal insufficiency   . 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 Stroke ([place]) 1990s   mild  . TIA (transient ischemic attack)     Past Surgical History:  Procedure Laterality Date  . ABDOMINAL HYSTERECTOMY    . APPENDECTOMY    . BACK SURGERY    . BONE MARROW ASPIRATION Left 04/22/14  . BONE MARROW BIOPSY Left 04/22/14  . CHOLECYSTECTOMY    . COLONOSCOPY     ?2005, [place]  . COLONOSCOPY N/A 04/07/2015    FFM:[id number]  . COLONOSCOPY WITH ESOPHAGOGASTRODUODENOSCOPY (EGD)  02/11/2012   RMR: Ulcerative/erosive reflux esophagitis, benign appearing gastric ulcer with unremarkable biopsy, no H pylori. Colonoscopy was unremarkable. Next colonoscopy in 2023  . CYSTECTOMY     cyst removed from rectal area.   . ESOPHAGOGASTRODUODENOSCOPY  01/05/2003   RMR: Normal esophagus, small hiatal hernia/ A couple of tiny antral erosions, otherwise normal stomach normal D1 and D2.   . ESOPHAGOGASTRODUODENOSCOPY N/A 08/11/2014   LDJ:TTSVXBL ulcer/HH  . ESOPHAGOGASTRODUODENOSCOPY N/A 12/01/2014   TJQ:[id number] improved gastric ulcerations/p bx  . ESOPHAGOGASTRODUODENOSCOPY N/A 01/31/2016   Procedure: ESOPHAGOGASTRODUODENOSCOPY (EGD);  Surgeon: Daneil Dolin, MD;  [facility];  Service: Endoscopy;  Laterality: N/A;  745  . HEMORRHOID SURGERY    . JOINT REPLACEMENT Right    knee  . MALONEY DILATION N/A 01/31/2016   Procedure: Venia Minks DILATION;  Surgeon: Daneil Dolin, MD;  Location: [facility];  Service: Endoscopy;  Laterality: N/A;    Family  History  Problem Relation Age of Onset  . Other Mother        died age 52, natural causes  . Stroke Mother   . Diabetes Daughter   . Healthy Son   . Healthy Daughter   . Healthy Daughter   . Colon cancer Neg Hx   . Liver disease Neg Hx     Social History:  reports that she has been smoking cigarettes.  She has been smoking about 0.25 packs per day. She has never used smokeless tobacco. She reports that she does not drink alcohol or use drugs.  Allergies:  Allergies  Allergen Reactions  . Tetracyclines & Related   . Shellfish-Derived Products Nausea And Vomiting    Medications: Prior to Admission medications   Medication Sig Start Date End Date Taking? Authorizing Provider  albuterol (PROVENTIL HFA;VENTOLIN HFA) 108 (90 Base) MCG/ACT inhaler Inhale 2 puffs into the lungs every 4 (four) hours as needed for wheezing or shortness of breath.  08/10/17  Yes Noemi Chapel, MD  aspirin EC 81 MG EC tablet Take 2 tablets (162 mg total) by mouth daily. 01/05/16  Yes Thurnell Lose, MD  benzonatate (TESSALON) 100 MG capsule Take 1 capsule (100 mg total) by mouth every 8 (eight) hours. 08/10/17  Yes Noemi Chapel, MD  budesonide-formoterol [facility]) 160-4.5 MCG/ACT inhaler Inhale 2 puffs into the lungs 2 (two) times daily as needed (shortness of breath).    Yes [provider]  carvedilol (COREG) 12.5 MG tablet 12.5 mg 2 (two) times daily with a meal.  07/08/17  Yes [provider]  Cholecalciferol (VITAMIN D-3) 5000 UNITS TABS Take 1 tablet by mouth daily.   Yes [provider]  diltiazem (TIAZAC) 360 MG 24 hr capsule  07/08/17  Yes [provider]  dimethicone 1 % cream Apply 1 application topically 2 (two) times daily as needed for dry skin.   Yes [provider]  fluticasone (FLONASE) 50 MCG/ACT nasal spray Place 2 sprays into both nostrils daily. 08/10/17  Yes Noemi Chapel, MD  Fluticasone-Salmeterol (ADVAIR) 250-50 MCG/DOSE AEPB Inhale 1 puff into the lungs daily.  08/13/17  Yes [provider]  furosemide (LASIX) 20 MG tablet Take 20 mg by mouth daily.  09/26/17  Yes [provider]  gabapentin (NEURONTIN) 300 MG capsule 300 mg 3 (three) times daily.  07/08/17  Yes [provider]  hydrocortisone 2.5 % cream Apply 1 application topically 2 (two) times daily.  06/24/17  Yes [provider]  hydrOXYzine (ATARAX/VISTARIL) 25 MG tablet Take 25 mg by mouth every 4 (four) hours as needed for itching.  08/13/17  Yes [provider]  levothyroxine (SYNTHROID, LEVOTHROID) 125 MCG tablet Take 125 mcg by mouth daily before breakfast.   Yes [provider]  MOVANTIK 25 MG TABS tablet TAKE 1 TABLET BY MOUTH ONCE DAILY. TAKE 1 HOUR BEFORE OR TWO HOURS AFTER MEAL 07/08/17  Yes Mahala Menghini, PA-C  oxybutynin (DITROPAN) 5 MG tablet TAKE (1) TABLET BY MOUTH THREE  TIMES DAILY 04/26/17  Yes Florian Buff, MD  oxyCODONE-acetaminophen (PERCOCET/ROXICET) 5-325 MG tablet 1 tablet every 6 (six) hours as needed for moderate pain or severe pain.  06/09/15  Yes [provider]  polyethylene glycol powder (GLYCOLAX/MIRALAX) powder 17 grams orally daily for constipation 03/13/17  Yes Mahala Menghini, PA-C  potassium chloride SA (K-DUR,KLOR-CON) 20 MEQ tablet  07/08/17  Yes [provider]  pravastatin (PRAVACHOL) 40 MG tablet Take 40 mg by mouth daily.  Yes [provider]  ranitidine (ZANTAC) 150 MG tablet Take 150 mg by mouth at bedtime.  12/08/14  Yes [provider]  sertraline (ZOLOFT) 50 MG tablet  07/08/17  Yes [provider]  SPIRIVA HANDIHALER 18 MCG inhalation capsule  08/19/17  Yes [provider]  traZODone (DESYREL) 150 MG tablet  10/01/17  Yes [provider]  triamterene-hydrochlorothiazide (MAXZIDE-25) 37.5-25 MG per tablet Take 1 tablet by mouth daily.   Yes [provider]    Scheduled Meds: . [START ON 10/04/2017] aspirin EC  162 mg Oral Daily  . enoxaparin (LOVENOX) injection  40 mg Subcutaneous Q24H  . furosemide  20 mg Oral Daily  . ipratropium-albuterol  3 mL Nebulization Q6H  . [START ON 10/04/2017] levothyroxine  125 mcg Oral QAC breakfast  . mometasone-formoterol  2 puff Inhalation BID  . pravastatin  40 mg Oral Daily  . sertraline  50 mg Oral Daily   Continuous Infusions: . sodium chloride 10 mL/hr at 10/03/17 1745   PRN Meds:.acetaminophen **OR** acetaminophen (TYLENOL) oral liquid 160 mg/5 mL **OR** acetaminophen, hydrALAZINE, senna-docusate     Results for orders placed or performed during the hospital encounter of 10/03/17 (from the past 48 hour(s))  Protime-INR     Status: None   Collection Time: 10/03/17 11:02 AM  Result Value Ref Range   Prothrombin Time 12.7 11.4 - 15.2 seconds   INR 0.96     Comment: Performed at [facility], [address],  [place], [place] [address]  APTT     Status: None   Collection Time: 10/03/17 11:02 AM  Result Value Ref Range   aPTT 27 24 - 36 seconds    Comment: Performed at [facility], [address] [place] [address], [place], [place] [address]  CBC     Status: None   Collection Time: 10/03/17 11:02 AM  Result Value Ref Range   WBC 6.8 4.0 - 10.5 K/uL   RBC 4.09 3.87 - 5.11 MIL/uL   Hemoglobin 13.7 12.0 - 15.0 g/dL   HCT 38.4 36.0 - 46.0 %   MCV 93.9 78.0 - 100.0 fL   MCH 33.5 26.0 - 34.0 pg   MCHC 35.7 30.0 - 36.0 g/dL   RDW 12.7 11.5 - 15.5 %   Platelets 154 150 - 400 K/uL    Comment: Performed at [facility], [address] [place], [place] [address]  Differential     Status: None   Collection Time: 10/03/17 11:02 AM  Result Value Ref Range   Neutrophils Relative % 69 %   Neutro Abs 4.7 1.7 - 7.7 K/uL   Lymphocytes Relative 14 %   Lymphs Abs 1.0 0.7 - 4.0 K/uL   Monocytes Relative 10 %   Monocytes Absolute 0.7 0.1 - 1.0 K/uL   Eosinophils Relative 7 %   Eosinophils Absolute 0.5 0.0 - 0.7 K/uL   Basophils Relative 0 %   Basophils Absolute 0.0 0.0 - 0.1 K/uL    Comment: Performed at [facility], [address], [place], [place] [address]  Comprehensive metabolic panel     Status: Abnormal   Collection Time: 10/03/17 11:02 AM  Result Value Ref Range   Sodium 140 135 - 145 mmol/L   Potassium 4.4 3.5 - 5.1 mmol/L   Chloride 105 98 - 111 mmol/L    Comment: Please note change in reference range.   CO2 27 22 - 32 mmol/L   Glucose, Bld 88 70 - 99 mg/dL    Comment: Please note  change in reference range.   BUN 27 (H) 8 - 23 mg/dL    Comment: Please note change in reference range.   Creatinine, Ser 1.37 (H) 0.44 - 1.00 mg/dL   Calcium 9.2 8.9 - 10.3 mg/dL   Total Protein 6.6 6.5 - 8.1 g/dL   Albumin 3.2 (L) 3.5 - 5.0 g/dL   AST 15 15 - 41 U/L   ALT 13 0 - 44 U/L    Comment: Please note change in reference range.   Alkaline Phosphatase 77 38 - 126 U/L   Total Bilirubin 0.5 0.3 - 1.2 mg/dL   GFR calc non  Af Amer 37 (L) >60 mL/min   GFR calc Af Amer 43 (L) >60 mL/min    Comment: (NOTE) The eGFR has been calculated using the CKD EPI equation. This calculation has not been validated in all clinical situations. eGFR's persistently <60 mL/min signify possible Chronic Kidney Disease.    Anion gap 8 5 - 15    Comment: Performed at [facility], [address], [place], [place] [address]  Ethanol     Status: None   Collection Time: 10/03/17 11:04 AM  Result Value Ref Range   Alcohol, Ethyl (B) <10 <10 mg/dL    Comment: (NOTE) Lowest detectable limit for serum alcohol is 10 mg/dL. For medical purposes only. Performed at [facility], [address], [place], [place] [address]   CBG monitoring, ED     Status: None   Collection Time: 10/03/17 11:06 AM  Result Value Ref Range   Glucose-Capillary 86 70 - 99 mg/dL   Comment 1 Notify RN   I-stat troponin, ED (not at [facility], [facility])     Status: None   Collection Time: 10/03/17 11:07 AM  Result Value Ref Range   Troponin i, poc 0.00 0.00 - 0.08 ng/mL   Comment 3            Comment: Due to the release kinetics of cTnI, a negative result within the first hours of the onset of symptoms does not rule out myocardial infarction with certainty. If myocardial infarction is still suspected, repeat the test at appropriate intervals.   Lactic acid, plasma     Status: None   Collection Time: 10/03/17 12:00 PM  Result Value Ref Range   Lactic Acid, Venous 0.7 0.5 - 1.9 mmol/L    Comment: Performed at [facility], [address] [place] [address]  Lactic acid, plasma     Status: None   Collection Time: 10/03/17  2:00 PM  Result Value Ref Range   Lactic Acid, Venous 0.7 0.5 - 1.9 mmol/L    Comment: Performed at [facility], [address], [place],  [address]  Urine rapid drug screen (hosp performed)not at [facility]     Status: Abnormal   Collection Time: 10/03/17  2:42 PM  Result Value Ref Range   Opiates POSITIVE (A) NONE DETECTED   Cocaine  NONE DETECTED NONE DETECTED   Benzodiazepines NONE DETECTED NONE DETECTED   Amphetamines NONE DETECTED NONE DETECTED   Tetrahydrocannabinol NONE DETECTED NONE DETECTED   Barbiturates (A) NONE DETECTED    Result not available. Reagent lot number recalled by manufacturer.    Comment: (NOTE) DRUG SCREEN FOR MEDICAL PURPOSES ONLY.  IF CONFIRMATION IS NEEDED FOR ANY PURPOSE, NOTIFY LAB WITHIN 5 DAYS. LOWEST DETECTABLE LIMITS FOR URINE DRUG SCREEN Drug Class  Cutoff (ng/mL) Amphetamine and metabolites    1000 Barbiturate and metabolites    200 Benzodiazepine                 793 Tricyclics and metabolites     300 Opiates and metabolites        300 Cocaine and metabolites        300 THC                            50 Performed at [facility], [address] [place], [place] [address]   Urinalysis, Routine w reflex microscopic     Status: Abnormal   Collection Time: 10/03/17  2:42 PM  Result Value Ref Range   Color, Urine YELLOW YELLOW   APPearance CLEAR CLEAR   Specific Gravity, Urine 1.016 1.005 - 1.030   pH 5.0 5.0 - 8.0   Glucose, UA NEGATIVE NEGATIVE mg/dL   Hgb urine dipstick NEGATIVE NEGATIVE   Bilirubin Urine NEGATIVE NEGATIVE   Ketones, ur NEGATIVE NEGATIVE mg/dL   Protein, ur NEGATIVE NEGATIVE mg/dL   Nitrite POSITIVE (A) NEGATIVE   Leukocytes, UA NEGATIVE NEGATIVE   RBC / HPF 0-5 0 - 5 RBC/hpf   WBC, UA 0-5 0 - 5 WBC/hpf   Bacteria, UA RARE (A) NONE SEEN   Squamous Epithelial / LPF 0-5 0 - 5   Mucus PRESENT     Comment: Performed at [facility], [address], [place], [place] [address]  TSH     Status: None   Collection Time: 10/03/17  3:25 PM  Result Value Ref Range   TSH 1.653 0.350 - 4.500 uIU/mL    Comment: Performed by a 3rd Generation assay with a functional sensitivity of <=0.01 uIU/mL. Performed at [facility], [address], [place],  [address]     Studies/Results:   BRAIN MRI FINDINGS: Brain: No restricted diffusion to  suggest acute infarction. No midline shift, mass effect, evidence of mass lesion, ventriculomegaly, extra-axial collection or acute intracranial hemorrhage. Cervicomedullary junction and pituitary are within normal limits.  Scattered small foci of cerebral white matter T2 and FLAIR hyperintensity appears stable since 2017 and are generally mild for age. There is involvement of the left cauda thalamic groove which is chronic. Similar chronic lacunar infarct of the left lentiform. No cortical encephalomalacia. The brainstem and cerebellum remain normal.  Vascular: Major intracranial vascular flow voids are stable since 2017.  Skull and upper cervical spine: Progressed degenerative appearing ligamentous hypertrophy about the odontoid since 2017. Otherwise stable visible cervical spine. Visualized bone marrow signal is within normal limits. Chronic calvarium hyperostosis, normal variant.  Sinuses/Orbits: Stable and negative.  Other: The right mastoid air cell scratched at the right mastoid effusion has partially regressed since 2017. Other Visible internal auditory structures appear normal. Scalp and face soft tissues appear negative.  Generalized adenoid hypertrophy has occurred since the 2017 MRI as seen on series 8, image 11. Underlying clivus bone marrow signal remains normal. No discrete nasopharyngeal mass is identified.  IMPRESSION: 1. Nonspecific adenoid hypertrophy since 2017. No discrete nasopharyngeal mass is evident. Query URI. 2. No acute intracranial abnormality and stable noncontrast MRI appearance of the brain since 2017. Mild for age chronic small vessel disease.      The brain MRI scan is reviewed in person.  No acute changes are noted on DWI.  No hemorrhages seen on SWI.  FLAIR imaging shows mild to moderate deep white matter increase signal  consistent with microvascular changes.  There is a moderate-sized lucency somewhat linear fashion involving the  left putamen seen on about 5 cuts suspicious for lacunar infarct.    Teofila Bowery A. Merlene Laughter, M.D.  Diplomate, Tax adviser of Psychiatry and Neurology ( Neurology). 10/03/2017, 6:10 PM

## 2017-10-03 NOTE — ED Notes (Signed)
Call to floor for report  [place] to Misty [place]isa, [place]

## 2017-10-03 NOTE — ED Provider Notes (Signed)
[facility] EMERGENCY DEPARTMENT Provider Note   CSN: [id number] Arrival date & time: 10/03/17  1051   An emergency department physician performed an initial assessment on this suspected stroke patient at 1055.  History   Chief Complaint Chief Complaint  Patient presents with  . Code Stroke    HPI Miliyah Luper is a 72 y.o. female.  The history is provided by the patient and the spouse. The history is limited by the condition of the patient (acuity of condition).  Pt was seen at 1055.  Per pt and her family: c/o slurred speech, right sided weakness and "dizziness" that began approximately 0500 PTA. LKW last night before going to bed. Family also states pt was not acting her normal self when she woke up this morning.  Family states pt has hx of chronic right sided weakness/numbness. Denies CP/SOB, no abd pain, no N/V/D, no fevers.   Past Medical History:  Diagnosis Date  . Anxiety   . Carpal tunnel syndrome   . COPD (chronic obstructive pulmonary disease) ([place])   . Gastric ulcer   . GERD (gastroesophageal reflux disease)   . Hyperglycemia   . Hyperlipidemia   . Hypertension   . Hypothyroidism   . Osteoarthritis   . Peptic ulcer disease   . Pneumonia 2010  . Renal insufficiency   . 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 Stroke ([place]) 1990s   mild  . TIA (transient ischemic attack)     Patient Active Problem List   Diagnosis Date Noted  . Acute metabolic encephalopathy 40/98/1191  . Gait instability 10/03/2017  . Dysarthria 10/03/2017  . Essential hypertension 10/03/2017  . Slurred speech   . Abdominal mass, RUQ (right upper quadrant) 03/13/2017  . Abdominal pain 03/13/2017  . RUQ pain 03/13/2017  . Dysphagia   . TIA (transient ischemic attack) 01/03/2016  . Absolute anemia 07/03/2015  . Proctalgia   . Rectal pain 01/04/2015  . Hemorrhoids 01/04/2015  . Diarrhea 01/04/2015  . Gastric ulceration   . Gastric ulcer 11/29/2014  . Toxic metabolic encephalopathy  47/82/9562  . UTI (lower urinary tract infection) 09/19/2014  . ARF (acute renal failure) ([place])   . LUQ pain 07/28/2014  . Nausea with vomiting 07/28/2014  . Bilateral lower extremity edema 07/28/2014  . Thrombocytopenia ([place]) 04/12/2014  . RLQ abdominal pain 01/22/2012  . Constipation 01/22/2012  . GERD (gastroesophageal reflux disease) 01/22/2012    Past Surgical History:  Procedure Laterality Date  . ABDOMINAL HYSTERECTOMY    . APPENDECTOMY    . BACK SURGERY    . BONE MARROW ASPIRATION Left 04/22/14  . BONE MARROW BIOPSY Left 04/22/14  . CHOLECYSTECTOMY    . COLONOSCOPY     ?2005, [place]  . COLONOSCOPY N/A 04/07/2015   [id number]  . COLONOSCOPY WITH ESOPHAGOGASTRODUODENOSCOPY (EGD)  02/11/2012   RMR: Ulcerative/erosive reflux esophagitis, benign appearing gastric ulcer with unremarkable biopsy, no H pylori. Colonoscopy was unremarkable. Next colonoscopy in 2023  . CYSTECTOMY     cyst removed from rectal area.   . ESOPHAGOGASTRODUODENOSCOPY  01/05/2003   RMR: Normal esophagus, small hiatal hernia/ A couple of tiny antral erosions, otherwise normal stomach normal D1 and D2.   . ESOPHAGOGASTRODUODENOSCOPY N/A 08/11/2014   ONG:[id number] ulcer/HH  . ESOPHAGOGASTRODUODENOSCOPY N/A 12/01/2014   LKG:[id number] improved gastric ulcerations/p bx  . ESOPHAGOGASTRODUODENOSCOPY N/A 01/31/2016   Procedure: ESOPHAGOGASTRODUODENOSCOPY (EGD);  Surgeon: Daneil Dolin, MD;  Location: [facility] ENDO SUITE;  Service: Endoscopy;  Laterality: N/A;  745  . HEMORRHOID  SURGERY    . JOINT REPLACEMENT Right    knee  . MALONEY DILATION N/A 01/31/2016   Procedure: Venia Minks DILATION;  Surgeon: Daneil Dolin, MD;  Location: [facility] ENDO SUITE;  Service: Endoscopy;  Laterality: N/A;     OB History    Gravida  4   Para      Term      Preterm      AB      Living  3     SAB      TAB      Ectopic      Multiple      Live Births               Home Medications    Prior to Admission  medications   Medication Sig Start Date End Date Taking? Authorizing Provider  albuterol (PROVENTIL HFA;VENTOLIN HFA) 108 (90 Base) MCG/ACT inhaler Inhale 2 puffs into the lungs every 4 (four) hours as needed for wheezing or shortness of breath. 08/10/17  Yes Noemi Chapel, MD  aspirin EC 81 MG EC tablet Take 2 tablets (162 mg total) by mouth daily. 01/05/16  Yes Thurnell Lose, MD  benzonatate (TESSALON) 100 MG capsule Take 1 capsule (100 mg total) by mouth every 8 (eight) hours. 08/10/17  Yes Noemi Chapel, MD  budesonide-formoterol [facility]) 160-4.5 MCG/ACT inhaler Inhale 2 puffs into the lungs 2 (two) times daily as needed (shortness of breath).    Yes [provider]  carvedilol (COREG) 12.5 MG tablet 12.5 mg 2 (two) times daily with a meal.  07/08/17  Yes [provider]  Cholecalciferol (VITAMIN D-3) 5000 UNITS TABS Take 1 tablet by mouth daily.   Yes [provider]  diltiazem (TIAZAC) 360 MG 24 hr capsule  07/08/17  Yes [provider]  dimethicone 1 % cream Apply 1 application topically 2 (two) times daily as needed for dry skin.   Yes [provider]  fluticasone (FLONASE) 50 MCG/ACT nasal spray Place 2 sprays into both nostrils daily. 08/10/17  Yes Noemi Chapel, MD  Fluticasone-Salmeterol (ADVAIR) 250-50 MCG/DOSE AEPB Inhale 1 puff into the lungs daily.  08/13/17  Yes [provider]  furosemide (LASIX) 20 MG tablet Take 20 mg by mouth daily.  09/26/17  Yes [provider]  gabapentin (NEURONTIN) 300 MG capsule 300 mg 3 (three) times daily.  07/08/17  Yes [provider]  hydrocortisone 2.5 % cream Apply 1 application topically 2 (two) times daily.  06/24/17  Yes [provider]  hydrOXYzine (ATARAX/VISTARIL) 25 MG tablet Take 25 mg by mouth every 4 (four) hours as needed for itching.  08/13/17  Yes [provider]  levothyroxine (SYNTHROID, LEVOTHROID) 125 MCG tablet Take 125 mcg by mouth daily before  breakfast.   Yes [provider]  MOVANTIK 25 MG TABS tablet TAKE 1 TABLET BY MOUTH ONCE DAILY. TAKE 1 HOUR BEFORE OR TWO HOURS AFTER MEAL 07/08/17  Yes Mahala Menghini, PA-C  oxybutynin (DITROPAN) 5 MG tablet TAKE (1) TABLET BY MOUTH THREE TIMES DAILY 04/26/17  Yes Florian Buff, MD  oxyCODONE-acetaminophen (PERCOCET/ROXICET) 5-325 MG tablet 1 tablet every 6 (six) hours as needed for moderate pain or severe pain.  06/09/15  Yes [provider]  polyethylene glycol powder (GLYCOLAX/MIRALAX) powder 17 grams orally daily for constipation 03/13/17  Yes Mahala Menghini, PA-C  potassium chloride SA (K-DUR,KLOR-CON) 20 MEQ tablet  07/08/17  Yes [provider]  pravastatin (PRAVACHOL) 40 MG tablet Take 40  mg by mouth daily.   Yes [provider]  ranitidine (ZANTAC) 150 MG tablet Take 150 mg by mouth at bedtime.  12/08/14  Yes [provider]  sertraline (ZOLOFT) 50 MG tablet  07/08/17  Yes [provider]  SPIRIVA HANDIHALER 18 MCG inhalation capsule  08/19/17  Yes [provider]  traZODone (DESYREL) 150 MG tablet  10/01/17  Yes [provider]  triamterene-hydrochlorothiazide (MAXZIDE-25) 37.5-25 MG per tablet Take 1 tablet by mouth daily.   Yes [provider]    Family History Family History  Problem Relation Age of Onset  . Other Mother        died age 27, natural causes  . Stroke Mother   . Diabetes Daughter   . Healthy Son   . Healthy Daughter   . Healthy Daughter   . Colon cancer Neg Hx   . Liver disease Neg Hx     Social History Social History   Tobacco Use  . Smoking status: Current Every Day Smoker    Packs/day: 0.25    Types: Cigarettes  . Smokeless tobacco: Never Used  Substance Use Topics  . Alcohol use: No    Alcohol/week: 0.0 oz  . Drug use: No     Allergies   Tetracyclines & related and Shellfish-derived products   Review of Systems Review of Systems  Unable to perform ROS: Acuity of  condition     Physical Exam Updated Vital Signs BP (!) 125/56   Pulse (!) 58   Resp (!) 21   Ht '5\' 5"'$  (1.651 m)   Wt 89.4 kg (197 lb)   SpO2 94%   BMI 32.78 kg/m    Patient Vitals for the past 24 hrs:  BP Pulse Resp SpO2 Height Weight  10/03/17 1600 (!) 125/56 - (!) 21 - - -  10/03/17 1545 (!) 122/53 - 18 - - -  10/03/17 1530 (!) 125/54 - 17 - - -  10/03/17 1515 (!) 120/57 - 15 - - -  10/03/17 1500 (!) 117/59 (!) 58 15 94 % - -  10/03/17 1445 (!) 123/54 66 11 94 % - -  10/03/17 1430 (!) 128/51 63 18 94 % - -  10/03/17 1422 - 65 (!) 22 93 % - -  10/03/17 1418 (!) 110/49 63 - 92 % - -  10/03/17 1230 (!) 116/59 66 19 91 % - -  10/03/17 1215 (!) 113/58 67 18 92 % - -  10/03/17 1148 - 75 19 100 % - -  10/03/17 1144 - - (!) 28 - - -  10/03/17 1140 - - 10 - - -  10/03/17 1135 - - 15 - - -  10/03/17 1130 119/63 - 19 - - -  10/03/17 1125 - - 13 - - -  10/03/17 1121 115/62 81 20 95 % - -  10/03/17 1121 115/62 80 (!) 21 95 % - -  10/03/17 1115 - 77 18 92 % - -  10/03/17 1100 - - - - '5\' 5"'$  (1.651 m) 89.4 kg (197 lb)     Physical Exam 1055: Physical examination:  Nursing notes reviewed; Vital signs and O2 SAT reviewed;  Constitutional: Well developed, Well nourished, Well hydrated, In no acute distress; Head:  Normocephalic, atraumatic; Eyes: EOMI, PERRL, No scleral icterus; ENMT: Mouth and pharynx normal, Mucous membranes moist; Neck: Supple, Full range of motion, No lymphadenopathy; Cardiovascular: Regular rate and rhythm, No gallop; Respiratory: Breath sounds clear & equal bilaterally, No wheezes.  Normal  respiratory effort/excursion; Chest: Nontender, Movement normal; Abdomen: Soft, Nontender, Nondistended, Normal bowel sounds; Genitourinary: No CVA tenderness; Extremities: Peripheral pulses normal, No tenderness, No edema, No calf edema or asymmetry.; Neuro: AA&Ox3, poor historian. No facial droop. Major CN grossly intact.  Speech slurred. Grips equal. +decreased sensation RUE and  RLE. Strength 5/5 equal bilat UE's and LE's. No gross focal motor deficits in extremities.; Skin: Color normal, Warm, Dry.   ED Treatments / Results  Labs (all labs ordered are listed, but only abnormal results are displayed)   EKG EKG Interpretation  Date/Time:  Friday October 03 2017 11:04:26 EDT Ventricular Rate:  79 PR Interval:    QRS Duration: 123 QT Interval:  389 QTC Calculation: 446 R Axis:   59 Text Interpretation:  Sinus rhythm Nonspecific intraventricular conduction delay When compared with ECG of 08/10/2017 No significant change was found Confirmed by Francine Graven [phone]) on 10/03/2017 2:01:39 PM   Radiology   Procedures Procedures (including critical care time)  Medications Ordered in ED Medications  aspirin EC tablet 162 mg (has no administration in time range)  furosemide (LASIX) tablet 20 mg (has no administration in time range)  levothyroxine (SYNTHROID, LEVOTHROID) tablet 125 mcg (has no administration in time range)  pravastatin (PRAVACHOL) tablet 40 mg (has no administration in time range)  sertraline (ZOLOFT) tablet 50 mg (has no administration in time range)   stroke: mapping our early stages of recovery book (has no administration in time range)  0.9 %  sodium chloride infusion (has no administration in time range)  acetaminophen (TYLENOL) tablet 650 mg (has no administration in time range)    Or  acetaminophen (TYLENOL) solution 650 mg (has no administration in time range)    Or  acetaminophen (TYLENOL) suppository 650 mg (has no administration in time range)  senna-docusate (Senokot-S) tablet 1 tablet (has no administration in time range)  enoxaparin (LOVENOX) injection 40 mg (has no administration in time range)     Initial Impression / Assessment and Plan / ED Course  I have reviewed the triage vital signs and the nursing notes.  Pertinent labs & imaging results that were available during my care of the patient were reviewed by me and  considered in my medical decision making (see chart for details).  MDM Reviewed: previous chart, nursing note and vitals Reviewed previous: labs and ECG Interpretation: labs, ECG, x-ray, MRI and CT scan Total time providing critical care: 30-74 minutes. This excludes time spent performing separately reportable procedures and services. Consults: neurology and admitting MD   CRITICAL CARE Performed by: Alfonzo Feller Total critical care time: 40 minutes Critical care time was exclusive of separately billable procedures and treating other patients. Critical care was necessary to treat or prevent imminent or life-threatening deterioration. Critical care was time spent personally by me on the following activities: development of treatment plan with patient and/or surrogate as well as nursing, discussions with consultants, evaluation of patient's response to treatment, examination of patient, obtaining history from patient or surrogate, ordering and performing treatments and interventions, ordering and review of laboratory studies, ordering and review of radiographic studies, pulse oximetry and re-evaluation of patient's condition.   Results for orders placed or performed during the hospital encounter of 10/03/17  Ethanol  Result Value Ref Range   Alcohol, Ethyl (B) <10 <10 mg/dL  Protime-INR  Result Value Ref Range   Prothrombin Time 12.7 11.4 - 15.2 seconds   INR 0.96   APTT  Result Value Ref Range   aPTT 27 24 -  36 seconds  CBC  Result Value Ref Range   WBC 6.8 4.0 - 10.5 K/uL   RBC 4.09 3.87 - 5.11 MIL/uL   Hemoglobin 13.7 12.0 - 15.0 g/dL   HCT 38.4 36.0 - 46.0 %   MCV 93.9 78.0 - 100.0 fL   MCH 33.5 26.0 - 34.0 pg   MCHC 35.7 30.0 - 36.0 g/dL   RDW 12.7 11.5 - 15.5 %   Platelets 154 150 - 400 K/uL  Differential  Result Value Ref Range   Neutrophils Relative % 69 %   Neutro Abs 4.7 1.7 - 7.7 K/uL   Lymphocytes Relative 14 %   Lymphs Abs 1.0 0.7 - 4.0 K/uL   Monocytes  Relative 10 %   Monocytes Absolute 0.7 0.1 - 1.0 K/uL   Eosinophils Relative 7 %   Eosinophils Absolute 0.5 0.0 - 0.7 K/uL   Basophils Relative 0 %   Basophils Absolute 0.0 0.0 - 0.1 K/uL  Comprehensive metabolic panel  Result Value Ref Range   Sodium 140 135 - 145 mmol/L   Potassium 4.4 3.5 - 5.1 mmol/L   Chloride 105 98 - 111 mmol/L   CO2 27 22 - 32 mmol/L   Glucose, Bld 88 70 - 99 mg/dL   BUN 27 (H) 8 - 23 mg/dL   Creatinine, Ser 1.37 (H) 0.44 - 1.00 mg/dL   Calcium 9.2 8.9 - 10.3 mg/dL   Total Protein 6.6 6.5 - 8.1 g/dL   Albumin 3.2 (L) 3.5 - 5.0 g/dL   AST 15 15 - 41 U/L   ALT 13 0 - 44 U/L   Alkaline Phosphatase 77 38 - 126 U/L   Total Bilirubin 0.5 0.3 - 1.2 mg/dL   GFR calc non Af Amer 37 (L) >60 mL/min   GFR calc Af Amer 43 (L) >60 mL/min   Anion gap 8 5 - 15  Urine rapid drug screen (hosp performed)not at [facility]  Result Value Ref Range   Opiates POSITIVE (A) NONE DETECTED   Cocaine NONE DETECTED NONE DETECTED   Benzodiazepines NONE DETECTED NONE DETECTED   Amphetamines NONE DETECTED NONE DETECTED   Tetrahydrocannabinol NONE DETECTED NONE DETECTED   Barbiturates (A) NONE DETECTED    Result not available. Reagent lot number recalled by manufacturer.  Urinalysis, Routine w reflex microscopic  Result Value Ref Range   Color, Urine YELLOW YELLOW   APPearance CLEAR CLEAR   Specific Gravity, Urine 1.016 1.005 - 1.030   pH 5.0 5.0 - 8.0   Glucose, UA NEGATIVE NEGATIVE mg/dL   Hgb urine dipstick NEGATIVE NEGATIVE   Bilirubin Urine NEGATIVE NEGATIVE   Ketones, ur NEGATIVE NEGATIVE mg/dL   Protein, ur NEGATIVE NEGATIVE mg/dL   Nitrite POSITIVE (A) NEGATIVE   Leukocytes, UA NEGATIVE NEGATIVE   RBC / HPF 0-5 0 - 5 RBC/hpf   WBC, UA 0-5 0 - 5 WBC/hpf   Bacteria, UA RARE (A) NONE SEEN   Squamous Epithelial / LPF 0-5 0 - 5   Mucus PRESENT   Lactic acid, plasma  Result Value Ref Range   Lactic Acid, Venous 0.7 0.5 - 1.9 mmol/L  Lactic acid, plasma  Result Value Ref  Range   Lactic Acid, Venous 0.7 0.5 - 1.9 mmol/L  TSH  Result Value Ref Range   TSH 1.653 0.350 - 4.500 uIU/mL  I-stat troponin, ED (not at [facility], [facility])  Result Value Ref Range   Troponin i, poc 0.00 0.00 - 0.08 ng/mL   Comment 3  CBG monitoring, ED  Result Value Ref Range   Glucose-Capillary 86 70 - 99 mg/dL   Comment 1 Notify RN   ECHOCARDIOGRAM COMPLETE  Result Value Ref Range   Weight 3,152 oz   Height 65 in   BP 120/57 mmHg   Dg Chest 2 View Result Date: 10/03/2017 CLINICAL DATA:  Weakness EXAM: CHEST - 2 VIEW COMPARISON:  08/10/2017 FINDINGS: There is shallow lung inflation. There is mild cardiomegaly. There is no focal airspace consolidation or pulmonary edema. There is no pleural effusion or pneumothorax. IMPRESSION: No active cardiopulmonary disease. Electronically Signed   By: Ulyses Jarred M.D.   On: 10/03/2017 13:42   Mr Brain Wo Contrast (neuro Protocol) Result Date: 10/03/2017 CLINICAL DATA:  72 year old female with right side weakness and slurred speech. EXAM: MRI HEAD WITHOUT CONTRAST TECHNIQUE: Multiplanar, multiecho pulse sequences of the brain and surrounding structures were obtained without intravenous contrast. COMPARISON:  Head CT without contrast 1107 hours today. Brain MRI and Cervical spine MRI 01/03/2016. FINDINGS: Brain: No restricted diffusion to suggest acute infarction. No midline shift, mass effect, evidence of mass lesion, ventriculomegaly, extra-axial collection or acute intracranial hemorrhage. Cervicomedullary junction and pituitary are within normal limits. Scattered small foci of cerebral white matter T2 and FLAIR hyperintensity appears stable since 2017 and are generally mild for age. There is involvement of the left cauda thalamic groove which is chronic. Similar chronic lacunar infarct of the left lentiform. No cortical encephalomalacia. The brainstem and cerebellum remain normal. Vascular: Major intracranial vascular flow voids are stable since  2017. Skull and upper cervical spine: Progressed degenerative appearing ligamentous hypertrophy about the odontoid since 2017. Otherwise stable visible cervical spine. Visualized bone marrow signal is within normal limits. Chronic calvarium hyperostosis, normal variant. Sinuses/Orbits: Stable and negative. Other: The right mastoid air cell scratched at the right mastoid effusion has partially regressed since 2017. Other Visible internal auditory structures appear normal. Scalp and face soft tissues appear negative. Generalized adenoid hypertrophy has occurred since the 2017 MRI as seen on series 8, image 11. Underlying clivus bone marrow signal remains normal. No discrete nasopharyngeal mass is identified. IMPRESSION: 1. Nonspecific adenoid hypertrophy since 2017. No discrete nasopharyngeal mass is evident. Query URI. 2. No acute intracranial abnormality and stable noncontrast MRI appearance of the brain since 2017. Mild for age chronic small vessel disease. Electronically Signed   By: Genevie Ann M.D.   On: 10/03/2017 13:23   Ct Head Code Stroke Wo Contrast Result Date: 10/03/2017 CLINICAL DATA:  Code stroke. Motor neuron disease. Right-sided weakness slurred speech EXAM: CT HEAD WITHOUT CONTRAST TECHNIQUE: Contiguous axial images were obtained from the base of the skull through the vertex without intravenous contrast. COMPARISON:  MRI and CT 01/03/2016 FINDINGS: Brain: No evidence of acute infarction, hemorrhage, hydrocephalus, extra-axial collection or mass lesion/mass effect. Small cyst left basal ganglia unchanged. Mild chronic ischemia inferior to the left caudate unchanged. Vascular: Negative for hyperdense vessel Skull: Diffuse calvarial hyperostosis with a benign appearance. Sinuses/Orbits: Mild mucosal edema left ethmoid sinus otherwise clear. Bilateral cataract surgery Other: None ASPECTS ([place] Stroke Program Early CT Score) - Ganglionic level infarction (caudate, lentiform nuclei, internal capsule,  insula, M1-M3 cortex): 7 - Supraganglionic infarction (M4-M6 cortex): 3 Total score (0-10 with 10 being normal): 10 IMPRESSION: 1. No acute intracranial abnormality. Mild chronic microvascular ischemia 2. ASPECTS is 10 These results were called by telephone at the time of interpretation on 10/03/2017 at 11:24 am to Dr. Francine Graven , who verbally acknowledged these results. Electronically Signed  By: Franchot Gallo M.D.   On: 10/03/2017 11:25    1140:  Code Stroke called on pt's arrival. TeleNeuro Dr. Ramon Dredge has evaluated pt: pt's slurred speech was improving during evaluation, may be TIA, recommends metabolic workup and MRI brain.  1325:  +UTI, UC pending; IV rocephin given. Slurred speech improved. T/C returned from [facility] Dr. Carles Collet, case discussed, including:  HPI, pertinent PM/SHx, VS/PE, dx testing, ED course and treatment:  Agreeable to admit.      Final Clinical Impressions(s) / ED Diagnoses   Final diagnoses:  TIA (transient ischemic attack)  Slurred speech    ED Discharge Orders    None       Francine Graven, DO 10/05/17 1810

## 2017-10-03 NOTE — ED Notes (Signed)
Pt very poor historian- reports by pt and spouse that she was at work and work call to have spouse pick her up as she was not well  Then awakened at 0500 and was waek to right side with slurred speech  Then was slurred speech when going to bed last night  Pt lifted legs to stretcher and pulled off blouse to assist in getting into stretcher and putting on gown Then noted to scratch nose several times in CT with R hand to nose

## 2017-10-03 NOTE — Consult Note (Signed)
[facility] Tele[facility]urology Consult Services Date of service: 10/03/2017  Impression: 72 year old female who presents with slurred speech and right side weakness and dizziness. Unclear if symptoms are all new or if she has chronic right side weakness. New slurred speech may be due to metabolic/infectious encephalopathy vs acute stroke.    Not a tpa candidate due to: LSN more than 4.5 hours prior to arrival Not an NIR candidate due to symptoms inconsistent with LVO:  Comments:   [facility] contacted: 11:21 [facility] at bedside: 11:27 NIHSS assessment time: 11:30 Last seen normal: 21:00  Recommendations:  Start antiplatelet if no obvious contraindication Stroke protocol admission/ orderset suggested with placement on stroke floor tele monitoring Bedside swallow evaluation HOB less than 30 degrees IV Fluid hydration with NS Euglycemia avoid hyperthermia, PRN acetaminophen dvt ppx  inpatient neurology consultation Inpatient stroke evaluation as per Neurology/ Internal Medicine Discussed with ED MD  -----------------------------------------------------------------------------------------  CC: Stroke alert  History of Present Illness   Patient is a 72 year old female who presented to the hospital because of slurred speech. Patient was last seen normal last night when she went to bed. Her husband got home around 00:30 and noticed some slurred speech. This morning when the patient woke up the patient was not acting her normal self and so her husband brought her to the hospital. Patient was also complaining of right side weakness, numbness and dizziness however this has been going on for a prolonged period of time.     Diagnostic: CT brain w/o contrast: no acute hemorrhage or large territory infarct.   Exam: NIH Stroke Scale   Interval: Baseline Time: 11:31 AM Person Administering Scale: Joice LoftsGeetanjali Leven Hoel  Administer stroke scale items in the order listed.  Record performance in each category after each subscale exam. Do not go back and change scores. Follow directions provided for each exam technique. Scores should reflect what the patient does, not what the clinician thinks the patient can do. The clinician should record answers while administering the exam and work quickly. Except where indicated, the patient should not be coached (i.e., repeated requests to patient to make a special effort).   1a  Level of consciousness: 0=alert; keenly responsive  1b. LOC questions:  1=Performs one task correctly  1c. LOC commands: 0=Performs both tasks correctly  2.  Best Gaze: 0=normal  3.  Visual: 0=No visual loss  4. Facial Palsy: 0=Normal symmetric movement  5a.  Motor left arm: 0=No drift, limb holds 90 (or 45) degrees for full 10 seconds  5b.  Motor right arm: 0=No drift, limb holds 90 (or 45) degrees for full 10 seconds  6a. motor left leg: 0=No drift, limb holds 90 (or 45) degrees for full 10 seconds  6b  Motor right leg:  0=No drift, limb holds 90 (or 45) degrees for full 10 seconds  7. Limb Ataxia: 0=Absent  8.  Sensory: 1=Mild to moderate sensory loss; patient feels pinprick is less sharp or is dull on the affected side; there is a loss of superficial pain with pinprick but patient is aware She is being touched  9. Best Language:  0=No aphasia, normal  10. Dysarthria: 1=Mild to moderate, patient slurs at least some words and at worst, can be understood with some difficulty  11. Extinction and Inattention: 0=No abnormality  12. Distal motor function: 0=Normal   Total:   3    Medical Decision Making:  - Extensive number of diagnosis or management options are considered above.   -  Extensive amount of complex data reviewed.   - High risk of complication and/or morbidity or mortality are associated with differential diagnostic considerations above.  - There may be Uncertain outcome and increased probability of prolonged functional impairment or  high probability of severe prolonged functional impairment associated with some of these differential diagnosis.  Medical Data Reviewed:  1.Data reviewed include clinical labs, radiology,  Medical Tests;   2.Tests results discussed w/performing or interpreting physician;   3.Obtaining/reviewing old medical records;  4.Obtaining case history from another source;  5.Independent review of image, tracing or specimen.    Patient was informed the Neurology Consult would happen via telehealth (remote video) and consented to receiving care in this manner.

## 2017-10-03 NOTE — ED Notes (Signed)
From MRI, to Rad

## 2017-10-03 NOTE — ED Notes (Signed)
Spoke w Dr Mike GipMcM re: stroke swallow and asked if should repeat  Dr Mike GipMcM states was done earlier and ordered x 1

## 2017-10-03 NOTE — ED Notes (Signed)
Echo in to assess

## 2017-10-03 NOTE — ED Notes (Signed)
Pt placed on purewick 

## 2017-10-03 NOTE — ED Notes (Signed)
Per Lequita HaltMorgan, RN room is being cleaned

## 2017-10-03 NOTE — ED Notes (Signed)
teleneuro Janene MadeiraLensay, RN

## 2017-10-03 NOTE — Progress Notes (Signed)
Patient currently having ECHO done at 1550. Patient is on room air, no respiratory distress noted, CO2 levels per lab are WNL, and last documented SpO2 was  94% at 1500 . Per RN ABG can be obtained when patient is transferred to her room on the floor.

## 2017-10-03 NOTE — Progress Notes (Signed)
*  PRELIMINARY RESULTS* Echocardiogram 2D Echocardiogram has been performed.  Stacey DrainWhite, Sherrie Marsan J 10/03/2017, 4:18 PM

## 2017-10-03 NOTE — ED Notes (Signed)
From MRI/Rad

## 2017-10-03 NOTE — ED Notes (Signed)
Pt family given room number and assured that pt will be moved as soon as possible

## 2017-10-03 NOTE — ED Notes (Signed)
Assisted to be placed upon bed pain for BM

## 2017-10-03 NOTE — ED Triage Notes (Signed)
Pt reports sudden onset of dizziness, RT sided weakness, and slurred speech that began around 0500. 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 Pt on blood thinners, but unsure of name.

## 2017-10-03 NOTE — ED Notes (Signed)
Dr Tat has been contacted  Awaiting urine results

## 2017-10-03 NOTE — ED Notes (Signed)
Resp to room and reports repeat Echo in progress

## 2017-10-04 DIAGNOSIS — I10 Essential (primary) hypertension: Secondary | ICD-10-CM

## 2017-10-04 DIAGNOSIS — G459 Transient cerebral ischemic attack, unspecified: Secondary | ICD-10-CM | POA: Diagnosis not present

## 2017-10-04 DIAGNOSIS — G9341 Metabolic encephalopathy: Secondary | ICD-10-CM

## 2017-10-04 DIAGNOSIS — R2681 Unsteadiness on feet: Secondary | ICD-10-CM

## 2017-10-04 LAB — RPR: RPR Ser Ql: NONREACTIVE

## 2017-10-04 LAB — HEMOGLOBIN A1C
Hgb A1c MFr Bld: 6 % — ABNORMAL HIGH (ref 4.8–5.6)
Mean Plasma Glucose: 125.5 mg/dL

## 2017-10-04 LAB — LIPID PANEL
CHOL/HDL RATIO: 4.2 ratio
CHOLESTEROL: 126 mg/dL (ref 0–200)
HDL: 30 mg/dL — AB (ref >40)
LDL Cholesterol: 66 mg/dL (ref 0–99)
Triglycerides: 150 mg/dL — ABNORMAL HIGH (ref <150)
VLDL: 30 mg/dL (ref 0–40)

## 2017-10-04 MED ORDER — KETOROLAC TROMETHAMINE 15 MG/ML IJ SOLN
7.5000 mg | Freq: Once | INTRAMUSCULAR | Status: AC
Start: 2017-10-04 — End: 2017-10-04
  Administered 2017-10-04: 7.5 mg via INTRAVENOUS
  Filled 2017-10-04: qty 1

## 2017-10-04 NOTE — Discharge Summary (Addendum)
Physician Discharge Summary  Connie BlakeDarlean Ponce [id number] DOB: February 15, 1946 DOA: 10/03/2017  PCP: Connie Ponce  Admit date: 10/03/2017 Discharge date: 10/04/2017  Time spent: >35 minutes  Recommendations for Outpatient Follow-up:  PCP in 3-7 days  Discharge Diagnoses:  Active Problems:   Acute metabolic encephalopathy   Gait instability   Dysarthria   Essential hypertension   Discharge Condition: stable   Diet recommendation: low sodium   Filed Weights   10/03/17 1100  Weight: 89.4 kg (197 lb)    History of present illness:    72 y.o. female with medical history of hypertension, chronic back pain, hypothyroidism, COPD, TIA presenting with slurred speech and somnolence.  Unfortunately, the patient is a poor historian.  Most of the history is obtained from speaking with the patient's spouse at the bedside and reviewed the medical record.  Apparently, the patient has had some dizziness and gait instability for several weeks off and on.  Her husband who works second shift called her on the evening of 10/02/2017.  He noted that her speech was a bit slurred at that time.  Nevertheless, she went to bed for the rest of the night.  When the patient woke up around 5 AM on 10/03/2017, her husband noted the patient to have slurred speech, and he stated that " she was not herself."  Nevertheless, the patient went to work.  Apparently 1 of her coworkers noted the patient have slurred speech and contacted the patient's spouse to come pick her up.  Her husband subsequently brought her to the emergency department.  He states that her slurred speech has improved since going to the emergency department, but she remains somnolent.  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  The patient continues to smoke about 1/2 pack/day.  She states that she takes approximately 2 Percocet daily for pain but denies overuse.  She denies any chest pain, shortness breath,  fevers, chills, nausea or vomiting, diarrhea, abdominal pain. In the emergency department, the patient was afebrile hemodynamically stable saturating 95% on room air.  BMP, CBC, and LFTs were unremarkable.  CT of the brain was negative.  MRI of the brain was negative for acute findings.  Chest x-ray was negative.  The patient was admitted for further work up.    Hospital Course:    Acute metabolic encephalopathy. Resolved. Likely partly due to polypharmacy. Patient reports taking chronically oxycodone, gabapentin for chronic pain. Recommended to decrease dosing, cont f/u with PCP to wean from opioid as possible   Gait instability/slurred speech. Resolved. MRI brain--neg. MRA brain--neg. Carotid Duplex-: no hemodynamically significant stenosis. Cont antiplatelet, risk modification and f/u with PCP as outpatient   Essential hypertension. Resume carvedilol. Discontinued cardizem due to dial AV block with BB.  BP I stable. Cont f/u with PCP in 1 week to titrate med as needed   CKD stage III Baseline creatinine 1.0-1.3  COPD. Stable on room air. Cont home bronchodilators   Hypothyroidism. Continue Synthroid  Hyperlipidemia. continue statin     Procedures:  Echo  (i.e. Studies not automatically included, echos, thoracentesis, etc; not x-rays)  Consultations:  Neurology   Discharge Exam: Vitals:   10/04/17 0812 10/04/17 0843  BP:  (!) 132/96  Pulse:  76  Resp:  20  Temp:  99.1 F (37.3 C)  SpO2: 93% 95%    General: no distress  Cardiovascular: s1,s2 rrr Respiratory: CTA BL  Discharge Instructions  Discharge Instructions    Diet -  low sodium heart healthy   Complete by:  As directed    Increase activity slowly   Complete by:  As directed      Allergies as of 10/04/2017      Reactions   Tetracyclines & Related    Shellfish-derived Products Nausea And Vomiting      Medication List    STOP taking these medications   benzonatate 100 MG capsule Commonly  known as:  TESSALON   diltiazem 360 MG 24 hr capsule Commonly known as:  TIAZAC   dimethicone 1 % cream   fluticasone 50 MCG/ACT nasal spray Commonly known as:  FLONASE   Fluticasone-Salmeterol 250-50 MCG/DOSE Aepb Commonly known as:  ADVAIR   hydrocortisone 2.5 % cream   triamterene-hydrochlorothiazide 37.5-25 MG tablet Commonly known as:  MAXZIDE-25     TAKE these medications   albuterol 108 (90 Base) MCG/ACT inhaler Commonly known as:  PROVENTIL HFA;VENTOLIN HFA Inhale 2 puffs into the lungs every 4 (four) hours as needed for wheezing or shortness of breath.   aspirin 81 MG EC tablet Take 2 tablets (162 mg total) by mouth daily.   budesonide-formoterol 160-4.5 MCG/ACT inhaler Commonly known as:  SYMBICORT Inhale 2 puffs into the lungs 2 (two) times daily as needed (shortness of breath).   carvedilol 12.5 MG tablet Commonly known as:  COREG 12.5 mg 2 (two) times daily with a meal.   furosemide 20 MG tablet Commonly known as:  LASIX Take 20 mg by mouth daily.   gabapentin 300 MG capsule Commonly known as:  NEURONTIN 300 mg 3 (three) times daily.   hydrOXYzine 25 MG tablet Commonly known as:  ATARAX/VISTARIL Take 25 mg by mouth every 4 (four) hours as needed for itching.   levothyroxine 125 MCG tablet Commonly known as:  SYNTHROID, LEVOTHROID Take 125 mcg by mouth daily before breakfast.   MOVANTIK 25 MG Tabs tablet Generic drug:  naloxegol oxalate TAKE 1 TABLET BY MOUTH ONCE DAILY. TAKE 1 HOUR BEFORE OR TWO HOURS AFTER MEAL   oxybutynin 5 MG tablet Commonly known as:  DITROPAN TAKE (1) TABLET BY MOUTH THREE TIMES DAILY   oxyCODONE-acetaminophen 5-325 MG tablet Commonly known as:  PERCOCET/ROXICET 1 tablet every 6 (six) hours as needed for moderate pain or severe pain.   polyethylene glycol powder powder Commonly known as:  GLYCOLAX/MIRALAX 17 grams orally daily for constipation   potassium chloride SA 20 MEQ tablet Commonly known as:   K-DUR,KLOR-CON   pravastatin 40 MG tablet Commonly known as:  PRAVACHOL Take 40 mg by mouth daily.   ranitidine 150 MG tablet Commonly known as:  ZANTAC Take 150 mg by mouth at bedtime.   sertraline 50 MG tablet Commonly known as:  ZOLOFT   SPIRIVA HANDIHALER 18 MCG inhalation capsule Generic drug:  tiotropium   traZODone 150 MG tablet Commonly known as:  DESYREL   Vitamin D-3 5000 units Tabs Take 1 tablet by mouth daily.      Allergies  Allergen Reactions  . Tetracyclines & Related   . Shellfish-Derived Products Nausea And Vomiting      The results of significant diagnostics from this hospitalization (including imaging, microbiology, ancillary and laboratory) are listed below for reference.    Significant Diagnostic Studies: Dg Chest 2 View  Result Date: 10/03/2017 CLINICAL DATA:  Weakness EXAM: CHEST - 2 VIEW COMPARISON:  08/10/2017 FINDINGS: There is shallow lung inflation. There is mild cardiomegaly. There is no focal airspace consolidation or pulmonary edema. There is no pleural effusion or pneumothorax.  IMPRESSION: No active cardiopulmonary disease. Electronically Signed   By: Deatra Robinson M.D.   On: 10/03/2017 13:42   Mr Brain Wo Contrast (neuro Protocol)  Result Date: 10/03/2017 CLINICAL DATA:  72 year old female with right side weakness and slurred speech. EXAM: MRI HEAD WITHOUT CONTRAST TECHNIQUE: Multiplanar, multiecho pulse sequences of the brain and surrounding structures were obtained without intravenous contrast. COMPARISON:  Head CT without contrast 1107 hours today. Brain MRI and Cervical spine MRI 01/03/2016. FINDINGS: Brain: No restricted diffusion to suggest acute infarction. No midline shift, mass effect, evidence of mass lesion, ventriculomegaly, extra-axial collection or acute intracranial hemorrhage. Cervicomedullary junction and pituitary are within normal limits. Scattered small foci of cerebral white matter T2 and FLAIR hyperintensity appears  stable since 2017 and are generally mild for age. There is involvement of the left cauda thalamic groove which is chronic. Similar chronic lacunar infarct of the left lentiform. No cortical encephalomalacia. The brainstem and cerebellum remain normal. Vascular: Major intracranial vascular flow voids are stable since 2017. Skull and upper cervical spine: Progressed degenerative appearing ligamentous hypertrophy about the odontoid since 2017. Otherwise stable visible cervical spine. Visualized bone marrow signal is within normal limits. Chronic calvarium hyperostosis, normal variant. Sinuses/Orbits: Stable and negative. Other: The right mastoid air cell scratched at the right mastoid effusion has partially regressed since 2017. Other Visible internal auditory structures appear normal. Scalp and face soft tissues appear negative. Generalized adenoid hypertrophy has occurred since the 2017 MRI as seen on series 8, image 11. Underlying clivus bone marrow signal remains normal. No discrete nasopharyngeal mass is identified. IMPRESSION: 1. Nonspecific adenoid hypertrophy since 2017. No discrete nasopharyngeal mass is evident. Query URI. 2. No acute intracranial abnormality and stable noncontrast MRI appearance of the brain since 2017. Mild for age chronic small vessel disease. Electronically Signed   By: Odessa Fleming M.D.   On: 10/03/2017 13:23   US Carotid Bilateral (at [facility] And [facility] Only)  Result Date: 10/03/2017 CLINICAL DATA:  Right sided weakness and numbness. History of hypertension and hyperlipidemia. Former smoker. EXAM: BILATERAL CAROTID DUPLEX ULTRASOUND TECHNIQUE: Wallace Cullens scale imaging, color Doppler and duplex ultrasound were performed of bilateral carotid and vertebral arteries in the neck. COMPARISON:  Carotid Doppler ultrasound-01/04/2016 FINDINGS: Criteria: Quantification of carotid stenosis is based on velocity parameters that correlate the residual internal carotid diameter with NASCET-based stenosis levels,  using the diameter of the distal internal carotid lumen as the denominator for stenosis measurement. The following velocity measurements were obtained: RIGHT ICA:  51/14 cm/sec CCA:  57/10 cm/sec SYSTOLIC ICA/CCA RATIO:  0.9 DIASTOLIC ICA/CCA RATIO:  1.4 ECA:  179 cm/sec LEFT ICA:  76/24 cm/sec CCA:  99/12 cm/sec SYSTOLIC ICA/CCA RATIO:  0.8 DIASTOLIC ICA/CCA RATIO:  2.0 ECA:  103 cm/sec RIGHT CAROTID ARTERY: There is a minimal amount of echogenic plaque within the distal aspect of the right common carotid artery (image 11). There is a minimal amount of echogenic plaque within the right carotid bulb (image 16), extending to involve the origin and proximal aspects of the right internal carotid artery (image 24), not resulting in elevated peak systolic velocities within the interrogated course of the right internal carotid artery to suggest a hemodynamically significant stenosis RIGHT VERTEBRAL ARTERY:  Antegrade flow LEFT CAROTID ARTERY: There is no grayscale evidence of significant intimal thickening or atherosclerotic plaque affecting the interrogated portions of the left carotid system. There are no elevated peak systolic velocities within the interrogated course the left internal carotid artery to suggest a hemodynamically significant stenosis.  LEFT VERTEBRAL ARTERY:  Antegrade flow IMPRESSION: 1. Minimal amount of right-sided atherosclerotic plaque, likely progressed compared to the 12/2015 examination, though again not resulting in a hemodynamically significant stenosis. 2. Unremarkable sonographic appearance of the left carotid system. Electronically Signed   By: Simonne Come M.D.   On: 10/03/2017 16:41   Ct Head Code Stroke Wo Contrast  Result Date: 10/03/2017 CLINICAL DATA:  Code stroke. Motor neuron disease. Right-sided weakness slurred speech EXAM: CT HEAD WITHOUT CONTRAST TECHNIQUE: Contiguous axial images were obtained from the base of the skull through the vertex without intravenous contrast.  COMPARISON:  MRI and CT 01/03/2016 FINDINGS: Brain: No evidence of acute infarction, hemorrhage, hydrocephalus, extra-axial collection or mass lesion/mass effect. Small cyst left basal ganglia unchanged. Mild chronic ischemia inferior to the left caudate unchanged. Vascular: Negative for hyperdense vessel Skull: Diffuse calvarial hyperostosis with a benign appearance. Sinuses/Orbits: Mild mucosal edema left ethmoid sinus otherwise clear. Bilateral cataract surgery Other: None ASPECTS (Alberta Stroke Program Early CT Score) - Ganglionic level infarction (caudate, lentiform nuclei, internal capsule, insula, M1-M3 cortex): 7 - Supraganglionic infarction (M4-M6 cortex): 3 Total score (0-10 with 10 being normal): 10 IMPRESSION: 1. No acute intracranial abnormality. Mild chronic microvascular ischemia 2. ASPECTS is 10 These results were called by telephone at the time of interpretation on 10/03/2017 at 11:24 am to Dr. Samuel Jester , who verbally acknowledged these results. Electronically Signed   By: Marlan Palau M.D.   On: 10/03/2017 11:25    Microbiology: No results found for this or any previous visit (from the past 240 hour(s)).   Labs: Basic Metabolic Panel: Recent Labs  Lab 10/03/17 1102  NA 140  K 4.4  CL 105  CO2 27  GLUCOSE 88  BUN 27*  CREATININE 1.37*  CALCIUM 9.2   Liver Function Tests: Recent Labs  Lab 10/03/17 1102  AST 15  ALT 13  ALKPHOS 77  BILITOT 0.5  PROT 6.6  ALBUMIN 3.2*   No results for input(s): LIPASE, AMYLASE in the last 168 hours. No results for input(s): AMMONIA in the last 168 hours. CBC: Recent Labs  Lab 10/03/17 1102  WBC 6.8  NEUTROABS 4.7  HGB 13.7  HCT 38.4  MCV 93.9  PLT 154   Cardiac Enzymes: No results for input(s): CKTOTAL, CKMB, CKMBINDEX, TROPONINI in the last 168 hours. BNP: BNP (last 3 results) Recent Labs    08/10/17 0928  BNP 100.0    ProBNP (last 3 results) No results for input(s): PROBNP in the last 8760  hours.  CBG: Recent Labs  Lab 10/03/17 1106  GLUCAP 86       Signed:  Mervin Ramires N  [facility] 10/04/2017, 9:36 AM   Addendum: called updated her with urine culture results. She reports feeling well. No fevers. although UA showed<5 wbc but growing >100k grn. D/w her regarding antibiotic treatment. I have called in antibiotic prescription to her pharmacy, recommended to f/u with PCP Fatemah Pourciau N

## 2017-10-04 NOTE — Progress Notes (Signed)
PT Cancellation Note  Patient Details Name: Connie Ponce MRN: [id number] DOB: June 13, 1945   Cancelled Treatment:    Reason Eval/Treat Not Completed: PT screened, no needs identified, will sign off. Pt only agreeable to PT screen stating that she does not need physical therapy and that she is ready to go home. Pt reporting that she was fully independent PTA, no AD, still working, driving, etc. Pt slightly off balance during 17500ft amb into hallway but she stated this was because she is weak from not eating the last day or two and feels that once she eats a good meal she will feel better. Otherwise, she reports feeling at her baseline level of function. No acute or f/u PT needs identified at this time.    Jac CanavanBrooke Julita Ozbun PT, DPT

## 2017-10-04 NOTE — Progress Notes (Signed)
Discharge instructions read to patient and family.  All verbalized understanding of instructions. Stroke information reviewed with family and patient.  Discharged to home with family

## 2017-10-06 LAB — URINE CULTURE: Culture: >=100000 — AB

## 2017-10-08 ENCOUNTER — Ambulatory Visit (INDEPENDENT_AMBULATORY_CARE_PROVIDER_SITE_OTHER): Payer: Medicare HMO | Admitting: Acute Care

## 2017-10-08 ENCOUNTER — Ambulatory Visit (INDEPENDENT_AMBULATORY_CARE_PROVIDER_SITE_OTHER)
Admission: RE | Admit: 2017-10-08 | Discharge: 2017-10-08 | Disposition: A | Discharge status: Home / Self Care | Payer: Medicare HMO | Source: Ambulatory Visit | Attending: Acute Care | Admitting: Acute Care

## 2017-10-08 ENCOUNTER — Encounter: Payer: Self-pay | Admitting: Acute Care

## 2017-10-08 DIAGNOSIS — Z122 Encounter for screening for malignant neoplasm of respiratory organs: Secondary | ICD-10-CM

## 2017-10-08 DIAGNOSIS — F17210 Nicotine dependence, cigarettes, uncomplicated: Secondary | ICD-10-CM

## 2017-10-08 NOTE — Progress Notes (Signed)
Shared Decision Making Visit Lung Cancer Screening Program [phone]([id number])   Eligibility:  Age 72 y.o.  Pack Years Smoking History Calculation 84 pack year smoking history (# packs/per year x # years smoked)  Recent History of coughing up blood  no  Unexplained weight loss? no ( >Than 15 pounds within the last 6 months )  Prior History Lung / other cancer no (Diagnosis within the last 5 years already requiring surveillance chest CT Scans).  Smoking Status Current Smoker  Former Smokers: Years since quit: NA  Quit Date: NA  Visit Components:  Discussion included one or more decision making aids. yes  Discussion included risk/benefits of screening. yes  Discussion included potential follow up diagnostic testing for abnormal scans. yes  Discussion included meaning and risk of over diagnosis. yes  Discussion included meaning and risk of False Positives. yes  Discussion included meaning of total radiation exposure. yes  Counseling Included:  Importance of adherence to annual lung cancer LDCT screening. yes  Impact of comorbidities on ability to participate in the program. yes  Ability and willingness to under diagnostic treatment. yes  Smoking Cessation Counseling:  Current Smokers:   Discussed importance of smoking cessation. yes  Information about tobacco cessation classes and interventions provided to patient. yes  Patient provided with "ticket" for LDCT Scan. yes  Symptomatic Patient. no  Counseling  Diagnosis Code: Tobacco Use Z72.0  Asymptomatic Patient yes  Counseling (Intermediate counseling: > three minutes counseling) [id number]  Former Smokers:   Discussed the importance of maintaining cigarette abstinence. yes  Diagnosis Code: Personal History of Nicotine Dependence. U98.119Z87.891  Information about tobacco cessation classes and interventions provided to patient. Yes  Patient provided with "ticket" for LDCT Scan. yes  Written Order for Lung Cancer  Screening with LDCT placed in Epic. Yes (CT Chest Lung Cancer Screening Low Dose W/O CM) [id number] Z12.2-Screening of respiratory organs Z87.891-Personal history of nicotine dependence  I have spent 25 minutes of face to face time with Ms. Chestine SporeClark discussing the risks and benefits of lung cancer screening. We viewed a power point together that explained in detail the above noted topics. We paused at intervals to allow for questions to be asked and answered to ensure understanding.We discussed that the single most powerful action that she can take to decrease her risk of developing lung cancer is to quit smoking. We discussed whether or not she is ready to commit to setting a quit date. We discussed options for tools to aid in quitting smoking including nicotine replacement therapy, non-nicotine medications, support groups, Quit Smart classes, and behavior modification. We discussed that often times setting smaller, more achievable goals, such as eliminating 1 cigarette a day for a week and then 2 cigarettes a day for a week can be helpful in slowly decreasing the number of cigarettes smoked. This allows for a sense of accomplishment as well as providing a clinical benefit. I gave her the " Be Stronger Than Your Excuses" card with contact information for community resources, classes, free nicotine replacement therapy, and access to mobile apps, text messaging, and on-line smoking cessation help. I have also given her my card and contact information in the event she needs to contact me. We discussed the time and location of the scan, and that either Abigail Miyamotoenise Phelps RN or I will call with the results within 24-48 hours of receiving them. I have offered her  a copy of the power point we viewed  as a resource in the event they need reinforcement of  the concepts we discussed today in the office. The patient verbalized understanding of all of  the above and had no further questions upon leaving the office. They have my  contact information in the event they have any further questions.  I spent 4 minutes counseling on smoking cessation and the health risks of continued tobacco abuse.  I explained to the patient that there has been a high incidence of coronary artery disease noted on these exams. I explained that this is a non-gated exam therefore degree or severity cannot be determined. This patient is on statin therapy. I have asked the patient to follow-up with their PCP regarding any incidental finding of coronary artery disease and management with diet or medication as their PCP  feels is clinically indicated. The patient verbalized understanding of the above and had no further questions upon completion of the visit.      Bevelyn Ngo, NP 10/08/2017 9:56 AM

## 2017-10-10 ENCOUNTER — Telehealth: Payer: Self-pay | Admitting: Acute Care

## 2017-10-13 ENCOUNTER — Telehealth: Payer: Self-pay | Admitting: Acute Care

## 2017-10-13 DIAGNOSIS — Z122 Encounter for screening for malignant neoplasm of respiratory organs: Secondary | ICD-10-CM

## 2017-10-13 DIAGNOSIS — F17210 Nicotine dependence, cigarettes, uncomplicated: Principal | ICD-10-CM

## 2017-10-14 NOTE — Telephone Encounter (Signed)
Pt informed of CT results per Sarah Groce, NP.  PT verbalized understanding.  Copy sent to PCP.  Order placed for 1 yr f/u CT.  

## 2017-11-11 ENCOUNTER — Telehealth: Payer: Self-pay | Admitting: Internal Medicine

## 2017-11-11 NOTE — Telephone Encounter (Signed)
The PCP was calling to see when the patient's next colonoscopy was due. Her last one was in 03/2015.  We have her on a recall for 2024, but that was entered back in 2014. Please advise so I can call the PCP back.

## 2017-11-13 ENCOUNTER — Telehealth: Payer: Self-pay | Admitting: Internal Medicine

## 2017-11-13 NOTE — Telephone Encounter (Signed)
The PCP was calling to see when the patient's next colonoscopy was due. Her last one was in 03/2015.  We have her on a recall for 2024, but that was entered back in 2014. Please advise so I can call the PCP back.   I sent this to RMR as well. Please advise.

## 2017-11-13 NOTE — Telephone Encounter (Signed)
Dr.Rourk, pts tcs in 2017 was normal, pt is on the recall for 2024 which is a 10 year follow up tcs from 2014. Pt will be 77 in 2024. I cannot find any other recommendations regarding tcs. Do you want her to have one at 6179?

## 2017-11-16 NOTE — Telephone Encounter (Signed)
Original recommendation was for routine screening 2024.  Would be 76 in 2024.  Also, had intervening evaluation in 2015 which included a TCS which was good. So, because of age and negative TCS 4 years ago, I do not recommend a future TCS unless new sx develop such as bleeding, etc.

## 2017-11-18 NOTE — Telephone Encounter (Signed)
Routing to Susan. 

## 2017-11-19 NOTE — Telephone Encounter (Signed)
PCP is aware  

## 2017-12-26 ENCOUNTER — Other Ambulatory Visit: Payer: Self-pay | Admitting: Gastroenterology

## 2017-12-29 ENCOUNTER — Telehealth: Payer: Self-pay | Admitting: Gastroenterology

## 2017-12-29 MED ORDER — NALOXEGOL OXALATE 12.5 MG PO TABS: 12.5000 mg | ORAL_TABLET | Freq: Every day | ORAL | 3 refills | Status: DC

## 2017-12-29 NOTE — Telephone Encounter (Signed)
Calculated creatinine clearance from several months ago was less than 60. We need to decrease Movantik dose to 12.5 mg daily. I cancelled the 25 mg dosing. I have sent in Movantik 12.5 mg and discussed personally with pharmacy to cancel the 25 mg.  Please let patient know we have decreased this dosing.

## 2017-12-29 NOTE — Telephone Encounter (Signed)
Lmom, waiting on a return call.  

## 2017-12-30 NOTE — Telephone Encounter (Signed)
Pt notified of Movantik decreasing to the 12.5 mg daily.

## 2018-01-14 ENCOUNTER — Other Ambulatory Visit (HOSPITAL_COMMUNITY): Payer: Self-pay | Admitting: Nurse Practitioner

## 2018-01-14 DIAGNOSIS — Z1231 Encounter for screening mammogram for malignant neoplasm of breast: Secondary | ICD-10-CM

## 2018-01-30 ENCOUNTER — Ambulatory Visit (HOSPITAL_COMMUNITY)
Admission: RE | Admit: 2018-01-30 | Discharge: 2018-01-30 | Disposition: A | Discharge status: Home / Self Care | Payer: Medicare HMO | Source: Ambulatory Visit | Attending: Nurse Practitioner | Admitting: Nurse Practitioner

## 2018-01-30 DIAGNOSIS — Z1231 Encounter for screening mammogram for malignant neoplasm of breast: Secondary | ICD-10-CM | POA: Diagnosis present

## 2018-02-05 ENCOUNTER — Other Ambulatory Visit (HOSPITAL_COMMUNITY): Payer: Self-pay | Admitting: Respiratory Therapy

## 2018-02-05 DIAGNOSIS — J441 Chronic obstructive pulmonary disease with (acute) exacerbation: Secondary | ICD-10-CM

## 2018-02-17 ENCOUNTER — Ambulatory Visit (INDEPENDENT_AMBULATORY_CARE_PROVIDER_SITE_OTHER): Payer: Medicare HMO

## 2018-02-17 ENCOUNTER — Encounter: Payer: Self-pay | Admitting: Orthopaedic Surgery

## 2018-02-17 ENCOUNTER — Ambulatory Visit (INDEPENDENT_AMBULATORY_CARE_PROVIDER_SITE_OTHER): Payer: Medicare HMO | Admitting: Orthopaedic Surgery

## 2018-02-17 VITALS — BP 139/63 | HR 72 | Ht 64.0 in | Wt 198.0 lb

## 2018-02-17 DIAGNOSIS — M25562 Pain in left knee: Secondary | ICD-10-CM

## 2018-02-17 NOTE — Progress Notes (Signed)
Subjective:    Patient ID: Connie Ponce, female    DOB: 10/24/1945, 72 y.o.   MRN: [id number]  HPI She has had pain in the left knee for over a month.  She has swelling, popping and giving way.  The giving way is the major problem. She falls.  She uses a cane to catch herself.  She has no trauma.  She has no redness. She is on Eliquis for blood clots and cannot take NSAIDs.  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   Review of Systems  Constitutional: Positive for activity change.  Respiratory: Positive for shortness of breath. Negative for cough.   Musculoskeletal: Positive for arthralgias, gait problem and joint swelling.  Psychiatric/Behavioral: The patient is nervous/anxious.   All other systems reviewed and are negative.  For Review of Systems, all other systems reviewed and are negative.  The following is a summary of the past history medically, past history surgically, known current medicines, social history and family history.  This information is gathered electronically by the computer from prior information and documentation.  I review this each visit and have found including this information at this point in the chart is beneficial and informative.   Past Medical History:  Diagnosis Date  . Anxiety   . Carpal tunnel syndrome   . COPD (chronic obstructive pulmonary disease) ([facility])   . Gastric ulcer   . GERD (gastroesophageal reflux disease)   . Hyperglycemia   . Hyperlipidemia   . Hypertension   . Hypothyroidism   . Osteoarthritis   . Peptic ulcer disease   . Pneumonia 2010  . Renal insufficiency   . 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 Stroke ([place]) 1990s   mild  . TIA (transient ischemic attack)     Past Surgical History:  Procedure Laterality Date  . ABDOMINAL HYSTERECTOMY    . APPENDECTOMY    . BACK SURGERY    . BONE MARROW ASPIRATION Left 04/22/14  . BONE MARROW BIOPSY Left 04/22/14  . CHOLECYSTECTOMY    . COLONOSCOPY     ?2005, [place]  .  COLONOSCOPY N/A 04/07/2015   OZY:YQMGNO  . COLONOSCOPY WITH ESOPHAGOGASTRODUODENOSCOPY (EGD)  02/11/2012   RMR: Ulcerative/erosive reflux esophagitis, benign appearing gastric ulcer with unremarkable biopsy, no H pylori. Colonoscopy was unremarkable. Next colonoscopy in 2023  . CYSTECTOMY     cyst removed from rectal area.   . ESOPHAGOGASTRODUODENOSCOPY  01/05/2003   RMR: Normal esophagus, small hiatal hernia/ A couple of tiny antral erosions, otherwise normal stomach normal D1 and D2.   . ESOPHAGOGASTRODUODENOSCOPY N/A 08/11/2014   IBB:CWUGQBV ulcer/HH  . ESOPHAGOGASTRODUODENOSCOPY N/A 12/01/2014   QXI:[id number] improved gastric ulcerations/p bx  . ESOPHAGOGASTRODUODENOSCOPY N/A 01/31/2016   Procedure: ESOPHAGOGASTRODUODENOSCOPY (EGD);  Surgeon: Daneil Dolin, MD;  Location: [facility];  Service: Endoscopy;  Laterality: N/A;  745  . HEMORRHOID SURGERY    . JOINT REPLACEMENT Right    knee  . MALONEY DILATION N/A 01/31/2016   Procedure: Venia Minks DILATION;  Surgeon: Daneil Dolin, MD;  Location: [facility];  Service: Endoscopy;  Laterality: N/A;    Current Outpatient Medications on File Prior to Visit  Medication Sig Dispense Refill  . albuterol (PROVENTIL HFA;VENTOLIN HFA) 108 (90 Base) MCG/ACT inhaler Inhale 2 puffs into the lungs every 4 (four) hours as needed for wheezing or shortness of breath. 1 Inhaler 3  . allopurinol (ZYLOPRIM) 100 MG tablet   1  . aspirin EC 81  MG EC tablet Take 2 tablets (162 mg total) by mouth daily. 60 tablet 0  . budesonide-formoterol (SYMBICORT) 160-4.5 MCG/ACT inhaler Inhale 2 puffs into the lungs 2 (two) times daily as needed (shortness of breath).     . carvedilol (COREG) 12.5 MG tablet 12.5 mg 2 (two) times daily with a meal.     . Cholecalciferol (VITAMIN D-3) 5000 UNITS TABS Take 1 tablet by mouth daily.    Marland Kitchen COLCRYS 0.6 MG tablet   1  . DEXILANT 60 MG capsule     . diltiazem (TIAZAC) 360 MG 24 hr capsule     . ELIQUIS 5 MG TABS tablet       . furosemide (LASIX) 20 MG tablet Take 20 mg by mouth daily.   2  . gabapentin (NEURONTIN) 300 MG capsule 300 mg 3 (three) times daily.     . hydrOXYzine (ATARAX/VISTARIL) 25 MG tablet Take 25 mg by mouth every 4 (four) hours as needed for itching.   0  . levothyroxine (SYNTHROID, LEVOTHROID) 125 MCG tablet Take 125 mcg by mouth daily before breakfast.    . montelukast (SINGULAIR) 10 MG tablet     . naloxegol oxalate (MOVANTIK) 12.5 MG TABS tablet Take 1 tablet (12.5 mg total) by mouth daily. 30 tablet 3  . oxybutynin (DITROPAN) 5 MG tablet TAKE (1) TABLET BY MOUTH THREE TIMES DAILY 90 tablet 11  . oxyCODONE-acetaminophen (PERCOCET/ROXICET) 5-325 MG tablet 1 tablet every 6 (six) hours as needed for moderate pain or severe pain.     . polyethylene glycol powder (GLYCOLAX/MIRALAX) powder 17 grams orally daily for constipation 527 g 3  . potassium chloride SA (K-DUR,KLOR-CON) 20 MEQ tablet     . pravastatin (PRAVACHOL) 40 MG tablet Take 40 mg by mouth daily.    . ranitidine (ZANTAC) 150 MG tablet Take 150 mg by mouth at bedtime.     . sertraline (ZOLOFT) 50 MG tablet     . SPIRIVA HANDIHALER 18 MCG inhalation capsule   3  . traZODone (DESYREL) 150 MG tablet   5  . triamterene-hydrochlorothiazide (MAXZIDE-25) 37.5-25 MG tablet      No current facility-administered medications on file prior to visit.     Social History   Socioeconomic History  . Marital status: Married    Spouse name: Not on file  . Number of children: 3  . Years of education: Not on file  . Highest education level: Not on file  Occupational History  . Not on file  Social Needs  . Financial resource strain: Not on file  . Food insecurity:    Worry: Not on file    Inability: Not on file  . Transportation needs:    Medical: Not on file    Non-medical: Not on file  Tobacco Use  . Smoking status: Current Every Day Smoker    Packs/day: 1.50    Years: 58.00    Pack years: 87.00    Types: Cigarettes  . Smokeless  tobacco: Never Used  Substance and Sexual Activity  . Alcohol use: No    Alcohol/week: 0.0 standard drinks  . Drug use: No  . Sexual activity: Never  Lifestyle  . Physical activity:    Days per week: Not on file    Minutes per session: Not on file  . Stress: Not on file  Relationships  . Social connections:    Talks on phone: Not on file    Gets together: Not on file    Attends religious service:  Not on file    Active member of club or organization: Not on file    Attends meetings of clubs or organizations: Not on file    Relationship status: Not on file  . Intimate partner violence:    Fear of current or ex partner: Not on file    Emotionally abused: Not on file    Physically abused: Not on file    Forced sexual activity: Not on file  Other Topics Concern  . Not on file  Social History Narrative  . Not on file    Family History  Problem Relation Age of Onset  . Other Mother        died age 75, natural causes  . Stroke Mother   . Diabetes Daughter   . Healthy Son   . Healthy Daughter   . Healthy Daughter   . Colon cancer Neg Hx   . Liver disease Neg Hx     BP 139/63   Pulse 72   Ht 5' 4"  (1.626 m)   Wt 198 lb (89.8 kg)   BMI 33.99 kg/m   Body mass index is 33.99 kg/m.     Objective:   Physical Exam  Constitutional: She is oriented to person, place, and time. She appears well-developed and well-nourished.  HENT:  Head: Normocephalic and atraumatic.  Eyes: Pupils are equal, round, and reactive to light. Conjunctivae and EOM are normal.  Neck: Normal range of motion. Neck supple.  Cardiovascular: Normal rate, regular rhythm and intact distal pulses.  Pulmonary/Chest: Effort normal.  Abdominal: Soft.  Musculoskeletal:       Left knee: She exhibits decreased range of motion, swelling and effusion. Tenderness found. Medial joint line tenderness noted.       Legs: Neurological: She is alert and oriented to person, place, and time. She has normal reflexes.  She displays normal reflexes. No cranial nerve deficit. She exhibits normal muscle tone. Coordination normal.  Skin: Skin is warm and dry.  Psychiatric: She has a normal mood and affect. Her behavior is normal. Judgment and thought content normal.    X-rays were done of the left knee, reported separately.      Assessment & Plan:   Encounter Diagnosis  Name Primary?  . Acute pain of left knee Yes   I am concerned about the giving way and medial pain.  I am concerned she has a medial meniscus tear.  I would like to get a MRI.  PROCEDURE NOTE:  The patient requests injections of the left knee , verbal consent was obtained.  The left knee was prepped appropriately after time out was performed.   Sterile technique was observed and injection of 1 cc of Depo-Medrol 40 mg with several cc's of plain xylocaine. Anesthesia was provided by ethyl chloride and a 20-gauge needle was used to inject the knee area. The injection was tolerated well.  A band aid dressing was applied.  The patient was advised to apply ice later today and tomorrow to the injection sight as needed.  Return after the MRI.  Electronically Signed Sanjuana Kava, MD 12/3/20199:44 AM

## 2018-02-20 ENCOUNTER — Other Ambulatory Visit: Payer: Self-pay

## 2018-02-20 ENCOUNTER — Emergency Department (HOSPITAL_COMMUNITY): Payer: Medicare HMO

## 2018-02-20 ENCOUNTER — Emergency Department (HOSPITAL_COMMUNITY)
Admission: EM | Admit: 2018-02-20 | Discharge: 2018-02-20 | Disposition: A | Discharge status: Home / Self Care | Payer: Medicare HMO | Attending: Emergency Medicine | Admitting: Emergency Medicine

## 2018-02-20 ENCOUNTER — Encounter (HOSPITAL_COMMUNITY): Payer: Self-pay

## 2018-02-20 DIAGNOSIS — E876 Hypokalemia: Secondary | ICD-10-CM

## 2018-02-20 DIAGNOSIS — I10 Essential (primary) hypertension: Secondary | ICD-10-CM | POA: Diagnosis not present

## 2018-02-20 DIAGNOSIS — F17210 Nicotine dependence, cigarettes, uncomplicated: Secondary | ICD-10-CM | POA: Insufficient documentation

## 2018-02-20 DIAGNOSIS — E039 Hypothyroidism, unspecified: Secondary | ICD-10-CM | POA: Diagnosis not present

## 2018-02-20 DIAGNOSIS — R221 Localized swelling, mass and lump, neck: Secondary | ICD-10-CM | POA: Diagnosis present

## 2018-02-20 DIAGNOSIS — L03221 Cellulitis of neck: Secondary | ICD-10-CM | POA: Insufficient documentation

## 2018-02-20 DIAGNOSIS — Z7901 Long term (current) use of anticoagulants: Secondary | ICD-10-CM | POA: Insufficient documentation

## 2018-02-20 DIAGNOSIS — J449 Chronic obstructive pulmonary disease, unspecified: Secondary | ICD-10-CM | POA: Insufficient documentation

## 2018-02-20 DIAGNOSIS — R490 Dysphonia: Secondary | ICD-10-CM | POA: Diagnosis not present

## 2018-02-20 DIAGNOSIS — Z79899 Other long term (current) drug therapy: Secondary | ICD-10-CM | POA: Diagnosis not present

## 2018-02-20 DIAGNOSIS — Z7982 Long term (current) use of aspirin: Secondary | ICD-10-CM | POA: Diagnosis not present

## 2018-02-20 LAB — BASIC METABOLIC PANEL
Anion gap: 7 (ref 5–15)
BUN: 20 mg/dL (ref 8–23)
CO2: 31 mmol/L (ref 22–32)
Calcium: 9.4 mg/dL (ref 8.9–10.3)
Chloride: 101 mmol/L (ref 98–111)
Creatinine, Ser: 1.34 mg/dL — ABNORMAL HIGH (ref 0.44–1.00)
GFR calc Af Amer: 45 mL/min — ABNORMAL LOW (ref >60)
GFR calc non Af Amer: 39 mL/min — ABNORMAL LOW (ref >60)
Glucose, Bld: 109 mg/dL — ABNORMAL HIGH (ref 70–99)
Potassium: 3.2 mmol/L — ABNORMAL LOW (ref 3.5–5.1)
Sodium: 139 mmol/L (ref 135–145)

## 2018-02-20 LAB — CBC WITH DIFFERENTIAL/PLATELET
Abs Immature Granulocytes: 0.09 10*3/uL — ABNORMAL HIGH (ref 0.00–0.07)
Basophils Absolute: 0.1 10*3/uL (ref 0.0–0.1)
Basophils Relative: 1 %
EOS PCT: 3 %
Eosinophils Absolute: 0.3 10*3/uL (ref 0.0–0.5)
HCT: 38.8 % (ref 36.0–46.0)
Hemoglobin: 12.3 g/dL (ref 12.0–15.0)
Immature Granulocytes: 1 %
Lymphocytes Relative: 22 %
Lymphs Abs: 1.9 10*3/uL (ref 0.7–4.0)
MCH: 30.7 pg (ref 26.0–34.0)
MCHC: 31.7 g/dL (ref 30.0–36.0)
MCV: 96.8 fL (ref 80.0–100.0)
Monocytes Absolute: 0.7 10*3/uL (ref 0.1–1.0)
Monocytes Relative: 9 %
Neutro Abs: 5.5 10*3/uL (ref 1.7–7.7)
Neutrophils Relative %: 64 %
Platelets: 226 10*3/uL (ref 150–400)
RBC: 4.01 MIL/uL (ref 3.87–5.11)
RDW: 13.6 % (ref 11.5–15.5)
WBC: 8.6 10*3/uL (ref 4.0–10.5)
nRBC: 0 % (ref 0.0–0.2)

## 2018-02-20 MED ORDER — POTASSIUM CHLORIDE CRYS ER 20 MEQ PO TBCR
40.0000 meq | EXTENDED_RELEASE_TABLET | Freq: Once | ORAL | Status: AC
  Administered 2018-02-20: 40 meq via ORAL
  Filled 2018-02-20: qty 2

## 2018-02-20 MED ORDER — IOHEXOL 300 MG/ML  SOLN
60.0000 mL | Freq: Once | INTRAMUSCULAR | Status: AC | PRN
  Administered 2018-02-20: 60 mL via INTRAVENOUS

## 2018-02-20 MED ORDER — AMOXICILLIN-POT CLAVULANATE 875-125 MG PO TABS
1.0000 | ORAL_TABLET | Freq: Once | ORAL | Status: AC
  Administered 2018-02-20: 1 via ORAL
  Filled 2018-02-20: qty 1

## 2018-02-20 MED ORDER — OXYCODONE HCL 5 MG PO TABS
5.0000 mg | ORAL_TABLET | Freq: Once | ORAL | Status: AC
  Administered 2018-02-20: 5 mg via ORAL
  Filled 2018-02-20: qty 1

## 2018-02-20 MED ORDER — ONDANSETRON HCL 4 MG PO TABS
4.0000 mg | ORAL_TABLET | Freq: Once | ORAL | Status: AC
  Administered 2018-02-20: 4 mg via ORAL
  Filled 2018-02-20: qty 1

## 2018-02-20 NOTE — ED Provider Notes (Signed)
[facility] EMERGENCY DEPARTMENT Provider Note   CSN: [id number] Arrival date & time: 02/20/18  1107     History   Chief Complaint Chief Complaint  Patient presents with  . Abscess    HPI Kyndall Amero is a 72 y.o. female.  Patient is a 72 year old female who presents to the emergency department because of an abscess on the side of her neck.  The patient states this problem started on Monday, December 2 at which time she just saw a small bump.  It progressed to get large and then seemingly going from the side of her neck to deep inside of her neck area.  The patient states that now she feels that the swelling is affecting her swallowing.  She says that she noticed some hoarseness of her voice that was not there last week.  There is no cough recently.  Is been no injury to the neck or throat area.  No high fever reported.  No choking noted.  The patient has been seen by urgent care and by her physician.  She is recently been placed on an antibiotic, and was then told to come to the emergency department for additional evaluation.  The history is provided by the patient.  Abscess    Past Medical History:  Diagnosis Date  . Anxiety   . Carpal tunnel syndrome   . COPD (chronic obstructive pulmonary disease) ([place])   . Gastric ulcer   . GERD (gastroesophageal reflux disease)   . Hyperglycemia   . Hyperlipidemia   . Hypertension   . Hypothyroidism   . Osteoarthritis   . Peptic ulcer disease   . Pneumonia 2010  . Renal insufficiency   . 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 Stroke ([place]) 1990s   mild  . TIA (transient ischemic attack)     Patient Active Problem List   Diagnosis Date Noted  . Acute metabolic encephalopathy 95/11/3265  . Gait instability 10/03/2017  . Dysarthria 10/03/2017  . Essential hypertension 10/03/2017  . Slurred speech   . Abdominal mass, RUQ (right upper quadrant) 03/13/2017  . Abdominal pain 03/13/2017  . RUQ pain 03/13/2017  . Dysphagia   . TIA  (transient ischemic attack) 01/03/2016  . Absolute anemia 07/03/2015  . Proctalgia   . Rectal pain 01/04/2015  . Hemorrhoids 01/04/2015  . Diarrhea 01/04/2015  . Gastric ulceration   . Gastric ulcer 11/29/2014  . Toxic metabolic encephalopathy 12/45/8099  . UTI (lower urinary tract infection) 09/19/2014  . ARF (acute renal failure) ([place])   . LUQ pain 07/28/2014  . Nausea with vomiting 07/28/2014  . Bilateral lower extremity edema 07/28/2014  . Thrombocytopenia ([place]) 04/12/2014  . RLQ abdominal pain 01/22/2012  . Constipation 01/22/2012  . GERD (gastroesophageal reflux disease) 01/22/2012    Past Surgical History:  Procedure Laterality Date  . ABDOMINAL HYSTERECTOMY    . APPENDECTOMY    . BACK SURGERY    . BONE MARROW ASPIRATION Left 04/22/14  . BONE MARROW BIOPSY Left 04/22/14  . CHOLECYSTECTOMY    . COLONOSCOPY     ?2005, [place]  . COLONOSCOPY N/A 04/07/2015   IPJ:ASNKNL  . COLONOSCOPY WITH ESOPHAGOGASTRODUODENOSCOPY (EGD)  02/11/2012   RMR: Ulcerative/erosive reflux esophagitis, benign appearing gastric ulcer with unremarkable biopsy, no H pylori. Colonoscopy was unremarkable. Next colonoscopy in 2023  . CYSTECTOMY     cyst removed from rectal area.   . ESOPHAGOGASTRODUODENOSCOPY  01/05/2003   RMR: Normal esophagus, small hiatal hernia/ A couple of tiny antral  erosions, otherwise normal stomach normal D1 and D2.   . ESOPHAGOGASTRODUODENOSCOPY N/A 08/11/2014   PNT:IRWERXV ulcer/HH  . ESOPHAGOGASTRODUODENOSCOPY N/A 12/01/2014   QMG:[id number] improved gastric ulcerations/p bx  . ESOPHAGOGASTRODUODENOSCOPY N/A 01/31/2016   Procedure: ESOPHAGOGASTRODUODENOSCOPY (EGD);  Surgeon: Daneil Dolin, MD;  Location: [facility] END[facility] SUITE;  Service: Endoscopy;  Laterality: N/A;  745  . HEMORRHOID SURGERY    . JOINT REPLACEMENT Right    knee  . MALONEY DILATION N/A 01/31/2016   Procedure: Venia Minks DILATION;  Surgeon: Daneil Dolin, MD;  Location: [facility] ENDO SUITE;  Service:  Endoscopy;  Laterality: N/A;     OB History    Gravida  4   Para      Term      Preterm      AB      Living  3     SAB      TAB      Ectopic      Multiple      Live Births               Home Medications    Prior to Admission medications   Medication Sig Start Date End Date Taking? Authorizing Provider  albuterol (PROVENTIL HFA;VENTOLIN HFA) 108 (90 Base) MCG/ACT inhaler Inhale 2 puffs into the lungs every 4 (four) hours as needed for wheezing or shortness of breath. 08/10/17   Noemi Chapel, MD  allopurinol (ZYLOPRIM) 100 MG tablet  01/20/18   [provider]  aspirin EC 81 MG EC tablet Take 2 tablets (162 mg total) by mouth daily. 01/05/16   Thurnell Lose, MD  budesonide-formoterol (SYMBICORT) 160-4.5 MCG/ACT inhaler Inhale 2 puffs into the lungs 2 (two) times daily as needed (shortness of breath).     [provider]  carvedilol (COREG) 12.5 MG tablet 12.5 mg 2 (two) times daily with a meal.  07/08/17   [provider]  Cholecalciferol (VITAMIN D-3) 5000 UNITS TABS Take 1 tablet by mouth daily.    [provider]  COLCRYS 0.6 MG tablet  01/20/18   [provider]  DEXILANT 60 MG capsule  01/29/18   [provider]  diltiazem (TIAZAC) 360 MG 24 hr capsule  01/29/18   [provider]  ELIQUIS 5 MG TABS tablet  01/29/18   [provider]  furosemide (LASIX) 20 MG tablet Take 20 mg by mouth daily.  09/26/17   [provider]  gabapentin (NEURONTIN) 300 MG capsule 300 mg 3 (three) times daily.  07/08/17   [provider]  hydrOXYzine (ATARAX/VISTARIL) 25 MG tablet Take 25 mg by mouth every 4 (four) hours as needed for itching.  08/13/17   [provider]  levothyroxine (SYNTHROID, LEVOTHROID) 125 MCG tablet Take 125 mcg by mouth daily before breakfast.    [provider]  montelukast (SINGULAIR) 10 MG tablet  01/29/18   [provider]  naloxegol oxalate  (MOVANTIK) 12.5 MG TABS tablet Take 1 tablet (12.5 mg total) by mouth daily. 12/29/17   Annitta Needs, NP  oxybutynin (DITROPAN) 5 MG tablet TAKE (1) TABLET BY MOUTH THREE TIMES DAILY 04/26/17   Florian Buff, MD  oxyCODONE-acetaminophen (PERCOCET/ROXICET) 5-325 MG tablet 1 tablet every 6 (six) hours as needed for moderate pain or severe pain.  06/09/15   [provider]  polyethylene glycol powder (GLYCOLAX/MIRALAX) powder 17 grams orally daily for constipation 03/13/17   Mahala Menghini, PA-C  potassium chloride SA (K-DUR,KLOR-CON) 20 MEQ tablet  07/08/17  [provider]  pravastatin (PRAVACHOL) 40 MG tablet Take 40 mg by mouth daily.    [provider]  ranitidine (ZANTAC) 150 MG tablet Take 150 mg by mouth at bedtime.  12/08/14   [provider]  sertraline (ZOLOFT) 50 MG tablet  07/08/17   [provider]  Milton Ferguson 18 MCG inhalation capsule  08/19/17   [provider]  traZODone (DESYREL) 150 MG tablet  10/01/17   [provider]  triamterene-hydrochlorothiazide (MAXZIDE-25) 37.5-25 MG tablet  01/29/18   [provider]    Family History Family History  Problem Relation Age of Onset  . Other Mother        died age 61, natural causes  . Stroke Mother   . Diabetes Daughter   . Healthy Son   . Healthy Daughter   . Healthy Daughter   . Colon cancer Neg Hx   . Liver disease Neg Hx     Social History Social History   Tobacco Use  . Smoking status: Current Every Day Smoker    Packs/day: 1.50    Years: 58.00    Pack years: 87.00    Types: Cigarettes  . Smokeless tobacco: Never Used  Substance Use Topics  . Alcohol use: No    Alcohol/week: 0.0 standard drinks  . Drug use: No     Allergies   Tetracyclines & related and Shellfish-derived products   Review of Systems Review of Systems  Constitutional: Negative for activity change.       All ROS Neg except as noted in HPI  HENT: Negative for  nosebleeds.        Hoarseness  Eyes: Negative for photophobia and discharge.  Respiratory: Negative for cough, shortness of breath and wheezing.   Cardiovascular: Negative for chest pain and palpitations.  Gastrointestinal: Negative for abdominal pain and blood in stool.  Genitourinary: Negative for dysuria, frequency and hematuria.  Musculoskeletal: Positive for neck pain. Negative for arthralgias and back pain.  Skin: Negative.   Neurological: Negative for dizziness, seizures and speech difficulty.  Psychiatric/Behavioral: Negative for confusion and hallucinations.     Physical Exam Updated Vital Signs BP 122/64 (BP Location: Right Arm)   Pulse 74   Temp 97.9 F (36.6 C) (Oral)   Resp 18   SpO2 94%   Physical Exam  Constitutional: She is oriented to person, place, and time. She appears well-developed and well-nourished.  Non-toxic appearance.  HENT:  Head: Normocephalic.  Right Ear: Tympanic membrane and external ear normal.  Left Ear: Tympanic membrane and external ear normal.  The airway is patent.  The speech is hoarse.  Uvula is in the midline.  No swelling of the tongue, and no swelling under the tongue.  Eyes: Pupils are equal, round, and reactive to light. EOM and lids are normal.  Neck: Normal range of motion. Neck supple. Carotid bruit is not present.  There is a red raised tender area from the lower neck extending toward the sternal notch on the right.  Its tender to touch and warm.  The trachea is in the midline.  Cardiovascular: Normal rate, regular rhythm, normal heart sounds, intact distal pulses and normal pulses.  Pulmonary/Chest: Breath sounds normal. No respiratory distress.  Abdominal: Soft. Bowel sounds are normal. There is no tenderness. There is no guarding.  Musculoskeletal: Normal range of motion.  Lymphadenopathy:       Head (right side): No submandibular adenopathy present.       Head (left side): No submandibular  adenopathy present.    She has no  cervical adenopathy.  Neurological: She is alert and oriented to person, place, and time. She has normal strength. No cranial nerve deficit or sensory deficit.  Skin: Skin is warm and dry.  Psychiatric: She has a normal mood and affect. Her speech is normal.  Nursing note and vitals reviewed.    ED Treatments / Results  Labs (all labs ordered are listed, but only abnormal results are displayed) Labs Reviewed  BASIC METABOLIC PANEL - Abnormal; Notable for the following components:      Result Value   Potassium 3.2 (*)    Glucose, Bld 109 (*)    Creatinine, Ser 1.34 (*)    GFR calc non Af Amer 39 (*)    GFR calc Af Amer 45 (*)    All other components within normal limits  CBC WITH DIFFERENTIAL/PLATELET - Abnormal; Notable for the following components:   Abs Immature Granulocytes 0.09 (*)    All other components within normal limits    EKG None  Radiology Ct Soft Tissue Neck W Contrast  Result Date: 02/20/2018 CLINICAL DATA:  Right neck mass. Hoarseness and cough. Severe pain. EXAM: CT NECK WITH CONTRAST TECHNIQUE: Multidetector CT imaging of the neck was performed using the standard protocol following the bolus administration of intravenous contrast. CONTRAST:  37m OMNIPAQUE IOHEXOL 300 MG/ML  SOLN COMPARISON:  None. FINDINGS: Pharynx and larynx: Asymmetric thickening of the aryepiglottic fold on the right. Possible mass lesion. Epiglottis normal. Normal pharynx. Normal airway. Salivary glands: No inflammation, mass, or stone. Thyroid: Small thyroid without focal lesion Lymph nodes: No enlarged lymph nodes in the neck. Vascular: Normal enhancement of the carotid and jugular vein bilaterally. Limited intracranial: Negative Visualized orbits: Negative Mastoids and visualized paranasal sinuses: Mild mucosal edema paranasal sinuses. Mastoid clear bilaterally. Skeleton: Multilevel cervical spine degenerative change. Large anterior osteophytes C2 through T2. Upper chest: Lung apices clear  bilaterally. Other: Edema in the subcutaneous tissues of the right lateral lower neck extending down to the sternal notch. There is associated lobular skin thickening in this area. There is thickening of the platysmas muscle on the right. No soft tissue abscess. IMPRESSION: Asymmetric thickening of right aryepiglottic fold. Possible neoplasm. Recommend ENT evaluation and direct laryngoscopy Subcutaneous edema right lower neck with skin thickening. This process is most consistent with cellulitis without abscess. Recommend antibiotic treatment and close follow-up. These results were called by telephone at the time of interpretation on 02/20/2018 at 4:46 pm to [place], who verbally acknowledged these results. Electronically Signed   By: CFranchot GalloM.D.   On: 02/20/2018 16:47    Procedures Procedures (including critical care time)  Medications Ordered in ED Medications  oxyCODONE (Oxy IR/ROXICODONE) immediate release tablet 5 mg (5 mg Oral Given 02/20/18 1513)  ondansetron (ZOFRAN) tablet 4 mg (4 mg Oral Given 02/20/18 1513)  iohexol (OMNIPAQUE) 300 MG/ML solution 60 mL (60 mLs Intravenous Contrast Given 02/20/18 1619)     Initial Impression / Assessment and Plan / ED Course  I have reviewed the triage vital signs and the nursing notes.  Pertinent labs & imaging results that were available during my care of the patient were reviewed by me and considered in my medical decision making (see chart for details).       Final Clinical Impressions(s) / ED Diagnoses MDm  Vital signs are within normal limits.  Pulse oximetry is 94% on room air.  The examination favors cellulitis/abscess.  Is concerned because of  the swollen area on the right neck, increased pain, and now hoarseness that was not there a week ago.  Will obtain soft tissue CT scan.  The CT scan suggest cellulitis without fluid collection.  There is noticed swelling of the epiglottic folds on the right.  Question if this is  inflammation, or if there could be a mass present.  The patient is to be referred to ear nose and throat.  Patient got some relief from pain medication given while here in the emergency department.  The patient is to see Dr. Redmond Baseman for ear nose and throat evaluation as soon as possible.  The patient has Augmentin.  The patient also has oxycodone to use for pain.  Patient is in agreement with this plan.   Final diagnoses:  Cellulitis, neck  Hoarseness  Hypokalemia    ED Discharge Orders    None       Lily Kocher, PA-C 02/20/18 [place], [place], DO 02/22/18 [id number]

## 2018-02-20 NOTE — ED Triage Notes (Signed)
Pt has an abscess to the right side of her neck. Started Monday and has progressively gotten worse. Went to Urgent Care and they advised her to come here. Pt states the swelling is messing with her swallowing. Rates pain as a 9/10.

## 2018-02-20 NOTE — Discharge Instructions (Addendum)
Your vital signs are within normal limits.  The CT scan of your neck suggests cellulitis of your right neck.  Please continue the Augmentin twice a day with food.  Warm compress to your neck may be helpful.  Use Tylenol extra strength for mild pain, use your current oxycodone for more severe pain.  Please see Dr. Jenne PaneBates for ear nose and throat evaluation concerning the thickening of the vocal cords.  Please increase fluids.  Soft foods may be more comfortable until after you are seen by Dr. Jenne PaneBates.

## 2018-02-25 ENCOUNTER — Ambulatory Visit (HOSPITAL_COMMUNITY)
Admission: RE | Admit: 2018-02-25 | Discharge: 2018-02-25 | Disposition: A | Discharge status: Home / Self Care | Payer: Medicare HMO | Source: Ambulatory Visit | Attending: Pulmonary Disease | Admitting: Pulmonary Disease

## 2018-02-25 DIAGNOSIS — J441 Chronic obstructive pulmonary disease with (acute) exacerbation: Secondary | ICD-10-CM

## 2018-02-25 MED ORDER — ALBUTEROL SULFATE (2.5 MG/3ML) 0.083% IN NEBU: 2.5000 mg | INHALATION_SOLUTION | Freq: Once | RESPIRATORY_TRACT | Status: DC

## 2018-02-25 NOTE — Progress Notes (Signed)
PFT Lab Placed a call to Dr. Juanetta GoslingHawkins.  Pt unable to do PFT today due to dizziness and coughing up thick green secretions.  Pt was given number to reschedule to do pft.

## 2018-03-09 ENCOUNTER — Ambulatory Visit (INDEPENDENT_AMBULATORY_CARE_PROVIDER_SITE_OTHER): Payer: Medicare HMO | Admitting: Otolaryngology

## 2018-03-09 DIAGNOSIS — R1312 Dysphagia, oropharyngeal phase: Secondary | ICD-10-CM | POA: Diagnosis not present

## 2018-03-09 DIAGNOSIS — K219 Gastro-esophageal reflux disease without esophagitis: Secondary | ICD-10-CM | POA: Diagnosis not present

## 2018-03-09 DIAGNOSIS — R490 Dysphonia: Secondary | ICD-10-CM | POA: Diagnosis not present

## 2018-03-09 DIAGNOSIS — F17210 Nicotine dependence, cigarettes, uncomplicated: Secondary | ICD-10-CM

## 2018-03-10 ENCOUNTER — Other Ambulatory Visit: Payer: Self-pay | Admitting: Otolaryngology

## 2018-03-10 ENCOUNTER — Other Ambulatory Visit (HOSPITAL_COMMUNITY): Payer: Self-pay | Admitting: Otolaryngology

## 2018-03-10 DIAGNOSIS — R1310 Dysphagia, unspecified: Secondary | ICD-10-CM

## 2018-03-16 ENCOUNTER — Ambulatory Visit (HOSPITAL_COMMUNITY)
Admission: RE | Admit: 2018-03-16 | Discharge: 2018-03-16 | Disposition: A | Discharge status: Home / Self Care | Payer: Medicare HMO | Source: Ambulatory Visit | Attending: Otolaryngology | Admitting: Otolaryngology

## 2018-03-16 DIAGNOSIS — R1310 Dysphagia, unspecified: Secondary | ICD-10-CM | POA: Diagnosis not present

## 2018-03-28 ENCOUNTER — Other Ambulatory Visit: Payer: Self-pay

## 2018-03-28 ENCOUNTER — Encounter (HOSPITAL_COMMUNITY): Payer: Self-pay | Admitting: Emergency Medicine

## 2018-03-28 ENCOUNTER — Emergency Department (HOSPITAL_COMMUNITY): Payer: Medicare Other

## 2018-03-28 ENCOUNTER — Observation Stay (HOSPITAL_COMMUNITY)
Admission: EM | Admit: 2018-03-28 | Discharge: 2018-03-31 | Disposition: A | Discharge status: Home / Self Care | Payer: Medicare Other | Attending: Family Medicine | Admitting: Family Medicine

## 2018-03-28 DIAGNOSIS — X58XXXA Exposure to other specified factors, initial encounter: Secondary | ICD-10-CM | POA: Insufficient documentation

## 2018-03-28 DIAGNOSIS — N183 Chronic kidney disease, stage 3 unspecified: Secondary | ICD-10-CM | POA: Diagnosis present

## 2018-03-28 DIAGNOSIS — Y929 Unspecified place or not applicable: Secondary | ICD-10-CM | POA: Diagnosis not present

## 2018-03-28 DIAGNOSIS — S92415A Nondisplaced fracture of proximal phalanx of left great toe, initial encounter for closed fracture: Secondary | ICD-10-CM | POA: Insufficient documentation

## 2018-03-28 DIAGNOSIS — E785 Hyperlipidemia, unspecified: Secondary | ICD-10-CM | POA: Diagnosis present

## 2018-03-28 DIAGNOSIS — J449 Chronic obstructive pulmonary disease, unspecified: Secondary | ICD-10-CM | POA: Diagnosis not present

## 2018-03-28 DIAGNOSIS — I129 Hypertensive chronic kidney disease with stage 1 through stage 4 chronic kidney disease, or unspecified chronic kidney disease: Secondary | ICD-10-CM | POA: Diagnosis not present

## 2018-03-28 DIAGNOSIS — N390 Urinary tract infection, site not specified: Secondary | ICD-10-CM | POA: Diagnosis present

## 2018-03-28 DIAGNOSIS — D631 Anemia in chronic kidney disease: Secondary | ICD-10-CM | POA: Diagnosis present

## 2018-03-28 DIAGNOSIS — Z96651 Presence of right artificial knee joint: Secondary | ICD-10-CM | POA: Insufficient documentation

## 2018-03-28 DIAGNOSIS — R55 Syncope and collapse: Secondary | ICD-10-CM | POA: Diagnosis present

## 2018-03-28 DIAGNOSIS — K219 Gastro-esophageal reflux disease without esophagitis: Secondary | ICD-10-CM | POA: Diagnosis not present

## 2018-03-28 DIAGNOSIS — F17210 Nicotine dependence, cigarettes, uncomplicated: Secondary | ICD-10-CM | POA: Insufficient documentation

## 2018-03-28 DIAGNOSIS — Y939 Activity, unspecified: Secondary | ICD-10-CM | POA: Diagnosis not present

## 2018-03-28 DIAGNOSIS — Y999 Unspecified external cause status: Secondary | ICD-10-CM | POA: Diagnosis not present

## 2018-03-28 DIAGNOSIS — I10 Essential (primary) hypertension: Secondary | ICD-10-CM | POA: Diagnosis not present

## 2018-03-28 DIAGNOSIS — N189 Chronic kidney disease, unspecified: Secondary | ICD-10-CM

## 2018-03-28 DIAGNOSIS — Z720 Tobacco use: Secondary | ICD-10-CM | POA: Diagnosis present

## 2018-03-28 DIAGNOSIS — W19XXXA Unspecified fall, initial encounter: Secondary | ICD-10-CM

## 2018-03-28 DIAGNOSIS — Y92009 Unspecified place in unspecified non-institutional (private) residence as the place of occurrence of the external cause: Secondary | ICD-10-CM

## 2018-03-28 LAB — BASIC METABOLIC PANEL
Anion gap: 9 (ref 5–15)
BUN: 19 mg/dL (ref 8–23)
CO2: 25 mmol/L (ref 22–32)
Calcium: 9.6 mg/dL (ref 8.9–10.3)
Chloride: 101 mmol/L (ref 98–111)
Creatinine, Ser: 1.19 mg/dL — ABNORMAL HIGH (ref 0.44–1.00)
GFR calc non Af Amer: 45 mL/min — ABNORMAL LOW (ref >60)
GFR, EST AFRICAN AMERICAN: 52 mL/min — AB (ref >60)
Glucose, Bld: 103 mg/dL — ABNORMAL HIGH (ref 70–99)
Potassium: 3.9 mmol/L (ref 3.5–5.1)
SODIUM: 135 mmol/L (ref 135–145)

## 2018-03-28 LAB — CBC
HCT: 33.8 % — ABNORMAL LOW (ref 36.0–46.0)
Hemoglobin: 10.7 g/dL — ABNORMAL LOW (ref 12.0–15.0)
MCH: 29.4 pg (ref 26.0–34.0)
MCHC: 31.7 g/dL (ref 30.0–36.0)
MCV: 92.9 fL (ref 80.0–100.0)
NRBC: 0 % (ref 0.0–0.2)
Platelets: 243 10*3/uL (ref 150–400)
RBC: 3.64 MIL/uL — ABNORMAL LOW (ref 3.87–5.11)
RDW: 12.7 % (ref 11.5–15.5)
WBC: 6 10*3/uL (ref 4.0–10.5)

## 2018-03-28 LAB — URINALYSIS, ROUTINE W REFLEX MICROSCOPIC
Bilirubin Urine: NEGATIVE
GLUCOSE, UA: NEGATIVE mg/dL
Hgb urine dipstick: NEGATIVE
Ketones, ur: NEGATIVE mg/dL
Nitrite: POSITIVE — AB
Protein, ur: NEGATIVE mg/dL
SPECIFIC GRAVITY, URINE: 1.02 (ref 1.005–1.030)
pH: 5 (ref 5.0–8.0)

## 2018-03-28 LAB — CBG MONITORING, ED: Glucose-Capillary: 99 mg/dL (ref 70–99)

## 2018-03-28 LAB — TSH: TSH: 11.231 u[IU]/mL — ABNORMAL HIGH (ref 0.350–4.500)

## 2018-03-28 LAB — I-STAT TROPONIN, ED: Troponin i, poc: 0 ng/mL (ref 0.00–0.08)

## 2018-03-28 MED ORDER — SODIUM CHLORIDE 0.9% FLUSH
3.0000 mL | Freq: Two times a day (BID) | INTRAVENOUS | Status: DC
  Administered 2018-03-29 – 2018-03-31 (×4): 3 mL via INTRAVENOUS

## 2018-03-28 MED ORDER — NICOTINE 21 MG/24HR TD PT24
21.0000 mg | MEDICATED_PATCH | Freq: Every day | TRANSDERMAL | Status: DC
  Administered 2018-03-28 – 2018-03-31 (×4): 21 mg via TRANSDERMAL
  Filled 2018-03-28 (×4): qty 1

## 2018-03-28 MED ORDER — ONDANSETRON HCL 4 MG/2ML IJ SOLN: 4.0000 mg | Freq: Four times a day (QID) | INTRAMUSCULAR | Status: DC | PRN

## 2018-03-28 MED ORDER — DILTIAZEM HCL ER COATED BEADS 120 MG PO CP24
ORAL_CAPSULE | ORAL | Status: AC
  Filled 2018-03-28: qty 1

## 2018-03-28 MED ORDER — MOMETASONE FURO-FORMOTEROL FUM 200-5 MCG/ACT IN AERO
2.0000 | INHALATION_SPRAY | Freq: Two times a day (BID) | RESPIRATORY_TRACT | Status: DC
  Administered 2018-03-28 – 2018-03-31 (×6): 2 via RESPIRATORY_TRACT
  Filled 2018-03-28 (×2): qty 8.8

## 2018-03-28 MED ORDER — SODIUM CHLORIDE 0.9 % IV SOLN
INTRAVENOUS | Status: DC
  Administered 2018-03-29 – 2018-03-30 (×2): via INTRAVENOUS

## 2018-03-28 MED ORDER — GABAPENTIN 300 MG PO CAPS
300.0000 mg | ORAL_CAPSULE | Freq: Three times a day (TID) | ORAL | Status: DC
  Administered 2018-03-28 – 2018-03-31 (×8): 300 mg via ORAL
  Filled 2018-03-28 (×5): qty 1
  Filled 2018-03-28: qty 3
  Filled 2018-03-28 (×2): qty 1

## 2018-03-28 MED ORDER — CEPHALEXIN 500 MG PO CAPS
500.0000 mg | ORAL_CAPSULE | Freq: Once | ORAL | Status: DC
  Filled 2018-03-28: qty 1

## 2018-03-28 MED ORDER — ACETAMINOPHEN 650 MG RE SUPP: 650.0000 mg | Freq: Four times a day (QID) | RECTAL | Status: DC | PRN

## 2018-03-28 MED ORDER — TRAZODONE HCL 50 MG PO TABS
150.0000 mg | ORAL_TABLET | Freq: Every day | ORAL | Status: DC
  Administered 2018-03-28 – 2018-03-30 (×3): 150 mg via ORAL
  Filled 2018-03-28 (×3): qty 3

## 2018-03-28 MED ORDER — IPRATROPIUM-ALBUTEROL 0.5-2.5 (3) MG/3ML IN SOLN: 3.0000 mL | RESPIRATORY_TRACT | Status: DC | PRN

## 2018-03-28 MED ORDER — DILTIAZEM HCL ER COATED BEADS 240 MG PO CP24
ORAL_CAPSULE | ORAL | Status: AC
  Filled 2018-03-28: qty 1

## 2018-03-28 MED ORDER — ONDANSETRON HCL 4 MG PO TABS: 4.0000 mg | ORAL_TABLET | Freq: Four times a day (QID) | ORAL | Status: DC | PRN

## 2018-03-28 MED ORDER — PRAVASTATIN SODIUM 40 MG PO TABS
40.0000 mg | ORAL_TABLET | Freq: Every day | ORAL | Status: DC
  Administered 2018-03-29 – 2018-03-30 (×2): 40 mg via ORAL
  Filled 2018-03-28 (×2): qty 1

## 2018-03-28 MED ORDER — SODIUM CHLORIDE 0.9 % IV SOLN
1.0000 g | INTRAVENOUS | Status: AC
  Administered 2018-03-28 – 2018-03-30 (×3): 1 g via INTRAVENOUS
  Filled 2018-03-28: qty 1
  Filled 2018-03-28: qty 10
  Filled 2018-03-28: qty 1

## 2018-03-28 MED ORDER — APIXABAN 5 MG PO TABS
5.0000 mg | ORAL_TABLET | Freq: Two times a day (BID) | ORAL | Status: DC
  Administered 2018-03-28 – 2018-03-31 (×6): 5 mg via ORAL
  Filled 2018-03-28 (×6): qty 1

## 2018-03-28 MED ORDER — ASPIRIN EC 81 MG PO TBEC
162.0000 mg | DELAYED_RELEASE_TABLET | Freq: Every day | ORAL | Status: DC
  Administered 2018-03-29 – 2018-03-31 (×3): 162 mg via ORAL
  Filled 2018-03-28 (×3): qty 2

## 2018-03-28 MED ORDER — HYDROCODONE-ACETAMINOPHEN 5-325 MG PO TABS
1.0000 | ORAL_TABLET | Freq: Once | ORAL | Status: AC
  Administered 2018-03-28: 1 via ORAL
  Filled 2018-03-28: qty 1

## 2018-03-28 MED ORDER — FAMOTIDINE 20 MG PO TABS
20.0000 mg | ORAL_TABLET | Freq: Every day | ORAL | Status: DC
  Administered 2018-03-28 – 2018-03-30 (×3): 20 mg via ORAL
  Filled 2018-03-28 (×3): qty 1

## 2018-03-28 MED ORDER — PANTOPRAZOLE SODIUM 40 MG PO TBEC
40.0000 mg | DELAYED_RELEASE_TABLET | Freq: Every day | ORAL | Status: DC
  Administered 2018-03-28 – 2018-03-31 (×4): 40 mg via ORAL
  Filled 2018-03-28 (×4): qty 1

## 2018-03-28 MED ORDER — LEVOTHYROXINE SODIUM 50 MCG PO TABS
125.0000 ug | ORAL_TABLET | Freq: Every day | ORAL | Status: DC
  Administered 2018-03-29: 125 ug via ORAL
  Filled 2018-03-28: qty 3

## 2018-03-28 MED ORDER — POLYETHYLENE GLYCOL 3350 17 G PO PACK: 17.0000 g | PACK | Freq: Every day | ORAL | Status: DC | PRN

## 2018-03-28 MED ORDER — SERTRALINE HCL 50 MG PO TABS
50.0000 mg | ORAL_TABLET | Freq: Every day | ORAL | Status: DC
  Administered 2018-03-28 – 2018-03-31 (×4): 50 mg via ORAL
  Filled 2018-03-28 (×4): qty 1

## 2018-03-28 MED ORDER — OXYCODONE-ACETAMINOPHEN 5-325 MG PO TABS
1.0000 | ORAL_TABLET | Freq: Four times a day (QID) | ORAL | Status: DC | PRN
  Administered 2018-03-28 – 2018-03-29 (×4): 2 via ORAL
  Administered 2018-03-30 (×2): 1 via ORAL
  Filled 2018-03-28: qty 2
  Filled 2018-03-28 (×3): qty 1
  Filled 2018-03-28 (×3): qty 2

## 2018-03-28 MED ORDER — TIOTROPIUM BROMIDE MONOHYDRATE 18 MCG IN CAPS
18.0000 ug | ORAL_CAPSULE | Freq: Every day | RESPIRATORY_TRACT | Status: DC
  Administered 2018-03-29 – 2018-03-31 (×3): 18 ug via RESPIRATORY_TRACT
  Filled 2018-03-28 (×2): qty 5

## 2018-03-28 MED ORDER — CARVEDILOL 12.5 MG PO TABS
12.5000 mg | ORAL_TABLET | Freq: Two times a day (BID) | ORAL | Status: DC
  Administered 2018-03-29 – 2018-03-31 (×5): 12.5 mg via ORAL
  Filled 2018-03-28 (×5): qty 1

## 2018-03-28 MED ORDER — ALLOPURINOL 100 MG PO TABS
100.0000 mg | ORAL_TABLET | Freq: Every day | ORAL | Status: DC
  Administered 2018-03-29 – 2018-03-31 (×3): 100 mg via ORAL
  Filled 2018-03-28 (×3): qty 1

## 2018-03-28 MED ORDER — DILTIAZEM HCL ER BEADS 240 MG PO CP24
360.0000 mg | ORAL_CAPSULE | Freq: Every day | ORAL | Status: DC
  Administered 2018-03-28 – 2018-03-31 (×4): 360 mg via ORAL
  Filled 2018-03-28 (×9): qty 1

## 2018-03-28 MED ORDER — ACETAMINOPHEN 325 MG PO TABS
650.0000 mg | ORAL_TABLET | Freq: Four times a day (QID) | ORAL | Status: DC | PRN
  Administered 2018-03-29 – 2018-03-30 (×3): 650 mg via ORAL
  Filled 2018-03-28 (×3): qty 2

## 2018-03-28 NOTE — H&P (Signed)
History and Physical  Connie Ponce QJF:[id number] DOB: 1945-06-04 DOA: 03/28/2018  Referring physician: Sedonia Small PCP: [name], NP   Chief Complaint: passed out   HPI: Connie Ponce is a 73 y.o. female with complex past medical history including CVA, hypertension, hyperlipidemia, COPD with ongoing tobacco abuse, who presented to the emergency department after reporting a loss of consciousness that happened last night that she describes as a "blackout spell ".  The patient reports that her daughter called her into another room to see something and the patient stood up from a seated position and apparently passed out for approximately 3 to 4 minutes.  The patient does not remember exactly what happened.  The patient says she did not have any prior chest pain symptoms or shortness of breath.  She does not have a history of syncope or seizures.  The patient denies dizziness.  The patient apparently had urinary incontinence during the episode of loss of consciousness that lasted 3 to 4 minutes.  When the patient woke up she was very groggy but then quickly returned to her baseline.  She has pain and soreness from her fall.  She reports left foot left ankle pain.  She does not think that she hit her head.  She reports tenderness to the left nipple from the fall.  The patient is being admitted for syncope and loss of consciousness.  ED course: Patient was evaluated in the ED and had a trauma work-up done which was positive for a fractured toe in the left foot otherwise no acute findings.  The patient had an abnormal urinalysis and was started on antibiotics.  The patient's EKG revealed left bundle branch block unchanged from previous tests.  CT of the brain without acute findings.  Hemoglobin noted to be 10.7 which is down from recent tests.  WBC normal at 6.0.  Creatinine elevated at 1.19.  Review of Systems: All systems reviewed and apart from history of presenting illness, are negative.  Past  Medical History:  Diagnosis Date  . Anxiety   . Carpal tunnel syndrome   . COPD (chronic obstructive pulmonary disease) ([place])   . Gastric ulcer   . GERD (gastroesophageal reflux disease)   . Hyperglycemia   . Hyperlipidemia   . Hypertension   . Hypothyroidism   . Osteoarthritis   . Peptic ulcer disease   . Pneumonia 2010  . Renal insufficiency   . 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 Stroke ([place]) 1990s   mild  . TIA (transient ischemic attack)    Past Surgical History:  Procedure Laterality Date  . ABDOMINAL HYSTERECTOMY    . APPENDECTOMY    . BACK SURGERY    . BONE MARROW ASPIRATION Left 04/22/14  . BONE MARROW BIOPSY Left 04/22/14  . CHOLECYSTECTOMY    . COLONOSCOPY     ?2005, [place]  . COLONOSCOPY N/A 04/07/2015   [facility]:[id number]  . COLONOSCOPY WITH ESOPHAGOGASTRODUODENOSCOPY (EGD)  02/11/2012   RMR: Ulcerative/erosive reflux esophagitis, benign appearing gastric ulcer with unremarkable biopsy, no H pylori. Colonoscopy was unremarkable. Next colonoscopy in 2023  . CYSTECTOMY     cyst removed from rectal area.   . ESOPHAGOGASTRODUODENOSCOPY  01/05/2003   RMR: Normal esophagus, small hiatal hernia/ A couple of tiny antral erosions, otherwise normal stomach normal D1 and D2.   . ESOPHAGOGASTRODUODENOSCOPY N/A 08/11/2014   GOT:LXBWIOM ulcer/HH  . ESOPHAGOGASTRODUODENOSCOPY N/A 12/01/2014   BTD:[id number] improved gastric ulcerations/p bx  . ESOPHAGOGASTRODUODENOSCOPY N/A 01/31/2016   Procedure:  ESOPHAGOGASTRODUODENOSCOPY (EGD);  Surgeon: Daneil Dolin, MD;  Location: [facility];  Service: Endoscopy;  Laterality: N/A;  745  . HEMORRHOID SURGERY    . JOINT REPLACEMENT Right    knee  . MALONEY DILATION N/A 01/31/2016   Procedure: Venia Minks DILATION;  Surgeon: Daneil Dolin, MD;  Location: [facility];  Service: Endoscopy;  Laterality: N/A;   Social History:  reports that she has been smoking cigarettes. She has a 87.00 pack-year smoking history. She has never used  smokeless tobacco. She reports that she does not drink alcohol or use drugs.  Allergies  Allergen Reactions  . Tetracyclines & Related   . Shellfish-Derived Products Nausea And Vomiting    Family History  Problem Relation Age of Onset  . Other Mother        died age 52, natural causes  . Stroke Mother   . Diabetes Daughter   . Healthy Son   . Healthy Daughter   . Healthy Daughter   . Colon cancer Neg Hx   . Liver disease Neg Hx     Prior to Admission medications   Medication Sig Start Date End Date Taking? Authorizing Provider  albuterol (PROVENTIL HFA;VENTOLIN HFA) 108 (90 Base) MCG/ACT inhaler Inhale 2 puffs into the lungs every 4 (four) hours as needed for wheezing or shortness of breath. 08/10/17  Yes Noemi Chapel, MD  aspirin EC 81 MG EC tablet Take 2 tablets (162 mg total) by mouth daily. 01/05/16  Yes Thurnell Lose, MD  budesonide-formoterol [facility]) 160-4.5 MCG/ACT inhaler Inhale 2 puffs into the lungs 2 (two) times daily as needed (shortness of breath).    Yes [provider]  carvedilol (COREG) 12.5 MG tablet 12.5 mg 2 (two) times daily with a meal.  07/08/17  Yes [provider]  Cholecalciferol (VITAMIN D-3) 5000 UNITS TABS Take 1 tablet by mouth daily.   Yes [provider]  DEXILANT 60 MG capsule Take 60 mg by mouth daily.  01/29/18  Yes [provider]  diltiazem (TIAZAC) 360 MG 24 hr capsule Take 360 mg by mouth daily.  01/29/18  Yes [provider]  ELIQUIS 5 MG TABS tablet Take 5 mg by mouth 2 (two) times daily.  01/29/18  Yes [provider]  furosemide (LASIX) 20 MG tablet Take 20 mg by mouth daily.  09/26/17  Yes [provider]  gabapentin (NEURONTIN) 300 MG capsule 300 mg 3 (three) times daily.  07/08/17  Yes [provider]  hydrOXYzine (ATARAX/VISTARIL) 25 MG tablet Take 25 mg by mouth every 4 (four) hours as needed for itching.  08/13/17  Yes [provider]  levothyroxine  (SYNTHROID, LEVOTHROID) 125 MCG tablet Take 125 mcg by mouth daily before breakfast.   Yes [provider]  potassium chloride SA (K-DUR,KLOR-CON) 20 MEQ tablet  07/08/17  Yes [provider]  pravastatin (PRAVACHOL) 40 MG tablet Take 40 mg by mouth daily.   Yes [provider]  ranitidine (ZANTAC) 150 MG tablet Take 150 mg by mouth at bedtime.  12/08/14  Yes [provider]  sertraline (ZOLOFT) 50 MG tablet  07/08/17  Yes [provider]  traZODone (DESYREL) 150 MG tablet  10/01/17  Yes [provider]  triamterene-hydrochlorothiazide (MAXZIDE-25) 37.5-25 MG tablet  01/29/18  Yes [provider]  allopurinol (ZYLOPRIM) 100 MG tablet Take 100 mg by mouth as needed.  01/20/18   [provider]  oxyCODONE-acetaminophen (PERCOCET/ROXICET) 5-325 MG tablet 1 tablet every 6 (six) hours  as needed for moderate pain or severe pain.  06/09/15   [provider]  SPIRIVA HANDIHALER 18 MCG inhalation capsule Place 18 mcg into inhaler and inhale as needed.  08/19/17   [provider]   Physical Exam: Vitals:   03/28/18 1300 03/28/18 1330 03/28/18 1400 03/28/18 1430  BP: 135/68 119/68 137/65 (!) 141/66  Pulse: 60 63 61 60  Resp: 16 13 10 12   Temp:      TempSrc:      SpO2:      Weight:      Height:        General exam: Moderately built and nourished patient, lying comfortably supine on the gurney in no obvious distress.  Head, eyes and ENT: Nontraumatic and normocephalic. Pupils equally reacting to light and accommodation. Oral mucosa dry.  Neck: Supple. No JVD, carotid bruit or thyromegaly.  Lymphatics: No lymphadenopathy.  Respiratory system: Clear to auscultation. No increased work of breathing.  Cardiovascular system: S1 and S2 heard, RRR. No JVD, murmurs, gallops, clicks or pedal edema.  Gastrointestinal system: Abdomen is nondistended, soft and nontender. Normal bowel sounds heard. No organomegaly or masses  appreciated.  Central nervous system: Alert and oriented. No focal neurological deficits.  Extremities: Symmetric 5 x 5 power. Peripheral pulses symmetrically felt.   Skin: No rashes or acute findings.  Musculoskeletal system: Negative exam.  Psychiatry: Pleasant and cooperative.  Labs on Admission:  Basic Metabolic Panel: Recent Labs  Lab 03/28/18 1142  NA 135  K 3.9  CL 101  CO2 25  GLUCOSE 103*  BUN 19  CREATININE 1.19*  CALCIUM 9.6   Liver Function Tests: No results for input(s): AST, ALT, ALKPHOS, BILITOT, PROT, ALBUMIN in the last 168 hours. No results for input(s): LIPASE, AMYLASE in the last 168 hours. No results for input(s): AMMONIA in the last 168 hours. CBC: Recent Labs  Lab 03/28/18 1142  WBC 6.0  HGB 10.7*  HCT 33.8*  MCV 92.9  PLT 243   Cardiac Enzymes: No results for input(s): CKTOTAL, CKMB, CKMBINDEX, TROPONINI in the last 168 hours.  BNP (last 3 results) No results for input(s): PROBNP in the last 8760 hours. CBG: Recent Labs  Lab 03/28/18 1147  GLUCAP 99    Radiological Exams on Admission: Dg Chest 2 View  Result Date: 03/28/2018 CLINICAL DATA:  Chest pain after fall. EXAM: CHEST - 2 VIEW COMPARISON:  Radiographs of October 03, 2017. FINDINGS: The heart size and mediastinal contours are within normal limits. Both lungs are clear. No pneumothorax or pleural effusion is noted. The visualized skeletal structures are unremarkable. IMPRESSION: No active cardiopulmonary disease. Electronically Signed   By: Marijo Conception, M.D.   On: 03/28/2018 13:42   Dg Ankle Complete Left  Result Date: 03/28/2018 CLINICAL DATA:  Left ankle pain after fall. EXAM: LEFT ANKLE COMPLETE - 3+ VIEW COMPARISON:  None. FINDINGS: There is no evidence of fracture, dislocation, or joint effusion. There is no evidence of arthropathy or other focal bone abnormality. Soft tissues are unremarkable. IMPRESSION: Negative. Electronically Signed   By: Marijo Conception, M.D.   On:  03/28/2018 13:44   Ct Head Wo Contrast  Result Date: 03/28/2018 CLINICAL DATA:  Syncopal episode last night. Patient denies headache or neck pain. EXAM: CT HEAD WITHOUT CONTRAST CT CERVICAL SPINE WITHOUT CONTRAST TECHNIQUE: Multidetector CT imaging of the head and cervical spine was performed following the standard protocol without intravenous contrast. Multiplanar CT image reconstructions of the cervical spine were also generated. COMPARISON:  CT neck dated February 20, 2018. CT head dated October 03, 2017. MRI cervical spine dated January 03, 2016. FINDINGS: CT HEAD FINDINGS Brain: No evidence of acute infarction, hemorrhage, hydrocephalus, extra-axial collection or mass lesion/mass effect. Unchanged old lacunar infarct in the left basal ganglia. Vascular: Atherosclerotic vascular calcification of the carotid siphons. No hyperdense vessel. Skull: Unchanged diffuse calvarial hyperostosis. Negative for fracture or focal lesion. Sinuses/Orbits: No acute finding. Other: None. CT CERVICAL SPINE FINDINGS Alignment: Normal. Skull base and vertebrae: No acute fracture. No primary bone lesion or focal pathologic process. Soft tissues and spinal canal: No prevertebral fluid or swelling. No visible canal hematoma. Disc levels: Disc spaces are relatively preserved. Large bulky ventral osteophytes throughout the cervical spine. Scattered mild facet arthropathy. Upper chest: Mild centrilobular emphysema. Other: Unchanged asymmetric thickening of the right aryepiglottic fold. IMPRESSION: 1.  No acute intracranial abnormality. 2.  No acute cervical spine fracture. 3. Unchanged asymmetric thickening of the right aryepiglottic fold. Correlate with direct visualization. 4.  Emphysema (ICD10-J43.9). Electronically Signed   By: Titus Dubin M.D.   On: 03/28/2018 13:39   Ct Cervical Spine Wo Contrast  Result Date: 03/28/2018 CLINICAL DATA:  Syncopal episode last night. Patient denies headache or neck pain. EXAM: CT HEAD WITHOUT  CONTRAST CT CERVICAL SPINE WITHOUT CONTRAST TECHNIQUE: Multidetector CT imaging of the head and cervical spine was performed following the standard protocol without intravenous contrast. Multiplanar CT image reconstructions of the cervical spine were also generated. COMPARISON:  CT neck dated February 20, 2018. CT head dated October 03, 2017. MRI cervical spine dated January 03, 2016. FINDINGS: CT HEAD FINDINGS Brain: No evidence of acute infarction, hemorrhage, hydrocephalus, extra-axial collection or mass lesion/mass effect. Unchanged old lacunar infarct in the left basal ganglia. Vascular: Atherosclerotic vascular calcification of the carotid siphons. No hyperdense vessel. Skull: Unchanged diffuse calvarial hyperostosis. Negative for fracture or focal lesion. Sinuses/Orbits: No acute finding. Other: None. CT CERVICAL SPINE FINDINGS Alignment: Normal. Skull base and vertebrae: No acute fracture. No primary bone lesion or focal pathologic process. Soft tissues and spinal canal: No prevertebral fluid or swelling. No visible canal hematoma. Disc levels: Disc spaces are relatively preserved. Large bulky ventral osteophytes throughout the cervical spine. Scattered mild facet arthropathy. Upper chest: Mild centrilobular emphysema. Other: Unchanged asymmetric thickening of the right aryepiglottic fold. IMPRESSION: 1.  No acute intracranial abnormality. 2.  No acute cervical spine fracture. 3. Unchanged asymmetric thickening of the right aryepiglottic fold. Correlate with direct visualization. 4.  Emphysema (ICD10-J43.9). Electronically Signed   By: Titus Dubin M.D.   On: 03/28/2018 13:39   Dg Knee Complete 4 Views Right  Result Date: 03/28/2018 CLINICAL DATA:  Right knee pain after fall. EXAM: RIGHT KNEE - COMPLETE 4+ VIEW COMPARISON:  None. FINDINGS: Status post right total knee arthroplasty. The femoral and tibial components appear to be well situated. No fracture or dislocation is noted. No joint effusion is  noted. No soft tissue abnormality is noted. IMPRESSION: No acute abnormality seen in the right knee. Electronically Signed   By: Marijo Conception, M.D.   On: 03/28/2018 13:43   Dg Foot Complete Left  Result Date: 03/28/2018 CLINICAL DATA:  Left foot pain after fall. EXAM: LEFT FOOT - COMPLETE 3+ VIEW COMPARISON:  None. FINDINGS: Possible nondisplaced fracture is seen involving proximal base of first proximal phalanx. No dislocation is noted. Moderate degenerative changes seen involving the first metatarsophalangeal joint. Soft tissues are unremarkable. IMPRESSION: Moderate osteoarthritis of first metatarsophalangeal joint. Possible nondisplaced fracture involving proximal  base of first proximal phalanx. Electronically Signed   By: Marijo Conception, M.D.   On: 03/28/2018 13:48    EKG: Independently reviewed.  Left bundle branch block  Assessment/Plan Principal Problem:   Syncope and collapse Active Problems:   GERD (gastroesophageal reflux disease)   UTI (urinary tract infection)   Essential hypertension   COPD (chronic obstructive pulmonary disease) ([facility])   Tobacco abuse   Fall at home   CKD (chronic kidney disease) stage 3, GFR 30-59 ml/min ([facility])   Anemia in chronic kidney disease (CKD)   Hyperlipidemia   1. Syncope and collapse with loss of consciousness-patient was down for approximately 3 to 4 minutes.  She had some urinary incontinence.  She does not have a history of seizure but does have known cerebrovascular disease status post CVA.  Admitted for observation and further evaluation with neuro checks, orthostatics vital signs, continuous telemetry monitoring, 2D echocardiogram, PT/OT evaluation, EEG testing, fall precautions and seizure precautions recommended.  Follow electrolytes closely.  Check TSH. 2. UTI- obtain urine culture.  Continue ceftriaxone IV. 3. Dehydration-hold diuretics temporarily, hydrate gently with IV fluids.  Monitor electrolytes. 4. Anemia of CKD- follow  hemoglobin closely, Hemoccult stool x3.  Obtain anemia panel. 5. Stage III CKD- gently hydrating with IV fluids, monitor BMP.  Avoid nephrotoxins as possible.  Renally dose medications. 6. Hyperlipidemia-resume home statin medication. 7. Tobacco abuse-patient strongly advised to discontinue all tobacco use.  Will provide a nicotine patch for cravings. 8. GERD- severe symptoms reported, resume home medications and follow. 9. Essential hypertension- resume home medications.  I do suspect that given high-dose beta-blocker and calcium channel blockers she may have had a syncopal episode secondary to bradycardia which we will monitor on telemetry.  We may have to reduce her beta-blocker or calcium channel blocker if necessary due to bradycardia.  DVT Prophylaxis: Eliquis Code Status: Full   Family Communication: husband at bedside   Disposition Plan: home when medically stabilized    Time spent: 67 mins  Irwin Brakeman, MD [facility] If 7PM-7AM, please contact night-coverage 03/28/2018, 2:44 PM

## 2018-03-28 NOTE — ED Provider Notes (Signed)
[facility] Emergency Department Provider Note MRN:  [id number]  Arrival date & time: 03/28/18     Chief Complaint   Loss of Consciousness   History of Present Illness   Catalea Winegar is a 73 y.o. year-old female with a history of COPD, hypertension, hyperlipidemia, stroke presenting to the ED with chief complaint of syncope.  Last night, patient had a "blackout spell".  Her daughter called her into another room to show her something.  Patient stood from a seated position, was ambulating for several minutes with her daughter, turned and walked out of the room and then she does not remember what happened.  Denies any preceding symptoms warning her of the blackout spell, no dizziness, no chest pain, no numbness or weakness of the arms or legs, no vision change.  Patient estimates she was unconscious for 2 to 3 minutes, patient reports urinary incontinence during this episode of loss of consciousness, woke up and was very briefly groggy, then back to her baseline.  Patient is endorsing pain and soreness to her left foot, left ankle, right knee, neck, she is unsure of head trauma.  She is endorsing trauma to her left nipple, which is moderately tender, worse with palpation.  Review of Systems  A complete 10 system review of systems was obtained and all systems are negative except as noted in the HPI and PMH.   Patient's Health History    Past Medical History:  Diagnosis Date  . Anxiety   . Carpal tunnel syndrome   . COPD (chronic obstructive pulmonary disease) ([place])   . Gastric ulcer   . GERD (gastroesophageal reflux disease)   . Hyperglycemia   . Hyperlipidemia   . Hypertension   . Hypothyroidism   . Osteoarthritis   . Peptic ulcer disease   . Pneumonia 2010  . Renal insufficiency   . 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 Stroke ([place]) 1990s   mild  . TIA (transient ischemic attack)     Past Surgical History:  Procedure Laterality Date  . ABDOMINAL HYSTERECTOMY     . APPENDECTOMY    . BACK SURGERY    . BONE MARROW ASPIRATION Left 04/22/14  . BONE MARROW BIOPSY Left 04/22/14  . CHOLECYSTECTOMY    . COLONOSCOPY     ?2005, [place]  . COLONOSCOPY N/A 04/07/2015   JGG:EZMOQH  . COLONOSCOPY WITH ESOPHAGOGASTRODUODENOSCOPY (EGD)  02/11/2012   RMR: Ulcerative/erosive reflux esophagitis, benign appearing gastric ulcer with unremarkable biopsy, no H pylori. Colonoscopy was unremarkable. Next colonoscopy in 2023  . CYSTECTOMY     cyst removed from rectal area.   . ESOPHAGOGASTRODUODENOSCOPY  01/05/2003   RMR: Normal esophagus, small hiatal hernia/ A couple of tiny antral erosions, otherwise normal stomach normal D1 and D2.   . ESOPHAGOGASTRODUODENOSCOPY N/A 08/11/2014   UTM:LYYTKPT ulcer/HH  . ESOPHAGOGASTRODUODENOSCOPY N/A 12/01/2014   WSF:KCLEXNTZGYFVC improved gastric ulcerations/p bx  . ESOPHAGOGASTRODUODENOSCOPY N/A 01/31/2016   Procedure: ESOPHAGOGASTRODUODENOSCOPY (EGD);  Surgeon: Daneil Dolin, MD;  Location: [facility];  Service: Endoscopy;  Laterality: N/A;  745  . HEMORRHOID SURGERY    . JOINT REPLACEMENT Right    knee  . MALONEY DILATION N/A 01/31/2016   Procedure: Venia Minks DILATION;  Surgeon: Daneil Dolin, MD;  Location: [facility]ITE;  Service: Endoscopy;  Laterality: N/A;    Family History  Problem Relation Age of Onset  . Other Mother        died age 91, natural causes  . Stroke  Mother   . Diabetes Daughter   . Healthy Son   . Healthy Daughter   . Healthy Daughter   . Colon cancer Neg Hx   . Liver disease Neg Hx     Social History   Socioeconomic History  . Marital status: Married    Spouse name: Not on file  . Number of children: 3  . Years of education: Not on file  . Highest education level: Not on file  Occupational History  . Not on file  Social Needs  . Financial resource strain: Not on file  . Food insecurity:    Worry: Not on file    Inability: Not on file  . Transportation needs:    Medical: Not on  file    Non-medical: Not on file  Tobacco Use  . Smoking status: Current Every Day Smoker    Packs/day: 1.50    Years: 58.00    Pack years: 87.00    Types: Cigarettes  . Smokeless tobacco: Never Used  Substance and Sexual Activity  . Alcohol use: No    Alcohol/week: 0.0 standard drinks  . Drug use: No  . Sexual activity: Never  Lifestyle  . Physical activity:    Days per week: Not on file    Minutes per session: Not on file  . Stress: Not on file  Relationships  . Social connections:    Talks on phone: Not on file    Gets together: Not on file    Attends religious service: Not on file    Active member of club or organization: Not on file    Attends meetings of clubs or organizations: Not on file    Relationship status: Not on file  . Intimate partner violence:    Fear of current or ex partner: Not on file    Emotionally abused: Not on file    Physically abused: Not on file    Forced sexual activity: Not on file  Other Topics Concern  . Not on file  Social History Narrative  . Not on file     Physical Exam  Vital Signs and Nursing Notes reviewed Vitals:   03/28/18 1400 03/28/18 1430  BP: 137/65 (!) 141/66  Pulse: 61 60  Resp: 10 12  Temp:    SpO2:      CONSTITUTIONAL: Well-appearing, NAD NEURO:  Alert and oriented x 3, no focal deficits EYES:  eyes equal and reactive ENT/NECK:  no LAD, no JVD CARDIO: Regular rate, well-perfused, normal S1 and S2 PULM:  CTAB no wheezing or rhonchi GI/GU:  normal bowel sounds, non-distended, non-tender MSK/SPINE:  No gross deformities, no edema; tenderness to palpation to the left foot, left ankle, right knee SKIN:  no rash, small hematoma to the left areola of left breast PSYCH:  Appropriate speech and behavior  Diagnostic and Interventional Summary    EKG Interpretation  Date/Time:  Saturday March 28 2018 11:52:10 EST Ventricular Rate:  71 PR Interval:    QRS Duration: 138 QT Interval:  419 QTC Calculation: 456 R  Axis:     Text Interpretation:  Sinus rhythm IVCD, consider atypical LBBB Confirmed by Gerlene Fee [phone]) on 03/28/2018 12:03:19 PM      Labs Reviewed  BASIC METABOLIC PANEL - Abnormal; Notable for the following components:      Result Value   Glucose, Bld 103 (*)    Creatinine, Ser 1.19 (*)    GFR calc non Af Amer 45 (*)    GFR calc Af  Amer 52 (*)    All other components within normal limits  CBC - Abnormal; Notable for the following components:   RBC 3.64 (*)    Hemoglobin 10.7 (*)    HCT 33.8 (*)    All other components within normal limits  URINALYSIS, ROUTINE W REFLEX MICROSCOPIC - Abnormal; Notable for the following components:   APPearance HAZY (*)    Nitrite POSITIVE (*)    Leukocytes, UA SMALL (*)    Bacteria, UA RARE (*)    All other components within normal limits  CBG MONITORING, ED  I-STAT TROPONIN, ED    DG Chest 2 View  Final Result    DG Knee Complete 4 Views Right  Final Result    DG Ankle Complete Left  Final Result    DG Foot Complete Left  Final Result    CT HEAD WO CONTRAST  Final Result    CT CERVICAL SPINE WO CONTRAST  Final Result      Medications  cefTRIAXone (ROCEPHIN) 1 g in sodium chloride 0.9 % 100 mL IVPB (has no administration in time range)  HYDROcodone-acetaminophen (NORCO/VICODIN) 5-325 MG per tablet 1 tablet (1 tablet Oral Given 03/28/18 1434)     Procedures Critical Care  ED Course and Medical Decision Making  I have reviewed the triage vital signs and the nursing notes.  Pertinent labs & imaging results that were available during my care of the patient were reviewed by me and considered in my medical decision making (see below for details).  Concern for cardiac syncope versus seizure in this 73 year old female with syncopal episode, no prodrome.  Also with ground-level fall, anticoagulated, well-appearing with normal vitals here, EKG unchanged with left bundle branch block.  Work-up pending, anticipating admission.   Currently with no neurological deficits.  Labs largely unrevealing, urinalysis with some evidence to suggest infection, given Keflex.  X-rays reveal likely proximal great toe fracture on the left, will provide [place] boot.  CT imaging unremarkable.  Admitted to hospitalist service for further care and evaluation of high risk syncope versus less likely seizure.  Barth Kirks. Sedonia Small, [place] [email or web address]  Final Clinical Impressions(s) / ED Diagnoses     ICD-10-CM   1. Closed nondisplaced fracture of proximal phalanx of left great toe, initial encounter S92.415A   2. Syncope R55 DG Chest 2 View    DG Chest 2 View    ED Discharge Orders    None         Maudie Flakes, MD 03/28/18 1527

## 2018-03-28 NOTE — ED Notes (Signed)
Pt cannot provide urine sample at this time. Will notify staff when able.

## 2018-03-28 NOTE — ED Triage Notes (Signed)
Patient complains of left breast pain and bruising after syncopal episode last Sunday night. Patient states she is unsure of what happened, but she blacked out and woke up on the floor. Denies having previous syncopal episodes.

## 2018-03-28 NOTE — ED Notes (Signed)
Left nipple noted to be red and swollen. Tender to touch

## 2018-03-29 ENCOUNTER — Observation Stay (HOSPITAL_BASED_OUTPATIENT_CLINIC_OR_DEPARTMENT_OTHER): Payer: Medicare Other

## 2018-03-29 DIAGNOSIS — R55 Syncope and collapse: Secondary | ICD-10-CM | POA: Diagnosis not present

## 2018-03-29 DIAGNOSIS — N183 Chronic kidney disease, stage 3 (moderate): Secondary | ICD-10-CM | POA: Diagnosis not present

## 2018-03-29 DIAGNOSIS — J449 Chronic obstructive pulmonary disease, unspecified: Secondary | ICD-10-CM | POA: Diagnosis not present

## 2018-03-29 DIAGNOSIS — I10 Essential (primary) hypertension: Secondary | ICD-10-CM | POA: Diagnosis not present

## 2018-03-29 LAB — GLUCOSE, CAPILLARY: Glucose-Capillary: 92 mg/dL (ref 70–99)

## 2018-03-29 LAB — CBC WITH DIFFERENTIAL/PLATELET
Abs Immature Granulocytes: 0.02 10*3/uL (ref 0.00–0.07)
Basophils Absolute: 0 10*3/uL (ref 0.0–0.1)
Basophils Relative: 1 %
Eosinophils Absolute: 0.7 10*3/uL — ABNORMAL HIGH (ref 0.0–0.5)
Eosinophils Relative: 13 %
HCT: 32.3 % — ABNORMAL LOW (ref 36.0–46.0)
HEMOGLOBIN: 9.9 g/dL — AB (ref 12.0–15.0)
Immature Granulocytes: 0 %
Lymphocytes Relative: 26 %
Lymphs Abs: 1.3 10*3/uL (ref 0.7–4.0)
MCH: 28.9 pg (ref 26.0–34.0)
MCHC: 30.7 g/dL (ref 30.0–36.0)
MCV: 94.4 fL (ref 80.0–100.0)
Monocytes Absolute: 0.6 10*3/uL (ref 0.1–1.0)
Monocytes Relative: 11 %
Neutro Abs: 2.5 10*3/uL (ref 1.7–7.7)
Neutrophils Relative %: 49 %
Platelets: 173 10*3/uL (ref 150–400)
RBC: 3.42 MIL/uL — AB (ref 3.87–5.11)
RDW: 12.9 % (ref 11.5–15.5)
WBC: 5.1 10*3/uL (ref 4.0–10.5)
nRBC: 0 % (ref 0.0–0.2)

## 2018-03-29 LAB — HEMOGLOBIN A1C
HEMOGLOBIN A1C: 5.5 % (ref 4.8–5.6)
Mean Plasma Glucose: 111.15 mg/dL

## 2018-03-29 LAB — COMPREHENSIVE METABOLIC PANEL
ALT: 10 U/L (ref 0–44)
AST: 11 U/L — ABNORMAL LOW (ref 15–41)
Albumin: 3 g/dL — ABNORMAL LOW (ref 3.5–5.0)
Alkaline Phosphatase: 67 U/L (ref 38–126)
Anion gap: 5 (ref 5–15)
BUN: 15 mg/dL (ref 8–23)
CO2: 28 mmol/L (ref 22–32)
Calcium: 9.3 mg/dL (ref 8.9–10.3)
Chloride: 106 mmol/L (ref 98–111)
Creatinine, Ser: 0.98 mg/dL (ref 0.44–1.00)
GFR calc Af Amer: >60 mL/min (ref >60)
GFR calc non Af Amer: 57 mL/min — ABNORMAL LOW (ref >60)
Glucose, Bld: 93 mg/dL (ref 70–99)
Potassium: 3.9 mmol/L (ref 3.5–5.1)
Sodium: 139 mmol/L (ref 135–145)
Total Bilirubin: 0.3 mg/dL (ref 0.3–1.2)
Total Protein: 6 g/dL — ABNORMAL LOW (ref 6.5–8.1)

## 2018-03-29 LAB — ECHOCARDIOGRAM COMPLETE
Height: 64 in
Weight: 3089.97 oz

## 2018-03-29 LAB — MAGNESIUM: Magnesium: 1.9 mg/dL (ref 1.7–2.4)

## 2018-03-29 MED ORDER — LEVOTHYROXINE SODIUM 75 MCG PO TABS
150.0000 ug | ORAL_TABLET | Freq: Every day | ORAL | Status: DC
  Administered 2018-03-30 – 2018-03-31 (×2): 150 ug via ORAL
  Filled 2018-03-29 (×2): qty 2

## 2018-03-29 NOTE — Progress Notes (Signed)
PROGRESS NOTE    Connie Ponce  VAN:[id number]  DOB: May 09, 1945  DOA: 03/28/2018 PCP: Erasmo Downer, NP   Brief Admission Hx: 73 y.o. female with complex past medical history including CVA, hypertension, hyperlipidemia, COPD with ongoing tobacco abuse, who presented to the emergency department after reporting a loss of consciousness that she describes as a "blackout spell ".     MDM/Assessment & Plan:    1. Syncope and collapse with loss of consciousness-patient was down for approximately 3 to 4 minutes.  She had some urinary incontinence.  She does not have a history of seizure but does have known cerebrovascular disease status post CVA.  Admitted for observation and further evaluation with neuro checks, orthostatics vital signs were negative, continue telemetry monitoring, awaiting 2D echocardiogram, PT/OT evaluation, EEG testing, fall precautions and seizure precautions recommended.  Follow electrolytes closely.  TSH elevated at 11. 2. UTI- obtain urine culture.  Continue ceftriaxone IV. 3. Dehydration-hold diuretics temporarily, hydrate gently with IV fluids.  Monitor electrolytes. 4. Anemia of CKD- follow hemoglobin closely, Hemoccult stool x3.  Obtain anemia panel. 5. Stage III CKD- gently hydrating with IV fluids, monitor BMP.  Avoid nephrotoxins as possible.  Renally dose medications. 6. Hypothyroidism - pt says that she has been faithfully taking her thyroid medication. Her TSH is elevated at 11.  Will increase dose of levothyroxine to 150 mcg.  7. Hyperlipidemia-resume home statin medication. 8. Tobacco abuse-patient strongly advised to discontinue all tobacco use.  Will provide a nicotine patch for cravings. 9. GERD- severe symptoms reported, resume home medications and follow. 10. Essential hypertension- resume home medications. No bradycardia seen on telemetry.   DVT Prophylaxis: Eliquis Code Status: Full   Family Communication: husband at bedside   Disposition Plan:  home when medically stabilized     Consultants:    Procedures:    Antimicrobials:    Subjective: Pt says no further black out spells.  No other complaints.   Objective: Vitals:   03/29/18 0023 03/29/18 0406 03/29/18 0748 03/29/18 0900  BP: (!) 139/53 (!) 135/59  (!) 135/54  Pulse: 75 70  71  Resp: 16 16  16   Temp: 98.1 F (36.7 C) 98.6 F (37 C)  98.7 F (37.1 C)  TempSrc: Oral Oral  Oral  SpO2: 95% 95% 94% 95%  Weight:  87.6 kg    Height:        Intake/Output Summary (Last 24 hours) at 03/29/2018 1214 Last data filed at 03/29/2018 0700 Gross per 24 hour  Intake 1388.51 ml  Output -  Net 1388.51 ml   Filed Weights   03/28/18 1128 03/29/18 0406  Weight: 89.8 kg 87.6 kg     REVIEW OF SYSTEMS  As per history otherwise all reviewed and reported negative  Exam:  General exam: awake, alert, NAD. Cooperative.  Respiratory system: Clear. No increased work of breathing. Cardiovascular system: S1 & S2 heard, RRR. No JVD, murmurs, gallops, clicks or pedal edema. Gastrointestinal system: Abdomen is nondistended, soft and nontender. Normal bowel sounds heard. Central nervous system: Alert and oriented. No focal neurological deficits. Extremities: no CCE.  Data Reviewed: Basic Metabolic Panel: Recent Labs  Lab 03/28/18 1142 03/29/18 0649  NA 135 139  K 3.9 3.9  CL 101 106  CO2 25 28  GLUCOSE 103* 93  BUN 19 15  CREATININE 1.19* 0.98  CALCIUM 9.6 9.3  MG  --  1.9   Liver Function Tests: Recent Labs  Lab 03/29/18 0649  AST 11*  ALT 10  ALKPHOS 67  BILITOT 0.3  PROT 6.0*  ALBUMIN 3.0*   No results for input(s): LIPASE, AMYLASE in the last 168 hours. No results for input(s): AMMONIA in the last 168 hours. CBC: Recent Labs  Lab 03/28/18 1142 03/29/18 0649  WBC 6.0 5.1  NEUTROABS  --  2.5  HGB 10.7* 9.9*  HCT 33.8* 32.3*  MCV 92.9 94.4  PLT 243 173   Cardiac Enzymes: No results for input(s): CKTOTAL, CKMB, CKMBINDEX, TROPONINI in the  last 168 hours. CBG (last 3)  Recent Labs    03/28/18 1147 03/29/18 0448  GLUCAP 99 92   No results found for this or any previous visit (from the past 240 hour(s)).   Studies: Dg Chest 2 View  Result Date: 03/28/2018 CLINICAL DATA:  Chest pain after fall. EXAM: CHEST - 2 VIEW COMPARISON:  Radiographs of October 03, 2017. FINDINGS: The heart size and mediastinal contours are within normal limits. Both lungs are clear. No pneumothorax or pleural effusion is noted. The visualized skeletal structures are unremarkable. IMPRESSION: No active cardiopulmonary disease. Electronically Signed   By: Lupita RaiderJames  Green Jr, M.D.   On: 03/28/2018 13:42   Dg Ankle Complete Left  Result Date: 03/28/2018 CLINICAL DATA:  Left ankle pain after fall. EXAM: LEFT ANKLE COMPLETE - 3+ VIEW COMPARISON:  None. FINDINGS: There is no evidence of fracture, dislocation, or joint effusion. There is no evidence of arthropathy or other focal bone abnormality. Soft tissues are unremarkable. IMPRESSION: Negative. Electronically Signed   By: Lupita RaiderJames  Green Jr, M.D.   On: 03/28/2018 13:44   Ct Head Wo Contrast  Result Date: 03/28/2018 CLINICAL DATA:  Syncopal episode last night. Patient denies headache or neck pain. EXAM: CT HEAD WITHOUT CONTRAST CT CERVICAL SPINE WITHOUT CONTRAST TECHNIQUE: Multidetector CT imaging of the head and cervical spine was performed following the standard protocol without intravenous contrast. Multiplanar CT image reconstructions of the cervical spine were also generated. COMPARISON:  CT neck dated February 20, 2018. CT head dated October 03, 2017. MRI cervical spine dated January 03, 2016. FINDINGS: CT HEAD FINDINGS Brain: No evidence of acute infarction, hemorrhage, hydrocephalus, extra-axial collection or mass lesion/mass effect. Unchanged old lacunar infarct in the left basal ganglia. Vascular: Atherosclerotic vascular calcification of the carotid siphons. No hyperdense vessel. Skull: Unchanged diffuse calvarial  hyperostosis. Negative for fracture or focal lesion. Sinuses/Orbits: No acute finding. Other: None. CT CERVICAL SPINE FINDINGS Alignment: Normal. Skull base and vertebrae: No acute fracture. No primary bone lesion or focal pathologic process. Soft tissues and spinal canal: No prevertebral fluid or swelling. No visible canal hematoma. Disc levels: Disc spaces are relatively preserved. Large bulky ventral osteophytes throughout the cervical spine. Scattered mild facet arthropathy. Upper chest: Mild centrilobular emphysema. Other: Unchanged asymmetric thickening of the right aryepiglottic fold. IMPRESSION: 1.  No acute intracranial abnormality. 2.  No acute cervical spine fracture. 3. Unchanged asymmetric thickening of the right aryepiglottic fold. Correlate with direct visualization. 4.  Emphysema (ICD10-J43.9). Electronically Signed   By: Obie DredgeWilliam T Derry M.D.   On: 03/28/2018 13:39   Ct Cervical Spine Wo Contrast  Result Date: 03/28/2018 CLINICAL DATA:  Syncopal episode last night. Patient denies headache or neck pain. EXAM: CT HEAD WITHOUT CONTRAST CT CERVICAL SPINE WITHOUT CONTRAST TECHNIQUE: Multidetector CT imaging of the head and cervical spine was performed following the standard protocol without intravenous contrast. Multiplanar CT image reconstructions of the cervical spine were also generated. COMPARISON:  CT neck dated February 20, 2018. CT head dated October 03, 2017.  MRI cervical spine dated January 03, 2016. FINDINGS: CT HEAD FINDINGS Brain: No evidence of acute infarction, hemorrhage, hydrocephalus, extra-axial collection or mass lesion/mass effect. Unchanged old lacunar infarct in the left basal ganglia. Vascular: Atherosclerotic vascular calcification of the carotid siphons. No hyperdense vessel. Skull: Unchanged diffuse calvarial hyperostosis. Negative for fracture or focal lesion. Sinuses/Orbits: No acute finding. Other: None. CT CERVICAL SPINE FINDINGS Alignment: Normal. Skull base and vertebrae:  No acute fracture. No primary bone lesion or focal pathologic process. Soft tissues and spinal canal: No prevertebral fluid or swelling. No visible canal hematoma. Disc levels: Disc spaces are relatively preserved. Large bulky ventral osteophytes throughout the cervical spine. Scattered mild facet arthropathy. Upper chest: Mild centrilobular emphysema. Other: Unchanged asymmetric thickening of the right aryepiglottic fold. IMPRESSION: 1.  No acute intracranial abnormality. 2.  No acute cervical spine fracture. 3. Unchanged asymmetric thickening of the right aryepiglottic fold. Correlate with direct visualization. 4.  Emphysema (ICD10-J43.9). Electronically Signed   By: Obie Dredge M.D.   On: 03/28/2018 13:39   Dg Knee Complete 4 Views Right  Result Date: 03/28/2018 CLINICAL DATA:  Right knee pain after fall. EXAM: RIGHT KNEE - COMPLETE 4+ VIEW COMPARISON:  None. FINDINGS: Status post right total knee arthroplasty. The femoral and tibial components appear to be well situated. No fracture or dislocation is noted. No joint effusion is noted. No soft tissue abnormality is noted. IMPRESSION: No acute abnormality seen in the right knee. Electronically Signed   By: Lupita Raider, M.D.   On: 03/28/2018 13:43   Dg Foot Complete Left  Result Date: 03/28/2018 CLINICAL DATA:  Left foot pain after fall. EXAM: LEFT FOOT - COMPLETE 3+ VIEW COMPARISON:  None. FINDINGS: Possible nondisplaced fracture is seen involving proximal base of first proximal phalanx. No dislocation is noted. Moderate degenerative changes seen involving the first metatarsophalangeal joint. Soft tissues are unremarkable. IMPRESSION: Moderate osteoarthritis of first metatarsophalangeal joint. Possible nondisplaced fracture involving proximal base of first proximal phalanx. Electronically Signed   By: Lupita Raider, M.D.   On: 03/28/2018 13:48   Scheduled Meds: . allopurinol  100 mg Oral Daily  . apixaban  5 mg Oral BID  . aspirin EC  162 mg  Oral Daily  . carvedilol  12.5 mg Oral BID WC  . diltiazem  360 mg Oral Daily  . famotidine  20 mg Oral QHS  . gabapentin  300 mg Oral TID  . levothyroxine  125 mcg Oral Q0600  . mometasone-formoterol  2 puff Inhalation BID  . nicotine  21 mg Transdermal Daily  . pantoprazole  40 mg Oral Daily  . pravastatin  40 mg Oral q1800  . sertraline  50 mg Oral Daily  . sodium chloride flush  3 mL Intravenous Q12H  . tiotropium  18 mcg Inhalation Daily  . traZODone  150 mg Oral QHS   Continuous Infusions: . sodium chloride 50 mL/hr at 03/29/18 0526  . cefTRIAXone (ROCEPHIN)  IV Stopped (03/28/18 1602)    Principal Problem:   Syncope and collapse Active Problems:   GERD (gastroesophageal reflux disease)   UTI (urinary tract infection)   Essential hypertension   COPD (chronic obstructive pulmonary disease) ([facility])   Tobacco abuse   Fall at home   CKD (chronic kidney disease) stage 3, GFR 30-59 ml/min ([facility])   Anemia in chronic kidney disease (CKD)   Hyperlipidemia  Time spent:   Standley Dakins, MD, FAAFP [facility]  03/29/2018, 12:14 PM    LOS: 0 days

## 2018-03-29 NOTE — Progress Notes (Signed)
*  PRELIMINARY RESULTS* Echocardiogram 2D Echocardiogram has been performed.  Jeryl Columbia 03/29/2018, 12:34 PM

## 2018-03-30 ENCOUNTER — Observation Stay (HOSPITAL_COMMUNITY)
Admit: 2018-03-30 | Discharge: 2018-03-30 | Disposition: A | Discharge status: Home / Self Care | Payer: Medicare Other | Attending: Family Medicine | Admitting: Family Medicine

## 2018-03-30 DIAGNOSIS — N183 Chronic kidney disease, stage 3 (moderate): Secondary | ICD-10-CM | POA: Diagnosis not present

## 2018-03-30 DIAGNOSIS — R55 Syncope and collapse: Secondary | ICD-10-CM | POA: Diagnosis not present

## 2018-03-30 DIAGNOSIS — I10 Essential (primary) hypertension: Secondary | ICD-10-CM | POA: Diagnosis not present

## 2018-03-30 DIAGNOSIS — J449 Chronic obstructive pulmonary disease, unspecified: Secondary | ICD-10-CM | POA: Diagnosis not present

## 2018-03-30 LAB — COMPREHENSIVE METABOLIC PANEL
ALBUMIN: 2.9 g/dL — AB (ref 3.5–5.0)
ALK PHOS: 64 U/L (ref 38–126)
ALT: 9 U/L (ref 0–44)
AST: 9 U/L — ABNORMAL LOW (ref 15–41)
Anion gap: 5 (ref 5–15)
BUN: 11 mg/dL (ref 8–23)
CO2: 27 mmol/L (ref 22–32)
Calcium: 9.4 mg/dL (ref 8.9–10.3)
Chloride: 108 mmol/L (ref 98–111)
Creatinine, Ser: 0.95 mg/dL (ref 0.44–1.00)
GFR calc Af Amer: >60 mL/min (ref >60)
GFR calc non Af Amer: 59 mL/min — ABNORMAL LOW (ref >60)
Glucose, Bld: 93 mg/dL (ref 70–99)
Potassium: 3.7 mmol/L (ref 3.5–5.1)
Sodium: 140 mmol/L (ref 135–145)
TOTAL PROTEIN: 5.9 g/dL — AB (ref 6.5–8.1)
Total Bilirubin: 0.3 mg/dL (ref 0.3–1.2)

## 2018-03-30 LAB — GLUCOSE, CAPILLARY: Glucose-Capillary: 105 mg/dL — ABNORMAL HIGH (ref 70–99)

## 2018-03-30 LAB — CBC WITH DIFFERENTIAL/PLATELET
Abs Immature Granulocytes: 0.02 10*3/uL (ref 0.00–0.07)
Basophils Absolute: 0.1 10*3/uL (ref 0.0–0.1)
Basophils Relative: 1 %
Eosinophils Absolute: 0.6 10*3/uL — ABNORMAL HIGH (ref 0.0–0.5)
Eosinophils Relative: 12 %
HCT: 30.5 % — ABNORMAL LOW (ref 36.0–46.0)
Hemoglobin: 9.7 g/dL — ABNORMAL LOW (ref 12.0–15.0)
Immature Granulocytes: 0 %
Lymphocytes Relative: 29 %
Lymphs Abs: 1.5 10*3/uL (ref 0.7–4.0)
MCH: 30.2 pg (ref 26.0–34.0)
MCHC: 31.8 g/dL (ref 30.0–36.0)
MCV: 95 fL (ref 80.0–100.0)
Monocytes Absolute: 0.5 10*3/uL (ref 0.1–1.0)
Monocytes Relative: 10 %
Neutro Abs: 2.6 10*3/uL (ref 1.7–7.7)
Neutrophils Relative %: 48 %
Platelets: 195 10*3/uL (ref 150–400)
RBC: 3.21 MIL/uL — ABNORMAL LOW (ref 3.87–5.11)
RDW: 12.9 % (ref 11.5–15.5)
WBC: 5.4 10*3/uL (ref 4.0–10.5)
nRBC: 0 % (ref 0.0–0.2)

## 2018-03-30 LAB — MAGNESIUM: Magnesium: 2 mg/dL (ref 1.7–2.4)

## 2018-03-30 NOTE — Evaluation (Signed)
Occupational Therapy Evaluation Patient Details Name: Connie Ponce MRN: [id number] DOB: [birth year] Today's Date: 03/30/2018    History of Present Illness Connie Ponce is a 73 y.o. female with complex past medical history including CVA, hypertension, hyperlipidemia, COPD with ongoing tobacco abuse, who presented to the emergency department after reporting a loss of consciousness that happened last night that she describes as a "blackout spell ".  The patient reports that her daughter called her into another room to see something and the patient stood up from a seated position and apparently passed out for approximately 3 to 4 minutes.  The patient does not remember exactly what happened.  The patient says she did not have any prior chest pain symptoms or shortness of breath.  She does not have a history of syncope or seizures.  The patient denies dizziness.    Clinical Impression   Pt agreeable to participate in OT evaluation. Pt declined to transfer out of bed at this time due to headache and just receiving breakfast tray. Pt verbalizes no concerns with returning home as she states she has the adequate amount of support from her husband and daughter. Pt reports that she does need a BSC and shower chair at home. No follow up OT services needed at this time. Thank you for the referral.    Follow Up Recommendations  No OT follow up    Equipment Recommendations  3 in 1 bedside commode;Tub/shower seat       Precautions / Restrictions Precautions Precautions: Fall Precaution Comments: Due to recent syncope episode Restrictions Weight Bearing Restrictions: No             ADL either performed or assessed with clinical judgement   ADL       General ADL Comments: Pt just received breakfast tray and declined getting out of bed into recliner at this time due to headache. Patient was able to open all containers on tray. Pt reports that she has been up walking to the bathroom without  assistance.      Vision Baseline Vision/History: No visual deficits Patient Visual Report: No change from baseline              Pertinent Vitals/Pain Pain Assessment: 0-10 Pain Score: 9  Pain Location: headache Pain Descriptors / Indicators: Headache Pain Intervention(s): Patient requesting pain meds-RN notified     Hand Dominance Right   Extremity/Trunk Assessment Upper Extremity Assessment Upper Extremity Assessment: Overall WFL for tasks assessed   Lower Extremity Assessment Lower Extremity Assessment: Defer to PT evaluation       Communication Communication Communication: No difficulties   Cognition Arousal/Alertness: Awake/alert Behavior During Therapy: WFL for tasks assessed/performed Overall Cognitive Status: Within Functional Limits for tasks assessed                  Home Living Family/patient expects to be discharged to:: Private residence Living Arrangements: Spouse/significant other Available Help at Discharge: Family;Available PRN/intermittently Type of Home: Mobile home Home Access: Stairs to enter Entrance Stairs-Number of Steps: 3 steps   Home Layout: One level     Bathroom Shower/Tub: Chief Strategy Officer: Standard     Home Equipment: Environmental consultant - 2 wheels   Additional Comments: Pt reports that her husband works 2nd shift. Her daughter works 1st shift at [employer] and will come over after her shift to assist with her dinner and get ready for the end of the day with bathing and dressing.       Prior  Functioning/Environment Level of Independence: Independent                          OT Goals(Current goals can be found in the care plan section) Acute Rehab OT Goals Patient Stated Goal: To go home. OT Goal Formulation: With patient   AM-PAC OT "6 Clicks" Daily Activity     Outcome Measure Help from another person eating meals?: None Help from another person taking care of personal grooming?: None Help from  another person toileting, which includes using toliet, bedpan, or urinal?: None Help from another person bathing (including washing, rinsing, drying)?: A Little Help from another person to put on and taking off regular upper body clothing?: None Help from another person to put on and taking off regular lower body clothing?: A Little 6 Click Score: 22   End of Session    Activity Tolerance: Patient tolerated treatment well;Patient limited by pain Patient left: in bed;with call bell/phone within reach;with bed alarm set  OT Visit Diagnosis: Muscle weakness (generalized) (M62.81)                Time: 0258-5277 OT Time Calculation (min): 11 min Charges:  OT General Charges $OT Visit: 1 Visit OT Evaluation $OT Eval Low Complexity: 1 Low  Limmie Patricia, OTR/L,CBIS  [phone]   Kimorah Ridolfi, Charisse March 03/30/2018, 8:45 AM

## 2018-03-30 NOTE — Progress Notes (Signed)
EEG completed, results pending. 

## 2018-03-30 NOTE — Care Management Note (Signed)
Case Management Note  Patient Details  Name: Connie Ponce MRN: [id number] Date of Birth: Jul 29, 1945  Subjective/Objective:         From home with syncope and collapse. Pt has insurance and PCP. Pt lives with husband and daughter, they work alternating shifts so pt has someone with her 24/7. Pt declines HH referral at this time but does request BSC and shower stool.            Action/Plan: Provider choice given and pt requests [facility]. Bonita Quin, [facility] rep given DME referral and will deliver DME prior to DC.   Expected Discharge Date:    03/30/2018              Expected Discharge Plan:  Home/Self Care  In-House Referral:  NA  Discharge planning Services  CM Consult  Post Acute Care Choice:  Durable Medical Equipment Choice offered to:  Patient  DME Arranged:  3-N-1, Shower stool DME Agency:  [facility]  HH Arranged:  Patient Refused [facility] Agency:     Status of Service:  Completed, signed off  Malcolm Metro, RN 03/30/2018, 2:36 PM

## 2018-03-30 NOTE — Progress Notes (Signed)
PROGRESS NOTE    Loreatha Bacco  TWK:[id number]  DOB: [birth year]  DOA: 03/28/2018 PCP: Erasmo Downer, NP   Brief Admission Hx: 73 y.o. female with complex past medical history including CVA, hypertension, hyperlipidemia, COPD with ongoing tobacco abuse, who presented to the emergency department after reporting a loss of consciousness that she describes as a "blackout spell ".     MDM/Assessment & Plan:    1. Syncope and collapse with loss of consciousness-patient was down for approximately 3 to 4 minutes.  She had some urinary incontinence.  She does not have a history of seizure but does have known cerebrovascular disease status post CVA.  Admitted for observation and further evaluation with neuro checks, orthostatics vital signs were negative, continue telemetry monitoring, awaiting 2D echocardiogram, PT/OT evaluation, EEG testing completed but no epileptiform activity seen, fall precautions and seizure precautions recommended.  Follow electrolytes closely.  TSH elevated at 11 which may be contributing.  DC home in AM.  2. E coli UTI-  Continue ceftriaxone IV for full 3 doses. 3. Dehydration-hold diuretics temporarily, hydrate gently with IV fluids.  Monitor electrolytes. 4. Anemia of CKD- follow hemoglobin closely, Hemoccult stool x3.  Obtain anemia panel. 5. Stage III CKD- gently hydrating with IV fluids, monitor BMP.  Avoid nephrotoxins as possible.  Renally dose medications. 6. Hypothyroidism - pt says that she has been faithfully taking her thyroid medication. Her TSH is elevated at 11.  Will increase dose of levothyroxine to 150 mcg.  7. Hyperlipidemia-resume home statin medication. 8. Tobacco abuse-patient strongly advised to discontinue all tobacco use.  Will provide a nicotine patch for cravings. 9. GERD- severe symptoms reported, resume home medications and follow. 10. Essential hypertension- resume home medications. No bradycardia seen on telemetry.   DVT Prophylaxis:  Eliquis Code Status: Full   Family Communication: husband at bedside   Disposition Plan: DC home in AM    Consultants:    Procedures:  EEG  Echocardiogram   Study Conclusions  - Procedure narrative: Image quality was poor. - Left ventricle: The cavity size was normal. Systolic function was vigorous. The estimated ejection fraction was in the range of 65%   to 70%. Although no diagnostic regional wall motion abnormality was identified, this possibility cannot be completely excluded on   the basis of this study. Doppler parameters are consistent with abnormal left ventricular relaxation (grade 1 diastolic dysfunction). - Ventricular septum: Abnormal septal motion secondary to conduction delay. - Aortic valve: Transvalvular velocity was within the normal range.   There was no stenosis. There was no regurgitation. Mean gradient (S): 3 mm Hg. - Mitral valve: Calcified annulus. Mildly thickened leaflets.  There was trivial regurgitation. - Left atrium: The atrium was normal in size. - Right ventricle: Poorly visualized. The cavity size was normal.  Systolic function was mildly reduced. - Right atrium: The atrium was normal in size. Central venous   pressure (est): 10 mm Hg. - Inferior vena cava: The vessel was normal in size. The   respirophasic diameter changes were blunted (< 50%). - Pericardium, extracardiac: A trivial pericardial effusion was   identified.  Antimicrobials:  Ceftriaxone 1/11 >  Subjective: Pt says no further black out spells. Denies seizure or incontinence.  No other complaints. No CP, no SOB.  Objective: Vitals:   03/30/18 0624 03/30/18 0742 03/30/18 0838 03/30/18 1335  BP: (!) 149/68   135/66  Pulse: 63  68 61  Resp: 16   17  Temp: 98.1 F (36.7 C)  97.9 F (36.6 C)  TempSrc: Oral   Oral  SpO2: 96% 94%  99%  Weight:      Height:        Intake/Output Summary (Last 24 hours) at 03/30/2018 1613 Last data filed at 03/30/2018 1258 Gross per 24  hour  Intake 1879.34 ml  Output 525 ml  Net 1354.34 ml   Filed Weights   03/28/18 1128 03/29/18 0406 03/30/18 0500  Weight: 89.8 kg 87.6 kg 88.3 kg   REVIEW OF SYSTEMS  As per history otherwise all reviewed and reported negative  Exam:  General exam: awake, alert, NAD. Cooperative.  Respiratory system: Clear. No increased work of breathing. Cardiovascular system: S1 & S2 heard, RRR. No JVD, murmurs, gallops, clicks or pedal edema. Gastrointestinal system: Abdomen is nondistended, soft and nontender. Normal bowel sounds heard. Central nervous system: Alert and oriented. No focal neurological deficits. Extremities: no CCE.  Data Reviewed: Basic Metabolic Panel: Recent Labs  Lab 03/28/18 1142 03/29/18 0649 03/30/18 0520  NA 135 139 140  K 3.9 3.9 3.7  CL 101 106 108  CO2 25 28 27   GLUCOSE 103* 93 93  BUN 19 15 11   CREATININE 1.19* 0.98 0.95  CALCIUM 9.6 9.3 9.4  MG  --  1.9 2.0   Liver Function Tests: Recent Labs  Lab 03/29/18 0649 03/30/18 0520  AST 11* 9*  ALT 10 9  ALKPHOS 67 64  BILITOT 0.3 0.3  PROT 6.0* 5.9*  ALBUMIN 3.0* 2.9*   No results for input(s): LIPASE, AMYLASE in the last 168 hours. No results for input(s): AMMONIA in the last 168 hours. CBC: Recent Labs  Lab 03/28/18 1142 03/29/18 0649 03/30/18 0520  WBC 6.0 5.1 5.4  NEUTROABS  --  2.5 2.6  HGB 10.7* 9.9* 9.7*  HCT 33.8* 32.3* 30.5*  MCV 92.9 94.4 95.0  PLT 243 173 195   Cardiac Enzymes: No results for input(s): CKTOTAL, CKMB, CKMBINDEX, TROPONINI in the last 168 hours. CBG (last 3)  Recent Labs    03/28/18 1147 03/29/18 0448 03/30/18 0508  GLUCAP 99 92 105*   Recent Results (from the past 240 hour(s))  Urine culture     Status: Abnormal (Preliminary result)   Collection Time: 03/28/18 12:52 PM  Result Value Ref Range Status   Specimen Description   Final    URINE, CLEAN CATCH Performed at [facility], [address], [place], [place] [address]    Special Requests    Final    NONE Performed at [facility], [address], [place], [place] [address]    Culture (A)  Final    >=100,000 COLONIES/mL ESCHERICHIA COLI SUSCEPTIBILITIES TO FOLLOW Performed at [facility], [address], [place], [place] [address]    Report Status PENDING  Incomplete     Studies: No results found. Scheduled Meds: . allopurinol  100 mg Oral Daily  . apixaban  5 mg Oral BID  . aspirin EC  162 mg Oral Daily  . carvedilol  12.5 mg Oral BID WC  . diltiazem  360 mg Oral Daily  . famotidine  20 mg Oral QHS  . gabapentin  300 mg Oral TID  . levothyroxine  150 mcg Oral Q0600  . mometasone-formoterol  2 puff Inhalation BID  . nicotine  21 mg Transdermal Daily  . pantoprazole  40 mg Oral Daily  . pravastatin  40 mg Oral q1800  . sertraline  50 mg Oral Daily  . sodium chloride flush  3 mL  Intravenous Q12H  . tiotropium  18 mcg Inhalation Daily  . traZODone  150 mg Oral QHS   Continuous Infusions: . sodium chloride 50 mL/hr at 03/30/18 1542  . cefTRIAXone (ROCEPHIN)  IV 1 g (03/30/18 1545)    Principal Problem:   Syncope and collapse Active Problems:   GERD (gastroesophageal reflux disease)   UTI (urinary tract infection)   Essential hypertension   COPD (chronic obstructive pulmonary disease) (HCC)   Tobacco abuse   Fall at home   CKD (chronic kidney disease) stage 3, GFR 30-59 ml/min (HCC)   Anemia in chronic kidney disease (CKD)   Hyperlipidemia  Time spent:   Standley Dakinslanford Armoni Depass, MD [facility]  03/30/2018, 4:13 PM    LOS: 0 days

## 2018-03-30 NOTE — Evaluation (Signed)
Clinical/Bedside Swallow Evaluation Patient Details  Name: Connie Ponce MRN: [id number] Date of Birth: 08/27/1945  Today's Date: 03/30/2018 Time: SLP Start Time (ACUTE ONLY): 50 SLP Stop Time (ACUTE ONLY): 1519 SLP Time Calculation (min) (ACUTE ONLY): 15 min  Past Medical History:  Past Medical History:  Diagnosis Date  . Anxiety   . Carpal tunnel syndrome   . COPD (chronic obstructive pulmonary disease) ([place])   . Gastric ulcer   . GERD (gastroesophageal reflux disease)   . Hyperglycemia   . Hyperlipidemia   . Hypertension   . Hypothyroidism   . Osteoarthritis   . Peptic ulcer disease   . Pneumonia 2010  . Renal insufficiency   . 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 Stroke ([place]) 1990s   mild  . TIA (transient ischemic attack)    Past Surgical History:  Past Surgical History:  Procedure Laterality Date  . ABDOMINAL HYSTERECTOMY    . APPENDECTOMY    . BACK SURGERY    . BONE MARROW ASPIRATION Left 04/22/14  . BONE MARROW BIOPSY Left 04/22/14  . CHOLECYSTECTOMY    . COLONOSCOPY     ?2005, [place]  . COLONOSCOPY N/A 04/07/2015   MWN:UUVOZD  . COLONOSCOPY WITH ESOPHAGOGASTRODUODENOSCOPY (EGD)  02/11/2012   RMR: Ulcerative/erosive reflux esophagitis, benign appearing gastric ulcer with unremarkable biopsy, no H pylori. Colonoscopy was unremarkable. Next colonoscopy in 2023  . CYSTECTOMY     cyst removed from rectal area.   . ESOPHAGOGASTRODUODENOSCOPY  01/05/2003   RMR: Normal esophagus, small hiatal hernia/ A couple of tiny antral erosions, otherwise normal stomach normal D1 and D2.   . ESOPHAGOGASTRODUODENOSCOPY N/A 08/11/2014   GUY:QIHKVQQ ulcer/HH  . ESOPHAGOGASTRODUODENOSCOPY N/A 12/01/2014   VZD:[id number] improved gastric ulcerations/p bx  . ESOPHAGOGASTRODUODENOSCOPY N/A 01/31/2016   Procedure: ESOPHAGOGASTRODUODENOSCOPY (EGD);  Surgeon: Daneil Dolin, MD;  [facility];  Service: Endoscopy;  Laterality: N/A;  745  . HEMORRHOID SURGERY    .  JOINT REPLACEMENT Right    knee  . MALONEY DILATION N/A 01/31/2016   Procedure: Venia Minks DILATION;  Surgeon: Daneil Dolin, MD;  Location: [facility];  Service: Endoscopy;  Laterality: N/A;   HPI:  73 y.o. female with complex past medical history including CVA, hypertension, hyperlipidemia, COPD with ongoing tobacco abuse, who presented to the emergency department after reporting a loss of consciousness that she describes as a "blackout spell ".      Assessment / Plan / Recommendation Clinical Impression  Clinical swallowing evaluation completed with pt sitting upright on the edge of bed. Pt with known esophageal dysphagia; no overt s/sx of oropharyngeal dysphagia were noted during evaluation. Pt consumed regular textures, puree and thin liquids without incident. SLP reviewed universal aspiration precautions. Recommend continue with regular diet and thin liquids. Meds okay whole with liquid. Acknowledge speech language evaluation order and note pt was screened by SLP to find Pt is at cognitive, linguistic baseline. There are no further ST needs at this time. ST to sign off. SLP Visit Diagnosis: Dysphagia, unspecified (R13.10)    Aspiration Risk  Mild aspiration risk    Diet Recommendation Regular;Thin liquid   Liquid Administration via: Cup;Straw Medication Administration: Whole meds with liquid Supervision: Patient able to self feed Compensations: Minimize environmental distractions;Slow rate;Small sips/bites Postural Changes: Seated upright at 90 degrees;Remain upright for at least 30 minutes after po intake    Other  Recommendations Oral Care Recommendations: Oral care BID   Follow up Recommendations None  Swallow Study   General Date of Onset: 03/28/18 HPI: 73 y.o. female with complex past medical history including CVA, hypertension, hyperlipidemia, COPD with ongoing tobacco abuse, who presented to the emergency department after reporting a loss of consciousness that she  describes as a "blackout spell ".    Type of Study: Bedside Swallow Evaluation Previous Swallow Assessment: MBS 2017 Diet Prior to this Study: Thin liquids;Regular Temperature Spikes Noted: No Respiratory Status: Room air History of Recent Intubation: No Behavior/Cognition: Alert;Cooperative;Pleasant mood Oral Cavity Assessment: Within Functional Limits Oral Care Completed by SLP: No Oral Cavity - Dentition: Dentures, bottom;Dentures, top Vision: Functional for self-feeding Self-Feeding Abilities: Able to feed self Patient Positioning: Upright in bed Baseline Vocal Quality: Normal Volitional Cough: Strong Volitional Swallow: Able to elicit    Oral/Motor/Sensory Function Overall Oral Motor/Sensory Function: Within functional limits   Ice Chips Ice chips: Not tested   Thin Liquid Thin Liquid: Within functional limits    Nectar Thick Nectar Thick Liquid: Not tested   Honey Thick Honey Thick Liquid: Not tested   Puree Puree: Within functional limits   Solid     Solid: Within functional limits     Chandler Swiderski H. Roddie Mc, CCC-SLP Speech Language Pathologist  Wende Bushy 03/30/2018,3:30 PM

## 2018-03-30 NOTE — Procedures (Signed)
History: 73 yo F being evaluated for blackout spells  Sedation: none  Technique: This is a 21 channel routine scalp EEG performed at the bedside with bipolar and monopolar montages arranged in accordance to the international 10/20 system of electrode placement. One channel was dedicated to EKG recording.    Background: The background consists of intermixed alpha and beta activities. There is a well defined posterior dominant rhythm of 8-9 Hz that attenuates with eye opening. Sleep is recorded with normal appearing structures.   Photic stimulation: Physiologic driving is not perfomred  EEG Abnormalities: none  Clinical Interpretation: This normal EEG is recorded in the waking and sleep state. There was no seizure or seizure predisposition recorded on this study. Please note that lack of epileptiform activity on EEG does not preclude the possibility of epilepsy.   Ritta Slot, MD [facility] [phone]  If 7pm- 7am, please page neurology on call as listed in AMION.

## 2018-03-30 NOTE — Plan of Care (Signed)
  Problem: Acute Rehab PT Goals(only PT should resolve) Goal: Patient Will Transfer Sit To/From Stand Outcome: Progressing Flowsheets (Taken 03/30/2018 1323) Patient will transfer sit to/from stand: with modified independence Goal: Pt Will Transfer Bed To Chair/Chair To Bed Outcome: Progressing Flowsheets (Taken 03/30/2018 1323) Pt will Transfer Bed to Chair/Chair to Bed: with modified independence Goal: Pt Will Ambulate Outcome: Progressing Flowsheets (Taken 03/30/2018 1323) Pt will Ambulate: 100 feet; with supervision; with rolling walker   1:25 PM, 03/30/18 Domenick Bookbinder, PT, DPT Physical Therapist with [facility] [phone] mobile phone

## 2018-03-30 NOTE — Evaluation (Signed)
Physical Therapy Evaluation Patient Details Name: Connie Ponce MRN: [id number] DOB: Nov 19, 1945 Today's Date: 03/30/2018   History of Present Illness  Connie Ponce is a 73 y.o. female with complex past medical history including CVA, hypertension, hyperlipidemia, COPD with ongoing tobacco abuse, who presented to the emergency department after reporting a loss of consciousness that happened last night that she describes as a "blackout spell ".  The patient reports that her daughter called her into another room to see something and the patient stood up from a seated position and apparently passed out for approximately 3 to 4 minutes.  The patient does not remember exactly what happened.  The patient says she did not have any prior chest pain symptoms or shortness of breath.  She does not have a history of syncope or seizures.  The patient denies dizziness.     Clinical Impression  Patient near baseline for functional mobility and gait, slightly limited secondary to generalized weakness and fatigue during activity. Patient able to complete transfers and ambulate with RW, limited by left great toe fracture requiring increased time to complete tasks. Patient left in chair with all needs in reach and educated to use nursing assistance to ambulate to restroom. Patient will benefit from continued physical therapy in hospital to increase strength, balance, endurance for safe ADLs and gait.     Follow Up Recommendations Home health PT    Equipment Recommendations  None recommended by PT    Recommendations for Other Services       Precautions / Restrictions Precautions Precautions: Fall Required Braces or Orthoses: Other Brace Other Brace: L walking boot for stress fracture of L great toe Restrictions Weight Bearing Restrictions: No      Mobility  Bed Mobility Overal bed mobility: Modified Independent             General bed mobility comments: increased time, use of  bedrail  Transfers Overall transfer level: Needs assistance Equipment used: Rolling walker (2 wheeled) Transfers: Sit to/from [employer] Sit to Stand: Min guard Stand pivot transfers: Min guard       General transfer comment: increased time  Ambulation/Gait Ambulation/Gait assistance: Min guard Gait Distance (Feet): 75 Feet Assistive device: Rolling walker (2 wheeled) Gait Pattern/deviations: Decreased step length - right;Decreased step length - left;Decreased stride length;Step-through pattern Gait velocity: decreased   General Gait Details: decreased left foot heel strike/toe-off due to walking boot  Stairs            Wheelchair Mobility    Modified Rankin (Stroke Patients Only)       Balance Overall balance assessment: Modified Independent Sitting-balance support: Feet supported;No upper extremity supported Sitting balance-Leahy Scale: Good     Standing balance support: During functional activity;Bilateral upper extremity supported Standing balance-Leahy Scale: Fair Standing balance comment: using RW                             Pertinent Vitals/Pain Pain Assessment: No/denies pain    Home Living Family/patient expects to be discharged to:: Private residence Living Arrangements: Spouse/significant other Available Help at Discharge: Family;Available PRN/intermittently(Patient's daughter assists her with bathing, dressing, cooking, and assisting into bed for nighttime routine and spouse works 2nd shift) Type of Home: Mobile home Home Access: Stairs to enter Entrance Stairs-Rails: Right;Left;Can reach both Entrance Stairs-Number of Steps: 3 Home Layout: One level Home Equipment: Walker - 2 wheels;Cane - single point      Prior Function Level of Independence:  Independent with assistive device(s)         Comments: household ambulator with assistive device, limited community ambulator using RW, drives     Hand Dominance    Dominant Hand: Right    Extremity/Trunk Assessment   Upper Extremity Assessment Upper Extremity Assessment: Defer to OT evaluation    Lower Extremity Assessment Lower Extremity Assessment: Generalized weakness(L great toe fracture with walking boot donned)    Cervical / Trunk Assessment Cervical / Trunk Assessment: Normal  Communication   Communication: No difficulties  Cognition Arousal/Alertness: Awake/alert Behavior During Therapy: WFL for tasks assessed/performed Overall Cognitive Status: Within Functional Limits for tasks assessed                                        General Comments      Exercises     Assessment/Plan    PT Assessment Patient needs continued PT services  PT Problem List Decreased strength;Decreased activity tolerance;Decreased balance;Decreased mobility       PT Treatment Interventions Therapeutic exercise;Gait training;Stair training;Functional mobility training;Patient/family education;Therapeutic activities    PT Goals (Current goals can be found in the Care Plan section)  Acute Rehab PT Goals Patient Stated Goal: return home PT Goal Formulation: With patient Time For Goal Achievement: 04/06/18 Potential to Achieve Goals: Good    Frequency Min 2X/week   Barriers to discharge        Co-evaluation               AM-PAC PT "6 Clicks" Mobility  Outcome Measure Help needed turning from your back to your side while in a flat bed without using bedrails?: None Help needed moving from lying on your back to sitting on the side of a flat bed without using bedrails?: None Help needed moving to and from a bed to a chair (including a wheelchair)?: None Help needed standing up from a chair using your arms (e.g., wheelchair or bedside chair)?: A Little(increased time) Help needed to walk in hospital room?: A Little(increased time) Help needed climbing 3-5 steps with a railing? : A Lot 6 Click Score: 20    End of Session  Equipment Utilized During Treatment: Gait belt Activity Tolerance: Patient tolerated treatment well;Patient limited by fatigue Patient left: in chair;with call bell/phone within reach Nurse Communication: Mobility status PT Visit Diagnosis: Unsteadiness on feet (R26.81);Other abnormalities of gait and mobility (R26.89);Muscle weakness (generalized) (M62.81)    Time: 3254-9826 PT Time Calculation (min) (ACUTE ONLY): 16 min   Charges:   PT Evaluation $PT Eval Low Complexity: 1 Low PT Treatments $Therapeutic Activity: 8-22 mins        1:37 PM, 03/30/18 Domenick Bookbinder, PT, DPT Physical Therapist with [facility] [phone] mobile phone

## 2018-03-31 DIAGNOSIS — N183 Chronic kidney disease, stage 3 (moderate): Secondary | ICD-10-CM | POA: Diagnosis not present

## 2018-03-31 DIAGNOSIS — I10 Essential (primary) hypertension: Secondary | ICD-10-CM | POA: Diagnosis not present

## 2018-03-31 DIAGNOSIS — R55 Syncope and collapse: Secondary | ICD-10-CM | POA: Diagnosis not present

## 2018-03-31 DIAGNOSIS — J449 Chronic obstructive pulmonary disease, unspecified: Secondary | ICD-10-CM | POA: Diagnosis not present

## 2018-03-31 LAB — GLUCOSE, CAPILLARY: Glucose-Capillary: 100 mg/dL — ABNORMAL HIGH (ref 70–99)

## 2018-03-31 LAB — URINE CULTURE: Culture: >=100000 — AB

## 2018-03-31 MED ORDER — LEVOTHYROXINE SODIUM 150 MCG PO TABS: 150.0000 ug | ORAL_TABLET | Freq: Every day | ORAL | 0 refills | Status: DC

## 2018-03-31 MED ORDER — POTASSIUM CHLORIDE CRYS ER 20 MEQ PO TBCR: 20.0000 meq | EXTENDED_RELEASE_TABLET | ORAL | Status: DC

## 2018-03-31 MED ORDER — FUROSEMIDE 20 MG PO TABS: 20.0000 mg | ORAL_TABLET | ORAL | 2 refills | Status: DC

## 2018-03-31 MED ORDER — OXYCODONE-ACETAMINOPHEN 5-325 MG PO TABS: 1.0000 | ORAL_TABLET | Freq: Four times a day (QID) | ORAL | 0 refills | Status: DC | PRN

## 2018-03-31 NOTE — Discharge Summary (Signed)
Physician Discharge Summary  Connie Ponce MVH:[id number] DOB: [birth year] DOA: 03/28/2018  PCP: Erasmo Downer, NP  Admit date: 03/28/2018 Discharge date: 03/31/2018  Admitted From: Home  Disposition: Home   Recommendations for Outpatient Follow-up:  1. Follow up with PCP in 3-5 days for recheck 2. Follow up with cardiologist in 1 week.  3. Wear 30 day event monitor.   4. Follow up with neurologist in 1-2 weeks for evaluation  Discharge Condition: STABLE   CODE STATUS: FULL    Brief Hospitalization Summary: Please see all hospital notes, images, labs for full details of the hospitalization. HPI: Connie Ponce is a 73 y.o. female with complex past medical history including CVA, hypertension, hyperlipidemia, COPD with ongoing tobacco abuse, who presented to the emergency department after reporting a loss of consciousness that happened last night that she describes as a "blackout spell ".  The patient reports that her daughter called her into another room to see something and the patient stood up from a seated position and apparently passed out for approximately 3 to 4 minutes.  The patient does not remember exactly what happened.  The patient says she did not have any prior chest pain symptoms or shortness of breath.  She does not have a history of syncope or seizures.  The patient denies dizziness.  The patient apparently had urinary incontinence during the episode of loss of consciousness that lasted 3 to 4 minutes.  When the patient woke up she was very groggy but then quickly returned to her baseline.  She has pain and soreness from her fall.  She reports left foot left ankle pain.  She does not think that she hit her head.  She reports tenderness to the left nipple from the fall.  The patient is being admitted for syncope and loss of consciousness.  ED course: Patient was evaluated in the ED and had a trauma work-up done which was positive for a fractured toe in the left foot otherwise no  acute findings.  The patient had an abnormal urinalysis and was started on antibiotics.  The patient's EKG revealed left bundle branch block unchanged from previous tests.  CT of the brain without acute findings.  Hemoglobin noted to be 10.7 which is down from recent tests.  WBC normal at 6.0.  Creatinine elevated at 1.19.  Brief Admission Hx: 73 y.o.femalewithcomplex past medical history including CVA, hypertension, hyperlipidemia, COPD with ongoing tobacco abuse, who presented to the emergency department after reporting a loss of consciousness that she describes as a "blackout spell ".    MDM/Assessment & Plan:   1. Syncope and collapse with loss of consciousness-patient was down for approximately 3 to 4 minutes. She had some urinary incontinence. She does not have a history of seizure but does have known cerebrovascular disease status post CVA. Admitted for observation and further evaluation with neuro checks, orthostatics vital signs were negative, continue telemetry monitoring, awaiting 2D echocardiogram, PT recommended home health but patient has declined, EEG testing completed but no epileptiform activity seen, fall precautions and seizure precautions recommended. Follow electrolytes closely. TSH elevated at 11 which may be contributing.  DC home.  Arranging for a 30-day event monitor.  Arrangements made for a bedside commode and shower stool.  Outpatient neurology follow-up.  Also the patient strongly advised to follow-up with her cardiologist in the next 1 to 2 weeks.  Patient verbalized understanding. 2. E coli UTI- Continue ceftriaxone IV for full 3 doses.  Urine culture confirmed sensitivity to ceftriaxone. 3.  Dehydration-hold diuretics temporarily, hydrate gently with IV fluids. Monitor electrolytes. 4. Anemia of CKD- remained stable. 5. Stage III CKD- gently hydrating with IV fluids, monitor BMP. Avoid nephrotoxins as possible. Renally dose medications.  Creatinine has  improved to 0.95 prior to discharge. 6. Toe fracture- postop shoe, pain management and outpatient follow-up recommended.  Patient declined home health PT services. 7. Hypothyroidism - pt says that she has been faithfully taking her thyroid medication. Her TSH is elevated at 11.  Will increase dose of levothyroxine to 150 mcg.  8. Hyperlipidemia-resume home statin medication. 9. Tobacco abuse-patient strongly advised to discontinue all tobacco use. Provided a nicotine patch for cravings while in hospital. 10. GERD- severe symptoms reported, resume home medications and follow. 11. Essential hypertension- resume home medications. No bradycardia seen on telemetry.  DC Zestoretic.  DVT Prophylaxis:Eliquis Code Status:Full Family Communication:husband at bedside Disposition Plan:DC home    Procedures:  EEG  Echocardiogram   Study Conclusions  - Procedure narrative: Image quality was poor. - Left ventricle: The cavity size was normal. Systolic function wasvigorous. The estimated ejection fraction was in the range of 65% to 70%. Although no diagnostic regional wall motion abnormalitywas identified, this possibility cannot be completely excluded on the basis of this study. Doppler parameters are consistent withabnormal left ventricular relaxation (grade 1 diastolic dysfunction). - Ventricular septum: Abnormal septal motion secondary toconduction delay. - Aortic valve: Transvalvular velocity was within the normal range. There was no stenosis. There was no regurgitation. Mean gradient (S): 3 mm Hg. - Mitral valve: Calcified annulus. Mildly thickened leaflets. There was trivial regurgitation. - Left atrium: The atrium was normal in size. - Right ventricle: Poorly visualized. The cavity size was normal.  Systolic function was mildly reduced. - Right atrium: The atrium was normal in size. Central venous pressure (est): 10 mm Hg. - Inferior vena cava: The vessel was  normal in size. The respirophasic diameter changes were blunted (<50%). - Pericardium, extracardiac: A trivial pericardial effusion was identified.  Antimicrobials:  Ceftriaxone 1/11 > 1/13  Discharge Diagnoses:  Principal Problem:   Syncope and collapse Active Problems:   GERD (gastroesophageal reflux disease)   UTI (urinary tract infection)   Essential hypertension   COPD (chronic obstructive pulmonary disease) (HCC)   Tobacco abuse   Fall at home   CKD (chronic kidney disease) stage 3, GFR 30-59 ml/min (HCC)   Anemia in chronic kidney disease (CKD)   Hyperlipidemia  Discharge Instructions: Discharge Instructions    Call MD for:  difficulty breathing, headache or visual disturbances   Complete by:  As directed    Call MD for:  extreme fatigue   Complete by:  As directed    Call MD for:  persistant dizziness or light-headedness   Complete by:  As directed    Call MD for:  persistant nausea and vomiting   Complete by:  As directed    Call MD for:  severe uncontrolled pain   Complete by:  As directed    Diet - low sodium heart healthy   Complete by:  As directed    Increase activity slowly   Complete by:  As directed      Allergies as of 03/31/2018      Reactions   Tetracyclines & Related    Shellfish-derived Products Nausea And Vomiting      Medication List    STOP taking these medications   triamterene-hydrochlorothiazide 37.5-25 MG tablet Commonly known as:  MAXZIDE-25     TAKE these medications  albuterol 108 (90 Base) MCG/ACT inhaler Commonly known as:  PROVENTIL HFA;VENTOLIN HFA Inhale 2 puffs into the lungs every 4 (four) hours as needed for wheezing or shortness of breath.   allopurinol 100 MG tablet Commonly known as:  ZYLOPRIM Take 100 mg by mouth as needed.   aspirin 81 MG EC tablet Take 2 tablets (162 mg total) by mouth daily.   budesonide-formoterol 160-4.5 MCG/ACT inhaler Commonly known as:  SYMBICORT Inhale 2 puffs into the  lungs 2 (two) times daily as needed (shortness of breath).   carvedilol 12.5 MG tablet Commonly known as:  COREG 12.5 mg 2 (two) times daily with a meal.   DEXILANT 60 MG capsule Generic drug:  dexlansoprazole Take 60 mg by mouth daily.   diltiazem 360 MG 24 hr capsule Commonly known as:  TIAZAC Take 360 mg by mouth daily.   ELIQUIS 5 MG Tabs tablet Generic drug:  apixaban Take 5 mg by mouth 2 (two) times daily.   furosemide 20 MG tablet Commonly known as:  LASIX Take 1 tablet (20 mg total) by mouth every other day. Start taking on:  April 03, 2018 What changed:    when to take this  These instructions start on April 03, 2018. If you are unsure what to do until then, ask your doctor or other care provider.   gabapentin 300 MG capsule Commonly known as:  NEURONTIN 300 mg 3 (three) times daily.   hydrOXYzine 25 MG tablet Commonly known as:  ATARAX/VISTARIL Take 25 mg by mouth every 4 (four) hours as needed for itching.   levothyroxine 150 MCG tablet Commonly known as:  SYNTHROID, LEVOTHROID Take 1 tablet (150 mcg total) by mouth daily before breakfast for 30 days. What changed:    medication strength  how much to take   oxyCODONE-acetaminophen 5-325 MG tablet Commonly known as:  PERCOCET/ROXICET Take 1 tablet by mouth every 6 (six) hours as needed for severe pain. What changed:    how to take this  reasons to take this   potassium chloride SA 20 MEQ tablet Commonly known as:  K-DUR,KLOR-CON Take 1 tablet (20 mEq total) by mouth every other day. Start taking on:  April 03, 2018 What changed:    how much to take  how to take this  when to take this  These instructions start on April 03, 2018. If you are unsure what to do until then, ask your doctor or other care provider.   pravastatin 40 MG tablet Commonly known as:  PRAVACHOL Take 40 mg by mouth daily.   ranitidine 150 MG tablet Commonly known as:  ZANTAC Take 150 mg by mouth at  bedtime.   sertraline 50 MG tablet Commonly known as:  ZOLOFT   SPIRIVA HANDIHALER 18 MCG inhalation capsule Generic drug:  tiotropium Place 18 mcg into inhaler and inhale as needed.   traZODone 150 MG tablet Commonly known as:  DESYREL   Vitamin D-3 125 MCG (5000 UT) Tabs Take 1 tablet by mouth daily.            Durable Medical Equipment  (From admission, onward)         Start     Ordered   03/30/18 1436  For home use only DME 3 n 1  Once     03/30/18 1436   03/30/18 1436  For home use only DME Shower stool  Once     03/30/18 1436         Follow-up Information  Erasmo Downer, NP. Schedule an appointment as soon as possible for a visit in 3 day(s).   Specialty:  Nurse Practitioner Why:  Hospital follow-up Contact information: [address] [place] [address] [phone]        Cardiologist with [facility]. Schedule an appointment as soon as possible for a visit in 1 week(s).   Why:  Hospital Follow Up for syncopal episode:  Please ask them to send for your records from [facility].        Darreld Mclean, MD. Schedule an appointment as soon as possible for a visit in 2 week(s).   Specialty:  Orthopedic Surgery Why:  Follow up toe fracture  Contact information: [address] [place] [address] [phone]        Beryle Beams, MD. Schedule an appointment as soon as possible for a visit in 2 week(s).   Specialty:  Neurology Why:  Hospital Follow Up for blackout spells  Contact information: [address] [place] [address] [phone]          Allergies  Allergen Reactions  . Tetracyclines & Related   . Shellfish-Derived Products Nausea And Vomiting   Allergies as of 03/31/2018      Reactions   Tetracyclines & Related    Shellfish-derived Products Nausea And Vomiting      Medication List    STOP taking these medications   triamterene-hydrochlorothiazide 37.5-25 MG tablet Commonly known as:  MAXZIDE-25      TAKE these medications   albuterol 108 (90 Base) MCG/ACT inhaler Commonly known as:  PROVENTIL HFA;VENTOLIN HFA Inhale 2 puffs into the lungs every 4 (four) hours as needed for wheezing or shortness of breath.   allopurinol 100 MG tablet Commonly known as:  ZYLOPRIM Take 100 mg by mouth as needed.   aspirin 81 MG EC tablet Take 2 tablets (162 mg total) by mouth daily.   budesonide-formoterol 160-4.5 MCG/ACT inhaler Commonly known as:  SYMBICORT Inhale 2 puffs into the lungs 2 (two) times daily as needed (shortness of breath).   carvedilol 12.5 MG tablet Commonly known as:  COREG 12.5 mg 2 (two) times daily with a meal.   DEXILANT 60 MG capsule Generic drug:  dexlansoprazole Take 60 mg by mouth daily.   diltiazem 360 MG 24 hr capsule Commonly known as:  TIAZAC Take 360 mg by mouth daily.   ELIQUIS 5 MG Tabs tablet Generic drug:  apixaban Take 5 mg by mouth 2 (two) times daily.   furosemide 20 MG tablet Commonly known as:  LASIX Take 1 tablet (20 mg total) by mouth every other day. Start taking on:  April 03, 2018 What changed:    when to take this  These instructions start on April 03, 2018. If you are unsure what to do until then, ask your doctor or other care provider.   gabapentin 300 MG capsule Commonly known as:  NEURONTIN 300 mg 3 (three) times daily.   hydrOXYzine 25 MG tablet Commonly known as:  ATARAX/VISTARIL Take 25 mg by mouth every 4 (four) hours as needed for itching.   levothyroxine 150 MCG tablet Commonly known as:  SYNTHROID, LEVOTHROID Take 1 tablet (150 mcg total) by mouth daily before breakfast for 30 days. What changed:    medication strength  how much to take   oxyCODONE-acetaminophen 5-325 MG tablet Commonly known as:  PERCOCET/ROXICET Take 1 tablet by mouth every 6 (six) hours as needed for severe pain. What changed:    how to take  this  reasons to take this   potassium chloride SA 20 MEQ tablet Commonly known as:   K-DUR,KLOR-CON Take 1 tablet (20 mEq total) by mouth every other day. Start taking on:  April 03, 2018 What changed:    how much to take  how to take this  when to take this  These instructions start on April 03, 2018. If you are unsure what to do until then, ask your doctor or other care provider.   pravastatin 40 MG tablet Commonly known as:  PRAVACHOL Take 40 mg by mouth daily.   ranitidine 150 MG tablet Commonly known as:  ZANTAC Take 150 mg by mouth at bedtime.   sertraline 50 MG tablet Commonly known as:  ZOLOFT   SPIRIVA HANDIHALER 18 MCG inhalation capsule Generic drug:  tiotropium Place 18 mcg into inhaler and inhale as needed.   traZODone 150 MG tablet Commonly known as:  DESYREL   Vitamin D-3 125 MCG (5000 UT) Tabs Take 1 tablet by mouth daily.            Durable Medical Equipment  (From admission, onward)         Start     Ordered   03/30/18 1436  For home use only DME 3 n 1  Once     03/30/18 1436   03/30/18 1436  For home use only DME Shower stool  Once     03/30/18 1436          Procedures/Studies: Dg Chest 2 View  Result Date: 03/28/2018 CLINICAL DATA:  Chest pain after fall. EXAM: CHEST - 2 VIEW COMPARISON:  Radiographs of October 03, 2017. FINDINGS: The heart size and mediastinal contours are within normal limits. Both lungs are clear. No pneumothorax or pleural effusion is noted. The visualized skeletal structures are unremarkable. IMPRESSION: No active cardiopulmonary disease. Electronically Signed   By: Lupita Raider, M.D.   On: 03/28/2018 13:42   Dg Ankle Complete Left  Result Date: 03/28/2018 CLINICAL DATA:  Left ankle pain after fall. EXAM: LEFT ANKLE COMPLETE - 3+ VIEW COMPARISON:  None. FINDINGS: There is no evidence of fracture, dislocation, or joint effusion. There is no evidence of arthropathy or other focal bone abnormality. Soft tissues are unremarkable. IMPRESSION: Negative. Electronically Signed   By: Lupita Raider,  M.D.   On: 03/28/2018 13:44   Ct Head Wo Contrast  Result Date: 03/28/2018 CLINICAL DATA:  Syncopal episode last night. Patient denies headache or neck pain. EXAM: CT HEAD WITHOUT CONTRAST CT CERVICAL SPINE WITHOUT CONTRAST TECHNIQUE: Multidetector CT imaging of the head and cervical spine was performed following the standard protocol without intravenous contrast. Multiplanar CT image reconstructions of the cervical spine were also generated. COMPARISON:  CT neck dated February 20, 2018. CT head dated October 03, 2017. MRI cervical spine dated January 03, 2016. FINDINGS: CT HEAD FINDINGS Brain: No evidence of acute infarction, hemorrhage, hydrocephalus, extra-axial collection or mass lesion/mass effect. Unchanged old lacunar infarct in the left basal ganglia. Vascular: Atherosclerotic vascular calcification of the carotid siphons. No hyperdense vessel. Skull: Unchanged diffuse calvarial hyperostosis. Negative for fracture or focal lesion. Sinuses/Orbits: No acute finding. Other: None. CT CERVICAL SPINE FINDINGS Alignment: Normal. Skull base and vertebrae: No acute fracture. No primary bone lesion or focal pathologic process. Soft tissues and spinal canal: No prevertebral fluid or swelling. No visible canal hematoma. Disc levels: Disc spaces are relatively preserved. Large bulky ventral osteophytes throughout the cervical spine. Scattered mild facet arthropathy. Upper chest: Mild  centrilobular emphysema. Other: Unchanged asymmetric thickening of the right aryepiglottic fold. IMPRESSION: 1.  No acute intracranial abnormality. 2.  No acute cervical spine fracture. 3. Unchanged asymmetric thickening of the right aryepiglottic fold. Correlate with direct visualization. 4.  Emphysema (ICD10-J43.9). Electronically Signed   By: Obie Dredge M.D.   On: 03/28/2018 13:39   Ct Cervical Spine Wo Contrast  Result Date: 03/28/2018 CLINICAL DATA:  Syncopal episode last night. Patient denies headache or neck pain. EXAM: CT  HEAD WITHOUT CONTRAST CT CERVICAL SPINE WITHOUT CONTRAST TECHNIQUE: Multidetector CT imaging of the head and cervical spine was performed following the standard protocol without intravenous contrast. Multiplanar CT image reconstructions of the cervical spine were also generated. COMPARISON:  CT neck dated February 20, 2018. CT head dated October 03, 2017. MRI cervical spine dated January 03, 2016. FINDINGS: CT HEAD FINDINGS Brain: No evidence of acute infarction, hemorrhage, hydrocephalus, extra-axial collection or mass lesion/mass effect. Unchanged old lacunar infarct in the left basal ganglia. Vascular: Atherosclerotic vascular calcification of the carotid siphons. No hyperdense vessel. Skull: Unchanged diffuse calvarial hyperostosis. Negative for fracture or focal lesion. Sinuses/Orbits: No acute finding. Other: None. CT CERVICAL SPINE FINDINGS Alignment: Normal. Skull base and vertebrae: No acute fracture. No primary bone lesion or focal pathologic process. Soft tissues and spinal canal: No prevertebral fluid or swelling. No visible canal hematoma. Disc levels: Disc spaces are relatively preserved. Large bulky ventral osteophytes throughout the cervical spine. Scattered mild facet arthropathy. Upper chest: Mild centrilobular emphysema. Other: Unchanged asymmetric thickening of the right aryepiglottic fold. IMPRESSION: 1.  No acute intracranial abnormality. 2.  No acute cervical spine fracture. 3. Unchanged asymmetric thickening of the right aryepiglottic fold. Correlate with direct visualization. 4.  Emphysema (ICD10-J43.9). Electronically Signed   By: Obie Dredge M.D.   On: 03/28/2018 13:39   Dg Esophagus  Result Date: 03/16/2018 CLINICAL DATA:  Dysphagia. EXAM: ESOPHOGRAM/BARIUM SWALLOW TECHNIQUE: Single contrast examination was performed using  thin barium. FLUOROSCOPY TIME:  Fluoroscopy Time:  1 minutes 48 seconds Radiation Exposure Index (if provided by the fluoroscopic device): 50.3 mGy Number of  Acquired Spot Images: 31 FINDINGS: Small anterior cervical esophageal web noted. No aspiration. Thoracic esophagus is widely patent. 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 No gastroesophageal reflux. Standardized barium tablet passes normally. Cervicothoracic degenerative change and possible ankylosis noted. Ankylosing spondylitis can not be excluded. Lucency noted over the left chest is most likely outside of the patient. IMPRESSION: 1.  Small anterior cervical esophageal web noted.  No aspiration. 2. 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 No gastroesophageal reflux. No evidence of esophageal obstruction. 3. Cervicothoracic spine degenerative change and possible ankylosing spondylitis. Electronically Signed   By: Maisie Fus  Register   On: 03/16/2018 09:39   Dg Knee Complete 4 Views Right  Result Date: 03/28/2018 CLINICAL DATA:  Right knee pain after fall. EXAM: RIGHT KNEE - COMPLETE 4+ VIEW COMPARISON:  None. FINDINGS: Status post right total knee arthroplasty. The femoral and tibial components appear to be well situated. No fracture or dislocation is noted. No joint effusion is noted. No soft tissue abnormality is noted. IMPRESSION: No acute abnormality seen in the right knee. Electronically Signed   By: Lupita Raider, M.D.   On: 03/28/2018 13:43   Dg Foot Complete Left  Result Date: 03/28/2018 CLINICAL DATA:  Left foot pain after fall. EXAM: LEFT FOOT - COMPLETE 3+ VIEW COMPARISON:  None. FINDINGS: Possible nondisplaced fracture is seen involving proximal base of first proximal phalanx. No dislocation is noted.  Moderate degenerative changes seen involving the first metatarsophalangeal joint. Soft tissues are unremarkable. IMPRESSION: Moderate osteoarthritis of first metatarsophalangeal joint. Possible nondisplaced fracture involving proximal base of first proximal phalanx. Electronically Signed   By: Lupita RaiderJames  Green Jr, M.D.   On: 03/28/2018 13:48       Subjective: Patient says she is feeling much better.  She does have some toe pain but otherwise no recurrence of syncopal episodes.  She denies chest pain or shortness of breath.  Pt declines Home health services but says family will be with her 24/7 at home.   Discharge Exam: Vitals:   03/31/18 0504 03/31/18 0828  BP: (!) 142/67   Pulse: 72   Resp: 18   Temp: 98.1 F (36.7 C)   SpO2: 99% 96%   Vitals:   03/30/18 1946 03/30/18 2202 03/31/18 0504 03/31/18 0828  BP:  139/79 (!) 142/67   Pulse:  64 72   Resp:  18 18   Temp:  97.9 F (36.6 C) 98.1 F (36.7 C)   TempSrc:  Oral Oral   SpO2: 99% 99% 99% 96%  Weight:   89.2 kg   Height:       General exam: awake, alert, NAD. Cooperative.  Respiratory system: Clear. No increased work of breathing. Cardiovascular system: S1 & S2 heard, RRR. No JVD, murmurs, gallops, clicks or pedal edema. Gastrointestinal system: Abdomen is nondistended, soft and nontender. Normal bowel sounds heard. Central nervous system: Alert and oriented. No focal neurological deficits. Extremities: no CCE.   The results of significant diagnostics from this hospitalization (including imaging, microbiology, ancillary and laboratory) are listed below for reference.     Microbiology: Recent Results (from the past 240 hour(s))  Urine culture     Status: Abnormal   Collection Time: 03/28/18 12:52 PM  Result Value Ref Range Status   Specimen Description   Final    URINE, CLEAN CATCH Performed at [facility], [address] [place], [place] [address]    Special Requests   Final    NONE Performed at [facility], [address] [place], [place] [address]    Culture >=100,000 COLONIES/mL ESCHERICHIA COLI (A)  Final   Report Status 03/31/2018 FINAL  Final   Organism ID, Bacteria ESCHERICHIA COLI (A)  Final      Susceptibility   Escherichia coli - MIC*    AMPICILLIN <=2 SENSITIVE Sensitive     CEFAZOLIN <=4 SENSITIVE Sensitive     CEFTRIAXONE <=1  SENSITIVE Sensitive     CIPROFLOXACIN <=0.25 SENSITIVE Sensitive     GENTAMICIN <=1 SENSITIVE Sensitive     IMIPENEM <=0.25 SENSITIVE Sensitive     NITROFURANTOIN <=16 SENSITIVE Sensitive     TRIMETH/SULFA <=20 SENSITIVE Sensitive     AMPICILLIN/SULBACTAM <=2 SENSITIVE Sensitive     PIP/TAZO <=4 SENSITIVE Sensitive     Extended ESBL NEGATIVE Sensitive     * >=100,000 COLONIES/mL ESCHERICHIA COLI     Labs: BNP (last 3 results) Recent Labs    08/10/17 0928  BNP 100.0   Basic Metabolic Panel: Recent Labs  Lab 03/28/18 1142 03/29/18 0649 03/30/18 0520  NA 135 139 140  K 3.9 3.9 3.7  CL 101 106 108  CO2 25 28 27   GLUCOSE 103* 93 93  BUN 19 15 11   CREATININE 1.19* 0.98 0.95  CALCIUM 9.6 9.3 9.4  MG  --  1.9 2.0   Liver Function Tests: Recent Labs  Lab 03/29/18 0649 03/30/18 0520  AST 11* 9*  ALT 10  9  ALKPHOS 67 64  BILITOT 0.3 0.3  PROT 6.0* 5.9*  ALBUMIN 3.0* 2.9*   No results for input(s): LIPASE, AMYLASE in the last 168 hours. No results for input(s): AMMONIA in the last 168 hours. CBC: Recent Labs  Lab 03/28/18 1142 03/29/18 0649 03/30/18 0520  WBC 6.0 5.1 5.4  NEUTROABS  --  2.5 2.6  HGB 10.7* 9.9* 9.7*  HCT 33.8* 32.3* 30.5*  MCV 92.9 94.4 95.0  PLT 243 173 195   Cardiac Enzymes: No results for input(s): CKTOTAL, CKMB, CKMBINDEX, TROPONINI in the last 168 hours. BNP: Invalid input(s): POCBNP CBG: Recent Labs  Lab 03/28/18 1147 03/29/18 0448 03/30/18 0508 03/31/18 0501  GLUCAP 99 92 105* 100*   D-Dimer No results for input(s): DDIMER in the last 72 hours. Hgb A1c Recent Labs    03/28/18 1142  HGBA1C 5.5   Lipid Profile No results for input(s): CHOL, HDL, LDLCALC, TRIG, CHOLHDL, LDLDIRECT in the last 72 hours. Thyroid function studies Recent Labs    03/28/18 1142  TSH 11.231*   Anemia work up No results for input(s): VITAMINB12, FOLATE, FERRITIN, TIBC, IRON, RETICCTPCT in the last 72 hours. Urinalysis    Component Value  Date/Time   COLORURINE YELLOW 03/28/2018 1252   APPEARANCEUR HAZY (A) 03/28/2018 1252   LABSPEC 1.020 03/28/2018 1252   PHURINE 5.0 03/28/2018 1252   GLUCOSEU NEGATIVE 03/28/2018 1252   HGBUR NEGATIVE 03/28/2018 1252   BILIRUBINUR NEGATIVE 03/28/2018 1252   KETONESUR NEGATIVE 03/28/2018 1252   PROTEINUR NEGATIVE 03/28/2018 1252   UROBILINOGEN 0.2 09/19/2014 0105   NITRITE POSITIVE (A) 03/28/2018 1252   LEUKOCYTESUR SMALL (A) 03/28/2018 1252   Sepsis Labs Invalid input(s): PROCALCITONIN,  WBC,  LACTICIDVEN Microbiology Recent Results (from the past 240 hour(s))  Urine culture     Status: Abnormal   Collection Time: 03/28/18 12:52 PM  Result Value Ref Range Status   Specimen Description   Final    URINE, CLEAN CATCH Performed at [facility], [address], [place], [place] [address]    Special Requests   Final    NONE Performed at [facility], [address], [place], [place] [address]    Culture >=100,000 COLONIES/mL ESCHERICHIA COLI (A)  Final   Report Status 03/31/2018 FINAL  Final   Organism ID, Bacteria ESCHERICHIA COLI (A)  Final      Susceptibility   Escherichia coli - MIC*    AMPICILLIN <=2 SENSITIVE Sensitive     CEFAZOLIN <=4 SENSITIVE Sensitive     CEFTRIAXONE <=1 SENSITIVE Sensitive     CIPROFLOXACIN <=0.25 SENSITIVE Sensitive     GENTAMICIN <=1 SENSITIVE Sensitive     IMIPENEM <=0.25 SENSITIVE Sensitive     NITROFURANTOIN <=16 SENSITIVE Sensitive     TRIMETH/SULFA <=20 SENSITIVE Sensitive     AMPICILLIN/SULBACTAM <=2 SENSITIVE Sensitive     PIP/TAZO <=4 SENSITIVE Sensitive     Extended ESBL NEGATIVE Sensitive     * >=100,000 COLONIES/mL ESCHERICHIA COLI    Time coordinating discharge:   SIGNED:  Standley Dakins, MD  [facility] 03/31/2018, 9:40 AM

## 2018-03-31 NOTE — Discharge Instructions (Signed)
Seek medical care or return to ER if symptoms come back, worsen or new problems develop. A 30 day event monitor will be mailed to your home for you to wear to monitor your heart for any signs of abnormal activity.  Please stop smoking and using all tobacco products.

## 2018-03-31 NOTE — Progress Notes (Signed)
Physical Therapy Treatment Patient Details Name: Connie Ponce MRN: [id number] DOB: [birth year] Today's Date: 03/31/2018    History of Present Illness Connie Ponce is a 73 y.o. female with complex past medical history including CVA, hypertension, hyperlipidemia, COPD with ongoing tobacco abuse, who presented to the emergency department after reporting a loss of consciousness that happened last night that she describes as a "blackout spell ".  The patient reports that her daughter called her into another room to see something and the patient stood up from a seated position and apparently passed out for approximately 3 to 4 minutes.  The patient does not remember exactly what happened.  The patient says she did not have any prior chest pain symptoms or shortness of breath.  She does not have a history of syncope or seizures.  The patient denies dizziness.     PT Comments    Patient with improved endurance tolerating an increase in ambulation distance and requiring less assistance ambulating with supervision using RW and with left walking boot donned. Patient able to perform transfers at modified independent requiring increased time due to left walking boot.  PLAN:  Patient plans to be discharged home today and discharged from acute physical therapy with recommendations stated below to further address weakness, ambulation and overall functional mobility once home.    Follow Up Recommendations  Home health PT     Equipment Recommendations  None recommended by PT    Recommendations for Other Services       Precautions / Restrictions Precautions Precautions: Fall Required Braces or Orthoses: Other Brace Other Brace: L walking boot for stress fracture of L great toe Restrictions Weight Bearing Restrictions: No    Mobility  Bed Mobility                  Transfers Overall transfer level: Modified independent Equipment used: Rolling walker (2 wheeled) Transfers: Sit to/from  Stand Sit to Stand: Modified independent (Device/Increase time)         General transfer comment: increased time  Ambulation/Gait Ambulation/Gait assistance: Supervision Gait Distance (Feet): 120 Feet Assistive device: Rolling walker (2 wheeled) Gait Pattern/deviations: Decreased step length - right;Decreased step length - left;Decreased stride length;Step-through pattern Gait velocity: decreased   General Gait Details: decreased left foot heel strike/toe-off due to walking boot   Stairs             Wheelchair Mobility    Modified Rankin (Stroke Patients Only)       Balance Overall balance assessment: Modified Independent Sitting-balance support: Feet supported;No upper extremity supported Sitting balance-Leahy Scale: Good     Standing balance support: During functional activity;Bilateral upper extremity supported Standing balance-Leahy Scale: Fair Standing balance comment: using RW during transfers and ambulation                            Cognition Arousal/Alertness: Awake/alert Behavior During Therapy: WFL for tasks assessed/performed Overall Cognitive Status: Within Functional Limits for tasks assessed                                        Exercises      General Comments        Pertinent Vitals/Pain Pain Assessment: No/denies pain    Home Living  Prior Function            PT Goals (current goals can now be found in the care plan section) Acute Rehab PT Goals Patient Stated Goal: return home with family PT Goal Formulation: With patient Time For Goal Achievement: 04/06/18 Potential to Achieve Goals: Good    Frequency    Min 2X/week      PT Plan      Co-evaluation              AM-PAC PT "6 Clicks" Mobility   Outcome Measure  Help needed turning from your back to your side while in a flat bed without using bedrails?: None Help needed moving from lying on your back  to sitting on the side of a flat bed without using bedrails?: None Help needed moving to and from a bed to a chair (including a wheelchair)?: None Help needed standing up from a chair using your arms (e.g., wheelchair or bedside chair)?: A Little Help needed to walk in hospital room?: A Little Help needed climbing 3-5 steps with a railing? : A Lot 6 Click Score: 20    End of Session   Activity Tolerance: Patient tolerated treatment well Patient left: in chair;with family/visitor present;with call bell/phone within reach Nurse Communication: Mobility status PT Visit Diagnosis: Unsteadiness on feet (R26.81);Other abnormalities of gait and mobility (R26.89);Muscle weakness (generalized) (M62.81)     Time: 1610-9604 PT Time Calculation (min) (ACUTE ONLY): 10 min  Charges:  $Gait Training: 8-22 mins                     11:21 AM, 03/31/18 Domenick Bookbinder, PT, DPT Physical Therapist with [facility] [phone] mobile phone

## 2018-04-01 ENCOUNTER — Telehealth: Payer: Self-pay | Admitting: *Deleted

## 2018-04-01 DIAGNOSIS — R55 Syncope and collapse: Secondary | ICD-10-CM

## 2018-04-01 NOTE — Telephone Encounter (Signed)
Order placed. Pt enrolled in [employer]  

## 2018-04-01 NOTE — Telephone Encounter (Signed)
-----   Message from Cleora Fleet, MD sent at 03/31/2018  9:26 AM EST ----- Regarding: 30 day event monitor This patient is discharging from [facility] today for a syncope evaluation.  Please arrange for a 30-day event monitor.  Okay to send to patient's home.  Thank you very much for your assistance.  Maryln Manuel MD

## 2018-04-15 ENCOUNTER — Ambulatory Visit (INDEPENDENT_AMBULATORY_CARE_PROVIDER_SITE_OTHER): Payer: Medicare Other

## 2018-04-15 ENCOUNTER — Ambulatory Visit (INDEPENDENT_AMBULATORY_CARE_PROVIDER_SITE_OTHER): Payer: Medicare Other | Admitting: Orthopaedic Surgery

## 2018-04-15 ENCOUNTER — Encounter: Payer: Self-pay | Admitting: Orthopaedic Surgery

## 2018-04-15 VITALS — BP 141/73 | HR 66 | Ht 64.0 in | Wt 198.0 lb

## 2018-04-15 DIAGNOSIS — M25561 Pain in right knee: Secondary | ICD-10-CM

## 2018-04-15 DIAGNOSIS — M25572 Pain in left ankle and joints of left foot: Secondary | ICD-10-CM | POA: Diagnosis not present

## 2018-04-15 DIAGNOSIS — S92315A Nondisplaced fracture of first metatarsal bone, left foot, initial encounter for closed fracture: Secondary | ICD-10-CM

## 2018-04-15 NOTE — Progress Notes (Signed)
Patient Connie Ponce, female DOB:09-15-1945, 73 y.o. ONG:[id number]  Chief Complaint  Patient presents with  . Knee Pain    Bilateral    HPI  Connie Ponce is a 73 y.o. female who fell on 03-28-2018 and hurt her left foot and right knee.  She was seen in the ER.  X-rays showed a fracture of the first metatarsal on the left nondisplaced and negative right knee.  She is post total knee on the right.  She says the left foot is tender but the right knee is still hurting.  She has a post op shoe for the left and uses a cane.    I have reviewed the X-rays and ER report from 03-28-2018.  She has no giving way of the knee on the right, no swelling, no redness, no fever or chills.   Body mass index is 33.99 kg/m.  ROS  Review of Systems  Constitutional: Positive for activity change.  Respiratory: Positive for shortness of breath. Negative for cough.   Musculoskeletal: Positive for arthralgias, gait problem and joint swelling.  Psychiatric/Behavioral: The patient is nervous/anxious.   All other systems reviewed and are negative.   All other systems reviewed and are negative.  The following is a summary of the past history medically, past history surgically, known current medicines, social history and family history.  This information is gathered electronically by the computer from prior information and documentation.  I review this each visit and have found including this information at this point in the chart is beneficial and informative.    Past Medical History:  Diagnosis Date  . Anxiety   . Carpal tunnel syndrome   . COPD (chronic obstructive pulmonary disease) ([place])   . Gastric ulcer   . GERD (gastroesophageal reflux disease)   . Hyperglycemia   . Hyperlipidemia   . Hypertension   . Hypothyroidism   . Osteoarthritis   . Peptic ulcer disease   . Pneumonia 2010  . Renal insufficiency   . 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 Stroke ([place]) 1990s   mild  . TIA (transient  ischemic attack)     Past Surgical History:  Procedure Laterality Date  . ABDOMINAL HYSTERECTOMY    . APPENDECTOMY    . BACK SURGERY    . BONE MARROW ASPIRATION Left 04/22/14  . BONE MARROW BIOPSY Left 04/22/14  . CHOLECYSTECTOMY    . COLONOSCOPY     ?2005, [place]  . COLONOSCOPY N/A 04/07/2015   GMW:NUUVOZ  . COLONOSCOPY WITH ESOPHAGOGASTRODUODENOSCOPY (EGD)  02/11/2012   RMR: Ulcerative/erosive reflux esophagitis, benign appearing gastric ulcer with unremarkable biopsy, no H pylori. Colonoscopy was unremarkable. Next colonoscopy in 2023  . CYSTECTOMY     cyst removed from rectal area.   . ESOPHAGOGASTRODUODENOSCOPY  01/05/2003   RMR: Normal esophagus, small hiatal hernia/ A couple of tiny antral erosions, otherwise normal stomach normal D1 and D2.   . ESOPHAGOGASTRODUODENOSCOPY N/A 08/11/2014   DGU:YQIHKVQ ulcer/HH  . ESOPHAGOGASTRODUODENOSCOPY N/A 12/01/2014   QVZ:[id number] improved gastric ulcerations/p bx  . ESOPHAGOGASTRODUODENOSCOPY N/A 01/31/2016   Procedure: ESOPHAGOGASTRODUODENOSCOPY (EGD);  Surgeon: Daneil Dolin, MD;  Location: [facility];  Service: Endoscopy;  Laterality: N/A;  745  . HEMORRHOID SURGERY    . JOINT REPLACEMENT Right    knee  . MALONEY DILATION N/A 01/31/2016   Procedure: Venia Minks DILATION;  Surgeon: Daneil Dolin, MD;  Location: [facility] SU[facility];  Service: Endoscopy;  Laterality: N/A;    Family History  Problem Relation  Age of Onset  . Other Mother        died age 33, natural causes  . Stroke Mother   . Diabetes Daughter   . Healthy Son   . Healthy Daughter   . Healthy Daughter   . Colon cancer Neg Hx   . Liver disease Neg Hx     Social History Social History   Tobacco Use  . Smoking status: Current Every Day Smoker    Packs/day: 1.50    Years: 58.00    Pack years: 87.00    Types: Cigarettes  . Smokeless tobacco: Never Used  Substance Use Topics  . Alcohol use: No    Alcohol/week: 0.0 standard drinks  . Drug use: No     Allergies  Allergen Reactions  . Tetracyclines & Related   . Shellfish-Derived Products Nausea And Vomiting    Current Outpatient Medications  Medication Sig Dispense Refill  . albuterol (PROVENTIL HFA;VENTOLIN HFA) 108 (90 Base) MCG/ACT inhaler Inhale 2 puffs into the lungs every 4 (four) hours as needed for wheezing or shortness of breath. 1 Inhaler 3  . allopurinol (ZYLOPRIM) 100 MG tablet Take 100 mg by mouth as needed.   1  . aspirin EC 81 MG EC tablet Take 2 tablets (162 mg total) by mouth daily. 60 tablet 0  . budesonide-formoterol (SYMBICORT) 160-4.5 MCG/ACT inhaler Inhale 2 puffs into the lungs 2 (two) times daily as needed (shortness of breath).     . carvedilol (COREG) 12.5 MG tablet 12.5 mg 2 (two) times daily with a meal.     . Cholecalciferol (VITAMIN D-3) 5000 UNITS TABS Take 1 tablet by mouth daily.    Marland Kitchen DEXILANT 60 MG capsule Take 60 mg by mouth daily.     Marland Kitchen diltiazem (TIAZAC) 360 MG 24 hr capsule Take 360 mg by mouth daily.     Marland Kitchen ELIQUIS 5 MG TABS tablet Take 5 mg by mouth 2 (two) times daily.     . furosemide (LASIX) 20 MG tablet Take 1 tablet (20 mg total) by mouth every other day. 30 tablet 2  . gabapentin (NEURONTIN) 300 MG capsule 300 mg 3 (three) times daily.     . hydrOXYzine (ATARAX/VISTARIL) 25 MG tablet Take 25 mg by mouth every 4 (four) hours as needed for itching.   0  . levothyroxine (SYNTHROID, LEVOTHROID) 150 MCG tablet Take 1 tablet (150 mcg total) by mouth daily before breakfast for 30 days. 30 tablet 0  . oxyCODONE-acetaminophen (PERCOCET/ROXICET) 5-325 MG tablet Take 1 tablet by mouth every 6 (six) hours as needed for severe pain. 20 tablet 0  . potassium chloride SA (K-DUR,KLOR-CON) 20 MEQ tablet Take 1 tablet (20 mEq total) by mouth every other day.    . pravastatin (PRAVACHOL) 40 MG tablet Take 40 mg by mouth daily.    . ranitidine (ZANTAC) 150 MG tablet Take 150 mg by mouth at bedtime.     . sertraline (ZOLOFT) 50 MG tablet     . SPIRIVA  HANDIHALER 18 MCG inhalation capsule Place 18 mcg into inhaler and inhale as needed.   3  . traZODone (DESYREL) 150 MG tablet   5   No current facility-administered medications for this visit.      Physical Exam  Blood pressure (!) 141/73, pulse 66, height 5' 4"  (1.626 m), weight 198 lb (89.8 kg).  Constitutional: overall normal hygiene, normal nutrition, well developed, normal grooming, normal body habitus. Assistive device:cane  Musculoskeletal: gait and station Limp right, muscle tone  and strength are normal, no tremors or atrophy is present.  .  Neurological: coordination overall normal.  Deep tendon reflex/nerve stretch intact.  Sensation normal.  Cranial nerves II-XII intact.   Skin:   Normal overall no scars except right knee, lesions, ulcers or rashes. No psoriasis.  Psychiatric: Alert and oriented x 3.  Recent memory intact, remote memory unclear.  Normal mood and affect. Well groomed.  Good eye contact.  Cardiovascular: overall no swelling, no varicosities, no edema bilaterally, normal temperatures of the legs and arms, no clubbing, cyanosis and good capillary refill.  Lymphatic: palpation is normal.  Right knee has well healed anterior scar, ROM 0 to 115, stable, no effusion, NV intact, slight limp.  She has some medial tenderness just below the joint line.    The left foot has slight tenderness over the first metatarsal head, ROM is decreased. She has a post op shoe.  NV intact.  All other systems reviewed and are negative   The patient has been educated about the nature of the problem(s) and counseled on treatment options.  The patient appeared to understand what I have discussed and is in agreement with it.  Encounter Diagnoses  Name Primary?  . Acute pain of right knee Yes  . Pain of joint of left ankle and foot   . Closed nondisplaced fracture of first metatarsal bone of left foot, initial encounter    X-rays were done of the right knee and left foot, reported  separately.  PLAN Call if any problems.  Precautions discussed.  Continue current medications.   Return to clinic 2 weeks   I have also recommended Aspercreme to the knee area as needed.  Electronically Signed Sanjuana Kava, MD 1/29/202011:17 AM

## 2018-04-20 ENCOUNTER — Ambulatory Visit (INDEPENDENT_AMBULATORY_CARE_PROVIDER_SITE_OTHER): Payer: Medicare Other | Admitting: Otolaryngology

## 2018-04-20 DIAGNOSIS — R1312 Dysphagia, oropharyngeal phase: Secondary | ICD-10-CM

## 2018-04-20 DIAGNOSIS — R490 Dysphonia: Secondary | ICD-10-CM

## 2018-04-20 DIAGNOSIS — F17210 Nicotine dependence, cigarettes, uncomplicated: Secondary | ICD-10-CM | POA: Diagnosis not present

## 2018-04-29 ENCOUNTER — Ambulatory Visit (INDEPENDENT_AMBULATORY_CARE_PROVIDER_SITE_OTHER): Payer: Medicare Other | Admitting: Orthopaedic Surgery

## 2018-04-29 ENCOUNTER — Ambulatory Visit (INDEPENDENT_AMBULATORY_CARE_PROVIDER_SITE_OTHER): Payer: Medicare Other

## 2018-04-29 ENCOUNTER — Encounter: Payer: Self-pay | Admitting: Orthopaedic Surgery

## 2018-04-29 DIAGNOSIS — S92315D Nondisplaced fracture of first metatarsal bone, left foot, subsequent encounter for fracture with routine healing: Secondary | ICD-10-CM | POA: Diagnosis not present

## 2018-04-29 NOTE — Progress Notes (Signed)
CC:  My foot is better  She is using a post op shoe.  She has little pain now.  NV intact.  She has no swelling or redness.  X-rays were done of the left foot, reported separately.  Encounter Diagnosis  Name Primary?  . Closed nondisplaced fracture of first metatarsal bone of left foot with routine healing, subsequent encounter Yes   She can come out of the post op shoe and see how it feels.  Return in three weeks.  X-rays on return.  Call if any problem.  Precautions discussed.   Electronically Signed Darreld Mclean, MD 2/12/20209:54 AM

## 2018-05-05 ENCOUNTER — Other Ambulatory Visit: Payer: Self-pay | Admitting: Obstetrics & Gynecology

## 2018-05-07 ENCOUNTER — Other Ambulatory Visit: Payer: Self-pay | Admitting: Gastroenterology

## 2018-05-20 ENCOUNTER — Ambulatory Visit (INDEPENDENT_AMBULATORY_CARE_PROVIDER_SITE_OTHER): Payer: Medicare Other | Admitting: Orthopaedic Surgery

## 2018-05-20 ENCOUNTER — Ambulatory Visit (INDEPENDENT_AMBULATORY_CARE_PROVIDER_SITE_OTHER): Payer: Medicare Other

## 2018-05-20 ENCOUNTER — Encounter: Payer: Self-pay | Admitting: Orthopaedic Surgery

## 2018-05-20 VITALS — BP 103/66 | HR 69 | Ht 64.0 in | Wt 198.0 lb

## 2018-05-20 DIAGNOSIS — S92315D Nondisplaced fracture of first metatarsal bone, left foot, subsequent encounter for fracture with routine healing: Secondary | ICD-10-CM | POA: Diagnosis not present

## 2018-05-20 NOTE — Progress Notes (Signed)
She is doing well with the left foot.  She has normal gait and no pain.  X-rays were done, reported separately.  Encounter Diagnosis  Name Primary?  . Closed nondisplaced fracture of first metatarsal bone of left foot with routine healing, subsequent encounter Yes   I will see her as needed.  Discharge.  Call if any problem.  Precautions discussed.   Electronically Signed Darreld Mclean, MD 3/4/202010:06 AM

## 2018-06-02 ENCOUNTER — Encounter: Payer: Self-pay | Admitting: Internal Medicine

## 2018-07-05 ENCOUNTER — Other Ambulatory Visit: Payer: Self-pay

## 2018-07-05 ENCOUNTER — Encounter (HOSPITAL_COMMUNITY): Payer: Self-pay

## 2018-07-05 ENCOUNTER — Emergency Department (HOSPITAL_COMMUNITY)
Admission: EM | Admit: 2018-07-05 | Discharge: 2018-07-05 | Disposition: A | Discharge status: Home / Self Care | Payer: Medicare Other | Attending: Emergency Medicine | Admitting: Emergency Medicine

## 2018-07-05 DIAGNOSIS — L0291 Cutaneous abscess, unspecified: Secondary | ICD-10-CM

## 2018-07-05 DIAGNOSIS — E876 Hypokalemia: Secondary | ICD-10-CM | POA: Diagnosis not present

## 2018-07-05 DIAGNOSIS — Z79899 Other long term (current) drug therapy: Secondary | ICD-10-CM | POA: Diagnosis not present

## 2018-07-05 DIAGNOSIS — I129 Hypertensive chronic kidney disease with stage 1 through stage 4 chronic kidney disease, or unspecified chronic kidney disease: Secondary | ICD-10-CM | POA: Insufficient documentation

## 2018-07-05 DIAGNOSIS — J449 Chronic obstructive pulmonary disease, unspecified: Secondary | ICD-10-CM | POA: Diagnosis not present

## 2018-07-05 DIAGNOSIS — L02214 Cutaneous abscess of groin: Secondary | ICD-10-CM | POA: Diagnosis not present

## 2018-07-05 DIAGNOSIS — E039 Hypothyroidism, unspecified: Secondary | ICD-10-CM | POA: Diagnosis not present

## 2018-07-05 DIAGNOSIS — Z7982 Long term (current) use of aspirin: Secondary | ICD-10-CM | POA: Diagnosis not present

## 2018-07-05 DIAGNOSIS — Z7901 Long term (current) use of anticoagulants: Secondary | ICD-10-CM | POA: Insufficient documentation

## 2018-07-05 DIAGNOSIS — L02811 Cutaneous abscess of head [any part, except face]: Secondary | ICD-10-CM | POA: Diagnosis not present

## 2018-07-05 DIAGNOSIS — N183 Chronic kidney disease, stage 3 (moderate): Secondary | ICD-10-CM | POA: Diagnosis not present

## 2018-07-05 DIAGNOSIS — Z8673 Personal history of transient ischemic attack (TIA), and cerebral infarction without residual deficits: Secondary | ICD-10-CM | POA: Diagnosis not present

## 2018-07-05 DIAGNOSIS — N289 Disorder of kidney and ureter, unspecified: Secondary | ICD-10-CM

## 2018-07-05 DIAGNOSIS — F17210 Nicotine dependence, cigarettes, uncomplicated: Secondary | ICD-10-CM | POA: Insufficient documentation

## 2018-07-05 DIAGNOSIS — R220 Localized swelling, mass and lump, head: Secondary | ICD-10-CM | POA: Diagnosis present

## 2018-07-05 LAB — CBC WITH DIFFERENTIAL/PLATELET
Abs Immature Granulocytes: 0.09 10*3/uL — ABNORMAL HIGH (ref 0.00–0.07)
Basophils Absolute: 0.1 10*3/uL (ref 0.0–0.1)
Basophils Relative: 0 %
Eosinophils Absolute: 0.6 10*3/uL — ABNORMAL HIGH (ref 0.0–0.5)
Eosinophils Relative: 5 %
HCT: 32.2 % — ABNORMAL LOW (ref 36.0–46.0)
Hemoglobin: 9.8 g/dL — ABNORMAL LOW (ref 12.0–15.0)
Immature Granulocytes: 1 %
Lymphocytes Relative: 15 %
Lymphs Abs: 1.8 10*3/uL (ref 0.7–4.0)
MCH: 24.8 pg — ABNORMAL LOW (ref 26.0–34.0)
MCHC: 30.4 g/dL (ref 30.0–36.0)
MCV: 81.5 fL (ref 80.0–100.0)
Monocytes Absolute: 1.1 10*3/uL — ABNORMAL HIGH (ref 0.1–1.0)
Monocytes Relative: 9 %
Neutro Abs: 8.4 10*3/uL — ABNORMAL HIGH (ref 1.7–7.7)
Neutrophils Relative %: 70 %
Platelets: 222 10*3/uL (ref 150–400)
RBC: 3.95 MIL/uL (ref 3.87–5.11)
RDW: 15.7 % — ABNORMAL HIGH (ref 11.5–15.5)
WBC: 12 10*3/uL — ABNORMAL HIGH (ref 4.0–10.5)
nRBC: 0 % (ref 0.0–0.2)

## 2018-07-05 LAB — BASIC METABOLIC PANEL
Anion gap: 10 (ref 5–15)
BUN: 17 mg/dL (ref 8–23)
CO2: 28 mmol/L (ref 22–32)
Calcium: 9.8 mg/dL (ref 8.9–10.3)
Chloride: 97 mmol/L — ABNORMAL LOW (ref 98–111)
Creatinine, Ser: 1.14 mg/dL — ABNORMAL HIGH (ref 0.44–1.00)
GFR calc Af Amer: 54 mL/min — ABNORMAL LOW (ref >60)
GFR calc non Af Amer: 47 mL/min — ABNORMAL LOW (ref >60)
Glucose, Bld: 103 mg/dL — ABNORMAL HIGH (ref 70–99)
Potassium: 3.4 mmol/L — ABNORMAL LOW (ref 3.5–5.1)
Sodium: 135 mmol/L (ref 135–145)

## 2018-07-05 MED ORDER — POVIDONE-IODINE 10 % EX SOLN
CUTANEOUS | Status: AC
  Filled 2018-07-05: qty 15

## 2018-07-05 MED ORDER — POVIDONE-IODINE 10 % EX SOLN
CUTANEOUS | Status: AC
  Filled 2018-07-05: qty 30

## 2018-07-05 MED ORDER — LIDOCAINE HCL (PF) 1 % IJ SOLN: 10.0000 mL | Freq: Once | INTRAMUSCULAR | Status: DC

## 2018-07-05 MED ORDER — LIDOCAINE-EPINEPHRINE (PF) 2 %-1:200000 IJ SOLN
INTRAMUSCULAR | Status: AC
  Filled 2018-07-05: qty 10

## 2018-07-05 MED ORDER — AMOXICILLIN-POT CLAVULANATE 875-125 MG PO TABS: 1.0000 | ORAL_TABLET | Freq: Two times a day (BID) | ORAL | 0 refills | Status: DC

## 2018-07-05 MED ORDER — AMOXICILLIN-POT CLAVULANATE 875-125 MG PO TABS
1.0000 | ORAL_TABLET | Freq: Once | ORAL | Status: AC
  Administered 2018-07-05: 15:00:00 1 via ORAL
  Filled 2018-07-05: qty 1

## 2018-07-05 MED ORDER — POTASSIUM CHLORIDE CRYS ER 20 MEQ PO TBCR
40.0000 meq | EXTENDED_RELEASE_TABLET | Freq: Once | ORAL | Status: AC
  Administered 2018-07-05: 40 meq via ORAL
  Filled 2018-07-05: qty 2

## 2018-07-05 MED ORDER — LIDOCAINE HCL (PF) 1 % IJ SOLN
INTRAMUSCULAR | Status: AC
  Administered 2018-07-05: 15:00:00
  Filled 2018-07-05: qty 2

## 2018-07-05 NOTE — ED Notes (Signed)
Pt noted to have to abscess areas one to head and one left groin area. No drainage noted . Surrounding tissue is hard, and red

## 2018-07-05 NOTE — Discharge Instructions (Addendum)
To help treat the wounds we are prescribing an antibiotic.  Please start taking it tomorrow morning.  Additionally, to help the wounds, use moist compresses on them or soak in a warm tub 3 or 4 times a day.  This can help the wounds drain more, and heal better.  Your potassium level was mildly low today.  Continue taking your potassium supplementation.  Also try to eat some foods which contain more potassium to improve your potassium level.  You may be mildly dehydrated.  Try drinking an extra 2 to 3 glasses of water each day.  Your hemoglobin level is somewhat low but has been at this level for some time.  Your doctor may need to prescribe iron, to treat it.  For now make sure you are eating a regular diet, to see if that helps.  We cannot find an explanation for your preference to eat ice.

## 2018-07-05 NOTE — ED Provider Notes (Signed)
[facility] EMERGENCY DEPARTMENT Provider Note   CSN: [id number] Arrival date & time: 07/05/18  1331    History   Chief Complaint Chief Complaint  Patient presents with  . Abscess    HPI Connie Ponce is a 73 y.o. female.     HPI  She complains of a recurrent nodule on her left head, present for 2 days, and a new nodule in her left groin present for 2 days.  She denies fever, chills, nausea, weakness or dizziness.  She is ambulating easily.  She came here by private vehicle for evaluation.  No known sick contacts.  She complains of eating ice, for 1 month, and does not know why.  There are no other known modifying factors.   Past Medical History:  Diagnosis Date  . Anxiety   . Carpal tunnel syndrome   . COPD (chronic obstructive pulmonary disease) ([place])   . Gastric ulcer   . GERD (gastroesophageal reflux disease)   . Hyperglycemia   . Hyperlipidemia   . Hypertension   . Hypothyroidism   . Osteoarthritis   . Peptic ulcer disease   . Pneumonia 2010  . Renal insufficiency   . 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 Stroke ([place]) 1990s   mild  . TIA (transient ischemic attack)     Patient Active Problem List   Diagnosis Date Noted  . Syncope and collapse 03/28/2018  . COPD (chronic obstructive pulmonary disease) ([place]) 03/28/2018  . Tobacco abuse 03/28/2018  . Fall at home 03/28/2018  . CKD (chronic kidney disease) stage 3, GFR 30-59 ml/min (HCC) 03/28/2018  . Anemia in chronic kidney disease (CKD) 03/28/2018  . Hyperlipidemia 03/28/2018  . Acute metabolic encephalopathy 30/16/0109  . Gait instability 10/03/2017  . Dysarthria 10/03/2017  . Essential hypertension 10/03/2017  . Slurred speech   . Abdominal mass, RUQ (right upper quadrant) 03/13/2017  . Abdominal pain 03/13/2017  . RUQ pain 03/13/2017  . Dysphagia   . TIA (transient ischemic attack) 01/03/2016  . Absolute anemia 07/03/2015  . Proctalgia   . Rectal pain 01/04/2015  . Hemorrhoids 01/04/2015  .  Diarrhea 01/04/2015  . Gastric ulceration   . Gastric ulcer 11/29/2014  . Toxic metabolic encephalopathy 32/35/5732  . UTI (urinary tract infection) 09/19/2014  . ARF (acute renal failure) ([place])   . LUQ pain 07/28/2014  . Nausea with vomiting 07/28/2014  . Bilateral lower extremity edema 07/28/2014  . Thrombocytopenia ([place]) 04/12/2014  . RLQ abdominal pain 01/22/2012  . Constipation 01/22/2012  . GERD (gastroesophageal reflux disease) 01/22/2012    Past Surgical History:  Procedure Laterality Date  . ABDOMINAL HYSTERECTOMY    . APPENDECTOMY    . BACK SURGERY    . BONE MARROW ASPIRATION Left 04/22/14  . BONE MARROW BIOPSY Left 04/22/14  . CHOLECYSTECTOMY    . COLONOSCOPY     ?2005, [place]  . COLONOSCOPY N/A 04/07/2015   KGU:[id number]  . COLONOSCOPY WITH ESOPHAGOGASTRODUODENOSCOPY (EGD)  02/11/2012   RMR: Ulcerative/erosive reflux esophagitis, benign appearing gastric ulcer with unremarkable biopsy, no H pylori. Colonoscopy was unremarkable. Next colonoscopy in 2023  . CYSTECTOMY     cyst removed from rectal area.   . ESOPHAGOGASTRODUODENOSCOPY  01/05/2003   RMR: Normal esophagus, small hiatal hernia/ A couple of tiny antral erosions, otherwise normal stomach normal D1 and D2.   . ESOPHAGOGASTRODUODENOSCOPY N/A 08/11/2014   CBJ:SEGBTDV ulcer/HH  . ESOPHAGOGASTRODUODENOSCOPY N/A 12/01/2014   VOH:[id number] improved gastric ulcerations/p bx  . ESOPHAGOGASTRODUODENOSCOPY N/A 01/31/2016  Procedure: ESOPHAGOGASTRODUODENOSCOPY (EGD);  Surgeon: Daneil Dolin, MD;  Location: [facility] ENDO SUITE;  Service: Endoscopy;  Laterality: N/A;  745  . HEMORRHOID SURGERY    . JOINT REPLACEMENT Right    knee  . MALONEY DILATION N/A 01/31/2016   Procedure: Venia Minks DILATION;  Surgeon: Daneil Dolin, MD;  Location: [facility] ENDO SUITE;  Service: Endoscopy;  Laterality: N/A;     OB History    Gravida  4   Para      Term      Preterm      AB      Living  3     SAB      TAB       Ectopic      Multiple      Live Births               Home Medications    Prior to Admission medications   Medication Sig Start Date End Date Taking? Authorizing Provider  albuterol (PROVENTIL HFA;VENTOLIN HFA) 108 (90 Base) MCG/ACT inhaler Inhale 2 puffs into the lungs every 4 (four) hours as needed for wheezing or shortness of breath. 08/10/17   Noemi Chapel, MD  allopurinol (ZYLOPRIM) 100 MG tablet Take 100 mg by mouth as needed.  01/20/18   [provider]  amoxicillin-clavulanate (AUGMENTIN) 875-125 MG tablet Take 1 tablet by mouth 2 (two) times daily. One po bid x 7 days 07/05/18   Daleen Bo, MD  aspirin EC 81 MG EC tablet Take 2 tablets (162 mg total) by mouth daily. 01/05/16   Thurnell Lose, MD  budesonide-formoterol (SYMBICORT) 160-4.5 MCG/ACT inhaler Inhale 2 puffs into the lungs 2 (two) times daily as needed (shortness of breath).     [provider]  carvedilol (COREG) 12.5 MG tablet 12.5 mg 2 (two) times daily with a meal.  07/08/17   [provider]  Cholecalciferol (VITAMIN D-3) 5000 UNITS TABS Take 1 tablet by mouth daily.    [provider]  DEXILANT 60 MG capsule Take 60 mg by mouth daily.  01/29/18   [provider]  diltiazem (TIAZAC) 360 MG 24 hr capsule Take 360 mg by mouth daily.  01/29/18   [provider]  ELIQUIS 5 MG TABS tablet Take 5 mg by mouth 2 (two) times daily.  01/29/18   [provider]  furosemide (LASIX) 20 MG tablet Take 1 tablet (20 mg total) by mouth every other day. 04/03/18   Johnson, Clanford L, MD  gabapentin (NEURONTIN) 300 MG capsule 300 mg 3 (three) times daily.  07/08/17   [provider]  hydrOXYzine (ATARAX/VISTARIL) 25 MG tablet Take 25 mg by mouth every 4 (four) hours as needed for itching.  08/13/17   [provider]  levothyroxine (SYNTHROID, LEVOTHROID) 150 MCG tablet Take 1 tablet (150 mcg total) by mouth daily before breakfast for 30 days. 03/31/18  04/30/18  Johnson, Clanford L, MD  MOVANTIK 12.5 MG TABS tablet TAKE 1 TABLET BY MOUTH ONCE DAILY. 05/08/18   Mahala Menghini, PA-C  oxybutynin (DITROPAN) 5 MG tablet TAKE (1) TABLET BY MOUTH THREE TIMES DAILY 05/05/18   Florian Buff, MD  oxyCODONE-acetaminophen (PERCOCET/ROXICET) 5-325 MG tablet Take 1 tablet by mouth every 6 (six) hours as needed for severe pain. 03/31/18   Johnson, Clanford L, MD  potassium chloride SA (K-DUR,KLOR-CON) 20 MEQ tablet Take 1 tablet (20 mEq total) by mouth every other day. 04/03/18   Murlean Iba, MD  pravastatin (PRAVACHOL)  40 MG tablet Take 40 mg by mouth daily.    [provider]  ranitidine (ZANTAC) 150 MG tablet Take 150 mg by mouth at bedtime.  12/08/14   [provider]  sertraline (ZOLOFT) 50 MG tablet  07/08/17   [provider]  SPIRIVA HANDIHALER 18 MCG inhalation capsule Place 18 mcg into inhaler and inhale as needed.  08/19/17   [provider]  traZODone (DESYREL) 150 MG tablet  10/01/17   [provider]    Family History Family History  Problem Relation Age of Onset  . Other Mother        died age 32, natural causes  . Stroke Mother   . Diabetes Daughter   . Healthy Son   . Healthy Daughter   . Healthy Daughter   . Colon cancer Neg Hx   . Liver disease Neg Hx     Social History Social History   Tobacco Use  . Smoking status: Current Every Day Smoker    Packs/day: 1.50    Years: 58.00    Pack years: 87.00    Types: Cigarettes  . Smokeless tobacco: Never Used  Substance Use Topics  . Alcohol use: No    Alcohol/week: 0.0 standard drinks  . Drug use: No     Allergies   Tetracyclines & related and Shellfish-derived products   Review of Systems Review of Systems  All other systems reviewed and are negative.    Physical Exam Updated Vital Signs BP (!) 156/82 (BP Location: Right Wrist)   Pulse 91   Temp 98.6 F (37 C) (Oral)   Resp 18   Ht _0  (1.626 m)   Wt 102.1 kg    SpO2 98%   BMI 38.62 kg/m   Physical Exam Vitals signs and nursing note reviewed.  Constitutional:      General: She is not in acute distress.    Appearance: She is well-developed. She is obese. She is not ill-appearing or diaphoretic.  HENT:     Head: Normocephalic and atraumatic.     Comments: Left parietal scalp lesion, see skin exam.    Right Ear: External ear normal.     Left Ear: External ear normal.     Nose: No rhinorrhea.  Eyes:     Conjunctiva/sclera: Conjunctivae normal.     Pupils: Pupils are equal, round, and reactive to light.  Neck:     Musculoskeletal: Normal range of motion and neck supple.     Trachea: Phonation normal.  Cardiovascular:     Rate and Rhythm: Normal rate and regular rhythm.     Heart sounds: Normal heart sounds.  Pulmonary:     Effort: Pulmonary effort is normal.     Breath sounds: Normal breath sounds.  Abdominal:     Palpations: Abdomen is soft.     Tenderness: There is no abdominal tenderness.  Musculoskeletal: Normal range of motion.  Skin:    General: Skin is warm and dry.     Comments: She has 2 separate skin abscesses.  The left parietal scalp with a 1.5 cm fluctuant area with central pointing, and a small amount of bleeding.  This lesion is tender.  Left groin with 3 x 3 cm tender raised area with surrounding erythema, and central pointing, with a small amount of bleeding and drainage.  This area is also tender.  Neurological:     Mental Status: She is alert and oriented to person, place, and time.     Cranial  Nerves: No cranial nerve deficit.     Sensory: No sensory deficit.     Motor: No abnormal muscle tone.     Coordination: Coordination normal.  Psychiatric:        Mood and Affect: Mood normal.        Behavior: Behavior normal.        Thought Content: Thought content normal.        Judgment: Judgment normal.      ED Treatments / Results  Labs (all labs ordered are listed, but only abnormal results are displayed) Labs  Reviewed  CBC WITH DIFFERENTIAL/PLATELET - Abnormal; Notable for the following components:      Result Value   WBC 12.0 (*)    Hemoglobin 9.8 (*)    HCT 32.2 (*)    MCH 24.8 (*)    RDW 15.7 (*)    Neutro Abs 8.4 (*)    Monocytes Absolute 1.1 (*)    Eosinophils Absolute 0.6 (*)    Abs Immature Granulocytes 0.09 (*)    All other components within normal limits  BASIC METABOLIC PANEL - Abnormal; Notable for the following components:   Potassium 3.4 (*)    Chloride 97 (*)    Glucose, Bld 103 (*)    Creatinine, Ser 1.14 (*)    GFR calc non Af Amer 47 (*)    GFR calc Af Amer 54 (*)    All other components within normal limits    EKG None  Radiology No results found.  Procedures .Marland KitchenIncision and Drainage Date/Time: 07/05/2018 2:45 PM Performed by: Daleen Bo, MD Authorized by: Daleen Bo, MD   Consent:    Consent obtained:  Verbal   Consent given by:  Patient   Risks discussed:  Bleeding, incomplete drainage and infection   Alternatives discussed:  No treatment Location:    Type:  Abscess   Size:  3 cm   Location: groin. Pre-procedure details:    Skin preparation:  Betadine Anesthesia (see MAR for exact dosages):    Anesthesia method:  Local infiltration   Local anesthetic:  Lidocaine 2% WITH epi Procedure type:    Complexity:  Simple Procedure details:    Needle aspiration: no     Incision types:  Single straight   Incision depth:  Subcutaneous   Scalpel blade:  11   Wound management:  Probed and deloculated, irrigated with saline and extensive cleaning   Drainage:  Purulent   Drainage amount:  Moderate   Wound treatment:  Wound left open   Packing materials:  None Post-procedure details:    Patient tolerance of procedure:  Tolerated well, no immediate complications .Marland KitchenIncision and Drainage Date/Time: 07/05/2018 2:46 PM Performed by: Daleen Bo, MD Authorized by: Daleen Bo, MD   Consent:    Consent obtained:  Verbal   Consent given by:   Patient   Risks discussed:  Bleeding and incomplete drainage   Alternatives discussed:  No treatment Location:    Type:  Abscess   Size:  1 cm   Location:  Head   Head location:  Scalp Pre-procedure details:    Skin preparation:  Betadine Anesthesia (see MAR for exact dosages):    Anesthesia method:  Local infiltration   Local anesthetic:  Lidocaine 2% WITH epi Procedure type:    Complexity:  Simple Procedure details:    Needle aspiration: yes     Incision types:  Single straight   Incision depth:  Subcutaneous   Scalpel blade:  11   Wound management:  Probed and deloculated   Drainage:  Purulent   Drainage amount:  Scant   Wound treatment:  Wound left open   Packing materials:  None Post-procedure details:    Patient tolerance of procedure:  Tolerated well, no immediate complications   (including critical care time)  Medications Ordered in ED Medications  lidocaine (PF) (XYLOCAINE) 1 % injection 10 mL (has no administration in time range)  lidocaine-EPINEPHrine (XYLOCAINE W/EPI) 2 %-1:200000 (PF) injection (has no administration in time range)  povidone-iodine (BETADINE) 10 % external solution (has no administration in time range)  povidone-iodine (BETADINE) 10 % external solution (has no administration in time range)  lidocaine (PF) (XYLOCAINE) 1 % injection (  Given by Other 07/05/18 1450)  amoxicillin-clavulanate (AUGMENTIN) 875-125 MG per tablet 1 tablet (1 tablet Oral Given 07/05/18 1526)  potassium chloride SA (K-DUR) CR tablet 40 mEq (40 mEq Oral Given 07/05/18 1529)     Initial Impression / Assessment and Plan / ED Course  I have reviewed the triage vital signs and the nursing notes.  Pertinent labs & imaging results that were available during my care of the patient were reviewed by me and considered in my medical decision making (see chart for details).  Clinical Course as of Jul 04 1533  Sun Jul 05, 2018  1524 Normal except potassium low, chloride low, glucose  high, creatinine high, GFR low  Basic metabolic panel(!) [EW]  7412 Normal except white count high, hemoglobin low  CBC with Differential(!) [EW]    Clinical Course User Index [EW] Daleen Bo, MD        Patient Vitals for the past 24 hrs:  BP Temp Temp src Pulse Resp SpO2 Height Weight  07/05/18 1532 (!) 156/82 98.6 F (37 C) Oral 91 18 98 % - -  07/05/18 1341 (!) 143/65 98.6 F (37 C) Oral 82 20 98 % - -  07/05/18 1339 - - - - - - _0  (1.626 m) 102.1 kg    3:27 PM Reevaluation with update and discussion. After initial assessment and treatment, an updated evaluation reveals no change in status. Findings discussed and questions answered. Daleen Bo   Medical Decision Making: Skin abscesses, x2, recurrent.  Screening tests initiated because of complaint of pica.  Mild metabolic abnormalities, which can be treated symptomatically.  She is currently on furosemide, and potassium.  She does not require hospitalization for hypokalemia.  Hemoglobin low, stable, with normal MCV.  No indication for transfusion.  Doubt serious bacterial infection, metabolic instability or impending vascular collapse.  CRITICAL CARE-no Performed by: Daleen Bo  Nursing Notes Reviewed/ Care Coordinated Applicable Imaging Reviewed Interpretation of Laboratory Data incorporated into ED treatment  The patient appears reasonably screened and/or stabilized for discharge and I doubt any other medical condition or other [facility] requiring further screening, evaluation, or treatment in the ED at this time prior to discharge.  Plan: Home Medications- usual; Home Treatments-warm compresses to affected areas which were I&D today, 3 4 times a day.  Rest, fluids.; return here if the recommended treatment, does not improve the symptoms; Recommended follow up-PCP 1 to 2 weeks for checkup.   Final Clinical Impressions(s) / ED Diagnoses   Final diagnoses:  Abscess  Hypokalemia  Renal insufficiency    ED  Discharge Orders         Ordered    amoxicillin-clavulanate (AUGMENTIN) 875-125 MG tablet  2 times daily     07/05/18 1531           Ramond Darnell,  Vira Agar, MD 07/05/18 1535

## 2018-07-05 NOTE — ED Triage Notes (Signed)
Pt reports she has cysts on the back of her head and one in her left groin for 2 days

## 2018-07-06 ENCOUNTER — Other Ambulatory Visit: Payer: Self-pay | Admitting: Gastroenterology

## 2018-07-18 ENCOUNTER — Emergency Department (HOSPITAL_COMMUNITY)
Admission: EM | Admit: 2018-07-18 | Discharge: 2018-07-18 | Disposition: A | Discharge status: Home / Self Care | Payer: Medicare Other | Attending: Emergency Medicine | Admitting: Emergency Medicine

## 2018-07-18 ENCOUNTER — Other Ambulatory Visit: Payer: Self-pay

## 2018-07-18 ENCOUNTER — Encounter (HOSPITAL_COMMUNITY): Payer: Self-pay | Admitting: *Deleted

## 2018-07-18 DIAGNOSIS — Z8673 Personal history of transient ischemic attack (TIA), and cerebral infarction without residual deficits: Secondary | ICD-10-CM | POA: Insufficient documentation

## 2018-07-18 DIAGNOSIS — J449 Chronic obstructive pulmonary disease, unspecified: Secondary | ICD-10-CM | POA: Diagnosis not present

## 2018-07-18 DIAGNOSIS — Z96651 Presence of right artificial knee joint: Secondary | ICD-10-CM | POA: Diagnosis not present

## 2018-07-18 DIAGNOSIS — L309 Dermatitis, unspecified: Secondary | ICD-10-CM

## 2018-07-18 DIAGNOSIS — E039 Hypothyroidism, unspecified: Secondary | ICD-10-CM | POA: Diagnosis not present

## 2018-07-18 DIAGNOSIS — Z7982 Long term (current) use of aspirin: Secondary | ICD-10-CM | POA: Diagnosis not present

## 2018-07-18 DIAGNOSIS — L259 Unspecified contact dermatitis, unspecified cause: Secondary | ICD-10-CM | POA: Insufficient documentation

## 2018-07-18 DIAGNOSIS — N183 Chronic kidney disease, stage 3 (moderate): Secondary | ICD-10-CM | POA: Insufficient documentation

## 2018-07-18 DIAGNOSIS — Z79899 Other long term (current) drug therapy: Secondary | ICD-10-CM | POA: Insufficient documentation

## 2018-07-18 DIAGNOSIS — F17210 Nicotine dependence, cigarettes, uncomplicated: Secondary | ICD-10-CM | POA: Diagnosis not present

## 2018-07-18 DIAGNOSIS — I129 Hypertensive chronic kidney disease with stage 1 through stage 4 chronic kidney disease, or unspecified chronic kidney disease: Secondary | ICD-10-CM | POA: Insufficient documentation

## 2018-07-18 DIAGNOSIS — R21 Rash and other nonspecific skin eruption: Secondary | ICD-10-CM | POA: Diagnosis present

## 2018-07-18 MED ORDER — DEXAMETHASONE SODIUM PHOSPHATE 4 MG/ML IJ SOLN
8.0000 mg | Freq: Once | INTRAMUSCULAR | Status: AC
  Administered 2018-07-18: 8 mg via INTRAMUSCULAR
  Filled 2018-07-18: qty 2

## 2018-07-18 MED ORDER — PREDNISONE 20 MG PO TABS: 40.0000 mg | ORAL_TABLET | Freq: Every day | ORAL | 0 refills | Status: DC

## 2018-07-18 MED ORDER — DIPHENHYDRAMINE HCL 25 MG PO CAPS
25.0000 mg | ORAL_CAPSULE | Freq: Once | ORAL | Status: AC
  Administered 2018-07-18: 25 mg via ORAL
  Filled 2018-07-18: qty 1

## 2018-07-18 NOTE — Discharge Instructions (Addendum)
Continue taking your benadryl, one capsule every 4-6 hrs as needed for itching.  Start the prednisone prescription tomorrow.  You may also try aveeno oatmeal baths or calamine lotion to help soothe the itching.  Follow-up with your primary provider for recheck or return here if needed

## 2018-07-18 NOTE — ED Provider Notes (Signed)
[facility] EMERGENCY DEPARTMENT Provider Note   CSN: [id number] Arrival date & time: 07/18/18  1122    History   Chief Complaint Chief Complaint  Patient presents with  . Rash    HPI Connie Ponce is a 73 y.o. female.     HPI   Connie Ponce is a 73 y.o. female who presents to the Emergency Department complaining of generalized rash and itching.  Symptoms have been present for 2 weeks.  She reports itching has been severe at times and wakes her up at night.  She has been taking benadryl without significant relief.  Denies swelling, shortness of breath, difficulty swallowing or swelling or her tongue or lips.  No other family members have similar symptoms.  She also denies new medications, soaps, lotions or detergents.  No recent illness.     Past Medical History:  Diagnosis Date  . Anxiety   . Carpal tunnel syndrome   . COPD (chronic obstructive pulmonary disease) ([place])   . Gastric ulcer   . GERD (gastroesophageal reflux disease)   . Hyperglycemia   . Hyperlipidemia   . Hypertension   . Hypothyroidism   . Osteoarthritis   . Peptic ulcer disease   . Pneumonia 2010  . Renal insufficiency   . 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 Stroke ([place]) 1990s   mild  . TIA (transient ischemic attack)     Patient Active Problem List   Diagnosis Date Noted  . Syncope and collapse 03/28/2018  . COPD (chronic obstructive pulmonary disease) ([place]) 03/28/2018  . Tobacco abuse 03/28/2018  . Fall at home 03/28/2018  . CKD (chronic kidney disease) stage 3, GFR 30-59 ml/min (HCC) 03/28/2018  . Anemia in chronic kidney disease (CKD) 03/28/2018  . Hyperlipidemia 03/28/2018  . Acute metabolic encephalopathy 50/27/7412  . Gait instability 10/03/2017  . Dysarthria 10/03/2017  . Essential hypertension 10/03/2017  . Slurred speech   . Abdominal mass, RUQ (right upper quadrant) 03/13/2017  . Abdominal pain 03/13/2017  . RUQ pain 03/13/2017  . Dysphagia   . TIA (transient ischemic attack)  01/03/2016  . Absolute anemia 07/03/2015  . Proctalgia   . Rectal pain 01/04/2015  . Hemorrhoids 01/04/2015  . Diarrhea 01/04/2015  . Gastric ulceration   . Gastric ulcer 11/29/2014  . Toxic metabolic encephalopathy 87/86/7672  . UTI (urinary tract infection) 09/19/2014  . ARF (acute renal failure) ([place])   . LUQ pain 07/28/2014  . Nausea with vomiting 07/28/2014  . Bilateral lower extremity edema 07/28/2014  . Thrombocytopenia ([place]) 04/12/2014  . RLQ abdominal pain 01/22/2012  . Constipation 01/22/2012  . GERD (gastroesophageal reflux disease) 01/22/2012    Past Surgical History:  Procedure Laterality Date  . ABDOMINAL HYSTERECTOMY    . APPENDECTOMY    . BACK SURGERY    . BONE MARROW ASPIRATION Left 04/22/14  . BONE MARROW BIOPSY Left 04/22/14  . CHOLECYSTECTOMY    . COLONOSCOPY     ?2005, [place]  . COLONOSCOPY N/A 04/07/2015   CNO:[id number]  . COLONOSCOPY WITH ESOPHAGOGASTRODUODENOSCOPY (EGD)  02/11/2012   RMR: Ulcerative/erosive reflux esophagitis, benign appearing gastric ulcer with unremarkable biopsy, no H pylori. Colonoscopy was unremarkable. Next colonoscopy in 2023  . CYSTECTOMY     cyst removed from rectal area.   . ESOPHAGOGASTRODUODENOSCOPY  01/05/2003   RMR: Normal esophagus, small hiatal hernia/ A couple of tiny antral erosions, otherwise normal stomach normal D1 and D2.   . ESOPHAGOGASTRODUODENOSCOPY N/A 08/11/2014   ZMO:QHUTMLY ulcer/HH  . ESOPHAGOGASTRODUODENOSCOPY N/A  12/01/2014   BCW:[id number] improved gastric ulcerations/p bx  . ESOPHAGOGASTRODUODENOSCOPY N/A 01/31/2016   Procedure: ESOPHAGOGASTRODUODENOSCOPY (EGD);  Surgeon: Daneil Dolin, MD;  Location: [facility];  Service: Endoscopy;  Laterality: N/A;  745  . HEMORRHOID SURGERY    . JOINT REPLACEMENT Right    knee  . MALONEY DILATION N/A 01/31/2016   Procedure: Venia Minks DILATION;  Surgeon: Daneil Dolin, MD;  Location: [facility] ENDO SUITE;  Service: Endoscopy;  Laterality: N/A;     OB  History    Gravida  4   Para      Term      Preterm      AB      Living  3     SAB      TAB      Ectopic      Multiple      Live Births               Home Medications    Prior to Admission medications   Medication Sig Start Date End Date Taking? Authorizing Provider  albuterol (PROVENTIL HFA;VENTOLIN HFA) 108 (90 Base) MCG/ACT inhaler Inhale 2 puffs into the lungs every 4 (four) hours as needed for wheezing or shortness of breath. 08/10/17   Noemi Chapel, MD  allopurinol (ZYLOPRIM) 100 MG tablet Take 100 mg by mouth as needed.  01/20/18   [provider]  amoxicillin-clavulanate (AUGMENTIN) 875-125 MG tablet Take 1 tablet by mouth 2 (two) times daily. One po bid x 7 days 07/05/18   Daleen Bo, MD  aspirin EC 81 MG EC tablet Take 2 tablets (162 mg total) by mouth daily. 01/05/16   Thurnell Lose, MD  budesonide-formoterol (SYMBICORT) 160-4.5 MCG/ACT inhaler Inhale 2 puffs into the lungs 2 (two) times daily as needed (shortness of breath).     [provider]  carvedilol (COREG) 12.5 MG tablet 12.5 mg 2 (two) times daily with a meal.  07/08/17   [provider]  Cholecalciferol (VITAMIN D-3) 5000 UNITS TABS Take 1 tablet by mouth daily.    [provider]  DEXILANT 60 MG capsule TAKE (1) CAPSULE BY MOUTH ONCE DAILY. 07/09/18   Carlis Stable, NP  diltiazem (TIAZAC) 360 MG 24 hr capsule Take 360 mg by mouth daily.  01/29/18   [provider]  ELIQUIS 5 MG TABS tablet Take 5 mg by mouth 2 (two) times daily.  01/29/18   [provider]  furosemide (LASIX) 20 MG tablet Take 1 tablet (20 mg total) by mouth every other day. 04/03/18   Johnson, Clanford L, MD  gabapentin (NEURONTIN) 300 MG capsule 300 mg 3 (three) times daily.  07/08/17   [provider]  hydrOXYzine (ATARAX/VISTARIL) 25 MG tablet Take 25 mg by mouth every 4 (four) hours as needed for itching.  08/13/17   [provider]  levothyroxine  (SYNTHROID, LEVOTHROID) 150 MCG tablet Take 1 tablet (150 mcg total) by mouth daily before breakfast for 30 days. 03/31/18 04/30/18  Johnson, Clanford L, MD  MOVANTIK 12.5 MG TABS tablet TAKE 1 TABLET BY MOUTH ONCE DAILY. 05/08/18   Mahala Menghini, PA-C  oxybutynin (DITROPAN) 5 MG tablet TAKE (1) TABLET BY MOUTH THREE TIMES DAILY 05/05/18   Florian Buff, MD  oxyCODONE-acetaminophen (PERCOCET/ROXICET) 5-325 MG tablet Take 1 tablet by mouth every 6 (six) hours as needed for severe pain. 03/31/18   Johnson, Clanford L, MD  potassium chloride SA (K-DUR,KLOR-CON) 20 MEQ tablet Take 1 tablet (20 mEq  total) by mouth every other day. 04/03/18   Johnson, Clanford L, MD  pravastatin (PRAVACHOL) 40 MG tablet Take 40 mg by mouth daily.    [provider]  predniSONE (DELTASONE) 20 MG tablet Take 2 tablets (40 mg total) by mouth daily. 07/18/18   Starr Urias, PA-C  ranitidine (ZANTAC) 150 MG tablet Take 150 mg by mouth at bedtime.  12/08/14   [provider]  sertraline (ZOLOFT) 50 MG tablet  07/08/17   [provider]  SPIRIVA HANDIHALER 18 MCG inhalation capsule Place 18 mcg into inhaler and inhale as needed.  08/19/17   [provider]  traZODone (DESYREL) 150 MG tablet  10/01/17   [provider]    Family History Family History  Problem Relation Age of Onset  . Other Mother        died age 23, natural causes  . Stroke Mother   . Diabetes Daughter   . Healthy Son   . Healthy Daughter   . Healthy Daughter   . Colon cancer Neg Hx   . Liver disease Neg Hx     Social History Social History   Tobacco Use  . Smoking status: Current Every Day Smoker    Packs/day: 1.50    Years: 58.00    Pack years: 87.00    Types: Cigarettes  . Smokeless tobacco: Never Used  Substance Use Topics  . Alcohol use: No    Alcohol/week: 0.0 standard drinks  . Drug use: No     Allergies   Tetracyclines & related and Shellfish-derived products   Review of Systems Review  of Systems  Constitutional: Negative for activity change, appetite change, chills and fever.  HENT: Negative for facial swelling, sore throat and trouble swallowing.   Respiratory: Negative for chest tightness, shortness of breath and wheezing.   Musculoskeletal: Negative for neck pain and neck stiffness.  Skin: Positive for rash. Negative for wound.  Neurological: Negative for dizziness, weakness, numbness and headaches.     Physical Exam Updated Vital Signs BP 125/78 (BP Location: Left Arm)   Pulse 77   Temp 98.1 F (36.7 C) (Oral)   Ht '5\' 4"'$  (1.626 m)   Wt 84.8 kg   SpO2 96%   BMI 32.10 kg/m   Physical Exam Vitals signs and nursing note reviewed.  Constitutional:      General: She is not in acute distress.    Appearance: Normal appearance. She is well-developed.  HENT:     Head: Normocephalic and atraumatic.     Mouth/Throat:     Mouth: Mucous membranes are moist.     Pharynx: Oropharynx is clear. Uvula midline. No posterior oropharyngeal erythema or uvula swelling.     Comments: No oral lesions.  Uvula is midline and non-edematous Neck:     Musculoskeletal: Normal range of motion and neck supple.  Cardiovascular:     Rate and Rhythm: Normal rate and regular rhythm.     Heart sounds: No murmur.  Pulmonary:     Effort: Pulmonary effort is normal. No respiratory distress.     Breath sounds: Normal breath sounds. No wheezing.  Musculoskeletal: Normal range of motion.        General: No tenderness.  Lymphadenopathy:     Cervical: No cervical adenopathy.  Skin:    General: Skin is warm.     Findings: Rash present. No erythema.     Comments: Erythematous , maculopapular rash to the trunk, bilateral forearms and anterior thighs.  Scattered areas of  excoriation.  No vesicles, pustules or edema.  Hands, face and feet are spared  Neurological:     General: No focal deficit present.     Mental Status: She is alert and oriented to person, place, and time.     Sensory: No  sensory deficit.     Motor: No weakness or abnormal muscle tone.      ED Treatments / Results  Labs (all labs ordered are listed, but only abnormal results are displayed) Labs Reviewed - No data to display  EKG None  Radiology No results found.  Procedures Procedures (including critical care time)  Medications Ordered in ED Medications  dexamethasone (DECADRON) injection 8 mg (8 mg Intramuscular Given 07/18/18 1152)  diphenhydrAMINE (BENADRYL) capsule 25 mg (25 mg Oral Given 07/18/18 1151)     Initial Impression / Assessment and Plan / ED Course  I have reviewed the triage vital signs and the nursing notes.  Pertinent labs & imaging results that were available during my care of the patient were reviewed by me and considered in my medical decision making (see chart for details).        Pt is well appearing.  Vitals reviewed.  No concerning sx's for contagious or anaphylactic rxn. Airway is patent.   Appears c/w a contact dermatitis.  Will treat with steroid and she will continue benadryl as needed.  Appears appropriate for d/c home, return precautions discussed.    Final Clinical Impressions(s) / ED Diagnoses   Final diagnoses:  Dermatitis    ED Discharge Orders         Ordered    predniSONE (DELTASONE) 20 MG tablet  Daily     07/18/18 1159           Kem Parkinson, PA-C 07/18/18 1221    Davonna Belling, MD 07/19/18 1513

## 2018-07-18 NOTE — ED Triage Notes (Signed)
Pt with a rash (new) for 2 weeks that burns and itches.  Last dose of benadryl yesterday.  Pt denies any new medications, new foods, new soaps/lotions/detergent

## 2018-08-06 ENCOUNTER — Encounter (HOSPITAL_COMMUNITY): Payer: Self-pay | Admitting: Emergency Medicine

## 2018-08-06 ENCOUNTER — Other Ambulatory Visit: Payer: Self-pay

## 2018-08-06 ENCOUNTER — Emergency Department (HOSPITAL_COMMUNITY)
Admission: EM | Admit: 2018-08-06 | Discharge: 2018-08-06 | Disposition: A | Discharge status: Home / Self Care | Payer: Medicare Other | Attending: Emergency Medicine | Admitting: Emergency Medicine

## 2018-08-06 DIAGNOSIS — F17210 Nicotine dependence, cigarettes, uncomplicated: Secondary | ICD-10-CM | POA: Insufficient documentation

## 2018-08-06 DIAGNOSIS — Z96651 Presence of right artificial knee joint: Secondary | ICD-10-CM | POA: Diagnosis not present

## 2018-08-06 DIAGNOSIS — I10 Essential (primary) hypertension: Secondary | ICD-10-CM | POA: Insufficient documentation

## 2018-08-06 DIAGNOSIS — R21 Rash and other nonspecific skin eruption: Secondary | ICD-10-CM | POA: Insufficient documentation

## 2018-08-06 DIAGNOSIS — Z7982 Long term (current) use of aspirin: Secondary | ICD-10-CM | POA: Insufficient documentation

## 2018-08-06 DIAGNOSIS — Z79899 Other long term (current) drug therapy: Secondary | ICD-10-CM | POA: Insufficient documentation

## 2018-08-06 DIAGNOSIS — E039 Hypothyroidism, unspecified: Secondary | ICD-10-CM | POA: Diagnosis not present

## 2018-08-06 DIAGNOSIS — Z8673 Personal history of transient ischemic attack (TIA), and cerebral infarction without residual deficits: Secondary | ICD-10-CM | POA: Insufficient documentation

## 2018-08-06 DIAGNOSIS — J449 Chronic obstructive pulmonary disease, unspecified: Secondary | ICD-10-CM | POA: Diagnosis not present

## 2018-08-06 LAB — CBC WITH DIFFERENTIAL/PLATELET
Abs Immature Granulocytes: 0.02 10*3/uL (ref 0.00–0.07)
Basophils Absolute: 0.1 10*3/uL (ref 0.0–0.1)
Basophils Relative: 1 %
Eosinophils Absolute: 0.6 10*3/uL — ABNORMAL HIGH (ref 0.0–0.5)
Eosinophils Relative: 8 %
HCT: 28.9 % — ABNORMAL LOW (ref 36.0–46.0)
Hemoglobin: 8.2 g/dL — ABNORMAL LOW (ref 12.0–15.0)
Immature Granulocytes: 0 %
Lymphocytes Relative: 14 %
Lymphs Abs: 1.1 10*3/uL (ref 0.7–4.0)
MCH: 22.3 pg — ABNORMAL LOW (ref 26.0–34.0)
MCHC: 28.4 g/dL — ABNORMAL LOW (ref 30.0–36.0)
MCV: 78.5 fL — ABNORMAL LOW (ref 80.0–100.0)
Monocytes Absolute: 0.7 10*3/uL (ref 0.1–1.0)
Monocytes Relative: 8 %
Neutro Abs: 5.7 10*3/uL (ref 1.7–7.7)
Neutrophils Relative %: 69 %
Platelets: 225 10*3/uL (ref 150–400)
RBC: 3.68 MIL/uL — ABNORMAL LOW (ref 3.87–5.11)
RDW: 17.8 % — ABNORMAL HIGH (ref 11.5–15.5)
WBC: 8.2 10*3/uL (ref 4.0–10.5)
nRBC: 0.4 % — ABNORMAL HIGH (ref 0.0–0.2)

## 2018-08-06 LAB — COMPREHENSIVE METABOLIC PANEL
ALT: 33 U/L (ref 0–44)
AST: 26 U/L (ref 15–41)
Albumin: 3.3 g/dL — ABNORMAL LOW (ref 3.5–5.0)
Alkaline Phosphatase: 84 U/L (ref 38–126)
Anion gap: 12 (ref 5–15)
BUN: 26 mg/dL — ABNORMAL HIGH (ref 8–23)
CO2: 27 mmol/L (ref 22–32)
Calcium: 9.6 mg/dL (ref 8.9–10.3)
Chloride: 101 mmol/L (ref 98–111)
Creatinine, Ser: 1.09 mg/dL — ABNORMAL HIGH (ref 0.44–1.00)
GFR calc Af Amer: 58 mL/min — ABNORMAL LOW (ref >60)
GFR calc non Af Amer: 50 mL/min — ABNORMAL LOW (ref >60)
Glucose, Bld: 103 mg/dL — ABNORMAL HIGH (ref 70–99)
Potassium: 3.6 mmol/L (ref 3.5–5.1)
Sodium: 140 mmol/L (ref 135–145)
Total Bilirubin: 0.2 mg/dL — ABNORMAL LOW (ref 0.3–1.2)
Total Protein: 6.6 g/dL (ref 6.5–8.1)

## 2018-08-06 MED ORDER — PREDNISONE 10 MG PO TABS: ORAL_TABLET | ORAL | 0 refills | Status: DC

## 2018-08-06 MED ORDER — HYDROXYZINE HCL 25 MG PO TABS: 25.0000 mg | ORAL_TABLET | ORAL | 0 refills | Status: DC | PRN

## 2018-08-06 MED ORDER — HYDROXYZINE HCL 25 MG PO TABS
25.0000 mg | ORAL_TABLET | Freq: Once | ORAL | Status: AC
  Administered 2018-08-06: 13:00:00 25 mg via ORAL
  Filled 2018-08-06: qty 1

## 2018-08-06 MED ORDER — PERMETHRIN 5 % EX CREA: 1.0000 "application " | TOPICAL_CREAM | Freq: Once | CUTANEOUS | 0 refills | Status: AC

## 2018-08-06 MED ORDER — PERMETHRIN 1 % EX LOTN: 1.0000 "application " | TOPICAL_LOTION | Freq: Once | CUTANEOUS | 0 refills | Status: AC

## 2018-08-06 NOTE — ED Triage Notes (Signed)
Patient reports rash with itching and burning, seen here for same 2 weeks ago. Patient has been seen here twice and at the ED in [place]. Patient states she is having a lot of pain that feels like burning.

## 2018-08-06 NOTE — Discharge Instructions (Addendum)
See the Dermatologist as scheduled

## 2018-08-06 NOTE — ED Provider Notes (Signed)
[facility] EMERGENCY DEPARTMENT Provider Note   CSN: [id number] Arrival date & time: 08/06/18  1126    History   Chief Complaint Chief Complaint  Patient presents with  . Rash    HPI Connie Ponce is a 73 y.o. female.     The history is provided by the patient. No language interpreter was used.  Rash  Location:  Full body Quality: itchiness   Severity:  Severe Onset quality:  Gradual Duration:  5 weeks Timing:  Constant Progression:  Worsening Chronicity:  New Context: not animal contact, not chemical exposure, not insect bite/sting, not medications, not new detergent/soap and not sick contacts   Relieved by:  Antihistamines and topical steroids Worsened by:  Nothing Ineffective treatments:  Antihistamines Associated symptoms: no abdominal pain and not vomiting   Pt is scheduled to see Dermatology in July. Pt reports she had to come in   Past Medical History:  Diagnosis Date  . Anxiety   . Carpal tunnel syndrome   . COPD (chronic obstructive pulmonary disease) ([place])   . Gastric ulcer   . GERD (gastroesophageal reflux disease)   . Hyperglycemia   . Hyperlipidemia   . Hypertension   . Hypothyroidism   . Osteoarthritis   . Peptic ulcer disease   . Pneumonia 2010  . Renal insufficiency   . 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 Stroke ([place]) 1990s   mild  . TIA (transient ischemic attack)     Patient Active Problem List   Diagnosis Date Noted  . Syncope and collapse 03/28/2018  . COPD (chronic obstructive pulmonary disease) ([place]) 03/28/2018  . Tobacco abuse 03/28/2018  . Fall at home 03/28/2018  . CKD (chronic kidney disease) stage 3, GFR 30-59 ml/min (HCC) 03/28/2018  . Anemia in chronic kidney disease (CKD) 03/28/2018  . Hyperlipidemia 03/28/2018  . Acute metabolic encephalopathy 85/88/5027  . Gait instability 10/03/2017  . Dysarthria 10/03/2017  . Essential hypertension 10/03/2017  . Slurred speech   . Abdominal mass, RUQ (right upper quadrant)  03/13/2017  . Abdominal pain 03/13/2017  . RUQ pain 03/13/2017  . Dysphagia   . TIA (transient ischemic attack) 01/03/2016  . Absolute anemia 07/03/2015  . Proctalgia   . Rectal pain 01/04/2015  . Hemorrhoids 01/04/2015  . Diarrhea 01/04/2015  . Gastric ulceration   . Gastric ulcer 11/29/2014  . Toxic metabolic encephalopathy 74/02/8785  . UTI (urinary tract infection) 09/19/2014  . ARF (acute renal failure) ([place])   . LUQ pain 07/28/2014  . Nausea with vomiting 07/28/2014  . Bilateral lower extremity edema 07/28/2014  . Thrombocytopenia ([place]) 04/12/2014  . RLQ abdominal pain 01/22/2012  . Constipation 01/22/2012  . GERD (gastroesophageal reflux disease) 01/22/2012    Past Surgical History:  Procedure Laterality Date  . ABDOMINAL HYSTERECTOMY    . APPENDECTOMY    . BACK SURGERY    . BONE MARROW ASPIRATION Left 04/22/14  . BONE MARROW BIOPSY Left 04/22/14  . CHOLECYSTECTOMY    . COLONOSCOPY     ?2005, [place]  . COLONOSCOPY N/A 04/07/2015   VEH:MCNOBS  . COLONOSCOPY WITH ESOPHAGOGASTRODUODENOSCOPY (EGD)  02/11/2012   RMR: Ulcerative/erosive reflux esophagitis, benign appearing gastric ulcer with unremarkable biopsy, no H pylori. Colonoscopy was unremarkable. Next colonoscopy in 2023  . CYSTECTOMY     cyst removed from rectal area.   . ESOPHAGOGASTRODUODENOSCOPY  01/05/2003   RMR: Normal esophagus, small hiatal hernia/ A couple of tiny antral erosions, otherwise normal stomach normal D1 and D2.   Marland Kitchen  ESOPHAGOGASTRODUODENOSCOPY N/A 08/11/2014   WGN:FAOZHYQ ulcer/HH  . ESOPHAGOGASTRODUODENOSCOPY N/A 12/01/2014   MVH:[id number] improved gastric ulcerations/p bx  . ESOPHAGOGASTRODUODENOSCOPY N/A 01/31/2016   Procedure: ESOPHAGOGASTRODUODENOSCOPY (EGD);  Surgeon: Daneil Dolin, MD;  Location[facility]O [facility];  Service: Endoscopy;  Laterality: N/A;  745  . HEMORRHOID SURGERY    . JOINT REPLACEMENT Right    knee  . MALONEY DILATION N/A 01/31/2016   Procedure: Venia Minks  DILATION;  Surgeon: Daneil Dolin, MD;  Location: [facility];  Service: Endoscopy;  Laterality: N/A;     OB History    Gravida  4   Para      Term      Preterm      AB      Living  3     SAB      TAB      Ectopic      Multiple      Live Births               Home Medications    Prior to Admission medications   Medication Sig Start Date End Date Taking? Authorizing Provider  albuterol (PROVENTIL HFA;VENTOLIN HFA) 108 (90 Base) MCG/ACT inhaler Inhale 2 puffs into the lungs every 4 (four) hours as needed for wheezing or shortness of breath. 08/10/17   Noemi Chapel, MD  allopurinol (ZYLOPRIM) 100 MG tablet Take 100 mg by mouth as needed.  01/20/18   [provider]  amoxicillin-clavulanate (AUGMENTIN) 875-125 MG tablet Take 1 tablet by mouth 2 (two) times daily. One po bid x 7 days 07/05/18   Daleen Bo, MD  aspirin EC 81 MG EC tablet Take 2 tablets (162 mg total) by mouth daily. 01/05/16   Thurnell Lose, MD  budesonide-formoterol (SYMBICORT) 160-4.5 MCG/ACT inhaler Inhale 2 puffs into the lungs 2 (two) times daily as needed (shortness of breath).     [provider]  carvedilol (COREG) 12.5 MG tablet 12.5 mg 2 (two) times daily with a meal.  07/08/17   [provider]  Cholecalciferol (VITAMIN D-3) 5000 UNITS TABS Take 1 tablet by mouth daily.    [provider]  DEXILANT 60 MG capsule TAKE (1) CAPSULE BY MOUTH ONCE DAILY. 07/09/18   Carlis Stable, NP  diltiazem (TIAZAC) 360 MG 24 hr capsule Take 360 mg by mouth daily.  01/29/18   [provider]  ELIQUIS 5 MG TABS tablet Take 5 mg by mouth 2 (two) times daily.  01/29/18   [provider]  furosemide (LASIX) 20 MG tablet Take 1 tablet (20 mg total) by mouth every other day. 04/03/18   Johnson, Clanford L, MD  gabapentin (NEURONTIN) 300 MG capsule 300 mg 3 (three) times daily.  07/08/17   [provider]  hydrOXYzine (ATARAX/VISTARIL) 25 MG tablet Take 25  mg by mouth every 4 (four) hours as needed for itching.  08/13/17   [provider]  levothyroxine (SYNTHROID, LEVOTHROID) 150 MCG tablet Take 1 tablet (150 mcg total) by mouth daily before breakfast for 30 days. 03/31/18 04/30/18  Johnson, Clanford L, MD  MOVANTIK 12.5 MG TABS tablet TAKE 1 TABLET BY MOUTH ONCE DAILY. 05/08/18   Mahala Menghini, PA-C  oxybutynin (DITROPAN) 5 MG tablet TAKE (1) TABLET BY MOUTH THREE TIMES DAILY 05/05/18   Florian Buff, MD  oxyCODONE-acetaminophen (PERCOCET/ROXICET) 5-325 MG tablet Take 1 tablet by mouth every 6 (six) hours as needed for severe pain. 03/31/18   Murlean Iba, MD  potassium  chloride SA (K-DUR,KLOR-CON) 20 MEQ tablet Take 1 tablet (20 mEq total) by mouth every other day. 04/03/18   Johnson, Clanford L, MD  pravastatin (PRAVACHOL) 40 MG tablet Take 40 mg by mouth daily.    [provider]  predniSONE (DELTASONE) 20 MG tablet Take 2 tablets (40 mg total) by mouth daily. 07/18/18   Triplett, Tammy, PA-C  ranitidine (ZANTAC) 150 MG tablet Take 150 mg by mouth at bedtime.  12/08/14   [provider]  sertraline (ZOLOFT) 50 MG tablet  07/08/17   [provider]  SPIRIVA HANDIHALER 18 MCG inhalation capsule Place 18 mcg into inhaler and inhale as needed.  08/19/17   [provider]  traZODone (DESYREL) 150 MG tablet  10/01/17   [provider]    Family History Family History  Problem Relation Age of Onset  . Other Mother        died age 80, natural causes  . Stroke Mother   . Diabetes Daughter   . Healthy Son   . Healthy Daughter   . Healthy Daughter   . Colon cancer Neg Hx   . Liver disease Neg Hx     Social History Social History   Tobacco Use  . Smoking status: Current Every Day Smoker    Packs/day: 1.50    Years: 58.00    Pack years: 87.00    Types: Cigarettes  . Smokeless tobacco: Never Used  Substance Use Topics  . Alcohol use: No    Alcohol/week: 0.0 standard drinks  . Drug use:  No     Allergies   Tetracyclines & related and Shellfish-derived products   Review of Systems Review of Systems  Gastrointestinal: Negative for abdominal pain and vomiting.  Skin: Positive for rash.  All other systems reviewed and are negative.    Physical Exam Updated Vital Signs BP 123/67 (BP Location: Right Arm)   Pulse 88   Temp 98.5 F (36.9 C) (Oral)   Resp 18   Ht 5' 4"  (1.626 m)   Wt 84.8 kg   SpO2 99%   BMI 32.10 kg/m   Physical Exam Vitals signs and nursing note reviewed.  Constitutional:      Appearance: She is well-developed.  HENT:     Head: Normocephalic.     Nose: Nose normal.     Mouth/Throat:     Mouth: Mucous membranes are moist.  Eyes:     Pupils: Pupils are equal, round, and reactive to light.  Neck:     Musculoskeletal: Normal range of motion.  Cardiovascular:     Rate and Rhythm: Normal rate.  Pulmonary:     Effort: Pulmonary effort is normal.  Abdominal:     General: Abdomen is flat. There is no distension.  Musculoskeletal: Normal range of motion.  Skin:    General: Skin is warm.  Neurological:     Mental Status: She is alert and oriented to person, place, and time.  Psychiatric:        Mood and Affect: Mood normal.      ED Treatments / Results  Labs (all labs ordered are listed, but only abnormal results are displayed) Labs Reviewed  CBC WITH DIFFERENTIAL/PLATELET - Abnormal; Notable for the following components:      Result Value   RBC 3.68 (*)    Hemoglobin 8.2 (*)    HCT 28.9 (*)    MCV 78.5 (*)    MCH 22.3 (*)    MCHC 28.4 (*)  RDW 17.8 (*)    nRBC 0.4 (*)    Eosinophils Absolute 0.6 (*)    All other components within normal limits  COMPREHENSIVE METABOLIC PANEL - Abnormal; Notable for the following components:   Glucose, Bld 103 (*)    BUN 26 (*)    Creatinine, Ser 1.09 (*)    Albumin 3.3 (*)    Total Bilirubin 0.2 (*)    GFR calc non Af Amer 50 (*)    GFR calc Af Amer 58 (*)    All other components  within normal limits    EKG None  Radiology No results found.  Procedures Procedures (including critical care time)  Medications Ordered in ED Medications  hydrOXYzine (ATARAX/VISTARIL) tablet 25 mg (25 mg Oral Given 08/06/18 1252)     Initial Impression / Assessment and Plan / ED Course  I have reviewed the triage vital signs and the nursing notes.  Pertinent labs & imaging results that were available during my care of the patient were reviewed by me and considered in my medical decision making (see chart for details).        MDM  I will try pt on prednisone and elemite.  Areas look like bites,  Pt has not seen any bedbugs.   Final Clinical Impressions(s) / ED Diagnoses   Final diagnoses:  Rash    ED Discharge Orders         Ordered    permethrin (ELIMITE) 1 % lotion   Once     08/06/18 1330    predniSONE (DELTASONE) 10 MG tablet     08/06/18 1330    hydrOXYzine (ATARAX/VISTARIL) 25 MG tablet  Every 4 hours PRN     08/06/18 1330        An After Visit Summary was printed and given to the patient.    Fransico Meadow, [place] 08/06/18 1332    Margette Fast, MD 08/07/18 [phone]

## 2018-08-16 ENCOUNTER — Inpatient Hospital Stay (HOSPITAL_COMMUNITY)
Admission: EM | Admit: 2018-08-16 | Discharge: 2018-08-19 | DRG: 683 | Disposition: A | Discharge status: Home Health | Payer: Medicare Other | Attending: Internal Medicine | Admitting: Internal Medicine

## 2018-08-16 ENCOUNTER — Other Ambulatory Visit: Payer: Self-pay

## 2018-08-16 ENCOUNTER — Emergency Department (HOSPITAL_COMMUNITY): Payer: Medicare Other

## 2018-08-16 ENCOUNTER — Encounter (HOSPITAL_COMMUNITY): Payer: Self-pay | Admitting: Emergency Medicine

## 2018-08-16 DIAGNOSIS — R059 Cough, unspecified: Secondary | ICD-10-CM

## 2018-08-16 DIAGNOSIS — Z720 Tobacco use: Secondary | ICD-10-CM | POA: Diagnosis not present

## 2018-08-16 DIAGNOSIS — E039 Hypothyroidism, unspecified: Secondary | ICD-10-CM | POA: Diagnosis present

## 2018-08-16 DIAGNOSIS — Z20828 Contact with and (suspected) exposure to other viral communicable diseases: Secondary | ICD-10-CM | POA: Diagnosis present

## 2018-08-16 DIAGNOSIS — T447X5A Adverse effect of beta-adrenoreceptor antagonists, initial encounter: Secondary | ICD-10-CM | POA: Diagnosis present

## 2018-08-16 DIAGNOSIS — J069 Acute upper respiratory infection, unspecified: Secondary | ICD-10-CM | POA: Diagnosis present

## 2018-08-16 DIAGNOSIS — E785 Hyperlipidemia, unspecified: Secondary | ICD-10-CM | POA: Diagnosis present

## 2018-08-16 DIAGNOSIS — Z86718 Personal history of other venous thrombosis and embolism: Secondary | ICD-10-CM | POA: Diagnosis not present

## 2018-08-16 DIAGNOSIS — F17210 Nicotine dependence, cigarettes, uncomplicated: Secondary | ICD-10-CM | POA: Diagnosis present

## 2018-08-16 DIAGNOSIS — Z7982 Long term (current) use of aspirin: Secondary | ICD-10-CM | POA: Diagnosis not present

## 2018-08-16 DIAGNOSIS — D509 Iron deficiency anemia, unspecified: Secondary | ICD-10-CM | POA: Diagnosis present

## 2018-08-16 DIAGNOSIS — D649 Anemia, unspecified: Secondary | ICD-10-CM

## 2018-08-16 DIAGNOSIS — Y92009 Unspecified place in unspecified non-institutional (private) residence as the place of occurrence of the external cause: Secondary | ICD-10-CM

## 2018-08-16 DIAGNOSIS — M5136 Other intervertebral disc degeneration, lumbar region: Secondary | ICD-10-CM | POA: Diagnosis present

## 2018-08-16 DIAGNOSIS — N390 Urinary tract infection, site not specified: Secondary | ICD-10-CM | POA: Diagnosis present

## 2018-08-16 DIAGNOSIS — N179 Acute kidney failure, unspecified: Principal | ICD-10-CM | POA: Diagnosis present

## 2018-08-16 DIAGNOSIS — B9620 Unspecified Escherichia coli [E. coli] as the cause of diseases classified elsewhere: Secondary | ICD-10-CM | POA: Diagnosis present

## 2018-08-16 DIAGNOSIS — K219 Gastro-esophageal reflux disease without esophagitis: Secondary | ICD-10-CM | POA: Diagnosis present

## 2018-08-16 DIAGNOSIS — R55 Syncope and collapse: Secondary | ICD-10-CM | POA: Diagnosis present

## 2018-08-16 DIAGNOSIS — R001 Bradycardia, unspecified: Secondary | ICD-10-CM | POA: Diagnosis present

## 2018-08-16 DIAGNOSIS — I10 Essential (primary) hypertension: Secondary | ICD-10-CM | POA: Diagnosis present

## 2018-08-16 DIAGNOSIS — M48061 Spinal stenosis, lumbar region without neurogenic claudication: Secondary | ICD-10-CM | POA: Diagnosis present

## 2018-08-16 DIAGNOSIS — Z79890 Hormone replacement therapy (postmenopausal): Secondary | ICD-10-CM | POA: Diagnosis not present

## 2018-08-16 DIAGNOSIS — I952 Hypotension due to drugs: Secondary | ICD-10-CM | POA: Diagnosis present

## 2018-08-16 DIAGNOSIS — T461X5A Adverse effect of calcium-channel blockers, initial encounter: Secondary | ICD-10-CM | POA: Diagnosis present

## 2018-08-16 DIAGNOSIS — Z7901 Long term (current) use of anticoagulants: Secondary | ICD-10-CM | POA: Diagnosis not present

## 2018-08-16 DIAGNOSIS — E860 Dehydration: Secondary | ICD-10-CM | POA: Diagnosis present

## 2018-08-16 DIAGNOSIS — R05 Cough: Secondary | ICD-10-CM

## 2018-08-16 DIAGNOSIS — Z8673 Personal history of transient ischemic attack (TIA), and cerebral infarction without residual deficits: Secondary | ICD-10-CM | POA: Diagnosis not present

## 2018-08-16 DIAGNOSIS — T501X5A Adverse effect of loop [high-ceiling] diuretics, initial encounter: Secondary | ICD-10-CM | POA: Diagnosis present

## 2018-08-16 DIAGNOSIS — Z823 Family history of stroke: Secondary | ICD-10-CM

## 2018-08-16 DIAGNOSIS — Z833 Family history of diabetes mellitus: Secondary | ICD-10-CM

## 2018-08-16 DIAGNOSIS — J449 Chronic obstructive pulmonary disease, unspecified: Secondary | ICD-10-CM | POA: Diagnosis present

## 2018-08-16 HISTORY — DX: Atherosclerotic heart disease of native coronary artery without angina pectoris: I25.10

## 2018-08-16 LAB — COMPREHENSIVE METABOLIC PANEL
ALT: 19 U/L (ref 0–44)
AST: 19 U/L (ref 15–41)
Albumin: 3.3 g/dL — ABNORMAL LOW (ref 3.5–5.0)
Alkaline Phosphatase: 67 U/L (ref 38–126)
Anion gap: 13 (ref 5–15)
BUN: 39 mg/dL — ABNORMAL HIGH (ref 8–23)
CO2: 24 mmol/L (ref 22–32)
Calcium: 8.8 mg/dL — ABNORMAL LOW (ref 8.9–10.3)
Chloride: 95 mmol/L — ABNORMAL LOW (ref 98–111)
Creatinine, Ser: 2.32 mg/dL — ABNORMAL HIGH (ref 0.44–1.00)
GFR calc Af Amer: 23 mL/min — ABNORMAL LOW (ref >60)
GFR calc non Af Amer: 20 mL/min — ABNORMAL LOW (ref >60)
Glucose, Bld: 109 mg/dL — ABNORMAL HIGH (ref 70–99)
Potassium: 3.1 mmol/L — ABNORMAL LOW (ref 3.5–5.1)
Sodium: 132 mmol/L — ABNORMAL LOW (ref 135–145)
Total Bilirubin: 0.2 mg/dL — ABNORMAL LOW (ref 0.3–1.2)
Total Protein: 6.3 g/dL — ABNORMAL LOW (ref 6.5–8.1)

## 2018-08-16 LAB — RETICULOCYTES
Immature Retic Fract: 43.8 % — ABNORMAL HIGH (ref 2.3–15.9)
RBC.: 3.64 MIL/uL — ABNORMAL LOW (ref 3.87–5.11)
Retic Count, Absolute: 72.1 10*3/uL (ref 19.0–186.0)
Retic Ct Pct: 2 % (ref 0.4–3.1)

## 2018-08-16 LAB — VITAMIN B12: Vitamin B-12: 353 pg/mL (ref 180–914)

## 2018-08-16 LAB — CBC WITH DIFFERENTIAL/PLATELET
Abs Immature Granulocytes: 0.25 10*3/uL — ABNORMAL HIGH (ref 0.00–0.07)
Basophils Absolute: 0.1 10*3/uL (ref 0.0–0.1)
Basophils Relative: 1 %
Eosinophils Absolute: 0.7 10*3/uL — ABNORMAL HIGH (ref 0.0–0.5)
Eosinophils Relative: 7 %
HCT: 27.4 % — ABNORMAL LOW (ref 36.0–46.0)
Hemoglobin: 8 g/dL — ABNORMAL LOW (ref 12.0–15.0)
Immature Granulocytes: 3 %
Lymphocytes Relative: 24 %
Lymphs Abs: 2.4 10*3/uL (ref 0.7–4.0)
MCH: 21.7 pg — ABNORMAL LOW (ref 26.0–34.0)
MCHC: 29.2 g/dL — ABNORMAL LOW (ref 30.0–36.0)
MCV: 74.3 fL — ABNORMAL LOW (ref 80.0–100.0)
Monocytes Absolute: 1.1 10*3/uL — ABNORMAL HIGH (ref 0.1–1.0)
Monocytes Relative: 12 %
Neutro Abs: 5.4 10*3/uL (ref 1.7–7.7)
Neutrophils Relative %: 53 %
Platelets: 320 10*3/uL (ref 150–400)
RBC: 3.69 MIL/uL — ABNORMAL LOW (ref 3.87–5.11)
RDW: 18.6 % — ABNORMAL HIGH (ref 11.5–15.5)
WBC: 9.9 10*3/uL (ref 4.0–10.5)
nRBC: 0.9 % — ABNORMAL HIGH (ref 0.0–0.2)

## 2018-08-16 LAB — IRON AND TIBC
Iron: 10 ug/dL — ABNORMAL LOW (ref 28–170)
Saturation Ratios: 2 % — ABNORMAL LOW (ref 10.4–31.8)
TIBC: 405 ug/dL (ref 250–450)
UIBC: 395 ug/dL

## 2018-08-16 LAB — URINALYSIS, ROUTINE W REFLEX MICROSCOPIC
Bilirubin Urine: NEGATIVE
Glucose, UA: NEGATIVE mg/dL
Hgb urine dipstick: NEGATIVE
Ketones, ur: NEGATIVE mg/dL
Nitrite: NEGATIVE
Protein, ur: NEGATIVE mg/dL
Specific Gravity, Urine: 1.025 (ref 1.005–1.030)
WBC, UA: >50 WBC/hpf — ABNORMAL HIGH (ref 0–5)
pH: 5 (ref 5.0–8.0)

## 2018-08-16 LAB — CBC
HCT: 27.4 % — ABNORMAL LOW (ref 36.0–46.0)
Hemoglobin: 7.9 g/dL — ABNORMAL LOW (ref 12.0–15.0)
MCH: 21.4 pg — ABNORMAL LOW (ref 26.0–34.0)
MCHC: 28.8 g/dL — ABNORMAL LOW (ref 30.0–36.0)
MCV: 74.3 fL — ABNORMAL LOW (ref 80.0–100.0)
Platelets: 309 10*3/uL (ref 150–400)
RBC: 3.69 MIL/uL — ABNORMAL LOW (ref 3.87–5.11)
RDW: 18.4 % — ABNORMAL HIGH (ref 11.5–15.5)
WBC: 9.4 10*3/uL (ref 4.0–10.5)
nRBC: 0.5 % — ABNORMAL HIGH (ref 0.0–0.2)

## 2018-08-16 LAB — BASIC METABOLIC PANEL
Anion gap: 18 — ABNORMAL HIGH (ref 5–15)
BUN: 45 mg/dL — ABNORMAL HIGH (ref 8–23)
CO2: 22 mmol/L (ref 22–32)
Calcium: 9.4 mg/dL (ref 8.9–10.3)
Chloride: 93 mmol/L — ABNORMAL LOW (ref 98–111)
Creatinine, Ser: 2.92 mg/dL — ABNORMAL HIGH (ref 0.44–1.00)
GFR calc Af Amer: 17 mL/min — ABNORMAL LOW (ref >60)
GFR calc non Af Amer: 15 mL/min — ABNORMAL LOW (ref >60)
Glucose, Bld: 129 mg/dL — ABNORMAL HIGH (ref 70–99)
Potassium: 3.8 mmol/L (ref 3.5–5.1)
Sodium: 133 mmol/L — ABNORMAL LOW (ref 135–145)

## 2018-08-16 LAB — FOLATE: Folate: 16.1 ng/mL (ref >5.9)

## 2018-08-16 LAB — SARS CORONAVIRUS 2 BY RT PCR (HOSPITAL ORDER, PERFORMED IN ~~LOC~~ HOSPITAL LAB): SARS Coronavirus 2: NEGATIVE

## 2018-08-16 LAB — TROPONIN I: Troponin I: <0.03 ng/mL (ref <0.03)

## 2018-08-16 LAB — MAGNESIUM: Magnesium: 1.9 mg/dL (ref 1.7–2.4)

## 2018-08-16 LAB — FERRITIN: Ferritin: 17 ng/mL (ref 11–307)

## 2018-08-16 MED ORDER — SERTRALINE HCL 50 MG PO TABS
50.0000 mg | ORAL_TABLET | Freq: Every day | ORAL | Status: DC
  Administered 2018-08-16 – 2018-08-19 (×4): 50 mg via ORAL
  Filled 2018-08-16 (×4): qty 1

## 2018-08-16 MED ORDER — OXYBUTYNIN CHLORIDE 5 MG PO TABS
5.0000 mg | ORAL_TABLET | Freq: Three times a day (TID) | ORAL | Status: DC
  Administered 2018-08-16 – 2018-08-19 (×10): 5 mg via ORAL
  Filled 2018-08-16 (×10): qty 1

## 2018-08-16 MED ORDER — PRAVASTATIN SODIUM 40 MG PO TABS
40.0000 mg | ORAL_TABLET | Freq: Every day | ORAL | Status: DC
  Administered 2018-08-16 – 2018-08-19 (×4): 40 mg via ORAL
  Filled 2018-08-16 (×4): qty 1

## 2018-08-16 MED ORDER — GABAPENTIN 300 MG PO CAPS
300.0000 mg | ORAL_CAPSULE | Freq: Three times a day (TID) | ORAL | Status: DC
  Administered 2018-08-16: 300 mg via ORAL
  Filled 2018-08-16: qty 1

## 2018-08-16 MED ORDER — SODIUM CHLORIDE 0.9 % IV SOLN
1.0000 g | Freq: Once | INTRAVENOUS | Status: AC
  Administered 2018-08-16: 04:00:00 1 g via INTRAVENOUS
  Filled 2018-08-16: qty 10

## 2018-08-16 MED ORDER — FERROUS SULFATE 325 (65 FE) MG PO TABS
325.0000 mg | ORAL_TABLET | Freq: Every day | ORAL | Status: DC
  Administered 2018-08-17 – 2018-08-19 (×3): 325 mg via ORAL
  Filled 2018-08-16 (×4): qty 1

## 2018-08-16 MED ORDER — ACETAMINOPHEN 650 MG RE SUPP: 650.0000 mg | Freq: Four times a day (QID) | RECTAL | Status: DC | PRN

## 2018-08-16 MED ORDER — TRAZODONE HCL 50 MG PO TABS
150.0000 mg | ORAL_TABLET | Freq: Every evening | ORAL | Status: DC | PRN
  Administered 2018-08-16 – 2018-08-17 (×2): 150 mg via ORAL
  Filled 2018-08-16 (×2): qty 3

## 2018-08-16 MED ORDER — POTASSIUM CHLORIDE 10 MEQ/100ML IV SOLN
10.0000 meq | INTRAVENOUS | Status: AC
  Administered 2018-08-16 (×4): 10 meq via INTRAVENOUS
  Filled 2018-08-16 (×2): qty 100

## 2018-08-16 MED ORDER — ONDANSETRON HCL 4 MG/2ML IJ SOLN: 4.0000 mg | Freq: Four times a day (QID) | INTRAMUSCULAR | Status: DC | PRN

## 2018-08-16 MED ORDER — SODIUM CHLORIDE 0.9 % IV SOLN
INTRAVENOUS | Status: DC
  Administered 2018-08-16 (×2): 75 mL/h via INTRAVENOUS
  Administered 2018-08-17: 23:00:00 via INTRAVENOUS

## 2018-08-16 MED ORDER — FLUTICASONE FUROATE-VILANTEROL 200-25 MCG/INH IN AEPB
1.0000 | INHALATION_SPRAY | Freq: Every day | RESPIRATORY_TRACT | Status: DC
  Administered 2018-08-18 – 2018-08-19 (×2): 1 via RESPIRATORY_TRACT
  Filled 2018-08-16: qty 28

## 2018-08-16 MED ORDER — PANTOPRAZOLE SODIUM 40 MG PO TBEC
40.0000 mg | DELAYED_RELEASE_TABLET | Freq: Every day | ORAL | Status: DC
  Administered 2018-08-16 – 2018-08-19 (×4): 40 mg via ORAL
  Filled 2018-08-16 (×4): qty 1

## 2018-08-16 MED ORDER — APIXABAN 5 MG PO TABS
5.0000 mg | ORAL_TABLET | Freq: Two times a day (BID) | ORAL | Status: DC
  Administered 2018-08-16 – 2018-08-19 (×7): 5 mg via ORAL
  Filled 2018-08-16 (×7): qty 1

## 2018-08-16 MED ORDER — GABAPENTIN 100 MG PO CAPS
100.0000 mg | ORAL_CAPSULE | Freq: Three times a day (TID) | ORAL | Status: DC
  Administered 2018-08-16 (×2): 100 mg via ORAL
  Filled 2018-08-16 (×2): qty 1

## 2018-08-16 MED ORDER — HYDRALAZINE HCL 25 MG PO TABS: 25.0000 mg | ORAL_TABLET | Freq: Four times a day (QID) | ORAL | Status: DC | PRN

## 2018-08-16 MED ORDER — SODIUM CHLORIDE 0.9 % IV SOLN
1.0000 g | INTRAVENOUS | Status: DC
  Administered 2018-08-17 – 2018-08-19 (×3): 1 g via INTRAVENOUS
  Filled 2018-08-16 (×3): qty 10

## 2018-08-16 MED ORDER — ACETAMINOPHEN 325 MG PO TABS
650.0000 mg | ORAL_TABLET | Freq: Four times a day (QID) | ORAL | Status: DC | PRN
  Administered 2018-08-17 – 2018-08-19 (×4): 650 mg via ORAL
  Filled 2018-08-16 (×4): qty 2

## 2018-08-16 MED ORDER — SODIUM CHLORIDE 0.9 % IV BOLUS
1000.0000 mL | Freq: Once | INTRAVENOUS | Status: AC
  Administered 2018-08-16: 1000 mL via INTRAVENOUS

## 2018-08-16 MED ORDER — OXYCODONE HCL 5 MG PO TABS
5.0000 mg | ORAL_TABLET | ORAL | Status: DC | PRN
  Administered 2018-08-16 – 2018-08-19 (×7): 5 mg via ORAL
  Filled 2018-08-16 (×7): qty 1

## 2018-08-16 MED ORDER — LEVOTHYROXINE SODIUM 75 MCG PO TABS
150.0000 ug | ORAL_TABLET | Freq: Every day | ORAL | Status: DC
  Administered 2018-08-16 – 2018-08-19 (×4): 150 ug via ORAL
  Filled 2018-08-16 (×4): qty 2

## 2018-08-16 MED ORDER — ONDANSETRON HCL 4 MG PO TABS: 4.0000 mg | ORAL_TABLET | Freq: Four times a day (QID) | ORAL | Status: DC | PRN

## 2018-08-16 MED ORDER — ACETAMINOPHEN 325 MG PO TABS
650.0000 mg | ORAL_TABLET | Freq: Once | ORAL | Status: AC
  Administered 2018-08-16: 04:00:00 650 mg via ORAL
  Filled 2018-08-16: qty 2

## 2018-08-16 NOTE — Plan of Care (Signed)
  Problem: Clinical Measurements: Goal: Ability to maintain clinical measurements within normal limits will improve Outcome: Progressing   Problem: Nutrition: Goal: Adequate nutrition will be maintained Outcome: Progressing   Problem: Education: Goal: Knowledge of General Education information will improve Description: Including pain rating scale, medication(s)/side effects and non-pharmacologic comfort measures Outcome: Progressing   

## 2018-08-16 NOTE — Progress Notes (Signed)
COVERAGE NOTE   08/16/2018 12:35 PM  Avary Chestine Spore was seen and examined.  The H&P by the admitting provider, orders, imaging was reviewed.  Please see new orders.  Will continue to follow.   Vitals:   08/16/18 0500 08/16/18 0552  BP: (!) 121/49 137/67  Pulse: 63 67  Resp:  18  Temp:  97.7 F (36.5 C)  SpO2: 92% 97%    Results for orders placed or performed during the hospital encounter of 08/16/18  SARS Coronavirus 2 (CEPHEID - Performed in [facility] hospital lab), [facility] Order  Result Value Ref Range   SARS Coronavirus 2 NEGATIVE NEGATIVE  CBC with Differential  Result Value Ref Range   WBC 9.9 4.0 - 10.5 K/uL   RBC 3.69 (L) 3.87 - 5.11 MIL/uL   Hemoglobin 8.0 (L) 12.0 - 15.0 g/dL   HCT 57.0 (L) 17.7 - 93.9 %   MCV 74.3 (L) 80.0 - 100.0 fL   MCH 21.7 (L) 26.0 - 34.0 pg   MCHC 29.2 (L) 30.0 - 36.0 g/dL   RDW 03.0 (H) 09.2 - 33.0 %   Platelets 320 150 - 400 K/uL   nRBC 0.9 (H) 0.0 - 0.2 %   Neutrophils Relative % 53 %   Neutro Abs 5.4 1.7 - 7.7 K/uL   Lymphocytes Relative 24 %   Lymphs Abs 2.4 0.7 - 4.0 K/uL   Monocytes Relative 12 %   Monocytes Absolute 1.1 (H) 0.1 - 1.0 K/uL   Eosinophils Relative 7 %   Eosinophils Absolute 0.7 (H) 0.0 - 0.5 K/uL   Basophils Relative 1 %   Basophils Absolute 0.1 0.0 - 0.1 K/uL   Immature Granulocytes 3 %   Abs Immature Granulocytes 0.25 (H) 0.00 - 0.07 K/uL  Basic metabolic panel  Result Value Ref Range   Sodium 133 (L) 135 - 145 mmol/L   Potassium 3.8 3.5 - 5.1 mmol/L   Chloride 93 (L) 98 - 111 mmol/L   CO2 22 22 - 32 mmol/L   Glucose, Bld 129 (H) 70 - 99 mg/dL   BUN 45 (H) 8 - 23 mg/dL   Creatinine, Ser 0.76 (H) 0.44 - 1.00 mg/dL   Calcium 9.4 8.9 - 22.6 mg/dL   GFR calc non Af Amer 15 (L) >60 mL/min   GFR calc Af Amer 17 (L) >60 mL/min   Anion gap 18 (H) 5 - 15  Troponin I - ONCE - STAT  Result Value Ref Range   Troponin I <0.03 <0.03 ng/mL  Urinalysis, Routine w reflex microscopic  Result Value Ref Range   Color,  Urine YELLOW YELLOW   APPearance HAZY (A) CLEAR   Specific Gravity, Urine 1.025 1.005 - 1.030   pH 5.0 5.0 - 8.0   Glucose, UA NEGATIVE NEGATIVE mg/dL   Hgb urine dipstick NEGATIVE NEGATIVE   Bilirubin Urine NEGATIVE NEGATIVE   Ketones, ur NEGATIVE NEGATIVE mg/dL   Protein, ur NEGATIVE NEGATIVE mg/dL   Nitrite NEGATIVE NEGATIVE   Leukocytes,Ua LARGE (A) NEGATIVE   RBC / HPF 0-5 0 - 5 RBC/hpf   WBC, UA >50 (H) 0 - 5 WBC/hpf   Bacteria, UA MANY (A) NONE SEEN   Squamous Epithelial / LPF 11-20 0 - 5   Hyaline Casts, UA PRESENT   Vitamin B12  Result Value Ref Range   Vitamin B-12 353 180 - 914 pg/mL  Folate  Result Value Ref Range   Folate 16.1 >5.9 ng/mL  Iron and TIBC  Result Value Ref Range  Iron 10 (L) 28 - 170 ug/dL   TIBC 161405 096250 - 045450 ug/dL   Saturation Ratios 2 (L) 10.4 - 31.8 %   UIBC 395 ug/dL  Ferritin  Result Value Ref Range   Ferritin 17 11 - 307 ng/mL  Reticulocytes  Result Value Ref Range   Retic Ct Pct 2.0 0.4 - 3.1 %   RBC. 3.64 (L) 3.87 - 5.11 MIL/uL   Retic Count, Absolute 72.1 19.0 - 186.0 K/uL   Immature Retic Fract 43.8 (H) 2.3 - 15.9 %  CBC  Result Value Ref Range   WBC 9.4 4.0 - 10.5 K/uL   RBC 3.69 (L) 3.87 - 5.11 MIL/uL   Hemoglobin 7.9 (L) 12.0 - 15.0 g/dL   HCT 40.927.4 (L) 81.136.0 - 91.446.0 %   MCV 74.3 (L) 80.0 - 100.0 fL   MCH 21.4 (L) 26.0 - 34.0 pg   MCHC 28.8 (L) 30.0 - 36.0 g/dL   RDW 78.218.4 (H) 95.611.5 - 21.315.5 %   Platelets 309 150 - 400 K/uL   nRBC 0.5 (H) 0.0 - 0.2 %  Comprehensive metabolic panel  Result Value Ref Range   Sodium 132 (L) 135 - 145 mmol/L   Potassium 3.1 (L) 3.5 - 5.1 mmol/L   Chloride 95 (L) 98 - 111 mmol/L   CO2 24 22 - 32 mmol/L   Glucose, Bld 109 (H) 70 - 99 mg/dL   BUN 39 (H) 8 - 23 mg/dL   Creatinine, Ser 0.862.32 (H) 0.44 - 1.00 mg/dL   Calcium 8.8 (L) 8.9 - 10.3 mg/dL   Total Protein 6.3 (L) 6.5 - 8.1 g/dL   Albumin 3.3 (L) 3.5 - 5.0 g/dL   AST 19 15 - 41 U/L   ALT 19 0 - 44 U/L   Alkaline Phosphatase 67 38 - 126 U/L    Total Bilirubin 0.2 (L) 0.3 - 1.2 mg/dL   GFR calc non Af Amer 20 (L) >60 mL/min   GFR calc Af Amer 23 (L) >60 mL/min   Anion gap 13 5 - 15  Magnesium  Result Value Ref Range   Magnesium 1.9 1.7 - 2.4 mg/dL   Maryln Manuel. , MD [facility]   08/16/2018  1:38 AM How to contact the [facility] Attending or Consulting provider 7A - 7P or covering provider during after hours 7P -7A, for this patient?  1. Check the care team in [facility] and look for a) attending/consulting [facility] provider listed and b) the [facility] team listed 2. Log into [email or web address] and use [place]'s universal password to access. If you do not have the password, please contact the hospital operator. 3. Locate the [facility] provider you are looking for under [facility] and page to a number that you can be directly reached. 4. If you still have difficulty reaching the provider, please page the [facility] (Director on Call) for the Hospitalists listed on amion for assistance.

## 2018-08-16 NOTE — ED Triage Notes (Addendum)
Pt states she was getting ready for bed when she fell. Denies LOC. States she hit back R side of head and is c/o L> hip pain. No prior injury noted. Pt states she has been feeling weak and dizzy x 1 month.

## 2018-08-16 NOTE — ED Notes (Signed)
Patient transported to X-ray 

## 2018-08-16 NOTE — ED Notes (Signed)
Pt back from x-ray.

## 2018-08-16 NOTE — ED Notes (Signed)
Pt up with minimal assistance to wheelchair and bathroom

## 2018-08-16 NOTE — H&P (Addendum)
TR[facility] H&P    Patient Demographics:    Connie Ponce, is a 73 y.o. female  MRN: [id number]  DOB - 1946-01-01  Admit Date - 08/16/2018  Referring MD/NP/PA: Dr. Dina Rich  Outpatient Primary MD for the patient is [name], NP  Patient coming from: Home  Chief complaint-fall   HPI:    Connie Ponce  is a 73 y.o. female, with history of hypertension, hyperlipidemia, COPD, DVT on anticoagulation with Eliquis, CVA, ongoing tobacco abuse came to ED after patient fell at home.  Patient says that she got dizzy and fell backwards hitting her head and her left foot.  Patient denies passing out.  As per patient she has been dizzy over past 2 to 3 weeks and felt generally weak.  Denies nausea vomiting or diarrhea.  Denies chest pain or shortness of breath.  Denies fever or chills.  Does complain of cough.  Patient smokes half pack of cigarettes per day.  She describes left hip pain.  In the ED, patient was found to be bradycardic with heart rate 44, EKG showed junctional rhythm. Patient is on both Coreg 12.5 mg p.o. twice daily along with Cardizem 360 mg daily. X-ray of the left hip shows no fracture CT head showed no acute abnormality.  Lab work also revealed abnormal UA, and worsening creatinine 2.92, patient's baseline was 1.09 as of 08/06/2018.  SARS-CoV-2 is negative    Review of systems:    In addition to the HPI above,    All other systems reviewed and are negative.    Past History of the following :    Past Medical History:  Diagnosis Date  . Anxiety   . Carpal tunnel syndrome   . COPD (chronic obstructive pulmonary disease) ([place])   . Gastric ulcer   . GERD (gastroesophageal reflux disease)   . Hyperglycemia   . Hyperlipidemia   . Hypertension   . Hypothyroidism   . Osteoarthritis   . Peptic ulcer disease   . Pneumonia 2010  . Renal insufficiency   . 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  Stroke ([place]) 1990s   mild  . TIA (transient ischemic attack)       Past Surgical History:  Procedure Laterality Date  . ABDOMINAL HYSTERECTOMY    . APPENDECTOMY    . BACK SURGERY    . BONE MARROW ASPIRATION Left 04/22/14  . BONE MARROW BIOPSY Left 04/22/14  . CHOLECYSTECTOMY    . COLONOSCOPY     ?2005, [place]  . COLONOSCOPY N/A 04/07/2015   OEH:OZYYQM  . COLONOSCOPY WITH ESOPHAGOGASTRODUODENOSCOPY (EGD)  02/11/2012   RMR: Ulcerative/erosive reflux esophagitis, benign appearing gastric ulcer with unremarkable biopsy, no H pylori. Colonoscopy was unremarkable. Next colonoscopy in 2023  . CYSTECTOMY     cyst removed from rectal area.   . ESOPHAGOGASTRODUODENOSCOPY  01/05/2003   RMR: Normal esophagus, small hiatal hernia/ A couple of tiny antral erosions, otherwise normal stomach normal D1 and D2.   . ESOPHAGOGASTRODUODENOSCOPY N/A 08/11/2014   GNO:IBBCWUG ulcer/HH  . ESOPHAGOGASTRODUODENOSCOPY N/A 12/01/2014  ZGY:[id number] improved gastric ulcerations/p bx  . ESOPHAGOGASTRODUODENOSCOPY N/A 01/31/2016   Procedure: ESOPHAGOGASTRODUODENOSCOPY (EGD);  Surgeon: Daneil Dolin, MD;  [facility];  Service: Endoscopy;  Laterality: N/A;  745  . HEMORRHOID SURGERY    . JOINT REPLACEMENT Right    knee  . MALONEY DILATION N/A 01/31/2016   Procedure: Venia Minks DILATION;  Surgeon: Daneil Dolin, MD;  Location: [facility];  Service: Endoscopy;  Laterality: N/A;      Social History:      Social History   Tobacco Use  . Smoking status: Current Every Day Smoker    Packs/day: 1.50    Years: 58.00    Pack years: 87.00    Types: Cigarettes  . Smokeless tobacco: Never Used  Substance Use Topics  . Alcohol use: No    Alcohol/week: 0.0 standard drinks       Family History :     Family History  Problem Relation Age of Onset  . Other Mother        died age 27, natural causes  . Stroke Mother   . Diabetes Daughter   . Healthy Son   . Healthy Daughter   .  Healthy Daughter   . Colon cancer Neg Hx   . Liver disease Neg Hx       Home Medications:   Prior to Admission medications   Medication Sig Start Date End Date Taking? Authorizing Provider  albuterol (PROVENTIL HFA;VENTOLIN HFA) 108 (90 Base) MCG/ACT inhaler Inhale 2 puffs into the lungs every 4 (four) hours as needed for wheezing or shortness of breath. 08/10/17  Yes Noemi Chapel, MD  allopurinol (ZYLOPRIM) 100 MG tablet Take 100 mg by mouth as needed.  01/20/18  Yes [provider]  amoxicillin-clavulanate (AUGMENTIN) 875-125 MG tablet Take 1 tablet by mouth 2 (two) times daily. One po bid x 7 days 07/05/18  Yes Daleen Bo, MD  aspirin EC 81 MG EC tablet Take 2 tablets (162 mg total) by mouth daily. 01/05/16  Yes Thurnell Lose, MD  budesonide-formoterol [facility]) 160-4.5 MCG/ACT inhaler Inhale 2 puffs into the lungs 2 (two) times daily as needed (shortness of breath).    Yes [provider]  carvedilol (COREG) 12.5 MG tablet 12.5 mg 2 (two) times daily with a meal.  07/08/17  Yes [provider]  Cholecalciferol (VITAMIN D-3) 5000 UNITS TABS Take 1 tablet by mouth daily.   Yes [provider]  DEXILANT 60 MG capsule TAKE (1) CAPSULE BY MOUTH ONCE DAILY. 07/09/18  Yes Carlis Stable, NP  diltiazem (TIAZAC) 360 MG 24 hr capsule Take 360 mg by mouth daily.  01/29/18  Yes [provider]  ELIQUIS 5 MG TABS tablet Take 5 mg by mouth 2 (two) times daily.  01/29/18  Yes [provider]  furosemide (LASIX) 20 MG tablet Take 1 tablet (20 mg total) by mouth every other day. 04/03/18  Yes Johnson, Clanford L, MD  gabapentin (NEURONTIN) 300 MG capsule 300 mg 3 (three) times daily.  07/08/17  Yes [provider]  hydrOXYzine (ATARAX/VISTARIL) 25 MG tablet Take 1 tablet (25 mg total) by mouth every 4 (four) hours as needed. 08/06/18  Yes Sofia, Leslie K, PA-C  MOVANTIK 12.5 MG TABS tablet TAKE 1 TABLET BY MOUTH ONCE DAILY. 05/08/18  Yes Mahala Menghini, PA-C  oxybutynin (DITROPAN) 5 MG tablet TAKE (1) TABLET BY MOUTH THREE TIMES DAILY 05/05/18  Yes Florian Buff, MD  oxyCODONE-acetaminophen (PERCOCET/ROXICET) 5-325 MG tablet  Take 1 tablet by mouth every 6 (six) hours as needed for severe pain. 03/31/18  Yes Johnson, Clanford L, MD  potassium chloride SA (K-DUR,KLOR-CON) 20 MEQ tablet Take 1 tablet (20 mEq total) by mouth every other day. 04/03/18  Yes Johnson, Clanford L, MD  pravastatin (PRAVACHOL) 40 MG tablet Take 40 mg by mouth daily.   Yes [provider]  predniSONE (DELTASONE) 10 MG tablet Taper dosage  6 tablets 1st day, 5 tablets 2nd day, 4 tablets 3rd day, 3 tablets 4th day, 2 tablets 2nd day then 1 tablet last day. 08/06/18  Yes Caryl Ada K, PA-C  ranitidine (ZANTAC) 150 MG tablet Take 150 mg by mouth at bedtime.  12/08/14  Yes [provider]  sertraline (ZOLOFT) 50 MG tablet  07/08/17  Yes [provider]  SPIRIVA HANDIHALER 18 MCG inhalation capsule Place 18 mcg into inhaler and inhale as needed.  08/19/17  Yes [provider]  traZODone (DESYREL) 150 MG tablet  10/01/17  Yes [provider]  levothyroxine (SYNTHROID, LEVOTHROID) 150 MCG tablet Take 1 tablet (150 mcg total) by mouth daily before breakfast for 30 days. 03/31/18 04/30/18  Murlean Iba, MD     Allergies:     Allergies  Allergen Reactions  . Tetracyclines & Related   . Shellfish-Derived Products Nausea And Vomiting     Physical Exam:   Vitals  Blood pressure (!) 115/51, pulse (!) 44, temperature 99.3 F (37.4 C), temperature source Rectal, resp. rate 17, height 5' 4" (1.626 m), weight 85 kg, SpO2 98 %.  1.  General: Appears in no acute distress  2. Psychiatric: Alert, c oriented x3, intact insight and judgment  3. Neurologic: Cranial nerves II through XII  grossly intact, no focal deficit noted  4. HEENMT:  Atraumatic normocephalic, extraocular muscles are intact  5. Respiratory : Clear to  auscultation bilaterally, no wheezing or crackles auscultated  6. Cardiovascular : S1-S2, regular, no murmur auscultated  7. Gastrointestinal:  Abdomen is soft, nontender, no organomegaly     Data Review:    CBC Recent Labs  Lab 08/16/18 0203  WBC 9.9  HGB 8.0*  HCT 27.4*  PLT 320  MCV 74.3*  MCH 21.7*  MCHC 29.2*  RDW 18.6*  LYMPHSABS 2.4  MONOABS 1.1*  EOSABS 0.7*  BASOSABS 0.1   ------------------------------------------------------------------------------------------------------------------  Results for orders placed or performed during the hospital encounter of 08/16/18 (from the past 48 hour(s))  CBC with Differential     Status: Abnormal   Collection Time: 08/16/18  2:03 AM  Result Value Ref Range   WBC 9.9 4.0 - 10.5 K/uL   RBC 3.69 (L) 3.87 - 5.11 MIL/uL   Hemoglobin 8.0 (L) 12.0 - 15.0 g/dL    Comment: Reticulocyte Hemoglobin testing may be clinically indicated, consider ordering this additional test [id number]    HCT 27.4 (L) 36.0 - 46.0 %   MCV 74.3 (L) 80.0 - 100.0 fL   MCH 21.7 (L) 26.0 - 34.0 pg   MCHC 29.2 (L) 30.0 - 36.0 g/dL   RDW 18.6 (H) 11.5 - 15.5 %   Platelets 320 150 - 400 K/uL   nRBC 0.9 (H) 0.0 - 0.2 %   Neutrophils Relative % 53 %   Neutro Abs 5.4 1.7 - 7.7 K/uL   Lymphocytes Relative 24 %   Lymphs Abs 2.4 0.7 - 4.0 K/uL   Monocytes Relative 12 %   Monocytes Absolute 1.1 (H) 0.1 - 1.0 K/uL   Eosinophils Relative 7 %  Eosinophils Absolute 0.7 (H) 0.0 - 0.5 K/uL   Basophils Relative 1 %   Basophils Absolute 0.1 0.0 - 0.1 K/uL   Immature Granulocytes 3 %   Abs Immature Granulocytes 0.25 (H) 0.00 - 0.07 K/uL    Comment: Performed at [facility], [address] [place], [place] [address]  Basic metabolic panel     Status: Abnormal   Collection Time: 08/16/18  2:03 AM  Result Value Ref Range   Sodium 133 (L) 135 - 145 mmol/L   Potassium 3.8 3.5 - 5.1 mmol/L   Chloride 93 (L) 98 - 111 mmol/L   CO2 22 22 - 32 mmol/L   Glucose,  Bld 129 (H) 70 - 99 mg/dL   BUN 45 (H) 8 - 23 mg/dL   Creatinine, Ser 2.92 (H) 0.44 - 1.00 mg/dL   Calcium 9.4 8.9 - 10.3 mg/dL   GFR calc non Af Amer 15 (L) >60 mL/min   GFR calc Af Amer 17 (L) >60 mL/min   Anion gap 18 (H) 5 - 15    Comment: Performed at [facility], [address] [place], [place] [address]  Troponin I - ONCE - STAT     Status: None   Collection Time: 08/16/18  2:03 AM  Result Value Ref Range   Troponin I <0.03 <0.03 ng/mL    Comment: Performed at [facility], [address] [place], [place] [address]  Urinalysis, Routine w reflex microscopic     Status: Abnormal   Collection Time: 08/16/18  3:30 AM  Result Value Ref Range   Color, Urine YELLOW YELLOW   APPearance HAZY (A) CLEAR   Specific Gravity, Urine 1.025 1.005 - 1.030   pH 5.0 5.0 - 8.0   Glucose, UA NEGATIVE NEGATIVE mg/dL   Hgb urine dipstick NEGATIVE NEGATIVE   Bilirubin Urine NEGATIVE NEGATIVE   Ketones, ur NEGATIVE NEGATIVE mg/dL   Protein, ur NEGATIVE NEGATIVE mg/dL   Nitrite NEGATIVE NEGATIVE   Leukocytes,Ua LARGE (A) NEGATIVE   RBC / HPF 0-5 0 - 5 RBC/hpf   WBC, UA >50 (H) 0 - 5 WBC/hpf   Bacteria, UA MANY (A) NONE SEEN   Squamous Epithelial / LPF 11-20 0 - 5   Hyaline Casts, UA PRESENT     Comment: Performed at [facility], [address] [place], [place] [address]    Chemistries  Recent Labs  Lab 08/16/18 0203  NA 133*  K 3.8  CL 93*  CO2 22  GLUCOSE 129*  BUN 45*  CREATININE 2.92*  CALCIUM 9.4   ------------------------------------------------------------------------------------------------------------------  ------------------------------------------------------------------------------------------------------------------ GFR: Estimated Creatinine Clearance: 17.6 mL/min (A) (by C-G formula based on SCr of 2.92 mg/dL (H)). Liver Function Tests: No results for input(s): AST, ALT, ALKPHOS, BILITOT, PROT, ALBUMIN in the last 168 hours. No results for input(s): LIPASE, AMYLASE  in the last 168 hours. No results for input(s): AMMONIA in the last 168 hours. Coagulation Profile: No results for input(s): INR, PROTIME in the last 168 hours. Cardiac Enzymes: Recent Labs  Lab 08/16/18 0203  TROPONINI <0.03   BNP (last 3 results) No results for input(s): PROBNP in the last 8760 hours. HbA1C: No results for input(s): HGBA1C in the last 72 hours. CBG: No results for input(s): GLUCAP in the last 168 hours. Lipid Profile: No results for input(s): CHOL, HDL, LDLCALC, TRIG, CHOLHDL, LDLDIRECT in the last 72 hours. Thyroid Function Tests: No results for input(s): TSH, T4TOTAL, FREET4, T3FREE, THYROIDAB in the last 72 hours. Anemia Panel: No results for input(s):  VITAMINB12, FOLATE, FERRITIN, TIBC, IRON, RETICCTPCT in the last 72 hours.  --------------------------------------------------------------------------------------------------------------- Urine analysis:    Component Value Date/Time   COLORURINE YELLOW 08/16/2018 0330   APPEARANCEUR HAZY (A) 08/16/2018 0330   LABSPEC 1.025 08/16/2018 0330   PHURINE 5.0 08/16/2018 0330   GLUCOSEU NEGATIVE 08/16/2018 0330   HGBUR NEGATIVE 08/16/2018 0330   BILIRUBINUR NEGATIVE 08/16/2018 0330   KETONESUR NEGATIVE 08/16/2018 0330   PROTEINUR NEGATIVE 08/16/2018 0330   UROBILINOGEN 0.2 09/19/2014 0105   NITRITE NEGATIVE 08/16/2018 0330   LEUKOCYTESUR LARGE (A) 08/16/2018 0330      Imaging Results:    Ct Head Wo Contrast  Result Date: 08/16/2018 CLINICAL DATA:  Head trauma EXAM: CT HEAD WITHOUT CONTRAST TECHNIQUE: Contiguous axial images were obtained from the base of the skull through the vertex without intravenous contrast. COMPARISON:  03/28/2018 FINDINGS: Brain: No acute territorial infarction, hemorrhage or intracranial mass. Mild atrophy and small vessel ischemic changes of the white matter. Chronic lacunar infarct left basal ganglia. Stable ventricle size Vascular: No hyperdense vessel.  Carotid vascular  calcification Skull: Normal. Negative for fracture or focal lesion. Sinuses/Orbits: No acute finding. Other: None IMPRESSION: 1. No CT evidence for acute intracranial abnormality. 2. Atrophy and mild small vessel ischemic changes of the white matter Electronically Signed   By: Donavan Foil M.D.   On: 08/16/2018 03:04   Dg Hip Unilat W Or Wo Pelvis 1 View Left  Result Date: 08/16/2018 CLINICAL DATA:  Fall with hip pain EXAM: DG HIP (WITH OR WITHOUT PELVIS) 1V*L* COMPARISON:  None. FINDINGS: SI joints are non widened. Pubic symphysis and rami appear intact. Both femoral heads project in joint. No acute displaced fracture or malalignment. Mild arthritis of the left hip. IMPRESSION: No acute osseous abnormality Electronically Signed   By: Donavan Foil M.D.   On: 08/16/2018 03:07    My personal review of EKG: Rhythm junctional rhythm   Assessment & Plan:    Active Problems:   UTI (urinary tract infection)   1. Near syncope-likely medication induced, patient is on both Cardizem 360 mg daily along with Coreg 12.5 mg p.o. twice daily.  Patient also found to have acute kidney injury, she is on Lasix at home.  Will hold both Cardizem and Coreg at this time.  Troponin less than 0.03.  Lasix  will also be on hold.  Will monitor patient on telemetry.  Echocardiogram was done in January 2020 which showed EF 65 to 08%, grade 1 diastolic dysfunction.  2. UTI-patient found to have abnormal UA, urine culture has been obtained.  Started on IV ceftriaxone.  Will follow urine culture results.  3. Bradycardia-patient's heart rate was found to be in 40s, EKG shows junctional rhythm.  As above we will hold both Cardizem and Coreg at this time.  Consider restarting Coreg once patient's heart rate improves.  4. Acute kidney injury-patient presented with creatinine of 2.92, will hold Lasix.  Likely due to hypotension due to Cardizem and Coreg.  Will start IV normal saline at 100 mL/h.  Follow BMP in  a.m.  5. Hypertension-patient blood pressure is soft, medications on hold as above.  Will start hydralazine  25 mg every 6 hours as needed for BP greater than 160/100.  6. History of DVT-continue Eliquis  7. Hypothyroidism-continue Synthroid  8. Chronic anemia-likely due to underlying CKD, hemoglobin is down to 8.0.  Baseline around 9-10.  Check FOBT, anemia panel.   DVT Prophylaxis-   Eliquis  AM Labs Ordered, also please review  Full Orders  Family Communication: Admission, patients condition and plan of care including tests being ordered have been discussed with the patient  who indicate understanding and agree with the plan and Code Status.  Code Status: Full code  Admission status: Inpatient: Based on patients clinical presentation and evaluation of above clinical data, I have made determination that patient meets Inpatient criteria at this time.  Time spent in minutes : 60 minutes   Oswald Hillock M.D on 08/16/2018 at 4:43 AM

## 2018-08-16 NOTE — ED Provider Notes (Signed)
[facility] EMERGENCY DEPARTMENT Provider Note   CSN: [id number] Arrival date & time: 08/16/18  0135    History   Chief Complaint Chief Complaint  Patient presents with  . Fall    HPI Connie Ponce is a 73 y.o. female.     HPI  This is a 73 year old female with a history of hypertension, renal insufficiency, stroke who presents with a fall.  Patient reports that she was bending over to get her pajamas when she fell backwards hitting her head and her left foot.  She remembers falling but cannot tell me whether she felt dizzy.  She does not believe she passed out.  She is on Eliquis.  Patient reports that over the last 2 to 3 weeks she has had more dizziness and has felt generally weak.  Denies any focal weakness.  Denies any fevers, shortness of breath, cough.  Patient reports left hip pain which she describes as AP.  She has been able to bear weight.  Of note, triage, patient heart rate noted to be 44.  EKG shows a junctional rhythm.  Patient denies any recent changes in medications.  She is on diltiazem and carvedilol.  She denies any chest pain.  Past Medical History:  Diagnosis Date  . Anxiety   . Carpal tunnel syndrome   . COPD (chronic obstructive pulmonary disease) ([place])   . Gastric ulcer   . GERD (gastroesophageal reflux disease)   . Hyperglycemia   . Hyperlipidemia   . Hypertension   . Hypothyroidism   . Osteoarthritis   . Peptic ulcer disease   . Pneumonia 2010  . Renal insufficiency   . 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 Stroke ([place]) 1990s   mild  . TIA (transient ischemic attack)     Patient Active Problem List   Diagnosis Date Noted  . Syncope and collapse 03/28/2018  . COPD (chronic obstructive pulmonary disease) ([place]) 03/28/2018  . Tobacco abuse 03/28/2018  . Fall at home 03/28/2018  . CKD (chronic kidney disease) stage 3, GFR 30-59 ml/min (HCC) 03/28/2018  . Anemia in chronic kidney disease (CKD) 03/28/2018  . Hyperlipidemia 03/28/2018  . Acute  metabolic encephalopathy 11/94/1740  . Gait instability 10/03/2017  . Dysarthria 10/03/2017  . Essential hypertension 10/03/2017  . Slurred speech   . Abdominal mass, RUQ (right upper quadrant) 03/13/2017  . Abdominal pain 03/13/2017  . RUQ pain 03/13/2017  . Dysphagia   . TIA (transient ischemic attack) 01/03/2016  . Absolute anemia 07/03/2015  . Proctalgia   . Rectal pain 01/04/2015  . Hemorrhoids 01/04/2015  . Diarrhea 01/04/2015  . Gastric ulceration   . Gastric ulcer 11/29/2014  . Toxic metabolic encephalopathy 81/44/8185  . UTI (urinary tract infection) 09/19/2014  . ARF (acute renal failure) ([place])   . LUQ pain 07/28/2014  . Nausea with vomiting 07/28/2014  . Bilateral lower extremity edema 07/28/2014  . Thrombocytopenia ([place]) 04/12/2014  . RLQ abdominal pain 01/22/2012  . Constipation 01/22/2012  . GERD (gastroesophageal reflux disease) 01/22/2012    Past Surgical History:  Procedure Laterality Date  . ABDOMINAL HYSTERECTOMY    . APPENDECTOMY    . BACK SURGERY    . BONE MARROW ASPIRATION Left 04/22/14  . BONE MARROW BIOPSY Left 04/22/14  . CHOLECYSTECTOMY    . COLONOSCOPY     ?[facility], [facility]  . COLONOSCOPY N/A 04/07/2015   UDJ:[id number]  . COLONOSCOPY WITH ESOPHAGOGASTRODUODENOSCOPY (EGD)  02/11/2012   RMR: Ulcerative/erosive reflux esophagitis, benign appearing gastric ulcer with  unremarkable biopsy, no H pylori. Colonoscopy was unremarkable. Next colonoscopy in 2023  . CYSTECTOMY     cyst removed from rectal area.   . ESOPHAGOGASTRODUODENOSCOPY  01/05/2003   RMR: Normal esophagus, small hiatal hernia/ A couple of tiny antral erosions, otherwise normal stomach normal D1 and D2.   . ESOPHAGOGASTRODUODENOSCOPY N/A 08/11/2014   XNT:[id number] ulcer/HH  . ESOPHAGOGASTRODUODENOSCOPY N/A 12/01/2014   SWH:[id number] improved gastric ulcerations/p bx  . ESOPHAGOGASTRODUODENOSCOPY N/A 01/31/2016   Procedure: ESOPHAGOGASTRODUODENOSCOPY (EGD);  Surgeon: Daneil Dolin, MD;  Location: [facility] ENDO SUITE;  Service: Endoscopy;  Laterality: N/A;  745  . HEMORRHOID SURGERY    . JOINT REPLACEMENT Right    knee  . MALONEY DILATION N/A 01/31/2016   Procedure: Venia Minks DILATION;  Surgeon: Daneil Dolin, MD;  Location: [facility] ENDO SUITE;  Service: Endoscopy;  Laterality: N/A;     OB History    Gravida  4   Para      Term      Preterm      AB      Living  3     SAB      TAB      Ectopic      Multiple      Live Births               Home Medications    Prior to Admission medications   Medication Sig Start Date End Date Taking? Authorizing Provider  albuterol (PROVENTIL HFA;VENTOLIN HFA) 108 (90 Base) MCG/ACT inhaler Inhale 2 puffs into the lungs every 4 (four) hours as needed for wheezing or shortness of breath. 08/10/17  Yes Noemi Chapel, MD  allopurinol (ZYLOPRIM) 100 MG tablet Take 100 mg by mouth as needed.  01/20/18  Yes [provider]  amoxicillin-clavulanate (AUGMENTIN) 875-125 MG tablet Take 1 tablet by mouth 2 (two) times daily. One po bid x 7 days 07/05/18  Yes Daleen Bo, MD  aspirin EC 81 MG EC tablet Take 2 tablets (162 mg total) by mouth daily. 01/05/16  Yes Thurnell Lose, MD  budesonide-formoterol [facility]) 160-4.5 MCG/ACT inhaler Inhale 2 puffs into the lungs 2 (two) times daily as needed (shortness of breath).    Yes [provider]  carvedilol (COREG) 12.5 MG tablet 12.5 mg 2 (two) times daily with a meal.  07/08/17  Yes [provider]  Cholecalciferol (VITAMIN D-3) 5000 UNITS TABS Take 1 tablet by mouth daily.   Yes [provider]  DEXILANT 60 MG capsule TAKE (1) CAPSULE BY MOUTH ONCE DAILY. 07/09/18  Yes Carlis Stable, NP  diltiazem (TIAZAC) 360 MG 24 hr capsule Take 360 mg by mouth daily.  01/29/18  Yes [provider]  ELIQUIS 5 MG TABS tablet Take 5 mg by mouth 2 (two) times daily.  01/29/18  Yes [provider]  furosemide (LASIX) 20 MG tablet Take 1 tablet  (20 mg total) by mouth every other day. 04/03/18  Yes Johnson, Clanford L, MD  gabapentin (NEURONTIN) 300 MG capsule 300 mg 3 (three) times daily.  07/08/17  Yes [provider]  hydrOXYzine (ATARAX/VISTARIL) 25 MG tablet Take 1 tablet (25 mg total) by mouth every 4 (four) hours as needed. 08/06/18  Yes Sofia, Leslie K, PA-C  MOVANTIK 12.5 MG TABS tablet TAKE 1 TABLET BY MOUTH ONCE DAILY. 05/08/18  Yes Mahala Menghini, PA-C  oxybutynin (DITROPAN) 5 MG tablet TAKE (1) TABLET BY MOUTH THREE TIMES DAILY 05/05/18  Yes Florian Buff, MD  oxyCODONE-acetaminophen (  PERCOCET/ROXICET) 5-325 MG tablet Take 1 tablet by mouth every 6 (six) hours as needed for severe pain. 03/31/18  Yes Johnson, Clanford L, MD  potassium chloride SA (K-DUR,KLOR-CON) 20 MEQ tablet Take 1 tablet (20 mEq total) by mouth every other day. 04/03/18  Yes Johnson, Clanford L, MD  pravastatin (PRAVACHOL) 40 MG tablet Take 40 mg by mouth daily.   Yes [provider]  predniSONE (DELTASONE) 10 MG tablet Taper dosage  6 tablets 1st day, 5 tablets 2nd day, 4 tablets 3rd day, 3 tablets 4th day, 2 tablets 2nd day then 1 tablet last day. 08/06/18  Yes Caryl Ada K, PA-C  ranitidine (ZANTAC) 150 MG tablet Take 150 mg by mouth at bedtime.  12/08/14  Yes [provider]  sertraline (ZOLOFT) 50 MG tablet  07/08/17  Yes [provider]  SPIRIVA HANDIHALER 18 MCG inhalation capsule Place 18 mcg into inhaler and inhale as needed.  08/19/17  Yes [provider]  traZODone (DESYREL) 150 MG tablet  10/01/17  Yes [provider]  levothyroxine (SYNTHROID, LEVOTHROID) 150 MCG tablet Take 1 tablet (150 mcg total) by mouth daily before breakfast for 30 days. 03/31/18 04/30/18  Murlean Iba, MD    Family History Family History  Problem Relation Age of Onset  . Other Mother        died age 57, natural causes  . Stroke Mother   . Diabetes Daughter   . Healthy Son   . Healthy Daughter   . Healthy Daughter    . Colon cancer Neg Hx   . Liver disease Neg Hx     Social History Social History   Tobacco Use  . Smoking status: Current Every Day Smoker    Packs/day: 1.50    Years: 58.00    Pack years: 87.00    Types: Cigarettes  . Smokeless tobacco: Never Used  Substance Use Topics  . Alcohol use: No    Alcohol/week: 0.0 standard drinks  . Drug use: No     Allergies   Tetracyclines & related and Shellfish-derived products   Review of Systems Review of Systems  Constitutional: Negative for fever.  Respiratory: Negative for shortness of breath.   Cardiovascular: Negative for chest pain.  Gastrointestinal: Negative for abdominal pain, nausea and vomiting.  Genitourinary: Negative for dysuria.  Musculoskeletal:       Hip pain  Skin: Negative for rash and wound.  Neurological: Positive for headaches.  Psychiatric/Behavioral: Positive for confusion.  All other systems reviewed and are negative.    Physical Exam Updated Vital Signs BP (!) 115/51   Pulse (!) 44   Temp 99.3 F (37.4 C) (Rectal)   Resp 17   Ht 1.626 m (5' 4" )   Wt 85 kg   SpO2 98%   BMI 32.17 kg/m   Physical Exam Vitals signs and nursing note reviewed.  Constitutional:      General: She is not in acute distress.    Appearance: She is well-developed. She is not ill-appearing.  HENT:     Head: Normocephalic and atraumatic.     Mouth/Throat:     Mouth: Mucous membranes are moist.  Eyes:     Pupils: Pupils are equal, round, and reactive to light.  Neck:     Musculoskeletal: Neck supple.  Cardiovascular:     Rate and Rhythm: Regular rhythm. Bradycardia present.     Heart sounds: Normal heart sounds.  Pulmonary:     Effort: Pulmonary effort is normal. No respiratory distress.  Breath sounds: No wheezing.  Abdominal:     General: Bowel sounds are normal.     Palpations: Abdomen is soft.  Musculoskeletal:     Right lower leg: No edema.     Left lower leg: No edema.     Comments: Normal range of  motion of the left hip, tenderness to palpation without deformity, 2+ DP pulses bilaterally  Skin:    General: Skin is warm and dry.  Neurological:     Mental Status: She is alert and oriented to person, place, and time.     Comments: 5 out of 5 strength in all 4 extremities, no dysmetria to finger-nose-finger, patient was noted to perseverate at times during exam.  Psychiatric:        Mood and Affect: Mood normal.     Comments: At times confused      ED Treatments / Results  Labs (all labs ordered are listed, but only abnormal results are displayed) Labs Reviewed  CBC WITH DIFFERENTIAL/PLATELET - Abnormal; Notable for the following components:      Result Value   RBC 3.69 (*)    Hemoglobin 8.0 (*)    HCT 27.4 (*)    MCV 74.3 (*)    MCH 21.7 (*)    MCHC 29.2 (*)    RDW 18.6 (*)    nRBC 0.9 (*)    Monocytes Absolute 1.1 (*)    Eosinophils Absolute 0.7 (*)    Abs Immature Granulocytes 0.25 (*)    All other components within normal limits  BASIC METABOLIC PANEL - Abnormal; Notable for the following components:   Sodium 133 (*)    Chloride 93 (*)    Glucose, Bld 129 (*)    BUN 45 (*)    Creatinine, Ser 2.92 (*)    GFR calc non Af Amer 15 (*)    GFR calc Af Amer 17 (*)    Anion gap 18 (*)    All other components within normal limits  URINALYSIS, ROUTINE W REFLEX MICROSCOPIC - Abnormal; Notable for the following components:   APPearance HAZY (*)    Leukocytes,Ua LARGE (*)    WBC, UA >50 (*)    Bacteria, UA MANY (*)    All other components within normal limits  SARS CORONAVIRUS 2 (HOSPITAL ORDER, [place] [facility])  URINE CULTURE  TROPONIN I  OCCULT BLOOD X 1 CARD TO LAB, STOOL    EKG EKG Interpretation  Date/Time:  Sunday Aug 16 2018 01:53:50 EDT Ventricular Rate:  42 PR Interval:    QRS Duration: 134 QT Interval:  512 QTC Calculation: 428 R Axis:   73 Text Interpretation:  Junctional rhythm Nonspecific intraventricular conduction delay  Minimal ST elevation, anterior leads Now with a junctional rhythm Confirmed by Thayer Jew [phone]) on 08/16/2018 1:58:16 AM   Radiology Ct Head Wo Contrast  Result Date: 08/16/2018 CLINICAL DATA:  Head trauma EXAM: CT HEAD WITHOUT CONTRAST TECHNIQUE: Contiguous axial images were obtained from the base of the skull through the vertex without intravenous contrast. COMPARISON:  03/28/2018 FINDINGS: Brain: No acute territorial infarction, hemorrhage or intracranial mass. Mild atrophy and small vessel ischemic changes of the white matter. Chronic lacunar infarct left basal ganglia. Stable ventricle size Vascular: No hyperdense vessel.  Carotid vascular calcification Skull: Normal. Negative for fracture or focal lesion. Sinuses/Orbits: No acute finding. Other: None IMPRESSION: 1. No CT evidence for acute intracranial abnormality. 2. Atrophy and mild small vessel ischemic changes of the white matter Electronically Signed  By: Donavan Foil M.D.   On: 08/16/2018 03:04   Dg Hip Unilat W Or Wo Pelvis 1 View Left  Result Date: 08/16/2018 CLINICAL DATA:  Fall with hip pain EXAM: DG HIP (WITH OR WITHOUT PELVIS) 1V*L* COMPARISON:  None. FINDINGS: SI joints are non widened. Pubic symphysis and rami appear intact. Both femoral heads project in joint. No acute displaced fracture or malalignment. Mild arthritis of the left hip. IMPRESSION: No acute osseous abnormality Electronically Signed   By: Donavan Foil M.D.   On: 08/16/2018 03:07    Procedures Procedures (including critical care time)  CRITICAL CARE Performed by: Merryl Hacker   Total critical care time: 31 minutes  Critical care time was exclusive of separately billable procedures and treating other patients.  Critical care was necessary to treat or prevent imminent or life-threatening deterioration.  Critical care was time spent personally by me on the following activities: development of treatment plan with patient and/or surrogate as well  as nursing, discussions with consultants, evaluation of patient's response to treatment, examination of patient, obtaining history from patient or surrogate, ordering and performing treatments and interventions, ordering and review of laboratory studies, ordering and review of radiographic studies, pulse oximetry and re-evaluation of patient's condition.   Medications Ordered in ED Medications  cefTRIAXone (ROCEPHIN) 1 g in sodium chloride 0.9 % 100 mL IVPB (1 g Intravenous New Bag/Given 08/16/18 0405)  sodium chloride 0.9 % bolus 1,000 mL (1,000 mLs Intravenous New Bag/Given 08/16/18 0344)  acetaminophen (TYLENOL) tablet 650 mg (650 mg Oral Given 08/16/18 0345)     Initial Impression / Assessment and Plan / ED Course  I have reviewed the triage vital signs and the nursing notes.  Pertinent labs & imaging results that were available during my care of the patient were reviewed by me and considered in my medical decision making (see chart for details).  Clinical Course as of Aug 16 422  Sun Aug 16, 2018  7408 Work-up reviewed.  Imaging negative.  BMP is notable for a creatinine of 2.92.  This is acutely elevated from prior.  BUN is 45.  This may be explanation for her episodes of perseveration.  No significant white count.  Hemoglobin is 8.0.  Trending downward.  On repeat exam, she states "I feel like I am eating enough."  Denies any recent fevers or illnesses.  She was given 1 L of fluids.  Of note, blood pressures have trended downwards.   [CH]    Clinical Course User Index [CH] Horton, Barbette Hair, MD       Patient presents following a fall.  She is overall nontoxic-appearing.  Vital signs notable for bradycardia.  EKG shows a junctional rhythm.  This is new when compared to prior.  She is on 2 rate controlling medications including diltiazem and carvedilol.  Work-up initiated.  Plain films of the left hip are negative.  CT head is negative for acute injury.  Hemoglobin is 8.  This is  downtrending.  Hemoccult at the bedside is negative.  Patient does have evidence of acute kidney injury with elevated creatinine and BUN.  BUN may be causing her slight perseveration on exam.  Patient was given a liter of fluids.  She also has evidence of UTI.  She is afebrile and does not appear septic.  Patient appears to have multiple things going on including dehydration with AKI, UTI, and bradycardia.  Would recommend admission for hydration and antibiotics.  She will likely need her cardiac medications  held given her bradycardia.  This was discussed with Dr. Darrick Meigs.  Final Clinical Impressions(s) / ED Diagnoses   Final diagnoses:  AKI (acute kidney injury) ([place])  Anemia, unspecified type  Bradycardia    ED Discharge Orders    None       Merryl Hacker, MD 08/16/18 [phone]

## 2018-08-17 ENCOUNTER — Inpatient Hospital Stay (HOSPITAL_COMMUNITY): Payer: Medicare Other

## 2018-08-17 DIAGNOSIS — R55 Syncope and collapse: Secondary | ICD-10-CM

## 2018-08-17 DIAGNOSIS — M48061 Spinal stenosis, lumbar region without neurogenic claudication: Secondary | ICD-10-CM

## 2018-08-17 DIAGNOSIS — Z720 Tobacco use: Secondary | ICD-10-CM

## 2018-08-17 LAB — RENAL FUNCTION PANEL
Albumin: 2.8 g/dL — ABNORMAL LOW (ref 3.5–5.0)
Anion gap: 9 (ref 5–15)
BUN: 18 mg/dL (ref 8–23)
CO2: 26 mmol/L (ref 22–32)
Calcium: 9 mg/dL (ref 8.9–10.3)
Chloride: 103 mmol/L (ref 98–111)
Creatinine, Ser: 1.01 mg/dL — ABNORMAL HIGH (ref 0.44–1.00)
GFR calc Af Amer: >60 mL/min (ref >60)
GFR calc non Af Amer: 54 mL/min — ABNORMAL LOW (ref >60)
Glucose, Bld: 105 mg/dL — ABNORMAL HIGH (ref 70–99)
Phosphorus: 2 mg/dL — ABNORMAL LOW (ref 2.5–4.6)
Potassium: 2.9 mmol/L — ABNORMAL LOW (ref 3.5–5.1)
Sodium: 138 mmol/L (ref 135–145)

## 2018-08-17 LAB — CBC
HCT: 25 % — ABNORMAL LOW (ref 36.0–46.0)
Hemoglobin: 7.2 g/dL — ABNORMAL LOW (ref 12.0–15.0)
MCH: 21.3 pg — ABNORMAL LOW (ref 26.0–34.0)
MCHC: 28.8 g/dL — ABNORMAL LOW (ref 30.0–36.0)
MCV: 74 fL — ABNORMAL LOW (ref 80.0–100.0)
Platelets: 280 10*3/uL (ref 150–400)
RBC: 3.38 MIL/uL — ABNORMAL LOW (ref 3.87–5.11)
RDW: 18.5 % — ABNORMAL HIGH (ref 11.5–15.5)
WBC: 5.5 10*3/uL (ref 4.0–10.5)
nRBC: 0.9 % — ABNORMAL HIGH (ref 0.0–0.2)

## 2018-08-17 LAB — MAGNESIUM: Magnesium: 1.8 mg/dL (ref 1.7–2.4)

## 2018-08-17 MED ORDER — POTASSIUM CHLORIDE CRYS ER 20 MEQ PO TBCR
20.0000 meq | EXTENDED_RELEASE_TABLET | Freq: Once | ORAL | Status: AC
  Administered 2018-08-17: 20 meq via ORAL
  Filled 2018-08-17: qty 1

## 2018-08-17 MED ORDER — POTASSIUM CHLORIDE CRYS ER 20 MEQ PO TBCR
60.0000 meq | EXTENDED_RELEASE_TABLET | Freq: Once | ORAL | Status: AC
  Administered 2018-08-17: 60 meq via ORAL
  Filled 2018-08-17: qty 3

## 2018-08-17 MED ORDER — GABAPENTIN 100 MG PO CAPS
200.0000 mg | ORAL_CAPSULE | Freq: Three times a day (TID) | ORAL | Status: DC
  Administered 2018-08-17 – 2018-08-19 (×7): 200 mg via ORAL
  Filled 2018-08-17 (×7): qty 2

## 2018-08-17 MED ORDER — DIPHENHYDRAMINE HCL 25 MG PO CAPS
25.0000 mg | ORAL_CAPSULE | Freq: Four times a day (QID) | ORAL | Status: DC | PRN
  Administered 2018-08-17: 50 mg via ORAL
  Filled 2018-08-17: qty 2

## 2018-08-17 MED ORDER — HYDROXYZINE HCL 25 MG PO TABS
25.0000 mg | ORAL_TABLET | Freq: Three times a day (TID) | ORAL | Status: DC | PRN
  Administered 2018-08-17: 25 mg via ORAL
  Filled 2018-08-17: qty 1

## 2018-08-17 MED ORDER — GUAIFENESIN-CODEINE 100-10 MG/5ML PO SOLN: 5.0000 mL | Freq: Four times a day (QID) | ORAL | Status: DC | PRN

## 2018-08-17 MED ORDER — PROMETHAZINE-CODEINE 6.25-10 MG/5ML PO SYRP: 5.0000 mL | ORAL_SOLUTION | Freq: Four times a day (QID) | ORAL | Status: DC | PRN

## 2018-08-17 MED ORDER — LORAZEPAM 0.5 MG PO TABS: 0.5000 mg | ORAL_TABLET | Freq: Once | ORAL | Status: DC | PRN

## 2018-08-17 MED ORDER — CARVEDILOL 3.125 MG PO TABS
3.1250 mg | ORAL_TABLET | Freq: Two times a day (BID) | ORAL | Status: DC
  Administered 2018-08-17 – 2018-08-19 (×5): 3.125 mg via ORAL
  Filled 2018-08-17 (×5): qty 1

## 2018-08-17 NOTE — Evaluation (Signed)
Physical Therapy Evaluation Patient Details Name: Connie BlakeDarlean Ponce MRN: [id number] DOB: 1946/01/23 Today's Date: 08/17/2018   History of Present Illness  Connie Chestine SporeClark  is a 73 y.o. female, with history of hypertension, hyperlipidemia, COPD, DVT on anticoagulation with Eliquis, CVA, ongoing tobacco abuse came to ED after patient fell at home.  Patient says that she got dizzy and fell backwards hitting her head and her left foot.  Patient denies passing out.  As per patient she has been dizzy over past 2 to 3 weeks and felt generally weak.  Denies nausea vomiting or diarrhea.  Denies chest pain or shortness of breath.  Denies fever or chills.  Does complain of cough.  Patient smokes half pack of cigarettes per day.  She describes left hip pain.    Clinical Impression  Patient functioning near baseline for functional mobility and gait, c/o severe low back pain when lying flat supine, demonstrated good return for lying down and sitting up without assist, very shaky during initial standing and able to ambulate in hallway without loss of balance.  Patient c/o baseline numbness in RLE that has increased since fall and new low back pain since fall - RN/MD notified.  Patient tolerated sitting up in chair after therapy.  Patient will benefit from continued physical therapy in hospital and recommended venue below to increase strength, balance, endurance for safe ADLs and gait.     Follow Up Recommendations Home health PT    Equipment Recommendations  None recommended by PT    Recommendations for Other Services       Precautions / Restrictions Precautions Precautions: Fall Restrictions Weight Bearing Restrictions: No      Mobility  Bed Mobility Overal bed mobility: Needs Assistance Bed Mobility: Supine to Sit;Sit to Supine     Supine to sit: Supervision Sit to supine: Supervision   General bed mobility comments: increased time, labored movement, increased low back pain  Transfers Overall  transfer level: Needs assistance Equipment used: Rolling walker (2 wheeled) Transfers: Sit to/from UGI CorporationStand;Stand Pivot Transfers Sit to Stand: Min guard Stand pivot transfers: Min guard       General transfer comment: increased time, labored movement, very trembly during initial standing that resolved   Ambulation/Gait Ambulation/Gait assistance: Min guard Gait Distance (Feet): 45 Feet Assistive device: Rolling walker (2 wheeled) Gait Pattern/deviations: Decreased step length - right;Decreased step length - left;Decreased stride length Gait velocity: decreased   General Gait Details: labored cadence without loss of balance, limited mostly due to fatigue and stating that RLE was giving out  Information systems managertairs            Wheelchair Mobility    Modified Rankin (Stroke Patients Only)       Balance Overall balance assessment: Needs assistance Sitting-balance support: Feet supported;No upper extremity supported Sitting balance-Leahy Scale: Fair Sitting balance - Comments: fair/good   Standing balance support: During functional activity;Bilateral upper extremity supported Standing balance-Leahy Scale: Fair Standing balance comment: using RW                             Pertinent Vitals/Pain Pain Assessment: 0-10 Pain Score: 9  Pain Location: low back, worse when lying flat, also c/o severe numbness RLE Pain Descriptors / Indicators: Aching;Numbness;Sore Pain Intervention(s): Limited activity within patient's tolerance;Monitored during session    Home Living Family/patient expects to be discharged to:: Private residence Living Arrangements: Spouse/significant other Available Help at Discharge: Family;Available PRN/intermittently Type of Home: Mobile home Home Access:  Stairs to enter Entrance Stairs-Rails: Right Entrance Stairs-Number of Steps: 3 Home Layout: One level Home Equipment: Walker - 2 wheels;Cane - single point;Bedside commode;Shower seat      Prior  Function Level of Independence: Independent with assistive device(s)         Comments: household ambulator with RW     Hand Dominance   Dominant Hand: Right    Extremity/Trunk Assessment   Upper Extremity Assessment Upper Extremity Assessment: Generalized weakness    Lower Extremity Assessment Lower Extremity Assessment: Generalized weakness    Cervical / Trunk Assessment Cervical / Trunk Assessment: Normal  Communication   Communication: No difficulties  Cognition Arousal/Alertness: Awake/alert Behavior During Therapy: WFL for tasks assessed/performed Overall Cognitive Status: Within Functional Limits for tasks assessed                                        General Comments      Exercises     Assessment/Plan    PT Assessment Patient needs continued PT services  PT Problem List Decreased strength;Decreased activity tolerance;Decreased balance;Decreased mobility       PT Treatment Interventions Therapeutic exercise;Gait training;Stair training;Functional mobility training;Therapeutic activities;Patient/family education    PT Goals (Current goals can be found in the Care Plan section)  Acute Rehab PT Goals Patient Stated Goal: return home with family to assist PT Goal Formulation: With patient Time For Goal Achievement: 08/21/18 Potential to Achieve Goals: Good    Frequency Min 3X/week   Barriers to discharge        Co-evaluation               AM-PAC PT "6 Clicks" Mobility  Outcome Measure Help needed turning from your back to your side while in a flat bed without using bedrails?: None Help needed moving from lying on your back to sitting on the side of a flat bed without using bedrails?: None Help needed moving to and from a bed to a chair (including a wheelchair)?: A Little Help needed standing up from a chair using your arms (e.g., wheelchair or bedside chair)?: A Little Help needed to walk in hospital room?: A Little Help  needed climbing 3-5 steps with a railing? : A Little 6 Click Score: 20    End of Session Equipment Utilized During Treatment: Gait belt Activity Tolerance: Patient tolerated treatment well;Patient limited by fatigue Patient left: in chair;with call bell/phone within reach;with chair alarm set Nurse Communication: Mobility status PT Visit Diagnosis: Unsteadiness on feet (R26.81);Other abnormalities of gait and mobility (R26.89);Muscle weakness (generalized) (M62.81)    Time: 2563-8937 PT Time Calculation (min) (ACUTE ONLY): 33 min   Charges:   PT Evaluation $PT Eval Moderate Complexity: 1 Mod PT Treatments $Therapeutic Activity: 23-37 mins        10:34 AM, 08/17/18 [name], MPT Physical Therapist with [facility] [phone] office [phone] mobile phone

## 2018-08-17 NOTE — Plan of Care (Signed)
  Problem: Acute Rehab PT Goals(only PT should resolve) Goal: Pt Will Go Supine/Side To Sit Outcome: Progressing Flowsheets (Taken 08/17/2018 1036) Pt will go Supine/Side to Sit: with modified independence Goal: Patient Will Transfer Sit To/From Stand Outcome: Progressing Flowsheets (Taken 08/17/2018 1036) Patient will transfer sit to/from stand: with supervision Goal: Pt Will Transfer Bed To Chair/Chair To Bed Outcome: Progressing Flowsheets (Taken 08/17/2018 1036) Pt will Transfer Bed to Chair/Chair to Bed: with supervision Goal: Pt Will Ambulate Outcome: Progressing Flowsheets (Taken 08/17/2018 1036) Pt will Ambulate: 75 feet; with supervision; with rolling walker   10:36 AM, 08/17/18 [name], MPT Physical Therapist with [facility] [phone] office [phone] mobile phone

## 2018-08-17 NOTE — Progress Notes (Signed)
PROGRESS NOTE    Bani Gianfrancesco  ZOX:[id number]  DOB: 1946/03/12  DOA: 08/16/2018 PCP: Erasmo Downer, NP   Brief Admission Hx: 73 year old female presented after falling at home and complaining of left hip pain, noted to be severely dehydrated and bradycardic secondary to her cardiac medications.  MDM/Assessment & Plan:   1. Syncope and fall-likely secondary to severe bradycardia associated with Cardizem 360 mg daily in addition to Coreg 12.5 mg twice daily.  The patient has been taken off Cardizem and Coreg until her heart rate improved.  Now that her heart rate is starting to rebound we have slowly restarted Coreg 3.125 mg twice daily.  Titrate as needed.  Her echocardiogram from January 2020 showed EF 65 to 70% with grade 1 diastolic dysfunction. 2. UTI- patient is being treated with IV ceftriaxone pending urine cultures. 3. Symptomatic bradycardia-likely secondary to cardiac medications.  Her heart rate was in the 30-40 range.  Her heart rate has improved to 90s. 4. Low back pain-patient reports severe lower back pain that is affecting her mobility at times.  Obtain MRI L-spine which show severe bulging and degenerative disc disease in the lumbar spine and spinal stenosis.  I have asked for her to follow-up with neurosurgery after discharge for consideration of therapies.  Continue pain management.  Patient does not seem to be interested in surgical therapy at this time but could be amenable to steroid injections. 5. Acute kidney injury-likely secondary to dehydration from Lasix.  We have temporarily held her Lasix.  Her renal function is slowly improving with IV fluid hydration. 6. Essential hypertension-initially patient had soft blood pressures and we have been holding her home blood pressure medication. 7. 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 8. Hypothyroidism-stable, continue Synthroid. 9. Anemia, unspecified- she appears to have an iron deficiency anemia which we  have started oral ferrous sulfate daily with breakfast.   DVT prophylaxis: Eliquis Code Status: Full Family Communication: Patient Disposition Plan: Continue inpatient care for IV fluids   Consultants:    Procedures:    Antimicrobials:     Subjective: Patient reports low back pain that radiates down into the right leg and numbness in the right foot and lower leg.  This has been ongoing and not an acute finding.  Objective: Vitals:   08/16/18 2007 08/16/18 2213 08/17/18 0505 08/17/18 1331  BP:  (!) 120/58 121/62 135/77  Pulse:  76 93 74  Resp:  Temp:  98.2 F (36.8 C) 97.6 F (36.4 C) 98.3 F (36.8 C)  TempSrc:  Oral Oral Oral  SpO2: 92% 96% 97% 100%  Weight:      Height:        Intake/Output Summary (Last 24 hours) at 08/17/2018 1332 Last data filed at 08/17/2018 8119 Gross per 24 hour  Intake 2880.53 ml  Output 2350 ml  Net 530.53 ml   Filed Weights   08/16/18 0145 08/16/18 0532  Weight: 85 kg 83.3 kg     REVIEW OF SYSTEMS  As per history otherwise all reviewed and reported negative  Exam:  General exam: Awake, alert, no apparent distress, cooperative sitting up in a chair. Respiratory system: Clear. No increased work of breathing. Cardiovascular system: S1 & S2 heard. No JVD, murmurs, gallops, clicks or pedal edema. Gastrointestinal system: Abdomen is nondistended, soft and nontender. Normal bowel sounds heard. Central nervous system: Alert and oriented. No focal neurological deficits. Extremities: no CCE.  Data Reviewed: Basic Metabolic Panel: Recent Labs  Lab 08/16/18 0203 08/16/18 0620 08/17/18 0541  NA 133* 132* 138  K 3.8 3.1* 2.9*  CL 93* 95* 103  CO2 22 24 26   GLUCOSE 129* 109* 105*  BUN 45* 39* 18  CREATININE 2.92* 2.32* 1.01*  CALCIUM 9.4 8.8* 9.0  MG  --  1.9 1.8  PHOS  --   --  2.0*   Liver Function Tests: Recent Labs  Lab 08/16/18 0620 08/17/18 0541  AST 19  --   ALT 19  --   ALKPHOS 67  --   BILITOT 0.2*   --   PROT 6.3*  --   ALBUMIN 3.3* 2.8*   No results for input(s): LIPASE, AMYLASE in the last 168 hours. No results for input(s): AMMONIA in the last 168 hours. CBC: Recent Labs  Lab 08/16/18 0203 08/16/18 0620 08/17/18 0541  WBC 9.9 9.4 5.5  NEUTROABS 5.4  --   --   HGB 8.0* 7.9* 7.2*  HCT 27.4* 27.4* 25.0*  MCV 74.3* 74.3* 74.0*  PLT 320 309 280   Cardiac Enzymes: Recent Labs  Lab 08/16/18 0203  TROPONINI <0.03   CBG (last 3)  No results for input(s): GLUCAP in the last 72 hours. Recent Results (from the past 240 hour(s))  Urine culture     Status: Abnormal (Preliminary result)   Collection Time: 08/16/18  3:30 AM  Result Value Ref Range Status   Specimen Description   Final    URINE, CLEAN CATCH Performed at [facility], [address] [place], [place] [address]    Special Requests   Final    NONE Performed at [facility], [address] [place], [place] [address]    Culture >=100,000 COLONIES/mL GRAM NEGATIVE RODS (A)  Final   Report Status PENDING  Incomplete  SARS Coronavirus 2 (CEPHEID - Performed in [facility] hospital lab), Hosp Order     Status: None   Collection Time: 08/16/18  3:47 AM  Result Value Ref Range Status   SARS Coronavirus 2 NEGATIVE NEGATIVE Final    Comment: (NOTE) If result is NEGATIVE SARS-CoV-2 target nucleic acids are NOT DETECTED. The SARS-CoV-2 RNA is generally detectable in upper and lower  respiratory specimens during the acute phase of infection. The lowest  concentration of SARS-CoV-2 viral copies this assay can detect is 250  copies / mL. A negative result does not preclude SARS-CoV-2 infection  and should not be used as the sole basis for treatment or other  patient management decisions.  A negative result may occur with  improper specimen collection / handling, submission of specimen other  than nasopharyngeal swab, presence of viral mutation(s) within the  areas targeted by this assay, and inadequate number of viral  copies  (<250 copies / mL). A negative result must be combined with clinical  observations, patient history, and epidemiological information. If result is POSITIVE SARS-CoV-2 target nucleic acids are DETECTED. The SARS-CoV-2 RNA is generally detectable in upper and lower  respiratory specimens dur ing the acute phase of infection.  Positive  results are indicative of active infection with SARS-CoV-2.  Clinical  correlation with patient history and other diagnostic information is  necessary to determine patient infection status.  Positive results do  not rule out bacterial infection or co-infection with other viruses. If result is PRESUMPTIVE POSTIVE SARS-CoV-2 nucleic acids MAY BE PRESENT.   A presumptive positive result was obtained on the submitted specimen  and confirmed on repeat testing.  While 2019 novel coronavirus  (SARS-CoV-2) nucleic acids  may be present in the submitted sample  additional confirmatory testing may be necessary for epidemiological  and / or clinical management purposes  to differentiate between  SARS-CoV-2 and other Sarbecovirus currently known to infect humans.  If clinically indicated additional testing with an alternate test  methodology [phone]) is advised. The SARS-CoV-2 RNA is generally  detectable in upper and lower respiratory sp ecimens during the acute  phase of infection. The expected result is Negative. Fact Sheet for Patients:  BoilerBrush.com.cy Fact Sheet for Healthcare Providers: https://pope.com/ This test is not yet approved or cleared by the [place] FDA and has been authorized for detection and/or diagnosis of SARS-CoV-2 by FDA under an Emergency Use Authorization (EUA).  This EUA will remain in effect (meaning this test can be used) for the duration of the COVID-19 declaration under Section 564(b)(1) of the Act, 21 U.S.C. section 360bbb-3(b)(1), unless the authorization is terminated  or revoked sooner. Performed at [facility], [address], [place], [place] [address]      Studies: Ct Head Wo Contrast  Result Date: 08/16/2018 CLINICAL DATA:  Head trauma EXAM: CT HEAD WITHOUT CONTRAST TECHNIQUE: Contiguous axial images were obtained from the base of the skull through the vertex without intravenous contrast. COMPARISON:  03/28/2018 FINDINGS: Brain: No acute territorial infarction, hemorrhage or intracranial mass. Mild atrophy and small vessel ischemic changes of the white matter. Chronic lacunar infarct left basal ganglia. Stable ventricle size Vascular: No hyperdense vessel.  Carotid vascular calcification Skull: Normal. Negative for fracture or focal lesion. Sinuses/Orbits: No acute finding. Other: None IMPRESSION: 1. No CT evidence for acute intracranial abnormality. 2. Atrophy and mild small vessel ischemic changes of the white matter Electronically Signed   By: Jasmine Pang M.D.   On: 08/16/2018 03:04   Mr Lumbar Spine Wo Contrast  Result Date: 08/17/2018 CLINICAL DATA:  Low back pain radiating down both legs for 2 weeks EXAM: MRI LUMBAR SPINE WITHOUT CONTRAST TECHNIQUE: Multiplanar, multisequence MR imaging of the lumbar spine was performed. No intravenous contrast was administered. COMPARISON:  10/29/2008 FINDINGS: Segmentation:  Standard. Alignment:  Physiologic. Vertebrae:  No fracture, evidence of discitis, or bone lesion. Conus medullaris and cauda equina: Conus extends to the L1 level. Conus and cauda equina appear normal. Paraspinal and other soft tissues: No acute paraspinal abnormality. Disc levels: Disc spaces: Degenerative disc disease with disc height loss at L3-4. Reactive endplate changes at L3-4. T12-L1: No significant disc bulge. No evidence of neural foraminal stenosis. No central canal stenosis. L1-L2: Minimal broad-based disc bulge. No evidence of neural foraminal stenosis. No central canal stenosis. L2-L3: Broad-based disc bulge eccentric towards the left.  Moderate bilateral facet arthropathy. Moderate spinal stenosis and bilateral lateral recess stenosis. Mild left foraminal narrowing. No right foraminal narrowing. L3-L4: Broad-based disc bulge. Moderate bilateral facet arthropathy. Severe spinal stenosis. Severe left and moderate right foraminal stenosis. L4-L5: Broad-based disc bulge. Prior laminectomy. Moderate bilateral facet arthropathy. Right lateral recess narrowing. Mild right foraminal stenosis. L5-S1: No significant disc bulge. No evidence of neural foraminal stenosis. No central canal stenosis. Moderate bilateral facet arthropathy. IMPRESSION: 1. At L2-3 there is a broad-based disc bulge eccentric towards the left. Moderate bilateral facet arthropathy. Moderate spinal stenosis and bilateral lateral recess stenosis. Mild left foraminal narrowing. No right foraminal narrowing. 2. At L3-4 there is a broad-based disc bulge. Moderate bilateral facet arthropathy. Severe spinal stenosis. Severe left and moderate right foraminal stenosis. 3. At L4-5 there is a broad-based disc bulge. Prior laminectomy. Moderate bilateral facet arthropathy. Right  lateral recess narrowing. Mild right foraminal stenosis. Electronically Signed   By: Elige KoHetal  Patel   On: 08/17/2018 12:09   Dg Hip Unilat W Or Wo Pelvis 1 View Left  Result Date: 08/16/2018 CLINICAL DATA:  Fall with hip pain EXAM: DG HIP (WITH OR WITHOUT PELVIS) 1V*L* COMPARISON:  None. FINDINGS: SI joints are non widened. Pubic symphysis and rami appear intact. Both femoral heads project in joint. No acute displaced fracture or malalignment. Mild arthritis of the left hip. IMPRESSION: No acute osseous abnormality Electronically Signed   By: Jasmine PangKim  Fujinaga M.D.   On: 08/16/2018 03:07     Scheduled Meds: . apixaban  5 mg Oral BID  . carvedilol  3.125 mg Oral BID WC  . ferrous sulfate  325 mg Oral Q breakfast  . fluticasone furoate-vilanterol  1 puff Inhalation Daily  . gabapentin  200 mg Oral TID  .  levothyroxine  150 mcg Oral QAC breakfast  . oxybutynin  5 mg Oral TID  . pantoprazole  40 mg Oral Daily  . pravastatin  40 mg Oral Daily  . sertraline  50 mg Oral Daily   Continuous Infusions: . sodium chloride 35 mL/hr at 08/17/18 0743  . cefTRIAXone (ROCEPHIN)  IV 1 g (08/17/18 0610)    Active Problems:   UTI (urinary tract infection)   Syncope and collapse   Tobacco abuse   AKI (acute kidney injury) (HCC)   Spinal stenosis of lumbar region  Time spent:   Standley Dakinslanford Damarrion Mimbs, MD [facility] 08/17/2018, 1:33 PM    LOS: 1 day  How to contact the [facility] Attending or Consulting provider 7A - 7P or covering provider during after hours 7P -7A, for this patient?  1. Check the care team in [facility] and look for a) attending/consulting TRH provider listed and b) the [facility] team listed 2. Log into [email or web address] and use [facility] universal password to access. If you do not have the password, please contact the hospital operator. 3. Locate the [facility] provider you are looking for under [facility] and page to a number that you can be directly reached. 4. If you still have difficulty reaching the provider, please page the [facility] (Director on Call) for the Hospitalists listed on amion for assistance.

## 2018-08-17 NOTE — TOC Initial Note (Signed)
Transition of Care [facility](TOC) - Initial/Assessment Note    Patient Details  Name: Connie BlakeDarlean Carne MRN: [id number] Date of Birth: 1946/02/22  Transition of Care [facility](TOC) CM/SW Contact:    Annice NeedySettle, Cathy Ropp D, LCSW Phone Number: 08/17/2018, 2:52 PM  Clinical Narrative:                 Patient from home with spouse. [facility] RN, aide). Patient's husband and grandson provide transport to appointments. Patient ambulates with a walker. She also has a bedside commode and shower chair in the home. Patient requires assistance from her aide with bathing.  [facility].   Expected Discharge Plan: Home w Home Health Services Barriers to Discharge: No Barriers Identified   Patient Goals and CMS Choice   CMS Medicare.gov Compare Post Acute Care list provided to:: Patient    Expected Discharge Plan and Services Expected Discharge Plan: Home w Home Health Services       Living arrangements for the past 2 months: Single Family Home Expected Discharge Date: 08/26/18                                    Prior Living Arrangements/Services Living arrangements for the past 2 months: Single Family Home Lives with:: Spouse   Do you feel safe going back to the place where you live?: Yes      Need for Family Participation in Patient Care: Yes (Comment) Care giver support system in place?: Yes (comment) Current home services: DME, Homehealth aide, Home PT, Home RN Criminal Activity/Legal Involvement Pertinent to Current Situation/Hospitalization: No - Comment as needed  Activities of Daily Living Home Assistive Devices/Equipment: None ADL Screening (condition at time of admission) Patient's cognitive ability adequate to safely complete daily activities?: Yes Is the patient deaf or have difficulty hearing?: No Does the patient have difficulty seeing, even when wearing glasses/contacts?: No Does the patient have difficulty  concentrating, remembering, or making decisions?: No Patient able to express need for assistance with ADLs?: Yes Does the patient have difficulty dressing or bathing?: No Independently performs ADLs?: Yes (appropriate for developmental age) Does the patient have difficulty walking or climbing stairs?: No Weakness of Legs: None Weakness of Arms/Hands: None  Permission Sought/Granted Permission sought to share information with : PCP, Family Supports, Other (comment)([facility]) Permission granted to share information with : Yes, Release of Information Signed     Permission granted to share info w AGENCY: [facility]        Emotional Assessment Appearance:: Appears stated age   Affect (typically observed): Appropriate Orientation: : Oriented to Self, Oriented to Place, Oriented to  Time, Oriented to Situation Alcohol / Substance Use: Not Applicable Psych Involvement: No (comment)  Admission diagnosis:  Bradycardia [R00.1] AKI (acute kidney injury) (HCC) [N17.9] Anemia, unspecified type [D64.9] Patient Active Problem List   Diagnosis Date Noted  . Spinal stenosis of lumbar region 08/17/2018  . AKI (acute kidney injury) (HCC)   . Syncope and collapse 03/28/2018  . COPD (chronic obstructive pulmonary disease) (HCC) 03/28/2018  . Tobacco abuse 03/28/2018  . Fall at home 03/28/2018  . CKD (chronic kidney disease) stage 3, GFR 30-59 ml/min (HCC) 03/28/2018  . Anemia in chronic kidney disease (CKD) 03/28/2018  . Hyperlipidemia 03/28/2018  . Acute metabolic encephalopathy 10/03/2017  . Gait instability 10/03/2017  . Dysarthria 10/03/2017  . Essential  hypertension 10/03/2017  . Slurred speech   . Abdominal mass, RUQ (right upper quadrant) 03/13/2017  . Abdominal pain 03/13/2017  . RUQ pain 03/13/2017  . Dysphagia   . TIA (transient ischemic attack) 01/03/2016  . Absolute anemia 07/03/2015  . Proctalgia   . Rectal pain 01/04/2015  . Hemorrhoids 01/04/2015  .  Diarrhea 01/04/2015  . Gastric ulceration   . Gastric ulcer 11/29/2014  . Toxic metabolic encephalopathy 09/20/2014  . UTI (urinary tract infection) 09/19/2014  . ARF (acute renal failure) (HCC)   . LUQ pain 07/28/2014  . Nausea with vomiting 07/28/2014  . Bilateral lower extremity edema 07/28/2014  . Thrombocytopenia (HCC) 04/12/2014  . RLQ abdominal pain 01/22/2012  . Constipation 01/22/2012  . GERD (gastroesophageal reflux disease) 01/22/2012   PCP:  Erasmo Downer, NP Pharmacy:   [facility] - [place], [place] - [address] [place] [address] Phone: [phone] Fax: [phone]     Social Determinants of Health (SDOH) Interventions    Readmission Risk Interventions Readmission Risk Prevention Plan 08/17/2018  Transportation Screening Complete  HRI or Home Care Consult Complete  Social Work Consult for Recovery Care Planning/Counseling Complete  Palliative Care Screening Not Applicable  Medication Review Oceanographer) Complete  Some recent data might be hidden

## 2018-08-18 ENCOUNTER — Inpatient Hospital Stay (HOSPITAL_COMMUNITY): Payer: Medicare Other

## 2018-08-18 LAB — RENAL FUNCTION PANEL
Albumin: 2.7 g/dL — ABNORMAL LOW (ref 3.5–5.0)
Anion gap: 9 (ref 5–15)
BUN: 10 mg/dL (ref 8–23)
CO2: 23 mmol/L (ref 22–32)
Calcium: 8.9 mg/dL (ref 8.9–10.3)
Chloride: 107 mmol/L (ref 98–111)
Creatinine, Ser: 0.78 mg/dL (ref 0.44–1.00)
GFR calc Af Amer: >60 mL/min (ref >60)
GFR calc non Af Amer: >60 mL/min (ref >60)
Glucose, Bld: 108 mg/dL — ABNORMAL HIGH (ref 70–99)
Phosphorus: 1.7 mg/dL — ABNORMAL LOW (ref 2.5–4.6)
Potassium: 3.8 mmol/L (ref 3.5–5.1)
Sodium: 139 mmol/L (ref 135–145)

## 2018-08-18 LAB — CBC
HCT: 24.2 % — ABNORMAL LOW (ref 36.0–46.0)
Hemoglobin: 6.9 g/dL — CL (ref 12.0–15.0)
MCH: 21.2 pg — ABNORMAL LOW (ref 26.0–34.0)
MCHC: 28.5 g/dL — ABNORMAL LOW (ref 30.0–36.0)
MCV: 74.2 fL — ABNORMAL LOW (ref 80.0–100.0)
Platelets: 216 10*3/uL (ref 150–400)
RBC: 3.26 MIL/uL — ABNORMAL LOW (ref 3.87–5.11)
RDW: 18.7 % — ABNORMAL HIGH (ref 11.5–15.5)
WBC: 5.1 10*3/uL (ref 4.0–10.5)
nRBC: 0.6 % — ABNORMAL HIGH (ref 0.0–0.2)

## 2018-08-18 LAB — URINE CULTURE: Culture: >=100000 — AB

## 2018-08-18 LAB — PREPARE RBC (CROSSMATCH)

## 2018-08-18 LAB — OCCULT BLOOD X 1 CARD TO LAB, STOOL: Fecal Occult Bld: NEGATIVE

## 2018-08-18 LAB — MAGNESIUM: Magnesium: 1.7 mg/dL (ref 1.7–2.4)

## 2018-08-18 LAB — ABO/RH: ABO/RH(D): O POS

## 2018-08-18 MED ORDER — FUROSEMIDE 20 MG PO TABS
10.0000 mg | ORAL_TABLET | Freq: Every day | ORAL | Status: DC
  Administered 2018-08-18 – 2018-08-19 (×2): 10 mg via ORAL
  Filled 2018-08-18 (×2): qty 1

## 2018-08-18 MED ORDER — CALAMINE EX LOTN
TOPICAL_LOTION | Freq: Three times a day (TID) | CUTANEOUS | Status: DC
  Administered 2018-08-18 – 2018-08-19 (×3): via TOPICAL
  Filled 2018-08-18: qty 177

## 2018-08-18 MED ORDER — SODIUM CHLORIDE 0.9% IV SOLUTION
Freq: Once | INTRAVENOUS | Status: AC
  Administered 2018-08-18: 13:00:00 via INTRAVENOUS

## 2018-08-18 NOTE — Progress Notes (Addendum)
PROGRESS NOTE  Connie Ponce  ZJQ:[id number]  DOB: 1945-09-21  DOA: 08/16/2018 PCP: Erasmo Downer, NP   Brief Admission Hx: 73 year old female presented after falling at home and complaining of left hip pain, noted to be severely dehydrated and bradycardic secondary to her cardiac medications.  MDM/Assessment & Plan:   1. Syncope and fall-likely secondary to severe bradycardia associated with Cardizem 360 mg daily in addition to Coreg 12.5 mg twice daily.  The patient initially taken off Cardizem and Coreg until her heart rate improved.  Now that her heart rate is starting to rebound we have slowly restarted Coreg 3.125 mg twice daily.  Titrate as needed.  Her echocardiogram from January 2020 showed EF 65 to 70% with grade 1 diastolic dysfunction.  PT has recommended HH PT.  2. Gram neg rod UTI- patient is being treated with IV ceftriaxone pending urine cultures and sensitivities. 3. Symptomatic bradycardia-likely secondary to cardiac medications.  Her heart rate was in the 30-40 range.  Her heart rate has improved to 90s. 4. Anemia - drop in Hg due to hemodilution from IV fluids, pt being transfused 1 unit PRBCs.  5. Low back pain-patient reports severe lower back pain that is affecting her mobility at times.  MRI L-spine done 08/17/18 which shows severe bulging and degenerative disc disease in the lumbar spine and spinal stenosis.  I have asked for her to follow-up with neurosurgery after discharge for consideration of therapies.  Continue pain management.  Patient does not seem to be interested in surgical therapy at this time but could be amenable to steroid injections. 6. URI with cough - suspect cough due to viral URI, check CXR, cough syrup ordered as needed.  Covid test was negative.  7. Acute kidney injury-likely secondary to dehydration from Lasix.  We temporarily held her Lasix.  Her renal function is improving. 8. COPD - continue home bronchodilator therapy.   9. Essential  hypertension-initially patient had soft blood pressures and we held her home blood pressure medication but have restarted low dose carvedilol. 10. 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 11. Hypothyroidism-stable, continue Synthroid. 12. Anemia, unspecified- she appears to have an iron deficiency anemia which we have started oral ferrous sulfate daily with breakfast.  DVT prophylaxis: Eliquis Code Status: Full Family Communication: Patient Disposition Plan: Continue inpatient care, home tomorrow if stable  Consultants:    Procedures:    Antimicrobials:     Subjective: Patient reports weakness and cough.  Back pain controlled.  Pt denies chills.  Pt denies chest pain and SOB.    Objective: Vitals:   08/17/18 1331 08/17/18 2142 08/18/18 0533 08/18/18 0822  BP: 135/77 (!) 113/33 133/75   Pulse: 74 82 89   Resp: 18 20 20    Temp: 98.3 F (36.8 C) 98.6 F (37 C) 98.9 F (37.2 C)   TempSrc: Oral Oral Oral   SpO2: 100% 98% 99% 98%  Weight:      Height:        Intake/Output Summary (Last 24 hours) at 08/18/2018 1234 Last data filed at 08/18/2018 0900 Gross per 24 hour  Intake 1673.67 ml  Output 700 ml  Net 973.67 ml   Filed Weights   08/16/18 0145 08/16/18 0532  Weight: 85 kg 83.3 kg   REVIEW OF SYSTEMS  As per history otherwise all reviewed and reported negative  Exam:  General exam: Awake, alert, no apparent distress, cooperative sitting up in a chair. Respiratory system: Clear. No increased work of breathing.  Cardiovascular system: S1 & S2 heard. No JVD, murmurs, gallops, clicks or pedal edema. Gastrointestinal system: Abdomen is nondistended, soft and nontender. Normal bowel sounds heard. Central nervous system: Alert and oriented. No focal neurological deficits. Extremities: no CCE.  Data Reviewed: Basic Metabolic Panel: Recent Labs  Lab 08/16/18 0203 08/16/18 0620 08/17/18 0541 08/18/18 0443  NA 133* 132* 138 139  K 3.8 3.1* 2.9* 3.8   CL 93* 95* 103 107  CO2 GLUCOSE 129* 109* 105* 108*  BUN 45* 39* 18 10  CREATININE 2.92* 2.32* 1.01* 0.78  CALCIUM 9.4 8.8* 9.0 8.9  MG  --  1.9 1.8 1.7  PHOS  --   --  2.0* 1.7*   Liver Function Tests: Recent Labs  Lab 08/16/18 0620 08/17/18 0541 08/18/18 0443  AST 19  --   --   ALT 19  --   --   ALKPHOS 67  --   --   BILITOT 0.2*  --   --   PROT 6.3*  --   --   ALBUMIN 3.3* 2.8* 2.7*   No results for input(s): LIPASE, AMYLASE in the last 168 hours. No results for input(s): AMMONIA in the last 168 hours. CBC: Recent Labs  Lab 08/16/18 0203 08/16/18 0620 08/17/18 0541 08/18/18 0443  WBC 9.9 9.4 5.5 5.1  NEUTROABS 5.4  --   --   --   HGB 8.0* 7.9* 7.2* 6.9*  HCT 27.4* 27.4* 25.0* 24.2*  MCV 74.3* 74.3* 74.0* 74.2*  PLT 320 309 280 216   Cardiac Enzymes: Recent Labs  Lab 08/16/18 0203  TROPONINI <0.03   CBG (last 3)  No results for input(s): GLUCAP in the last 72 hours. Recent Results (from the past 240 hour(s))  Urine culture     Status: Abnormal (Preliminary result)   Collection Time: 08/16/18  3:30 AM  Result Value Ref Range Status   Specimen Description   Final    URINE, CLEAN CATCH Performed at [facility], [address], [place], [place] [address]    Special Requests   Final    NONE Performed at [facility], [address] [place], [place] [address]    Culture >=100,000 COLONIES/mL GRAM NEGATIVE RODS (A)  Final   Report Status PENDING  Incomplete  SARS Coronavirus 2 (CEPHEID - Performed in [facility] hospital lab), Hosp Order     Status: None   Collection Time: 08/16/18  3:47 AM  Result Value Ref Range Status   SARS Coronavirus 2 NEGATIVE NEGATIVE Final    Comment: (NOTE) If result is NEGATIVE SARS-CoV-2 target nucleic acids are NOT DETECTED. The SARS-CoV-2 RNA is generally detectable in upper and lower  respiratory specimens during the acute phase of infection. The lowest  concentration of SARS-CoV-2 viral copies this assay  can detect is 250  copies / mL. A negative result does not preclude SARS-CoV-2 infection  and should not be used as the sole basis for treatment or other  patient management decisions.  A negative result may occur with  improper specimen collection / handling, submission of specimen other  than nasopharyngeal swab, presence of viral mutation(s) within the  areas targeted by this assay, and inadequate number of viral copies  (<250 copies / mL). A negative result must be combined with clinical  observations, patient history, and epidemiological information. If result is POSITIVE SARS-CoV-2 target nucleic acids are DETECTED. The SARS-CoV-2 RNA is generally detectable in upper and lower  respiratory specimens dur ing  the acute phase of infection.  Positive  results are indicative of active infection with SARS-CoV-2.  Clinical  correlation with patient history and other diagnostic information is  necessary to determine patient infection status.  Positive results do  not rule out bacterial infection or co-infection with other viruses. If result is PRESUMPTIVE POSTIVE SARS-CoV-2 nucleic acids MAY BE PRESENT.   A presumptive positive result was obtained on the submitted specimen  and confirmed on repeat testing.  While 2019 novel coronavirus  (SARS-CoV-2) nucleic acids may be present in the submitted sample  additional confirmatory testing may be necessary for epidemiological  and / or clinical management purposes  to differentiate between  SARS-CoV-2 and other Sarbecovirus currently known to infect humans.  If clinically indicated additional testing with an alternate test  methodology [phone]([id number]) is advised. The SARS-CoV-2 RNA is generally  detectable in upper and lower respiratory sp ecimens during the acute  phase of infection. The expected result is Negative. Fact Sheet for Patients:  BoilerBrush.com.cyhttps://www.fda.gov/media/136312/download Fact Sheet for Healthcare  Providers: https://pope.com/https://www.fda.gov/media/136313/download This test is not yet approved or cleared by the [place] FDA and has been authorized for detection and/or diagnosis of SARS-CoV-2 by FDA under an Emergency Use Authorization (EUA).  This EUA will remain in effect (meaning this test can be used) for the duration of the COVID-19 declaration under Section 564(b)(1) of the Act, 21 U.S.C. section 360bbb-3(b)(1), unless the authorization is terminated or revoked sooner. Performed at [facility], [address] [place], [place] [address]      Studies: Mr Lumbar Spine Wo Contrast  Result Date: 08/17/2018 CLINICAL DATA:  Low back pain radiating down both legs for 2 weeks EXAM: MRI LUMBAR SPINE WITHOUT CONTRAST TECHNIQUE: Multiplanar, multisequence MR imaging of the lumbar spine was performed. No intravenous contrast was administered. COMPARISON:  10/29/2008 FINDINGS: Segmentation:  Standard. Alignment:  Physiologic. Vertebrae:  No fracture, evidence of discitis, or bone lesion. Conus medullaris and cauda equina: Conus extends to the L1 level. Conus and cauda equina appear normal. Paraspinal and other soft tissues: No acute paraspinal abnormality. Disc levels: Disc spaces: Degenerative disc disease with disc height loss at L3-4. Reactive endplate changes at L3-4. T12-L1: No significant disc bulge. No evidence of neural foraminal stenosis. No central canal stenosis. L1-L2: Minimal broad-based disc bulge. No evidence of neural foraminal stenosis. No central canal stenosis. L2-L3: Broad-based disc bulge eccentric towards the left. Moderate bilateral facet arthropathy. Moderate spinal stenosis and bilateral lateral recess stenosis. Mild left foraminal narrowing. No right foraminal narrowing. L3-L4: Broad-based disc bulge. Moderate bilateral facet arthropathy. Severe spinal stenosis. Severe left and moderate right foraminal stenosis. L4-L5: Broad-based disc bulge. Prior laminectomy. Moderate bilateral facet  arthropathy. Right lateral recess narrowing. Mild right foraminal stenosis. L5-S1: No significant disc bulge. No evidence of neural foraminal stenosis. No central canal stenosis. Moderate bilateral facet arthropathy. IMPRESSION: 1. At L2-3 there is a broad-based disc bulge eccentric towards the left. Moderate bilateral facet arthropathy. Moderate spinal stenosis and bilateral lateral recess stenosis. Mild left foraminal narrowing. No right foraminal narrowing. 2. At L3-4 there is a broad-based disc bulge. Moderate bilateral facet arthropathy. Severe spinal stenosis. Severe left and moderate right foraminal stenosis. 3. At L4-5 there is a broad-based disc bulge. Prior laminectomy. Moderate bilateral facet arthropathy. Right lateral recess narrowing. Mild right foraminal stenosis. Electronically Signed   By: Elige KoHetal  Patel   On: 08/17/2018 12:09   Dg Chest Port 1 View  Result Date: 08/18/2018 CLINICAL DATA:  Mild cough.  Chest congestion. EXAM:  PORTABLE CHEST 1 VIEW COMPARISON:  03/28/2018. FINDINGS: Mediastinum hilar structures normal. Cardiomegaly with mild pulmonary venous congestion. No overt pulmonary alveolar edema. Tiny left pleural effusion cannot be excluded. No pneumothorax. IMPRESSION: Cardiomegaly with mild pulmonary venous congestion. No overt pulmonary alveolar edema. Tiny left pleural effusion cannot be excluded. Electronically Signed   By: Maisie Fus  Register   On: 08/18/2018 09:23   Scheduled Meds: . sodium chloride   Intravenous Once  . apixaban  5 mg Oral BID  . calamine   Topical TID  . carvedilol  3.125 mg Oral BID WC  . ferrous sulfate  325 mg Oral Q breakfast  . fluticasone furoate-vilanterol  1 puff Inhalation Daily  . furosemide  10 mg Oral Daily  . gabapentin  200 mg Oral TID  . levothyroxine  150 mcg Oral QAC breakfast  . oxybutynin  5 mg Oral TID  . pantoprazole  40 mg Oral Daily  . pravastatin  40 mg Oral Daily  . sertraline  50 mg Oral Daily   Continuous Infusions: .  sodium chloride 10 mL/hr at 08/18/18 0942  . cefTRIAXone (ROCEPHIN)  IV 1 g (08/18/18 0415)    Active Problems:   UTI (urinary tract infection)   Syncope and collapse   Tobacco abuse   AKI (acute kidney injury) (HCC)   Spinal stenosis of lumbar region  Time spent:   Standley Dakins, MD [facility] 08/18/2018, 12:34 PM    LOS: 2 days  How to contact the [facility] Attending or Consulting provider 7A - 7P or covering provider during after hours 7P -7A, for this patient?  1. Check the care team in [facility] and look for a) attending/consulting [facility] provider listed and b) the [facility] team listed 2. Log into [email or web address] and use [place]'s universal password to access. If you do not have the password, please contact the hospital operator. 3. Locate the [facility] provider you are looking for under [facility] and page to a number that you can be directly reached. 4. If you still have difficulty reaching the provider, please page the [facility] (Director on Call) for the Hospitalists listed on amion for assistance.

## 2018-08-18 NOTE — Progress Notes (Signed)
Physical Therapy Treatment Patient Details Name: Connie Ponce MRN: [id number] DOB: 1946-01-28 Today's Date: 08/18/2018    History of Present Illness Connie Ponce  is a 73 y.o. female, with history of hypertension, hyperlipidemia, COPD, DVT on anticoagulation with Eliquis, CVA, ongoing tobacco abuse came to ED after patient fell at home.  Patient says that she got dizzy and fell backwards hitting her head and her left foot.  Patient denies passing out.  As per patient she has been dizzy over past 2 to 3 weeks and felt generally weak.  Denies nausea vomiting or diarrhea.  Denies chest pain or shortness of breath.  Denies fever or chills.  Does complain of cough.  Patient smokes half pack of cigarettes per day.  She describes left hip pain.    PT Comments    Patient presents seated at bedside and agreeable for therapy.  Patient demonstrates increased endurance/distance for gait training without loss of balance, limited mostly due to left hip/low back discomfort and continues to c/o severe numbness in RLE.  Patient able to complete single knee to chest exercises with RLE without increase in numbness and pain.  Patient tolerated sitting up in chair after therapy.  Patient will benefit from continued physical therapy in hospital and recommended venue below to increase strength, balance, endurance for safe ADLs and gait.    Follow Up Recommendations  Home health PT     Equipment Recommendations  None recommended by PT    Recommendations for Other Services       Precautions / Restrictions Precautions Precautions: Fall Restrictions Weight Bearing Restrictions: No    Mobility  Bed Mobility Overal bed mobility: Modified Independent Bed Mobility: Supine to Sit;Sit to Supine     Supine to sit: Modified independent (Device/Increase time) Sit to supine: Modified independent (Device/Increase time)   General bed mobility comments: demonstrates improvement for supine<>sitting without use of  siderail  Transfers Overall transfer level: Needs assistance Equipment used: Rolling walker (2 wheeled) Transfers: Sit to/from [employer] CorporationStand;Stand Pivot Transfers Sit to Stand: Supervision Stand pivot transfers: Supervision       General transfer comment: no trembling noted during sit to stands, required less assistance  Ambulation/Gait Ambulation/Gait assistance: Supervision Gait Distance (Feet): 85 Feet Assistive device: Rolling walker (2 wheeled) Gait Pattern/deviations: Decreased step length - right;Decreased step length - left;Decreased stride length Gait velocity: decreased   General Gait Details: increased endurance/distance for ambulation with slightly labored cadence, no loss of balance, limited secondary to c/o fatigue, low back/left hip pain   Stairs             Wheelchair Mobility    Modified Rankin (Stroke Patients Only)       Balance Overall balance assessment: Needs assistance Sitting-balance support: Feet supported;No upper extremity supported Sitting balance-Leahy Scale: Good     Standing balance support: Bilateral upper extremity supported;During functional activity Standing balance-Leahy Scale: Fair Standing balance comment: using RW                            Cognition Arousal/Alertness: Awake/alert Behavior During Therapy: WFL for tasks assessed/performed Overall Cognitive Status: Within Functional Limits for tasks assessed                                        Exercises General Exercises - Lower Extremity Long Arc Quad: Seated;Strengthening;AROM;Both;10 reps Hip Flexion/Marching: Strengthening;Seated;AROM;Both;10 reps Toe  Raises: Seated;Strengthening;AROM;Both;10 reps Heel Raises: Seated;AROM;Strengthening;Both;10 reps Other Exercises Other Exercises: bilateral single knee to chest (SKTC) x 3 sets of 30 second holds    General Comments        Pertinent Vitals/Pain Pain Score: 9  Pain Location: low back  left hip, continue to have severe numbness RLE Pain Descriptors / Indicators: Sore;Numbness;Aching Pain Intervention(s): Limited activity within patient's tolerance;Monitored during session    Home Living                      Prior Function            PT Goals (current goals can now be found in the care plan section) Acute Rehab PT Goals Patient Stated Goal: return home with family to assist PT Goal Formulation: With patient Time For Goal Achievement: 08/21/18 Potential to Achieve Goals: Good Progress towards PT goals: Progressing toward goals    Frequency    Min 3X/week      PT Plan Current plan remains appropriate    Co-evaluation              AM-PAC PT "6 Clicks" Mobility   Outcome Measure  Help needed turning from your back to your side while in a flat bed without using bedrails?: None Help needed moving from lying on your back to sitting on the side of a flat bed without using bedrails?: None Help needed moving to and from a bed to a chair (including a wheelchair)?: A Little Help needed standing up from a chair using your arms (e.g., wheelchair or bedside chair)?: None Help needed to walk in hospital room?: A Little Help needed climbing 3-5 steps with a railing? : A Little 6 Click Score: 21    End of Session   Activity Tolerance: Patient tolerated treatment well;Patient limited by fatigue;Patient limited by pain Patient left: in chair;with call bell/phone within reach Nurse Communication: Mobility status PT Visit Diagnosis: Unsteadiness on feet (R26.81);Other abnormalities of gait and mobility (R26.89);Muscle weakness (generalized) (M62.81)     Time: 1610-9604 PT Time Calculation (min) (ACUTE ONLY): 25 min  Charges:  $Gait Training: 8-22 mins $Therapeutic Exercise: 8-22 mins                     11:00 AM, 08/18/18 [name], MPT Physical Therapist with [facility] [phone] office [phone] mobile phone

## 2018-08-19 LAB — BPAM RBC
Blood Product Expiration Date: 202007012359
ISSUE DATE / TIME: 202006021251
Unit Type and Rh: 5100

## 2018-08-19 LAB — TYPE AND SCREEN
ABO/RH(D): O POS
Antibody Screen: NEGATIVE
Unit division: 0

## 2018-08-19 LAB — HEMOGLOBIN AND HEMATOCRIT, BLOOD
HCT: 34.5 % — ABNORMAL LOW (ref 36.0–46.0)
Hemoglobin: 9.5 g/dL — ABNORMAL LOW (ref 12.0–15.0)

## 2018-08-19 MED ORDER — FERROUS SULFATE 325 (65 FE) MG PO TABS: 325.0000 mg | ORAL_TABLET | Freq: Every day | ORAL | 3 refills | Status: DC

## 2018-08-19 MED ORDER — FUROSEMIDE 20 MG PO TABS: 10.0000 mg | ORAL_TABLET | Freq: Every day | ORAL | 3 refills | Status: DC

## 2018-08-19 MED ORDER — CARVEDILOL 3.125 MG PO TABS: 3.1250 mg | ORAL_TABLET | Freq: Two times a day (BID) | ORAL | 3 refills | Status: DC

## 2018-08-19 NOTE — Discharge Summary (Signed)
Physician Discharge Summary  Connie Ponce WUJ:[id number] DOB: 1945/05/06 DOA: 08/16/2018  PCP: Erasmo Downer, NP  Admit date: 08/16/2018  Discharge date: 08/19/2018  Admitted From:Home  Disposition:  Home  Recommendations for Outpatient Follow-up:  1. Follow up with PCP in 1-2 weeks and repeat CBC in 1 week 2. Please schedule follow-up appointment with neurosurgery in 2 weeks to evaluate spinal stenosis 3. Remain on iron supplementation as prescribed 4. Remain on Lasix daily as prescribed 5. Coreg as prescribed at 3.125 mg twice a day and avoid further Cardizem.  Home Health: With PT, RN, home health aide  Equipment/Devices: None  Discharge Condition: Stable  CODE STATUS: Full  Diet recommendation: Heart Healthy  Brief/Interim Summary: This is a 73 year old African-American female who presented to the ED on 5/31 after falling at home and was noted to complain of left hip pain.  She was noted to be severely dehydrated and bradycardic secondary to her cardiac medications and therefore, her medications have been adjusted to Coreg now 3.125 mg daily and her Cardizem has been discontinued.  She has had no further incidents of bradycardia or hypotension.  She was also noted to have E. coli UTI and has completed a 3-day course of treatment on Rocephin.  She was also noted to be anemic secondary to hemodilution from IV fluid as well as some iron deficiency.  She had no overt bleeding otherwise identified and did receive 1 unit of PRBCs on 6/2.  Her hemoglobin on day of discharge is 9.5.  She has no further symptomatic complaints or concerns aside from low back pain that affects her mobility.  PT has evaluated patient and she is safe for discharge to home with home health physical therapy and will have further follow-up with neurosurgery in the outpatient setting as recommended.  MRI report as noted below.  She will continue her other medications to include Eliquis and Synthroid as previously  prescribed.  No other acute events have been noted during the course of this admission.  Discharge Diagnoses:  Active Problems:   UTI (urinary tract infection)   Syncope and collapse   Tobacco abuse   AKI (acute kidney injury) (HCC)   Spinal stenosis of lumbar region  Principal discharge diagnosis: Syncope and collapse secondary to severe bradycardia from overmedication as well as E. coli UTI.  Discharge Instructions  Discharge Instructions    Diet - low sodium heart healthy   Complete by:  As directed    Increase activity slowly   Complete by:  As directed      Allergies as of 08/19/2018      Reactions   Tetracyclines & Related    Shellfish-derived Products Nausea And Vomiting      Medication List    STOP taking these medications   diltiazem 360 MG 24 hr capsule Commonly known as:  TIAZAC   predniSONE 10 MG tablet Commonly known as:  DELTASONE     TAKE these medications   albuterol 108 (90 Base) MCG/ACT inhaler Commonly known as:  VENTOLIN HFA Inhale 2 puffs into the lungs every 4 (four) hours as needed for wheezing or shortness of breath.   allopurinol 100 MG tablet Commonly known as:  ZYLOPRIM Take 100 mg by mouth as needed.   amoxicillin-clavulanate 875-125 MG tablet Commonly known as:  Augmentin Take 1 tablet by mouth 2 (two) times daily. One po bid x 7 days   aspirin 81 MG EC tablet Take 2 tablets (162 mg total) by mouth daily.  budesonide-formoterol 160-4.5 MCG/ACT inhaler Commonly known as:  SYMBICORT Inhale 2 puffs into the lungs 2 (two) times daily as needed (shortness of breath).   carvedilol 3.125 MG tablet Commonly known as:  COREG Take 1 tablet (3.125 mg total) by mouth 2 (two) times daily with a meal for 30 days. What changed:    medication strength  how much to take  how to take this   Dexilant 60 MG capsule Generic drug:  dexlansoprazole TAKE (1) CAPSULE BY MOUTH ONCE DAILY.   Eliquis 5 MG Tabs tablet Generic drug:   apixaban Take 5 mg by mouth 2 (two) times daily.   ferrous sulfate 325 (65 FE) MG tablet Take 1 tablet (325 mg total) by mouth daily with breakfast for 30 days. Start taking on:  August 20, 2018   furosemide 20 MG tablet Commonly known as:  LASIX Take 0.5 tablets (10 mg total) by mouth daily for 30 days. Start taking on:  August 20, 2018 What changed:    how much to take  when to take this   gabapentin 300 MG capsule Commonly known as:  NEURONTIN 300 mg 3 (three) times daily.   hydrOXYzine 25 MG tablet Commonly known as:  ATARAX/VISTARIL Take 1 tablet (25 mg total) by mouth every 4 (four) hours as needed.   levothyroxine 150 MCG tablet Commonly known as:  SYNTHROID Take 1 tablet (150 mcg total) by mouth daily before breakfast for 30 days.   Movantik 12.5 MG Tabs tablet Generic drug:  naloxegol oxalate TAKE 1 TABLET BY MOUTH ONCE DAILY.   oxybutynin 5 MG tablet Commonly known as:  DITROPAN TAKE (1) TABLET BY MOUTH THREE TIMES DAILY   oxyCODONE-acetaminophen 5-325 MG tablet Commonly known as:  PERCOCET/ROXICET Take 1 tablet by mouth every 6 (six) hours as needed for severe pain.   potassium chloride SA 20 MEQ tablet Commonly known as:  K-DUR Take 1 tablet (20 mEq total) by mouth every other day.   pravastatin 40 MG tablet Commonly known as:  PRAVACHOL Take 40 mg by mouth daily.   ranitidine 150 MG tablet Commonly known as:  ZANTAC Take 150 mg by mouth at bedtime.   sertraline 50 MG tablet Commonly known as:  ZOLOFT   Spiriva HandiHaler 18 MCG inhalation capsule Generic drug:  tiotropium Place 18 mcg into inhaler and inhale as needed.   traZODone 150 MG tablet Commonly known as:  DESYREL   Vitamin D-3 125 MCG (5000 UT) Tabs Take 1 tablet by mouth daily.      Follow-up Information    [facility]. Schedule an appointment as soon as possible for a visit in 2 week(s).   Specialty:  Neurosurgery Why:  Establish care for spinal  stenosis  Contact information: [address] [place] [address] [phone]        Erasmo Downer, NP Follow up in 1 week(s).   Specialty:  Nurse Practitioner Contact information: [address] [place] [address] [phone]          Allergies  Allergen Reactions  . Tetracyclines & Related   . Shellfish-Derived Products Nausea And Vomiting    Consultations:  None   Procedures/Studies: Ct Head Wo Contrast  Result Date: 08/16/2018 CLINICAL DATA:  Head trauma EXAM: CT HEAD WITHOUT CONTRAST TECHNIQUE: Contiguous axial images were obtained from the base of the skull through the vertex without intravenous contrast. COMPARISON:  03/28/2018 FINDINGS: Brain: No acute territorial infarction, hemorrhage or intracranial mass. Mild  atrophy and small vessel ischemic changes of the white matter. Chronic lacunar infarct left basal ganglia. Stable ventricle size Vascular: No hyperdense vessel.  Carotid vascular calcification Skull: Normal. Negative for fracture or focal lesion. Sinuses/Orbits: No acute finding. Other: None IMPRESSION: 1. No CT evidence for acute intracranial abnormality. 2. Atrophy and mild small vessel ischemic changes of the white matter Electronically Signed   By: Jasmine Pang M.D.   On: 08/16/2018 03:04   Mr Lumbar Spine Wo Contrast  Result Date: 08/17/2018 CLINICAL DATA:  Low back pain radiating down both legs for 2 weeks EXAM: MRI LUMBAR SPINE WITHOUT CONTRAST TECHNIQUE: Multiplanar, multisequence MR imaging of the lumbar spine was performed. No intravenous contrast was administered. COMPARISON:  10/29/2008 FINDINGS: Segmentation:  Standard. Alignment:  Physiologic. Vertebrae:  No fracture, evidence of discitis, or bone lesion. Conus medullaris and cauda equina: Conus extends to the L1 level. Conus and cauda equina appear normal. Paraspinal and other soft tissues: No acute paraspinal abnormality. Disc levels: Disc spaces: Degenerative disc  disease with disc height loss at L3-4. Reactive endplate changes at L3-4. T12-L1: No significant disc bulge. No evidence of neural foraminal stenosis. No central canal stenosis. L1-L2: Minimal broad-based disc bulge. No evidence of neural foraminal stenosis. No central canal stenosis. L2-L3: Broad-based disc bulge eccentric towards the left. Moderate bilateral facet arthropathy. Moderate spinal stenosis and bilateral lateral recess stenosis. Mild left foraminal narrowing. No right foraminal narrowing. L3-L4: Broad-based disc bulge. Moderate bilateral facet arthropathy. Severe spinal stenosis. Severe left and moderate right foraminal stenosis. L4-L5: Broad-based disc bulge. Prior laminectomy. Moderate bilateral facet arthropathy. Right lateral recess narrowing. Mild right foraminal stenosis. L5-S1: No significant disc bulge. No evidence of neural foraminal stenosis. No central canal stenosis. Moderate bilateral facet arthropathy. IMPRESSION: 1. At L2-3 there is a broad-based disc bulge eccentric towards the left. Moderate bilateral facet arthropathy. Moderate spinal stenosis and bilateral lateral recess stenosis. Mild left foraminal narrowing. No right foraminal narrowing. 2. At L3-4 there is a broad-based disc bulge. Moderate bilateral facet arthropathy. Severe spinal stenosis. Severe left and moderate right foraminal stenosis. 3. At L4-5 there is a broad-based disc bulge. Prior laminectomy. Moderate bilateral facet arthropathy. Right lateral recess narrowing. Mild right foraminal stenosis. Electronically Signed   By: Elige Ko   On: 08/17/2018 12:09   Dg Chest Port 1 View  Result Date: 08/18/2018 CLINICAL DATA:  Mild cough.  Chest congestion. EXAM: PORTABLE CHEST 1 VIEW COMPARISON:  03/28/2018. FINDINGS: Mediastinum hilar structures normal. Cardiomegaly with mild pulmonary venous congestion. No overt pulmonary alveolar edema. Tiny left pleural effusion cannot be excluded. No pneumothorax. IMPRESSION:  Cardiomegaly with mild pulmonary venous congestion. No overt pulmonary alveolar edema. Tiny left pleural effusion cannot be excluded. Electronically Signed   By: Maisie Fus  Register   On: 08/18/2018 09:23   Dg Hip Unilat W Or Wo Pelvis 1 View Left  Result Date: 08/16/2018 CLINICAL DATA:  Fall with hip pain EXAM: DG HIP (WITH OR WITHOUT PELVIS) 1V*L* COMPARISON:  None. FINDINGS: SI joints are non widened. Pubic symphysis and rami appear intact. Both femoral heads project in joint. No acute displaced fracture or malalignment. Mild arthritis of the left hip. IMPRESSION: No acute osseous abnormality Electronically Signed   By: Jasmine Pang M.D.   On: 08/16/2018 03:07     Discharge Exam: Vitals:   08/19/18 0531 08/19/18 0817  BP: (!) 103/55   Pulse: 93   Resp: 20   Temp:    SpO2: 99% 99%   Vitals:  08/18/18 1523 08/18/18 2110 08/19/18 0531 08/19/18 0817  BP: 139/67 110/67 (!) 103/55   Pulse: 85 84 93   Resp: 20 16 20    Temp: 98.3 F (36.8 C) 98.4 F (36.9 C)    TempSrc: Oral Oral    SpO2: 99% 98% 99% 99%  Weight:      Height:        General: Pt is alert, awake, not in acute distress Cardiovascular: RRR, S1/S2 +, no rubs, no gallops Respiratory: CTA bilaterally, no wheezing, no rhonchi Abdominal: Soft, NT, ND, bowel sounds + Extremities: no edema, no cyanosis    The results of significant diagnostics from this hospitalization (including imaging, microbiology, ancillary and laboratory) are listed below for reference.     Microbiology: Recent Results (from the past 240 hour(s))  Urine culture     Status: Abnormal   Collection Time: 08/16/18  3:30 AM  Result Value Ref Range Status   Specimen Description   Final    URINE, CLEAN CATCH Performed at [facility], [address], [place], [place] [address]    Special Requests   Final    NONE Performed at [facility], [address] [place], [place] [address]    Culture >=100,000 COLONIES/mL ESCHERICHIA COLI (A)  Final    Report Status 08/18/2018 FINAL  Final   Organism ID, Bacteria ESCHERICHIA COLI (A)  Final      Susceptibility   Escherichia coli - MIC*    AMPICILLIN <=2 SENSITIVE Sensitive     CEFAZOLIN <=4 SENSITIVE Sensitive     CEFTRIAXONE <=1 SENSITIVE Sensitive     CIPROFLOXACIN <=0.25 SENSITIVE Sensitive     GENTAMICIN <=1 SENSITIVE Sensitive     IMIPENEM <=0.25 SENSITIVE Sensitive     NITROFURANTOIN <=16 SENSITIVE Sensitive     TRIMETH/SULFA <=20 SENSITIVE Sensitive     AMPICILLIN/SULBACTAM <=2 SENSITIVE Sensitive     PIP/TAZO <=4 SENSITIVE Sensitive     Extended ESBL NEGATIVE Sensitive     * >=100,000 COLONIES/mL ESCHERICHIA COLI  SARS Coronavirus 2 (CEPHEID - Performed in [facility] hospital lab), Hosp Order     Status: None   Collection Time: 08/16/18  3:47 AM  Result Value Ref Range Status   SARS Coronavirus 2 NEGATIVE NEGATIVE Final    Comment: (NOTE) If result is NEGATIVE SARS-CoV-2 target nucleic acids are NOT DETECTED. The SARS-CoV-2 RNA is generally detectable in upper and lower  respiratory specimens during the acute phase of infection. The lowest  concentration of SARS-CoV-2 viral copies this assay can detect is 250  copies / mL. A negative result does not preclude SARS-CoV-2 infection  and should not be used as the sole basis for treatment or other  patient management decisions.  A negative result may occur with  improper specimen collection / handling, submission of specimen other  than nasopharyngeal swab, presence of viral mutation(s) within the  areas targeted by this assay, and inadequate number of viral copies  (<250 copies / mL). A negative result must be combined with clinical  observations, patient history, and epidemiological information. If result is POSITIVE SARS-CoV-2 target nucleic acids are DETECTED. The SARS-CoV-2 RNA is generally detectable in upper and lower  respiratory specimens dur ing the acute phase of infection.  Positive  results are indicative of  active infection with SARS-CoV-2.  Clinical  correlation with patient history and other diagnostic information is  necessary to determine patient infection status.  Positive results do  not rule out bacterial infection or co-infection with other viruses.  If result is PRESUMPTIVE POSTIVE SARS-CoV-2 nucleic acids MAY BE PRESENT.   A presumptive positive result was obtained on the submitted specimen  and confirmed on repeat testing.  While 2019 novel coronavirus  (SARS-CoV-2) nucleic acids may be present in the submitted sample  additional confirmatory testing may be necessary for epidemiological  and / or clinical management purposes  to differentiate between  SARS-CoV-2 and other Sarbecovirus currently known to infect humans.  If clinically indicated additional testing with an alternate test  methodology [phone]([id number]) is advised. The SARS-CoV-2 RNA is generally  detectable in upper and lower respiratory sp ecimens during the acute  phase of infection. The expected result is Negative. Fact Sheet for Patients:  BoilerBrush.com.cyhttps://www.fda.gov/media/136312/download Fact Sheet for Healthcare Providers: https://pope.com/https://www.fda.gov/media/136313/download This test is not yet approved or cleared by the [place] FDA and has been authorized for detection and/or diagnosis of SARS-CoV-2 by FDA under an Emergency Use Authorization (EUA).  This EUA will remain in effect (meaning this test can be used) for the duration of the COVID-19 declaration under Section 564(b)(1) of the Act, 21 U.S.C. section 360bbb-3(b)(1), unless the authorization is terminated or revoked sooner. Performed at [facility], [address] [place], [place] [id number]      Labs: BNP (last 3 results) No results for input(s): BNP in the last 8760 hours. Basic Metabolic Panel: Recent Labs  Lab 08/16/18 0203 08/16/18 0620 08/17/18 0541 08/18/18 0443  NA 133* 132* 138 139  K 3.8 3.1* 2.9* 3.8  CL 93* 95* 103 107  CO2 22 24 26 23   GLUCOSE  129* 109* 105* 108*  BUN 45* 39* 18 10  CREATININE 2.92* 2.32* 1.01* 0.78  CALCIUM 9.4 8.8* 9.0 8.9  MG  --  1.9 1.8 1.7  PHOS  --   --  2.0* 1.7*   Liver Function Tests: Recent Labs  Lab 08/16/18 0620 08/17/18 0541 08/18/18 0443  AST 19  --   --   ALT 19  --   --   ALKPHOS 67  --   --   BILITOT 0.2*  --   --   PROT 6.3*  --   --   ALBUMIN 3.3* 2.8* 2.7*   No results for input(s): LIPASE, AMYLASE in the last 168 hours. No results for input(s): AMMONIA in the last 168 hours. CBC: Recent Labs  Lab 08/16/18 0203 08/16/18 0620 08/17/18 0541 08/18/18 0443 08/19/18 0922  WBC 9.9 9.4 5.5 5.1  --   NEUTROABS 5.4  --   --   --   --   HGB 8.0* 7.9* 7.2* 6.9* 9.5*  HCT 27.4* 27.4* 25.0* 24.2* 34.5*  MCV 74.3* 74.3* 74.0* 74.2*  --   PLT 320 309 280 216  --    Cardiac Enzymes: Recent Labs  Lab 08/16/18 0203  TROPONINI <0.03   BNP: Invalid input(s): POCBNP CBG: No results for input(s): GLUCAP in the last 168 hours. D-Dimer No results for input(s): DDIMER in the last 72 hours. Hgb A1c No results for input(s): HGBA1C in the last 72 hours. Lipid Profile No results for input(s): CHOL, HDL, LDLCALC, TRIG, CHOLHDL, LDLDIRECT in the last 72 hours. Thyroid function studies No results for input(s): TSH, T4TOTAL, T3FREE, THYROIDAB in the last 72 hours.  Invalid input(s): FREET3 Anemia work up No results for input(s): VITAMINB12, FOLATE, FERRITIN, TIBC, IRON, RETICCTPCT in the last 72 hours. Urinalysis    Component Value Date/Time   COLORURINE YELLOW 08/16/2018 0330   APPEARANCEUR HAZY (A) 08/16/2018 0330   LABSPEC  1.025 08/16/2018 0330   PHURINE 5.0 08/16/2018 0330   GLUCOSEU NEGATIVE 08/16/2018 0330   HGBUR NEGATIVE 08/16/2018 0330   BILIRUBINUR NEGATIVE 08/16/2018 0330   KETONESUR NEGATIVE 08/16/2018 0330   PROTEINUR NEGATIVE 08/16/2018 0330   UROBILINOGEN 0.2 09/19/2014 0105   NITRITE NEGATIVE 08/16/2018 0330   LEUKOCYTESUR LARGE (A) 08/16/2018 0330   Sepsis  Labs Invalid input(s): PROCALCITONIN,  WBC,  LACTICIDVEN Microbiology Recent Results (from the past 240 hour(s))  Urine culture     Status: Abnormal   Collection Time: 08/16/18  3:30 AM  Result Value Ref Range Status   Specimen Description   Final    URINE, CLEAN CATCH Performed at [facility], [address] [place], [place] [address]    Special Requests   Final    NONE Performed at [facility], [address], [place], [place] [address]    Culture >=100,000 COLONIES/mL ESCHERICHIA COLI (A)  Final   Report Status 08/18/2018 FINAL  Final   Organism ID, Bacteria ESCHERICHIA COLI (A)  Final      Susceptibility   Escherichia coli - MIC*    AMPICILLIN <=2 SENSITIVE Sensitive     CEFAZOLIN <=4 SENSITIVE Sensitive     CEFTRIAXONE <=1 SENSITIVE Sensitive     CIPROFLOXACIN <=0.25 SENSITIVE Sensitive     GENTAMICIN <=1 SENSITIVE Sensitive     IMIPENEM <=0.25 SENSITIVE Sensitive     NITROFURANTOIN <=16 SENSITIVE Sensitive     TRIMETH/SULFA <=20 SENSITIVE Sensitive     AMPICILLIN/SULBACTAM <=2 SENSITIVE Sensitive     PIP/TAZO <=4 SENSITIVE Sensitive     Extended ESBL NEGATIVE Sensitive     * >=100,000 COLONIES/mL ESCHERICHIA COLI  SARS Coronavirus 2 (CEPHEID - Performed in [facility] hospital lab), Hosp Order     Status: None   Collection Time: 08/16/18  3:47 AM  Result Value Ref Range Status   SARS Coronavirus 2 NEGATIVE NEGATIVE Final    Comment: (NOTE) If result is NEGATIVE SARS-CoV-2 target nucleic acids are NOT DETECTED. The SARS-CoV-2 RNA is generally detectable in upper and lower  respiratory specimens during the acute phase of infection. The lowest  concentration of SARS-CoV-2 viral copies this assay can detect is 250  copies / mL. A negative result does not preclude SARS-CoV-2 infection  and should not be used as the sole basis for treatment or other  patient management decisions.  A negative result may occur with  improper specimen collection / handling, submission of  specimen other  than nasopharyngeal swab, presence of viral mutation(s) within the  areas targeted by this assay, and inadequate number of viral copies  (<250 copies / mL). A negative result must be combined with clinical  observations, patient history, and epidemiological information. If result is POSITIVE SARS-CoV-2 target nucleic acids are DETECTED. The SARS-CoV-2 RNA is generally detectable in upper and lower  respiratory specimens dur ing the acute phase of infection.  Positive  results are indicative of active infection with SARS-CoV-2.  Clinical  correlation with patient history and other diagnostic information is  necessary to determine patient infection status.  Positive results do  not rule out bacterial infection or co-infection with other viruses. If result is PRESUMPTIVE POSTIVE SARS-CoV-2 nucleic acids MAY BE PRESENT.   A presumptive positive result was obtained on the submitted specimen  and confirmed on repeat testing.  While 2019 novel coronavirus  (SARS-CoV-2) nucleic acids may be present in the submitted sample  additional confirmatory testing may be necessary for epidemiological  and / or clinical  management purposes  to differentiate between  SARS-CoV-2 and other Sarbecovirus currently known to infect humans.  If clinically indicated additional testing with an alternate test  methodology [phone]) is advised. The SARS-CoV-2 RNA is generally  detectable in upper and lower respiratory sp ecimens during the acute  phase of infection. The expected result is Negative. Fact Sheet for Patients:  BoilerBrush.com.cy Fact Sheet for Healthcare Providers: https://pope.com/ This test is not yet approved or cleared by the [place] FDA and has been authorized for detection and/or diagnosis of SARS-CoV-2 by FDA under an Emergency Use Authorization (EUA).  This EUA will remain in effect (meaning this test can be used) for the  duration of the COVID-19 declaration under Section 564(b)(1) of the Act, 21 U.S.C. section 360bbb-3(b)(1), unless the authorization is terminated or revoked sooner. Performed at [facility], [address] [place], [place] [address]      Time coordinating discharge: 35 minutes  SIGNED:   Erick Blinks, DO [facility] 08/19/2018, 11:26 AM  If 7PM-7AM, please contact night-coverage [email or web address] Password [email or web address]

## 2018-08-19 NOTE — TOC Transition Note (Signed)
Transition of Care [facility]) - CM/SW Discharge Note   Patient Details  Name: Connie Ponce MRN: [id number] Date of Birth: 1945/06/20  Transition of Care [facility] CM/SW Contact:  Tanyla Stege, Chrystine Oiler, RN Phone Number: 08/19/2018, 2:38 PM   Clinical Narrative:   Patient discharging home. Home health resumption orders faxed to [facility]. HH.     Final next level of care: Home w Home Health Services Barriers to Discharge: No Barriers Identified   Patient Goals and CMS Choice   CMS Medicare.gov Compare Post Acute Care list provided to:: Patient       Readmission Risk Interventions Readmission Risk Prevention Plan 08/17/2018  Transportation Screening Complete  HRI or Home Care Consult Complete  Social Work Consult for Recovery Care Planning/Counseling Complete  Palliative Care Screening Not Applicable  Medication Review [employer]) Complete  Some recent data might be hidden

## 2018-08-19 NOTE — Progress Notes (Signed)
Patient discharged home today per MD orders. Patient vital signs WDL. IV removed and site WDL. Discharge Instructions including follow up appointments, medications, and education reviewed with patient. Patient verbalizes understanding.  

## 2018-08-19 NOTE — Plan of Care (Signed)
  Problem: Education: Goal: Knowledge of General Education information will improve Description: Including pain rating scale, medication(s)/side effects and non-pharmacologic comfort measures Outcome: Adequate for Discharge   Problem: Health Behavior/Discharge Planning: Goal: Ability to manage health-related needs will improve Outcome: Adequate for Discharge   Problem: Clinical Measurements: Goal: Ability to maintain clinical measurements within normal limits will improve Outcome: Adequate for Discharge   Problem: Activity: Goal: Risk for activity intolerance will decrease Outcome: Adequate for Discharge   Problem: Nutrition: Goal: Adequate nutrition will be maintained Outcome: Adequate for Discharge   Problem: Coping: Goal: Level of anxiety will decrease Outcome: Adequate for Discharge   

## 2018-08-19 NOTE — Care Management Important Message (Signed)
Important Message  Patient Details  Name: Connie Ponce MRN: [id number] Date of Birth: 06/23/1945   Medicare Important Message Given:  Yes    Corey Harold 08/19/2018, 12:55 PM

## 2018-08-30 DIAGNOSIS — E039 Hypothyroidism, unspecified: Secondary | ICD-10-CM | POA: Insufficient documentation

## 2018-08-30 DIAGNOSIS — L299 Pruritus, unspecified: Secondary | ICD-10-CM | POA: Insufficient documentation

## 2018-08-30 DIAGNOSIS — R21 Rash and other nonspecific skin eruption: Secondary | ICD-10-CM | POA: Insufficient documentation

## 2018-08-30 DIAGNOSIS — R7881 Bacteremia: Secondary | ICD-10-CM | POA: Insufficient documentation

## 2018-09-01 DIAGNOSIS — M109 Gout, unspecified: Secondary | ICD-10-CM | POA: Insufficient documentation

## 2018-09-08 ENCOUNTER — Other Ambulatory Visit: Payer: Self-pay | Admitting: Gastroenterology

## 2018-09-23 ENCOUNTER — Other Ambulatory Visit: Payer: Self-pay

## 2018-09-23 ENCOUNTER — Inpatient Hospital Stay (HOSPITAL_COMMUNITY): Payer: 59 | Attending: Hematology | Admitting: Hematology

## 2018-09-23 ENCOUNTER — Inpatient Hospital Stay (HOSPITAL_COMMUNITY): Payer: 59

## 2018-09-23 ENCOUNTER — Encounter (HOSPITAL_COMMUNITY): Payer: Self-pay | Admitting: Hematology

## 2018-09-23 VITALS — BP 142/82 | HR 74 | Temp 97.3°F | Resp 16 | Wt 187.2 lb

## 2018-09-23 DIAGNOSIS — Z86718 Personal history of other venous thrombosis and embolism: Secondary | ICD-10-CM | POA: Diagnosis not present

## 2018-09-23 DIAGNOSIS — Z7901 Long term (current) use of anticoagulants: Secondary | ICD-10-CM | POA: Insufficient documentation

## 2018-09-23 DIAGNOSIS — D696 Thrombocytopenia, unspecified: Secondary | ICD-10-CM

## 2018-09-23 DIAGNOSIS — I10 Essential (primary) hypertension: Secondary | ICD-10-CM | POA: Insufficient documentation

## 2018-09-23 DIAGNOSIS — D509 Iron deficiency anemia, unspecified: Secondary | ICD-10-CM | POA: Diagnosis not present

## 2018-09-23 DIAGNOSIS — N189 Chronic kidney disease, unspecified: Secondary | ICD-10-CM | POA: Diagnosis not present

## 2018-09-23 DIAGNOSIS — F17210 Nicotine dependence, cigarettes, uncomplicated: Secondary | ICD-10-CM | POA: Insufficient documentation

## 2018-09-23 LAB — RETICULOCYTES
Immature Retic Fract: 27.1 % — ABNORMAL HIGH (ref 2.3–15.9)
RBC.: 4.06 MIL/uL (ref 3.87–5.11)
Retic Count, Absolute: 97.4 10*3/uL (ref 19.0–186.0)
Retic Ct Pct: 2.4 % (ref 0.4–3.1)

## 2018-09-23 LAB — RENAL FUNCTION PANEL
Albumin: 3.5 g/dL (ref 3.5–5.0)
Anion gap: 7 (ref 5–15)
BUN: 19 mg/dL (ref 8–23)
CO2: 29 mmol/L (ref 22–32)
Calcium: 10 mg/dL (ref 8.9–10.3)
Chloride: 101 mmol/L (ref 98–111)
Creatinine, Ser: 0.95 mg/dL (ref 0.44–1.00)
GFR calc Af Amer: >60 mL/min (ref >60)
GFR calc non Af Amer: 59 mL/min — ABNORMAL LOW (ref >60)
Glucose, Bld: 94 mg/dL (ref 70–99)
Phosphorus: 3.3 mg/dL (ref 2.5–4.6)
Potassium: 4.1 mmol/L (ref 3.5–5.1)
Sodium: 137 mmol/L (ref 135–145)

## 2018-09-23 LAB — CBC WITH DIFFERENTIAL/PLATELET
Abs Immature Granulocytes: 0.01 10*3/uL (ref 0.00–0.07)
Basophils Absolute: 0.1 10*3/uL (ref 0.0–0.1)
Basophils Relative: 1 %
Eosinophils Absolute: 1.2 10*3/uL — ABNORMAL HIGH (ref 0.0–0.5)
Eosinophils Relative: 18 %
HCT: 35.7 % — ABNORMAL LOW (ref 36.0–46.0)
Hemoglobin: 10.6 g/dL — ABNORMAL LOW (ref 12.0–15.0)
Immature Granulocytes: 0 %
Lymphocytes Relative: 23 %
Lymphs Abs: 1.5 10*3/uL (ref 0.7–4.0)
MCH: 26.1 pg (ref 26.0–34.0)
MCHC: 29.7 g/dL — ABNORMAL LOW (ref 30.0–36.0)
MCV: 87.9 fL (ref 80.0–100.0)
Monocytes Absolute: 0.6 10*3/uL (ref 0.1–1.0)
Monocytes Relative: 9 %
Neutro Abs: 3.2 10*3/uL (ref 1.7–7.7)
Neutrophils Relative %: 49 %
Platelets: ADEQUATE 10*3/uL (ref 150–400)
RBC: 4.06 MIL/uL (ref 3.87–5.11)
RDW: 24.2 % — ABNORMAL HIGH (ref 11.5–15.5)
WBC: 6.5 10*3/uL (ref 4.0–10.5)
nRBC: 0 % (ref 0.0–0.2)

## 2018-09-23 LAB — IRON AND TIBC
Iron: 192 ug/dL — ABNORMAL HIGH (ref 28–170)
Saturation Ratios: 60 % — ABNORMAL HIGH (ref 10.4–31.8)
TIBC: 321 ug/dL (ref 250–450)
UIBC: 129 ug/dL

## 2018-09-23 LAB — FERRITIN: Ferritin: 58 ng/mL (ref 11–307)

## 2018-09-23 LAB — VITAMIN B12: Vitamin B-12: 335 pg/mL (ref 180–914)

## 2018-09-23 LAB — FOLATE: Folate: 14.1 ng/mL (ref >5.9)

## 2018-09-23 LAB — LACTATE DEHYDROGENASE: LDH: 184 U/L (ref 98–192)

## 2018-09-23 NOTE — Patient Instructions (Addendum)
[place] at [facility] Discharge Instructions  You were seen today by Dr. Delton Coombes. He went over your recent lab results. He will get lab work done today. He will scheduled you for IV iron. He will see you back in 4 weeks for labs and follow up.  STOP taking the iron pill.  Thank you for choosing [place] at [facility] to provide your oncology and hematology care.  To afford each patient quality time with our provider, please arrive at least 15 minutes before your scheduled appointment time.   If you have a lab appointment with the [facility] please come in thru the  Main Entrance and check in at the main information desk  You need to re-schedule your appointment should you arrive 10 or more minutes late.  We strive to give you quality time with our providers, and arriving late affects you and other patients whose appointments are after yours.  Also, if you no show three or more times for appointments you may be dismissed from the clinic at the providers discretion.     Again, thank you for choosing [facility].  Our hope is that these requests will decrease the amount of time that you wait before being seen by our physicians.       _____________________________________________________________  Should you have questions after your visit to [facility], please contact our office at [phone] between the hours of 8:00 a.m. and 4:30 p.m.  Voicemails left after 4:00 p.m. will not be returned until the following business day.  For prescription refill requests, have your pharmacy contact our office and allow 72 hours.    Cancer Center Support Programs:   > Cancer Support Group  2nd Tuesday of the month 1pm-2pm, Journey Room

## 2018-09-23 NOTE — Progress Notes (Signed)
Connie Ponce, [place] [id number]   CLINIC:  Medical Oncology/Hematology  PCP:  [name], NP [address] [place] [address] [phone]   REASON FOR VISIT:  Follow-up for microcytic anemia:     INTERVAL HISTORY:  Connie Ponce 73 y.o. female returns for follow-up of macrocytic anemia.  She was found to have a hemoglobin of 6.9 on 08/18/2018 and underwent 1 unit of PRBC.  She was reportedly admitted to [facility] with skin rash.  Denies any fevers, night sweats or weight loss in the last 6 months.  Denies any nausea vomiting or diarrhea.  She does have constipation since she started taking iron tablet daily.  She had to strain at stool and occasionally bleeds.  Leg swellings have been stable.  Appetite is 100%.  Energy levels are low at 25%.    REVIEW OF SYSTEMS:  Review of Systems  Constitutional: Positive for fatigue.  Cardiovascular: Positive for leg swelling.  Gastrointestinal: Positive for constipation.  All other systems reviewed and are negative.    PAST MEDICAL/SURGICAL HISTORY:  Past Medical History:  Diagnosis Date  . Anxiety   . Carpal tunnel syndrome   . COPD (chronic obstructive pulmonary disease) ([place])   . Coronary artery disease   . Gastric ulcer   . GERD (gastroesophageal reflux disease)   . Hyperglycemia   . Hyperlipidemia   . Hypertension   . Hypothyroidism   . Osteoarthritis   . Peptic ulcer disease   . Pneumonia 2010  . Renal insufficiency   . 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 Stroke ([place]) 1990s   mild  . TIA (transient ischemic attack)    Past Surgical History:  Procedure Laterality Date  . ABDOMINAL HYSTERECTOMY    . APPENDECTOMY    . BACK SURGERY    . BONE MARROW ASPIRATION Left 04/22/14  . BONE MARROW BIOPSY Left 04/22/14  . CHOLECYSTECTOMY    . COLONOSCOPY     ?2005, [place]  . COLONOSCOPY N/A 04/07/2015   LVD:[id number]  . COLONOSCOPY WITH ESOPHAGOGASTRODUODENOSCOPY (EGD)   02/11/2012   RMR: Ulcerative/erosive reflux esophagitis, benign appearing gastric ulcer with unremarkable biopsy, no H pylori. Colonoscopy was unremarkable. Next colonoscopy in 2023  . CYSTECTOMY     cyst removed from rectal area.   . ESOPHAGOGASTRODUODENOSCOPY  01/05/2003   RMR: Normal esophagus, small hiatal hernia/ A couple of tiny antral erosions, otherwise normal stomach normal D1 and D2.   . ESOPHAGOGASTRODUODENOSCOPY N/A 08/11/2014   MZT:[id number] ulcer/HH  . ESOPHAGOGASTRODUODENOSCOPY N/A 12/01/2014   VTX:[id number] improved gastric ulcerations/p bx  . ESOPHAGOGASTRODUODENOSCOPY N/A 01/31/2016   Procedure: ESOPHAGOGASTRODUODENOSCOPY (EGD);  Surgeon: Daneil Dolin, MD;  [facility];  Service: Endoscopy;  Laterality: N/A;  745  . HEMORRHOID SURGERY    . JOINT REPLACEMENT Right    knee  . MALONEY DILATION N/A 01/31/2016   Procedure: Venia Minks DILATION;  Surgeon: Daneil Dolin, MD;  Location: [facility] END[facility] SUITE;  Service: Endoscopy;  Laterality: N/A;     SOCIAL HISTORY:  Social History   Socioeconomic History  . Marital status: Married    Spouse name: Not on file  . Number of children: 3  . Years of education: Not on file  . Highest education level: Not on file  Occupational History  . Not on file  Social Needs  . Financial resource strain: Not hard at all  . Food insecurity    Worry: Never true  Inability: Never true  . Transportation needs    Medical: No    Non-medical: No  Tobacco Use  . Smoking status: Current Every Day Smoker    Packs/day: 1.50    Years: 58.00    Pack years: 87.00    Types: Cigarettes  . Smokeless tobacco: Never Used  Substance and Sexual Activity  . Alcohol use: No    Alcohol/week: 0.0 standard drinks  . Drug use: No  . Sexual activity: Not Currently  Lifestyle  . Physical activity    Days per week: 1 day    Minutes per session: 10 min  . Stress: Only a little  Relationships  . Social connections    Talks on phone: More  than three times a week    Gets together: Twice a week    Attends religious service: More than 4 times per year    Active member of club or organization: No    Attends meetings of clubs or organizations: Never    Relationship status: Married  . Intimate partner violence    Fear of current or ex partner: No    Emotionally abused: No    Physically abused: No    Forced sexual activity: No  Other Topics Concern  . Not on file  Social History Narrative  . Not on file    FAMILY HISTORY:  Family History  Problem Relation Age of Onset  . Other Mother        died age 71, natural causes  . Stroke Mother   . Diabetes Daughter   . Healthy Son   . Healthy Daughter   . Healthy Daughter   . Colon cancer Neg Hx   . Liver disease Neg Hx     CURRENT MEDICATIONS:  Outpatient Encounter Medications as of 09/23/2018  Medication Sig  . allopurinol (ZYLOPRIM) 100 MG tablet Take 100 mg by mouth as needed.   Marland Kitchen aspirin EC 81 MG EC tablet Take 2 tablets (162 mg total) by mouth daily.  . budesonide-formoterol (SYMBICORT) 160-4.5 MCG/ACT inhaler Inhale 2 puffs into the lungs 2 (two) times daily as needed (shortness of breath).   . carvedilol (COREG) 3.125 MG tablet Take 1 tablet (3.125 mg total) by mouth 2 (two) times daily with a meal for 30 days.  . Cholecalciferol (VITAMIN D-3) 5000 UNITS TABS Take 1 tablet by mouth daily.  Marland Kitchen ELIQUIS 5 MG TABS tablet Take 5 mg by mouth 2 (two) times daily.   . ferrous sulfate 325 (65 FE) MG tablet Take 1 tablet (325 mg total) by mouth daily with breakfast for 30 days.  . furosemide (LASIX) 20 MG tablet Take 0.5 tablets (10 mg total) by mouth daily for 30 days.  Marland Kitchen gabapentin (NEURONTIN) 300 MG capsule 300 mg 3 (three) times daily.   Marland Kitchen GNP BACITRACIN ZINC ointment   . levothyroxine (SYNTHROID, LEVOTHROID) 150 MCG tablet Take 1 tablet (150 mcg total) by mouth daily before breakfast for 30 days.  . montelukast (SINGULAIR) 10 MG tablet   . oxybutynin (DITROPAN) 5 MG  tablet TAKE (1) TABLET BY MOUTH THREE TIMES DAILY  . oxyCODONE-acetaminophen (PERCOCET/ROXICET) 5-325 MG tablet Take 1 tablet by mouth every 6 (six) hours as needed for severe pain.  . potassium chloride SA (K-DUR,KLOR-CON) 20 MEQ tablet Take 1 tablet (20 mEq total) by mouth every other day.  . traZODone (DESYREL) 150 MG tablet   . triamterene-hydrochlorothiazide (MAXZIDE-25) 37.5-25 MG tablet   . [DISCONTINUED] sertraline (ZOLOFT) 50 MG tablet   .  albuterol (PROVENTIL HFA;VENTOLIN HFA) 108 (90 Base) MCG/ACT inhaler Inhale 2 puffs into the lungs every 4 (four) hours as needed for wheezing or shortness of breath. (Patient not taking: Reported on 09/23/2018)  . [DISCONTINUED] amoxicillin-clavulanate (AUGMENTIN) 875-125 MG tablet Take 1 tablet by mouth 2 (two) times daily. One po bid x 7 days (Patient not taking: Reported on 08/16/2018)  . [DISCONTINUED] DEXILANT 60 MG capsule TAKE (1) CAPSULE BY MOUTH ONCE DAILY.  . [DISCONTINUED] hydrOXYzine (ATARAX/VISTARIL) 25 MG tablet Take 1 tablet (25 mg total) by mouth every 4 (four) hours as needed.  . [DISCONTINUED] MOVANTIK 12.5 MG TABS tablet TAKE 1 TABLET BY MOUTH ONCE DAILY.  . [DISCONTINUED] pravastatin (PRAVACHOL) 40 MG tablet Take 40 mg by mouth daily.  . [DISCONTINUED] ranitidine (ZANTAC) 150 MG tablet Take 150 mg by mouth at bedtime.   . [DISCONTINUED] SPIRIVA HANDIHALER 18 MCG inhalation capsule Place 18 mcg into inhaler and inhale as needed.    No facility-administered encounter medications on file as of 09/23/2018.     ALLERGIES:  Allergies  Allergen Reactions  . Tetracyclines & Related   . Shellfish-Derived Products Nausea And Vomiting     PHYSICAL EXAM:  ECOG Performance status: 1  Vitals:   09/23/18 1000  BP: (!) 142/82  Pulse: 74  Resp: 16  Temp: (!) 97.3 F (36.3 C)  SpO2: 98%   Filed Weights   09/23/18 1000  Weight: 187 lb 4 oz (84.9 kg)    Physical Exam Vitals signs reviewed.  Constitutional:      Appearance: Normal  appearance.  Cardiovascular:     Rate and Rhythm: Normal rate and regular rhythm.     Heart sounds: Normal heart sounds.  Pulmonary:     Effort: Pulmonary effort is normal.     Breath sounds: Normal breath sounds.  Abdominal:     General: There is no distension.     Palpations: Abdomen is soft. There is no mass.  Musculoskeletal:     Right lower leg: Edema present.     Left lower leg: Edema present.  Skin:    General: Skin is warm.  Neurological:     General: No focal deficit present.     Mental Status: She is alert and oriented to person, place, and time.  Psychiatric:        Mood and Affect: Mood normal.        Behavior: Behavior normal.      LABORATORY DATA:  I have reviewed the labs as listed.  CBC    Component Value Date/Time   WBC 5.1 08/18/2018 0443   RBC 3.26 (L) 08/18/2018 0443   HGB 9.5 (L) 08/19/2018 0922   HCT 34.5 (L) 08/19/2018 0922   PLT 216 08/18/2018 0443   MCV 74.2 (L) 08/18/2018 0443   MCH 21.2 (L) 08/18/2018 0443   MCHC 28.5 (L) 08/18/2018 0443   RDW 18.7 (H) 08/18/2018 0443   LYMPHSABS 2.4 08/16/2018 0203   MONOABS 1.1 (H) 08/16/2018 0203   EOSABS 0.7 (H) 08/16/2018 0203   BASOSABS 0.1 08/16/2018 0203   CMP Latest Ref Rng & Units 08/18/2018 08/17/2018 08/16/2018  Glucose 70 - 99 mg/dL 108(H) 105(H) 109(H)  BUN 8 - 23 mg/dL 10 18 39(H)  Creatinine 0.44 - 1.00 mg/dL 0.78 1.01(H) 2.32(H)  Sodium 135 - 145 mmol/L 139 138 132(L)  Potassium 3.5 - 5.1 mmol/L 3.8 2.9(L) 3.1(L)  Chloride 98 - 111 mmol/L 107 103 95(L)  CO2 22 - 32 mmol/L 23 26 24   Calcium  8.9 - 10.3 mg/dL 8.9 9.0 8.8(L)  Total Protein 6.5 - 8.1 g/dL - - 6.3(L)  Total Bilirubin 0.3 - 1.2 mg/dL - - 0.2(L)  Alkaline Phos 38 - 126 U/L - - 67  AST 15 - 41 U/L - - 19  ALT 0 - 44 U/L - - 19       DIAGNOSTIC IMAGING:  I have independently reviewed the scans and discussed with the patient.   I have reviewed Venita Lick LPN's note and agree with the documentation.  I personally  performed a face-to-face visit, made revisions and my assessment and plan is as follows.    ASSESSMENT & PLAN:   Microcytic anemia 1.  Microcytic anemia: - Recent hemoglobin on 08/18/2018 was 6.9 with MCV of 74.2.  She received 1 unit PRBC.  Repeat hemoglobin on 08/19/2018 improved to 9.5. - Iron panel on 08/16/2018 shows percent saturation of 2 and ferritin of 17. - Bone marrow biopsy on 04/22/2014 shows hypercellular marrow for age with trilineage hematopoiesis.  CT scan on 03/26/2017 shows spleen within normal limits. - Colonoscopy on 04/07/2015 shows normal exam. -EGD on 01/31/2016 shows duodenal ulcer, erythematous gastric mucosa and duodenal stenosis. -She also has chronic kidney disease competent contributing to anemia. - She is currently taking iron tablet daily which is causing constipation.  She has to strain with bowel movement which is causing bleeding.  I have told her to discontinue oral iron. -We talked about starting her on Feraheme infusion weekly x2.  We discussed the side effects in detail including anaphylactic reaction. -We will check her CBC, reticulocyte count, LDH, ferritin, iron panel, O31, folic acid today.  We will also check SPEP. - We will arrange her for iron infusions.  We will see her back in 4 weeks with repeat CBC.  2.  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  3.  Hypertension: -She is on Maxide 37.5/25 mg daily. -She is also taking Lasix 10 mg daily for lower extremity edema.  4.  Smoking history: -She reportedly has 84-pack-year smoking history. -CT low-dose chest on 10/08/2017 shows lung RADS 2.  Continue annual screening. -We will address this at next visit.     Total time spent is 25 minutes with more than 50% of the time spent face-to-face discussing lab results, treatment plan and coordination of care.  Orders placed this encounter:  Orders Placed This Encounter  Procedures  . Reticulocytes  . CBC with Differential  . Renal  function panel  . Iron and TIBC  . Ferritin  . Vitamin B12  . Folate  . Protein electrophoresis, serum  . Lactate dehydrogenase      Derek Jack, MD [place] [phone]

## 2018-09-23 NOTE — Assessment & Plan Note (Signed)
1.  Microcytic anemia: - Recent hemoglobin on 08/18/2018 was 6.9 with MCV of 74.2.  She received 1 unit PRBC.  Repeat hemoglobin on 08/19/2018 improved to 9.5. - Iron panel on 08/16/2018 shows percent saturation of 2 and ferritin of 17. - Bone marrow biopsy on 04/22/2014 shows hypercellular marrow for age with trilineage hematopoiesis.  CT scan on 03/26/2017 shows spleen within normal limits. - Colonoscopy on 04/07/2015 shows normal exam. -EGD on 01/31/2016 shows duodenal ulcer, erythematous gastric mucosa and duodenal stenosis. -She also has chronic kidney disease competent contributing to anemia. - She is currently taking iron tablet daily which is causing constipation.  She has to strain with bowel movement which is causing bleeding.  I have told her to discontinue oral iron. -We talked about starting her on Feraheme infusion weekly x2.  We discussed the side effects in detail including anaphylactic reaction. -We will check her CBC, reticulocyte count, LDH, ferritin, iron panel, O17, folic acid today.  We will also check SPEP. - We will arrange her for iron infusions.  We will see her back in 4 weeks with repeat CBC.  2.  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  3.  Hypertension: -She is on Maxide 37.5/25 mg daily. -She is also taking Lasix 10 mg daily for lower extremity edema.  4.  Smoking history: -She reportedly has 84-pack-year smoking history. -CT low-dose chest on 10/08/2017 shows lung RADS 2.  Continue annual screening. -We will address this at next visit.

## 2018-09-25 LAB — PROTEIN ELECTROPHORESIS, SERUM
A/G Ratio: 1.3 (ref 0.7–1.7)
Albumin ELP: 3.4 g/dL (ref 2.9–4.4)
Alpha-1-Globulin: 0.2 g/dL (ref 0.0–0.4)
Alpha-2-Globulin: 0.7 g/dL (ref 0.4–1.0)
Beta Globulin: 0.9 g/dL (ref 0.7–1.3)
Gamma Globulin: 0.9 g/dL (ref 0.4–1.8)
Globulin, Total: 2.7 g/dL (ref 2.2–3.9)
Total Protein ELP: 6.1 g/dL (ref 6.0–8.5)

## 2018-09-30 ENCOUNTER — Inpatient Hospital Stay (HOSPITAL_COMMUNITY): Payer: 59

## 2018-09-30 ENCOUNTER — Other Ambulatory Visit: Payer: Self-pay

## 2018-09-30 ENCOUNTER — Encounter (HOSPITAL_COMMUNITY): Payer: Self-pay

## 2018-09-30 VITALS — BP 126/56 | HR 69 | Temp 97.3°F | Resp 18

## 2018-09-30 DIAGNOSIS — D509 Iron deficiency anemia, unspecified: Secondary | ICD-10-CM | POA: Diagnosis not present

## 2018-09-30 MED ORDER — SODIUM CHLORIDE 0.9 % IV SOLN
Freq: Once | INTRAVENOUS | Status: AC
  Administered 2018-09-30: 14:00:00 via INTRAVENOUS

## 2018-09-30 MED ORDER — SODIUM CHLORIDE 0.9 % IV SOLN
510.0000 mg | Freq: Once | INTRAVENOUS | Status: AC
  Administered 2018-09-30: 510 mg via INTRAVENOUS
  Filled 2018-09-30: qty 510

## 2018-09-30 NOTE — Progress Notes (Signed)
Connie Ponce tolerated Feraheme infusion well without complaints or incident. Peripheral IV checked with positive blood return noted prior to and after infusion. VSS upon discharge. Pt discharged self ambulatory using her walker in satisfactory condition

## 2018-09-30 NOTE — Patient Instructions (Signed)
[facility] at [facility] Discharge Instructions  Received Feraheme infusion today. Follow-up as scheduled. Call clinic for any questions or concerns   Thank you for choosing [facility] at [facility] to provide your oncology and hematology care.  To afford each patient quality time with our provider, please arrive at least 15 minutes before your scheduled appointment time.   If you have a lab appointment with the Cancer Center please come in thru the  Main Entrance and check in at the main information desk  You need to re-schedule your appointment should you arrive 10 or more minutes late.  We strive to give you quality time with our providers, and arriving late affects you and other patients whose appointments are after yours.  Also, if you no show three or more times for appointments you may be dismissed from the clinic at the providers discretion.     Again, thank you for choosing [facility].  Our hope is that these requests will decrease the amount of time that you wait before being seen by our physicians.       _____________________________________________________________  Should you have questions after your visit to [facility], please contact our office at [phone] between the hours of 8:00 a.m. and 4:30 p.m.  Voicemails left after 4:00 p.m. will not be returned until the following business day.  For prescription refill requests, have your pharmacy contact our office and allow 72 hours.    Cancer Center Support Programs:   > Cancer Support Group  2nd Tuesday of the month 1pm-2pm, Journey Room   

## 2018-10-07 ENCOUNTER — Encounter (HOSPITAL_COMMUNITY): Payer: Self-pay

## 2018-10-07 ENCOUNTER — Inpatient Hospital Stay (HOSPITAL_COMMUNITY): Payer: 59

## 2018-10-07 ENCOUNTER — Other Ambulatory Visit: Payer: Self-pay

## 2018-10-07 VITALS — BP 122/55 | HR 68 | Temp 97.6°F | Resp 18

## 2018-10-07 DIAGNOSIS — D509 Iron deficiency anemia, unspecified: Secondary | ICD-10-CM | POA: Diagnosis not present

## 2018-10-07 MED ORDER — SODIUM CHLORIDE 0.9% FLUSH
10.0000 mL | Freq: Once | INTRAVENOUS | Status: AC | PRN
  Administered 2018-10-07: 10 mL

## 2018-10-07 MED ORDER — SODIUM CHLORIDE 0.9 % IV SOLN
510.0000 mg | Freq: Once | INTRAVENOUS | Status: AC
  Administered 2018-10-07: 510 mg via INTRAVENOUS
  Filled 2018-10-07: qty 510

## 2018-10-07 MED ORDER — CEPHALEXIN 500 MG PO CAPS: 500.0000 mg | ORAL_CAPSULE | Freq: Two times a day (BID) | ORAL | 0 refills | Status: DC

## 2018-10-07 MED ORDER — SODIUM CHLORIDE 0.9 % IV SOLN
Freq: Once | INTRAVENOUS | Status: AC
  Administered 2018-10-07: 15:00:00 via INTRAVENOUS

## 2018-10-07 NOTE — Patient Instructions (Signed)

## 2018-10-07 NOTE — Progress Notes (Signed)
Patient for feraheme.  Patient stated "boils" appeared on her right arm the day after her last feraheme treatment.  Boils x 2 on right arm noted.  Red with yellow colored drainage noted on band aids.  Reviewed with Reynolds Bowl, NP, and assessed by her with ok to treat with feraheme today.  Instructed the patient an antibiotic would be sent in to her pharmacy.  Patient instructed to use warm compresses three times a day by the nurse practitioner.  Patient verbalized understanding.  No s/s of distress noted.    Patient tolerated iron infusion with no complaints voiced.  Peripheral IV site with good blood return noted before and after infusion.  No bruising or swelling noted at site and patient denied pain.  Band aid applied.  VSS with discharge and left by wheelchair with no s/s of distress noted.

## 2018-10-27 ENCOUNTER — Other Ambulatory Visit (HOSPITAL_COMMUNITY): Payer: Self-pay | Admitting: Emergency Medicine

## 2018-10-27 DIAGNOSIS — D696 Thrombocytopenia, unspecified: Secondary | ICD-10-CM

## 2018-10-28 ENCOUNTER — Inpatient Hospital Stay (HOSPITAL_COMMUNITY): Payer: 59

## 2018-10-28 ENCOUNTER — Encounter (HOSPITAL_COMMUNITY): Payer: Self-pay | Admitting: Hematology

## 2018-10-28 ENCOUNTER — Other Ambulatory Visit: Payer: Self-pay

## 2018-10-28 ENCOUNTER — Inpatient Hospital Stay (HOSPITAL_COMMUNITY): Payer: 59 | Attending: Hematology | Admitting: Hematology

## 2018-10-28 VITALS — BP 150/67 | HR 74 | Temp 97.5°F | Resp 18 | Wt 180.1 lb

## 2018-10-28 DIAGNOSIS — E785 Hyperlipidemia, unspecified: Secondary | ICD-10-CM | POA: Insufficient documentation

## 2018-10-28 DIAGNOSIS — D509 Iron deficiency anemia, unspecified: Secondary | ICD-10-CM

## 2018-10-28 DIAGNOSIS — Z7951 Long term (current) use of inhaled steroids: Secondary | ICD-10-CM | POA: Diagnosis not present

## 2018-10-28 DIAGNOSIS — F419 Anxiety disorder, unspecified: Secondary | ICD-10-CM | POA: Diagnosis not present

## 2018-10-28 DIAGNOSIS — D696 Thrombocytopenia, unspecified: Secondary | ICD-10-CM

## 2018-10-28 DIAGNOSIS — Z8673 Personal history of transient ischemic attack (TIA), and cerebral infarction without residual deficits: Secondary | ICD-10-CM | POA: Insufficient documentation

## 2018-10-28 DIAGNOSIS — F17210 Nicotine dependence, cigarettes, uncomplicated: Secondary | ICD-10-CM | POA: Insufficient documentation

## 2018-10-28 DIAGNOSIS — Z90710 Acquired absence of both cervix and uterus: Secondary | ICD-10-CM | POA: Insufficient documentation

## 2018-10-28 DIAGNOSIS — J449 Chronic obstructive pulmonary disease, unspecified: Secondary | ICD-10-CM | POA: Diagnosis not present

## 2018-10-28 DIAGNOSIS — E039 Hypothyroidism, unspecified: Secondary | ICD-10-CM | POA: Insufficient documentation

## 2018-10-28 DIAGNOSIS — Z7901 Long term (current) use of anticoagulants: Secondary | ICD-10-CM | POA: Diagnosis not present

## 2018-10-28 DIAGNOSIS — I10 Essential (primary) hypertension: Secondary | ICD-10-CM | POA: Insufficient documentation

## 2018-10-28 DIAGNOSIS — Z79899 Other long term (current) drug therapy: Secondary | ICD-10-CM | POA: Diagnosis not present

## 2018-10-28 DIAGNOSIS — Z7982 Long term (current) use of aspirin: Secondary | ICD-10-CM | POA: Insufficient documentation

## 2018-10-28 LAB — CBC WITH DIFFERENTIAL/PLATELET
Abs Immature Granulocytes: 0.09 10*3/uL — ABNORMAL HIGH (ref 0.00–0.07)
Basophils Absolute: 0.1 10*3/uL (ref 0.0–0.1)
Basophils Relative: 1 %
Eosinophils Absolute: 0.3 10*3/uL (ref 0.0–0.5)
Eosinophils Relative: 4 %
HCT: 37.8 % (ref 36.0–46.0)
Hemoglobin: 12.4 g/dL (ref 12.0–15.0)
Immature Granulocytes: 1 %
Lymphocytes Relative: 24 %
Lymphs Abs: 1.7 10*3/uL (ref 0.7–4.0)
MCH: 28.8 pg (ref 26.0–34.0)
MCHC: 32.8 g/dL (ref 30.0–36.0)
MCV: 87.7 fL (ref 80.0–100.0)
Monocytes Absolute: 1 10*3/uL (ref 0.1–1.0)
Monocytes Relative: 14 %
Neutro Abs: 4 10*3/uL (ref 1.7–7.7)
Neutrophils Relative %: 56 %
Platelets: 241 10*3/uL (ref 150–400)
RBC: 4.31 MIL/uL (ref 3.87–5.11)
RDW: 17.8 % — ABNORMAL HIGH (ref 11.5–15.5)
WBC: 7 10*3/uL (ref 4.0–10.5)
nRBC: 0 % (ref 0.0–0.2)

## 2018-10-28 LAB — RENAL FUNCTION PANEL
Albumin: 3.6 g/dL (ref 3.5–5.0)
Anion gap: 8 (ref 5–15)
BUN: 14 mg/dL (ref 8–23)
CO2: 32 mmol/L (ref 22–32)
Calcium: 10.1 mg/dL (ref 8.9–10.3)
Chloride: 91 mmol/L — ABNORMAL LOW (ref 98–111)
Creatinine, Ser: 0.94 mg/dL (ref 0.44–1.00)
GFR calc Af Amer: >60 mL/min (ref >60)
GFR calc non Af Amer: 59 mL/min — ABNORMAL LOW (ref >60)
Glucose, Bld: 118 mg/dL — ABNORMAL HIGH (ref 70–99)
Phosphorus: 3 mg/dL (ref 2.5–4.6)
Potassium: 3.7 mmol/L (ref 3.5–5.1)
Sodium: 131 mmol/L — ABNORMAL LOW (ref 135–145)

## 2018-10-28 LAB — RETICULOCYTES
Immature Retic Fract: 13.9 % (ref 2.3–15.9)
RBC.: 4.31 MIL/uL (ref 3.87–5.11)
Retic Count, Absolute: 112.9 10*3/uL (ref 19.0–186.0)
Retic Ct Pct: 2.6 % (ref 0.4–3.1)

## 2018-10-28 LAB — IRON AND TIBC
Iron: 73 ug/dL (ref 28–170)
Saturation Ratios: 23 % (ref 10.4–31.8)
TIBC: 319 ug/dL (ref 250–450)
UIBC: 246 ug/dL

## 2018-10-28 LAB — FERRITIN: Ferritin: 322 ng/mL — ABNORMAL HIGH (ref 11–307)

## 2018-10-28 LAB — FOLATE: Folate: 9.3 ng/mL (ref >5.9)

## 2018-10-28 LAB — VITAMIN B12: Vitamin B-12: 292 pg/mL (ref 180–914)

## 2018-10-28 LAB — LACTATE DEHYDROGENASE: LDH: 150 U/L (ref 98–192)

## 2018-10-28 NOTE — Patient Instructions (Signed)
[facility] at [facility] Discharge Instructions  You were seen today by Dr. Katragadda. He went over your recent lab results. He will see you back in 3 months for labs and follow up.   Thank you for choosing [facility] at [facility] to provide your oncology and hematology care.  To afford each patient quality time with our provider, please arrive at least 15 minutes before your scheduled appointment time.   If you have a lab appointment with the Cancer Center please come in thru the  Main Entrance and check in at the main information desk  You need to re-schedule your appointment should you arrive 10 or more minutes late.  We strive to give you quality time with our providers, and arriving late affects you and other patients whose appointments are after yours.  Also, if you no show three or more times for appointments you may be dismissed from the clinic at the providers discretion.     Again, thank you for choosing [facility].  Our hope is that these requests will decrease the amount of time that you wait before being seen by our physicians.       _____________________________________________________________  Should you have questions after your visit to [facility], please contact our office at [phone] between the hours of 8:00 a.m. and 4:30 p.m.  Voicemails left after 4:00 p.m. will not be returned until the following business day.  For prescription refill requests, have your pharmacy contact our office and allow 72 hours.    Cancer Center Support Programs:   > Cancer Support Group  2nd Tuesday of the month 1pm-2pm, Journey Room    

## 2018-10-28 NOTE — Progress Notes (Signed)
[place], [place] [address]   CLINIC:  Medical Oncology/Hematology  PCP:  [name], NP [address] [place] [address] [phone]   REASON FOR VISIT:  Follow-up for microcytic anemia:     INTERVAL HISTORY:  Connie Ponce 73 y.o. female returns for follow-up of anemia.  She had received Feraheme infusions in July.  She has tolerated them very well.  Denied any allergy reactions.  Her energy levels have improved after iron therapy.  Denies any bleeding per rectum or melena.  Leg swellings have been stable.  Appetite levels are low.  No fevers or chills reported.    REVIEW OF SYSTEMS:  Review of Systems  Constitutional: Positive for fatigue.  Cardiovascular: Positive for leg swelling.  Neurological: Positive for headaches.  All other systems reviewed and are negative.    PAST MEDICAL/SURGICAL HISTORY:  Past Medical History:  Diagnosis Date  . Anxiety   . Carpal tunnel syndrome   . COPD (chronic obstructive pulmonary disease) ([place])   . Coronary artery disease   . Gastric ulcer   . GERD (gastroesophageal reflux disease)   . Hyperglycemia   . Hyperlipidemia   . Hypertension   . Hypothyroidism   . Osteoarthritis   . Peptic ulcer disease   . Pneumonia 2010  . Renal insufficiency   . 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 Stroke ([place]) 1990s   mild  . TIA (transient ischemic attack)    Past Surgical History:  Procedure Laterality Date  . ABDOMINAL HYSTERECTOMY    . APPENDECTOMY    . BACK SURGERY    . BONE MARROW ASPIRATION Left 04/22/14  . BONE MARROW BIOPSY Left 04/22/14  . CHOLECYSTECTOMY    . COLONOSCOPY     ?2005, [place]  . COLONOSCOPY N/A 04/07/2015   XOV:ANVBTY  . COLONOSCOPY WITH ESOPHAGOGASTRODUODENOSCOPY (EGD)  02/11/2012   RMR: Ulcerative/erosive reflux esophagitis, benign appearing gastric ulcer with unremarkable biopsy, no H pylori. Colonoscopy was unremarkable. Next colonoscopy in 2023  .  CYSTECTOMY     cyst removed from rectal area.   . ESOPHAGOGASTRODUODENOSCOPY  01/05/2003   RMR: Normal esophagus, small hiatal hernia/ A couple of tiny antral erosions, otherwise normal stomach normal D1 and D2.   . ESOPHAGOGASTRODUODENOSCOPY N/A 08/11/2014   OMA:YOKHTXH ulcer/HH  . ESOPHAGOGASTRODUODENOSCOPY N/A 12/01/2014   FSF:[id number] improved gastric ulcerations/p bx  . ESOPHAGOGASTRODUODENOSCOPY N/A 01/31/2016   Procedure: ESOPHAGOGASTRODUODENOSCOPY (EGD);  Surgeon: Daneil Dolin, MD;  [facility];  Service: Endoscopy;  Laterality: N/A;  745  . HEMORRHOID SURGERY    . JOINT REPLACEMENT Right    knee  . MALONEY DILATION N/A 01/31/2016   Procedure: Venia Minks DILATION;  Surgeon: Daneil Dolin, MD;  Location: [facility];  Service: Endoscopy;  Laterality: N/A;     SOCIAL HISTORY:  Social History   Socioeconomic History  . Marital status: Married    Spouse name: Not on file  . Number of children: 3  . Years of education: Not on file  . Highest education level: Not on file  Occupational History  . Not on file  Social Needs  . Financial resource strain: Not hard at all  . Food insecurity    Worry: Never true    Inability: Never true  . Transportation needs    Medical: No    Non-medical: No  Tobacco Use  . Smoking status: Current Every Day Smoker    Packs/day: 1.50  Years: 58.00    Pack years: 87.00    Types: Cigarettes  . Smokeless tobacco: Never Used  Substance and Sexual Activity  . Alcohol use: No    Alcohol/week: 0.0 standard drinks  . Drug use: No  . Sexual activity: Not Currently  Lifestyle  . Physical activity    Days per week: 1 day    Minutes per session: 10 min  . Stress: Only a little  Relationships  . Social connections    Talks on phone: More than three times a week    Gets together: Twice a week    Attends religious service: More than 4 times per year    Active member of club or organization: No    Attends meetings of clubs  or organizations: Never    Relationship status: Married  . Intimate partner violence    Fear of current or ex partner: No    Emotionally abused: No    Physically abused: No    Forced sexual activity: No  Other Topics Concern  . Not on file  Social History Narrative  . Not on file    FAMILY HISTORY:  Family History  Problem Relation Age of Onset  . Other Mother        died age 73, natural causes  . Stroke Mother   . Diabetes Daughter   . Healthy Son   . Healthy Daughter   . Healthy Daughter   . Colon cancer Neg Hx   . Liver disease Neg Hx     CURRENT MEDICATIONS:  Outpatient Encounter Medications as of 10/28/2018  Medication Sig  . albuterol (PROVENTIL HFA;VENTOLIN HFA) 108 (90 Base) MCG/ACT inhaler Inhale 2 puffs into the lungs every 4 (four) hours as needed for wheezing or shortness of breath.  . allopurinol (ZYLOPRIM) 100 MG tablet Take 100 mg by mouth as needed.   Marland Kitchen aspirin EC 81 MG EC tablet Take 2 tablets (162 mg total) by mouth daily.  . budesonide-formoterol (SYMBICORT) 160-4.5 MCG/ACT inhaler Inhale 2 puffs into the lungs 2 (two) times daily as needed (shortness of breath).   . Cholecalciferol (VITAMIN D-3) 5000 UNITS TABS Take 1 tablet by mouth daily.  Marland Kitchen ELIQUIS 5 MG TABS tablet Take 5 mg by mouth 2 (two) times daily.   Marland Kitchen gabapentin (NEURONTIN) 300 MG capsule 300 mg 3 (three) times daily.   Marland Kitchen GNP BACITRACIN ZINC ointment   . montelukast (SINGULAIR) 10 MG tablet   . oxybutynin (DITROPAN) 5 MG tablet TAKE (1) TABLET BY MOUTH THREE TIMES DAILY  . oxyCODONE-acetaminophen (PERCOCET/ROXICET) 5-325 MG tablet Take 1 tablet by mouth every 6 (six) hours as needed for severe pain.  . potassium chloride SA (K-DUR,KLOR-CON) 20 MEQ tablet Take 1 tablet (20 mEq total) by mouth every other day.  . traZODone (DESYREL) 150 MG tablet   . triamterene-hydrochlorothiazide (MAXZIDE-25) 37.5-25 MG tablet   . carvedilol (COREG) 3.125 MG tablet Take 1 tablet (3.125 mg total) by mouth 2  (two) times daily with a meal for 30 days.  . cephALEXin (KEFLEX) 500 MG capsule Take 1 capsule (500 mg total) by mouth 2 (two) times daily. (Patient not taking: Reported on 10/28/2018)  . ferrous sulfate 325 (65 FE) MG tablet Take 1 tablet (325 mg total) by mouth daily with breakfast for 30 days.  . furosemide (LASIX) 20 MG tablet Take 0.5 tablets (10 mg total) by mouth daily for 30 days.  Marland Kitchen levothyroxine (SYNTHROID, LEVOTHROID) 150 MCG tablet Take 1 tablet (150 mcg total) by  mouth daily before breakfast for 30 days.   No facility-administered encounter medications on file as of 10/28/2018.     ALLERGIES:  Allergies  Allergen Reactions  . Tetracyclines & Related   . Shellfish-Derived Products Nausea And Vomiting     PHYSICAL EXAM:  ECOG Performance status: 1  Vitals:   10/28/18 1435  BP: (!) 150/67  Pulse: 74  Resp: 18  Temp: (!) 97.5 F (36.4 C)  SpO2: 95%   Filed Weights   10/28/18 1435  Weight: 180 lb 1.6 oz (81.7 kg)    Physical Exam Vitals signs reviewed.  Constitutional:      Appearance: Normal appearance.  Cardiovascular:     Rate and Rhythm: Normal rate and regular rhythm.     Heart sounds: Normal heart sounds.  Pulmonary:     Effort: Pulmonary effort is normal.     Breath sounds: Normal breath sounds.  Abdominal:     General: There is no distension.     Palpations: Abdomen is soft. There is no mass.  Musculoskeletal:     Right lower leg: Edema present.     Left lower leg: Edema present.  Skin:    General: Skin is warm.  Neurological:     General: No focal deficit present.     Mental Status: She is alert and oriented to person, place, and time.  Psychiatric:        Mood and Affect: Mood normal.        Behavior: Behavior normal.      LABORATORY DATA:  I have reviewed the labs as listed.  CBC    Component Value Date/Time   WBC 7.0 10/28/2018 1404   RBC 4.31 10/28/2018 1404   RBC 4.31 10/28/2018 1404   HGB 12.4 10/28/2018 1404   HCT 37.8  10/28/2018 1404   PLT 241 10/28/2018 1404   MCV 87.7 10/28/2018 1404   MCH 28.8 10/28/2018 1404   MCHC 32.8 10/28/2018 1404   RDW 17.8 (H) 10/28/2018 1404   LYMPHSABS 1.7 10/28/2018 1404   MONOABS 1.0 10/28/2018 1404   EOSABS 0.3 10/28/2018 1404   BASOSABS 0.1 10/28/2018 1404   CMP Latest Ref Rng & Units 10/28/2018 09/23/2018 08/18/2018  Glucose 70 - 99 mg/dL 118(H) 94 108(H)  BUN 8 - 23 mg/dL 14 19 10   Creatinine 0.44 - 1.00 mg/dL 0.94 0.95 0.78  Sodium 135 - 145 mmol/L 131(L) 137 139  Potassium 3.5 - 5.1 mmol/L 3.7 4.1 3.8  Chloride 98 - 111 mmol/L 91(L) 101 107  CO2 22 - 32 mmol/L 32 29 23  Calcium 8.9 - 10.3 mg/dL 10.1 10.0 8.9  Total Protein 6.5 - 8.1 g/dL - - -  Total Bilirubin 0.3 - 1.2 mg/dL - - -  Alkaline Phos 38 - 126 U/L - - -  AST 15 - 41 U/L - - -  ALT 0 - 44 U/L - - -       DIAGNOSTIC IMAGING:  I have independently reviewed the scans and discussed with the patient.   I have reviewed Venita Lick LPN's note and agree with the documentation.  I personally performed a face-to-face visit, made revisions and my assessment and plan is as follows.    ASSESSMENT & PLAN:   Microcytic anemia 1.  Iron deficiency anemia: - Hemoglobin on 08/18/2018 was 6.9 with MCV of 74.2.  Received 1 unit PRBC. -Iron panel on 08/16/2018 showed ferritin of 79% saturation of 2. -Bone marrow biopsy on 04/22/2014 showed hypercellular bone marrow with  trilineage hematopoiesis.  CT scan on 03/26/2017 showed spleen within normal limits. -Colonoscopy was on 04/07/2015, normal exam. -EGD on 01/31/2016 shows duodenal ulcer, erythematous gastric mucosa and duodenal stenosis. - She took iron pill which caused severe constipation.  Hence this was discontinued. -She received Feraheme on 09/30/2018 and 10/07/2018. -She felt improvement in energy levels.  We discussed blood work from today which showed hemoglobin improved to 12.4.  Ferritin and iron panel are pending. -She does not require any iron at this  time.  We will see her back in 3 months with repeat labs.  2.  DVT: -She is on Eliquis 5 mg twice daily.  3.  Smoking history: - She reportedly smoked 84-pack-years. -CT low-dose scan on 10/08/2017 shows lung RADS 2.  She will continue annual screening.   Orders placed this encounter:  Orders Placed This Encounter  Procedures  . Iron and TIBC  . Ferritin  . Vitamin B12  . Folate  . CBC with Differential/Platelet  . Comprehensive metabolic panel      Derek Jack, MD [place] [phone]

## 2018-10-28 NOTE — Assessment & Plan Note (Signed)
1.  Iron deficiency anemia: - Hemoglobin on 08/18/2018 was 6.9 with MCV of 74.2.  Received 1 unit PRBC. -Iron panel on 08/16/2018 showed ferritin of 79% saturation of 2. -Bone marrow biopsy on 04/22/2014 showed hypercellular bone marrow with trilineage hematopoiesis.  CT scan on 03/26/2017 showed spleen within normal limits. -Colonoscopy was on 04/07/2015, normal exam. -EGD on 01/31/2016 shows duodenal ulcer, erythematous gastric mucosa and duodenal stenosis. - She took iron pill which caused severe constipation.  Hence this was discontinued. -She received Feraheme on 09/30/2018 and 10/07/2018. -She felt improvement in energy levels.  We discussed blood work from today which showed hemoglobin improved to 12.4.  Ferritin and iron panel are pending. -She does not require any iron at this time.  We will see her back in 3 months with repeat labs.  2.  DVT: -She is on Eliquis 5 mg twice daily.  3.  Smoking history: - She reportedly smoked 84-pack-years. -CT low-dose scan on 10/08/2017 shows lung RADS 2.  She will continue annual screening.

## 2018-10-29 LAB — PROTEIN ELECTROPHORESIS, SERUM
A/G Ratio: 1.2 (ref 0.7–1.7)
Albumin ELP: 3.3 g/dL (ref 2.9–4.4)
Alpha-1-Globulin: 0.2 g/dL (ref 0.0–0.4)
Alpha-2-Globulin: 0.7 g/dL (ref 0.4–1.0)
Beta Globulin: 1.1 g/dL (ref 0.7–1.3)
Gamma Globulin: 0.8 g/dL (ref 0.4–1.8)
Globulin, Total: 2.7 g/dL (ref 2.2–3.9)
Total Protein ELP: 6 g/dL (ref 6.0–8.5)

## 2018-12-08 ENCOUNTER — Emergency Department: Payer: 59

## 2018-12-08 ENCOUNTER — Emergency Department
Admission: EM | Admit: 2018-12-08 | Discharge: 2018-12-08 | Disposition: A | Discharge status: Home / Self Care | Payer: 59 | Attending: Student | Admitting: Student

## 2018-12-08 ENCOUNTER — Other Ambulatory Visit: Payer: Self-pay

## 2018-12-08 ENCOUNTER — Encounter: Payer: Self-pay | Admitting: Emergency Medicine

## 2018-12-08 DIAGNOSIS — F17210 Nicotine dependence, cigarettes, uncomplicated: Secondary | ICD-10-CM | POA: Diagnosis not present

## 2018-12-08 DIAGNOSIS — E039 Hypothyroidism, unspecified: Secondary | ICD-10-CM | POA: Insufficient documentation

## 2018-12-08 DIAGNOSIS — Z7982 Long term (current) use of aspirin: Secondary | ICD-10-CM | POA: Diagnosis not present

## 2018-12-08 DIAGNOSIS — Z96651 Presence of right artificial knee joint: Secondary | ICD-10-CM | POA: Insufficient documentation

## 2018-12-08 DIAGNOSIS — J449 Chronic obstructive pulmonary disease, unspecified: Secondary | ICD-10-CM | POA: Insufficient documentation

## 2018-12-08 DIAGNOSIS — N183 Chronic kidney disease, stage 3 (moderate): Secondary | ICD-10-CM | POA: Diagnosis not present

## 2018-12-08 DIAGNOSIS — I129 Hypertensive chronic kidney disease with stage 1 through stage 4 chronic kidney disease, or unspecified chronic kidney disease: Secondary | ICD-10-CM | POA: Diagnosis not present

## 2018-12-08 DIAGNOSIS — Z8673 Personal history of transient ischemic attack (TIA), and cerebral infarction without residual deficits: Secondary | ICD-10-CM | POA: Diagnosis not present

## 2018-12-08 DIAGNOSIS — Z79899 Other long term (current) drug therapy: Secondary | ICD-10-CM | POA: Insufficient documentation

## 2018-12-08 DIAGNOSIS — L729 Follicular cyst of the skin and subcutaneous tissue, unspecified: Secondary | ICD-10-CM | POA: Insufficient documentation

## 2018-12-08 DIAGNOSIS — R220 Localized swelling, mass and lump, head: Secondary | ICD-10-CM | POA: Diagnosis present

## 2018-12-08 MED ORDER — METHYLPREDNISOLONE 4 MG PO TBPK: ORAL_TABLET | ORAL | 0 refills | Status: DC

## 2018-12-08 NOTE — ED Triage Notes (Signed)
Says she has a knot on back of head.

## 2018-12-08 NOTE — Discharge Instructions (Addendum)
Follow-up with your regular doctor or dermatologist if not better in 3 to 4 days.  Return emergency department if needed.

## 2018-12-08 NOTE — ED Provider Notes (Signed)
[facility] Emergency Department Provider Note  ____________________________________________   First MD Initiated Contact with Patient 12/08/18 1208     (approximate)  I have reviewed the triage vital signs and the nursing notes.   HISTORY  Chief Complaint Other    HPI Connie Ponce is a 73 y.o. female presents emergency department complaining of multiple swollen lumps on the back of her scalp.  She has been seen a dermatologist in [place] for rash on her arms and these lumps.  She states they will come and go.  She is concerned because now they are affecting her vision.  She states I think I might have a brain tumor.  She denies any strokelike symptoms.  No chest pain or shortness of breath.    Past Medical History:  Diagnosis Date  . Anxiety   . Carpal tunnel syndrome   . COPD (chronic obstructive pulmonary disease) ([place])   . Coronary artery disease   . Gastric ulcer   . GERD (gastroesophageal reflux disease)   . Hyperglycemia   . Hyperlipidemia   . Hypertension   . Hypothyroidism   . Osteoarthritis   . Peptic ulcer disease   . Pneumonia 2010  . Renal insufficiency   . 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 Stroke ([place]) 1990s   mild  . TIA (transient ischemic attack)     Patient Active Problem List   Diagnosis Date Noted  . Microcytic anemia 09/23/2018  . Spinal stenosis of lumbar region 08/17/2018  . AKI (acute kidney injury) ([place])   . Syncope and collapse 03/28/2018  . COPD (chronic obstructive pulmonary disease) ([place]) 03/28/2018  . Tobacco abuse 03/28/2018  . Fall at home 03/28/2018  . CKD (chronic kidney disease) stage 3, GFR 30-59 ml/min ([facility]) 03/28/2018  . Anemia in chronic kidney disease (CKD) 03/28/2018  . Hyperlipidemia 03/28/2018  . Acute metabolic encephalopathy 65/46/5035  . Gait instability 10/03/2017  . Dysarthria 10/03/2017  . Essential hypertension 10/03/2017  . Slurred speech   . Abdominal mass, RUQ (right upper  quadrant) 03/13/2017  . Abdominal pain 03/13/2017  . RUQ pain 03/13/2017  . Dysphagia   . TIA (transient ischemic attack) 01/03/2016  . Absolute anemia 07/03/2015  . Proctalgia   . Rectal pain 01/04/2015  . Hemorrhoids 01/04/2015  . Diarrhea 01/04/2015  . Gastric ulceration   . Gastric ulcer 11/29/2014  . Toxic metabolic encephalopathy 46/56/8127  . UTI (urinary tract infection) 09/19/2014  . ARF (acute renal failure) ([place])   . LUQ pain 07/28/2014  . Nausea with vomiting 07/28/2014  . Bilateral lower extremity edema 07/28/2014  . Thrombocytopenia ([place]) 04/12/2014  . RLQ abdominal pain 01/22/2012  . Constipation 01/22/2012  . GERD (gastroesophageal reflux disease) 01/22/2012    Past Surgical History:  Procedure Laterality Date  . ABDOMINAL HYSTERECTOMY    . APPENDECTOMY    . BACK SURGERY    . BONE MARROW ASPIRATION Left 04/22/14  . BONE MARROW BIOPSY Left 04/22/14  . CHOLECYSTECTOMY    . COLONOSCOPY     ?2005, [place]  . COLONOSCOPY N/A 04/07/2015   NTZ:GYFVCB  . COLONOSCOPY WITH ESOPHAGOGASTRODUODENOSCOPY (EGD)  02/11/2012   RMR: Ulcerative/erosive reflux esophagitis, benign appearing gastric ulcer with unremarkable biopsy, no H pylori. Colonoscopy was unremarkable. Next colonoscopy in 2023  . CYSTECTOMY     cyst removed from rectal area.   . ESOPHAGOGASTRODUODENOSCOPY  01/05/2003   RMR: Normal esophagus, small hiatal hernia/ A couple of tiny antral erosions, otherwise normal stomach normal  D1 and D2.   . ESOPHAGOGASTRODUODENOSCOPY N/A 08/11/2014   YKZ:LDJTTSV ulcer/HH  . ESOPHAGOGASTRODUODENOSCOPY N/A 12/01/2014   XBL:[id number] improved gastric ulcerations/p bx  . ESOPHAGOGASTRODUODENOSCOPY N/A 01/31/2016   Procedure: ESOPHAGOGASTRODUODENOSCOPY (EGD);  Surgeon: Daneil Dolin, MD;  [facility];  Service: Endoscopy;  Laterality: N/A;  745  . HEMORRHOID SURGERY    . JOINT REPLACEMENT Right    knee  . MALONEY DILATION N/A 01/31/2016   Procedure:  Venia Minks DILATION;  Surgeon: Daneil Dolin, MD;  [facility];  Service: Endoscopy;  Laterality: N/A;    Prior to Admission medications   Medication Sig Start Date End Date Taking? Authorizing Provider  albuterol (PROVENTIL HFA;VENTOLIN HFA) 108 (90 Base) MCG/ACT inhaler Inhale 2 puffs into the lungs every 4 (four) hours as needed for wheezing or shortness of breath. 08/10/17   Noemi Chapel, MD  allopurinol (ZYLOPRIM) 100 MG tablet Take 100 mg by mouth as needed.  01/20/18   [provider]  aspirin EC 81 MG EC tablet Take 2 tablets (162 mg total) by mouth daily. 01/05/16   Thurnell Lose, MD  budesonide-formoterol (SYMBICORT) 160-4.5 MCG/ACT inhaler Inhale 2 puffs into the lungs 2 (two) times daily as needed (shortness of breath).     [provider]  carvedilol (COREG) 3.125 MG tablet Take 1 tablet (3.125 mg total) by mouth 2 (two) times daily with a meal for 30 days. 08/19/18 09/23/18  Manuella Ghazi, Pratik D, DO  Cholecalciferol (VITAMIN D-3) 5000 UNITS TABS Take 1 tablet by mouth daily.    [provider]  ELIQUIS 5 MG TABS tablet Take 5 mg by mouth 2 (two) times daily.  01/29/18   [provider]  ferrous sulfate 325 (65 FE) MG tablet Take 1 tablet (325 mg total) by mouth daily with breakfast for 30 days. 08/20/18 09/23/18  Manuella Ghazi, Pratik D, DO  furosemide (LASIX) 20 MG tablet Take 0.5 tablets (10 mg total) by mouth daily for 30 days. 08/20/18 09/23/18  Manuella Ghazi, Pratik D, DO  gabapentin (NEURONTIN) 300 MG capsule 300 mg 3 (three) times daily.  07/08/17   [provider]  [place] ointment  09/14/18   [provider]  levothyroxine (SYNTHROID, LEVOTHROID) 150 MCG tablet Take 1 tablet (150 mcg total) by mouth daily before breakfast for 30 days. 03/31/18 09/23/18  Murlean Iba, MD  methylPREDNISolone (MEDROL DOSEPAK) 4 MG TBPK tablet Take 6 pills on day one then decrease by 1 pill each day 12/08/18   Versie Starks, PA-C  montelukast  (SINGULAIR) 10 MG tablet  09/08/18   [provider]  oxybutynin (DITROPAN) 5 MG tablet TAKE (1) TABLET BY MOUTH THREE TIMES DAILY 05/05/18   Florian Buff, MD  potassium chloride SA (K-DUR,KLOR-CON) 20 MEQ tablet Take 1 tablet (20 mEq total) by mouth every other day. 04/03/18   Murlean Iba, MD  traZODone (DESYREL) 150 MG tablet  10/01/17   [provider]  triamterene-hydrochlorothiazide [place]add Burner) 37.5-25 MG tablet  09/08/18   [provider]    Allergies Tetracyclines & related and Shellfish-derived products  Family History  Problem Relation Age of Onset  . Other Mother        died age 34, natural causes  . Stroke Mother   . Diabetes Daughter   . Healthy Son   . Healthy Daughter   . Healthy Daughter   . Colon cancer Neg Hx   . Liver disease Neg Hx     Social History  Social History   Tobacco Use  . Smoking status: Current Every Day Smoker    Packs/day: 1.00    Years: 58.00    Pack years: 58.00    Types: Cigarettes  . Smokeless tobacco: Never Used  Substance Use Topics  . Alcohol use: No    Alcohol/week: 0.0 standard drinks  . Drug use: No    Review of Systems  Constitutional: No fever/chills Eyes: Positive visual changes. ENT: No sore throat. Respiratory: Denies cough Genitourinary: Negative for dysuria. Musculoskeletal: Negative for back pain. Skin: Positive for rash.    ____________________________________________   PHYSICAL EXAM:  VITAL SIGNS: ED Triage Vitals  Enc Vitals Group     BP 12/08/18 1130 130/60     Pulse Rate 12/08/18 1130 78     Resp 12/08/18 1130 18     Temp 12/08/18 1130 98.6 F (37 C)     Temp Source 12/08/18 1130 Oral     SpO2 12/08/18 1130 98 %     Weight 12/08/18 1130 215 lb (97.5 kg)     Height 12/08/18 1130 '5\' 4"'$  (1.626 m)     Head Circumference --      Peak Flow --      Pain Score 12/08/18 1136 10     Pain Loc --      Pain Edu? --      Excl. in [place]? --     Constitutional: Alert and  oriented. Well appearing and in no acute distress. Eyes: Conjunctivae are normal.  Head: Atraumatic.  Several hard cystlike lesions are noted on the scalp Nose: No congestion/rhinnorhea. Mouth/Throat: Mucous membranes are moist.   Neck:  supple no lymphadenopathy noted Cardiovascular: Normal rate, regular rhythm. Heart sounds are normal Respiratory: Normal respiratory effort.  No retractions, lungs c t a  GU: deferred Musculoskeletal: FROM all extremities, warm and well perfused Neurologic:  Normal speech and language.  Skin:  Skin is warm, dry and intact. No rash noted. Psychiatric: Mood and affect are normal. Speech and behavior are normal.  ____________________________________________   LABS (all labs ordered are listed, but only abnormal results are displayed)  Labs Reviewed - No data to display ____________________________________________   ____________________________________________  RADIOLOGY  CT of the head is negative  ____________________________________________   PROCEDURES  Procedure(s) performed: No  Procedures    ____________________________________________   INITIAL IMPRESSION / ASSESSMENT AND PLAN / ED COURSE  Pertinent labs & imaging results that were available during my care of the patient were reviewed by me and considered in my medical decision making (see chart for details).   Patient 73 year old female presents emergency department with concerns of several dermal cyst on her scalp.  She is worried that she has a brain tumor that is pushing and causing them to hurt and causing her to have blurred vision.  She is being followed by a dermatologist.  See HPI  Physical exam shows several swollen tender areas on the posterior scalp.  They are very cystlike.  CT of the head is negative  Reassured patient that she does not have a brain tumor.  She was placed on a methylprednisolone taper as she was unable to complete her prednisone taper.  I feel  that this will not cause as many side effects.  She is to try this and follow-up with her dermatologist.  She states she understands will comply.  She is discharged stable condition.    Olena Willy was evaluated in Emergency Department on 12/08/2018 for the symptoms described in  the history of present illness. She was evaluated in the context of the global COVID-19 pandemic, which necessitated consideration that the patient might be at risk for infection with the SARS-CoV-2 virus that causes COVID-19. Institutional protocols and algorithms that pertain to the evaluation of patients at risk for COVID-19 are in a state of rapid change based on information released by regulatory bodies including the CDC and federal and state organizations. These policies and algorithms were followed during the patient's care in the ED.   As part of my medical decision making, I reviewed the following data within the [place] notes reviewed and incorporated, Old chart reviewed, Radiograph reviewed CT of the head is negative, Notes from prior ED visits and [place] Controlled Substance Database  ____________________________________________   FINAL CLINICAL IMPRESSION(S) / ED DIAGNOSES  Final diagnoses:  Scalp cyst      NEW MEDICATIONS STARTED DURING THIS VISIT:  New Prescriptions   METHYLPREDNISOLONE (MEDROL DOSEPAK) 4 MG TBPK TABLET    Take 6 pills on day one then decrease by 1 pill each day     Note:  This document was prepared using Dragon voice recognition software and may include unintentional dictation errors.    Versie Starks, PA-C 12/08/18 1319    Lilia Pro., MD 12/08/18 [phone]

## 2018-12-08 NOTE — ED Notes (Signed)
See triage note.  Presents with 2 swollen areas to back of neck    No drainage

## 2018-12-08 NOTE — ED Triage Notes (Signed)
Pt in via POV from home, reports I have these two "knots" on the back of my head; states they come and go.  One appears as a fatty tissue lump, other is much smaller with a scabbed over area.  No drainage, denies any recent injury.  NAD noted at this time.

## 2018-12-09 ENCOUNTER — Telehealth: Payer: Self-pay

## 2018-12-09 NOTE — Telephone Encounter (Signed)
PA for [employer] 12.5 was submitted through [email or web address]. waiting on an approval or denial.

## 2019-01-28 ENCOUNTER — Other Ambulatory Visit: Payer: Self-pay

## 2019-01-28 ENCOUNTER — Inpatient Hospital Stay (HOSPITAL_COMMUNITY): Payer: 59 | Attending: Hematology

## 2019-01-28 DIAGNOSIS — F419 Anxiety disorder, unspecified: Secondary | ICD-10-CM | POA: Insufficient documentation

## 2019-01-28 DIAGNOSIS — Z79899 Other long term (current) drug therapy: Secondary | ICD-10-CM | POA: Diagnosis not present

## 2019-01-28 DIAGNOSIS — K219 Gastro-esophageal reflux disease without esophagitis: Secondary | ICD-10-CM | POA: Diagnosis not present

## 2019-01-28 DIAGNOSIS — J449 Chronic obstructive pulmonary disease, unspecified: Secondary | ICD-10-CM | POA: Insufficient documentation

## 2019-01-28 DIAGNOSIS — I129 Hypertensive chronic kidney disease with stage 1 through stage 4 chronic kidney disease, or unspecified chronic kidney disease: Secondary | ICD-10-CM | POA: Diagnosis present

## 2019-01-28 DIAGNOSIS — Z7901 Long term (current) use of anticoagulants: Secondary | ICD-10-CM | POA: Insufficient documentation

## 2019-01-28 DIAGNOSIS — F17210 Nicotine dependence, cigarettes, uncomplicated: Secondary | ICD-10-CM | POA: Diagnosis not present

## 2019-01-28 DIAGNOSIS — D509 Iron deficiency anemia, unspecified: Secondary | ICD-10-CM | POA: Diagnosis not present

## 2019-01-28 DIAGNOSIS — E785 Hyperlipidemia, unspecified: Secondary | ICD-10-CM | POA: Insufficient documentation

## 2019-01-28 DIAGNOSIS — N189 Chronic kidney disease, unspecified: Secondary | ICD-10-CM | POA: Insufficient documentation

## 2019-01-28 DIAGNOSIS — D631 Anemia in chronic kidney disease: Secondary | ICD-10-CM | POA: Diagnosis not present

## 2019-01-28 DIAGNOSIS — E039 Hypothyroidism, unspecified: Secondary | ICD-10-CM | POA: Insufficient documentation

## 2019-01-28 LAB — CBC WITH DIFFERENTIAL/PLATELET
Abs Immature Granulocytes: 0.02 10*3/uL (ref 0.00–0.07)
Basophils Absolute: 0.1 10*3/uL (ref 0.0–0.1)
Basophils Relative: 1 %
Eosinophils Absolute: 0.8 10*3/uL — ABNORMAL HIGH (ref 0.0–0.5)
Eosinophils Relative: 13 %
HCT: 36.1 % (ref 36.0–46.0)
Hemoglobin: 11.7 g/dL — ABNORMAL LOW (ref 12.0–15.0)
Immature Granulocytes: 0 %
Lymphocytes Relative: 24 %
Lymphs Abs: 1.4 10*3/uL (ref 0.7–4.0)
MCH: 30.9 pg (ref 26.0–34.0)
MCHC: 32.4 g/dL (ref 30.0–36.0)
MCV: 95.3 fL (ref 80.0–100.0)
Monocytes Absolute: 0.5 10*3/uL (ref 0.1–1.0)
Monocytes Relative: 9 %
Neutro Abs: 3 10*3/uL (ref 1.7–7.7)
Neutrophils Relative %: 53 %
Platelets: 193 10*3/uL (ref 150–400)
RBC: 3.79 MIL/uL — ABNORMAL LOW (ref 3.87–5.11)
RDW: 13.4 % (ref 11.5–15.5)
WBC: 5.7 10*3/uL (ref 4.0–10.5)
nRBC: 0 % (ref 0.0–0.2)

## 2019-01-28 LAB — COMPREHENSIVE METABOLIC PANEL
ALT: 15 U/L (ref 0–44)
AST: 19 U/L (ref 15–41)
Albumin: 3.3 g/dL — ABNORMAL LOW (ref 3.5–5.0)
Alkaline Phosphatase: 70 U/L (ref 38–126)
Anion gap: 8 (ref 5–15)
BUN: 20 mg/dL (ref 8–23)
CO2: 24 mmol/L (ref 22–32)
Calcium: 10 mg/dL (ref 8.9–10.3)
Chloride: 106 mmol/L (ref 98–111)
Creatinine, Ser: 0.92 mg/dL (ref 0.44–1.00)
GFR calc Af Amer: >60 mL/min (ref >60)
GFR calc non Af Amer: >60 mL/min (ref >60)
Glucose, Bld: 93 mg/dL (ref 70–99)
Potassium: 3.9 mmol/L (ref 3.5–5.1)
Sodium: 138 mmol/L (ref 135–145)
Total Bilirubin: 0.5 mg/dL (ref 0.3–1.2)
Total Protein: 6.1 g/dL — ABNORMAL LOW (ref 6.5–8.1)

## 2019-01-28 LAB — FERRITIN: Ferritin: 84 ng/mL (ref 11–307)

## 2019-01-28 LAB — VITAMIN B12: Vitamin B-12: 323 pg/mL (ref 180–914)

## 2019-01-28 LAB — IRON AND TIBC
Iron: 48 ug/dL (ref 28–170)
Saturation Ratios: 16 % (ref 10.4–31.8)
TIBC: 296 ug/dL (ref 250–450)
UIBC: 248 ug/dL

## 2019-01-28 LAB — FOLATE: Folate: 15.8 ng/mL (ref >5.9)

## 2019-02-03 ENCOUNTER — Ambulatory Visit (HOSPITAL_COMMUNITY): Payer: 59 | Admitting: Hematology

## 2019-02-04 ENCOUNTER — Other Ambulatory Visit: Payer: Self-pay

## 2019-02-04 ENCOUNTER — Inpatient Hospital Stay (HOSPITAL_BASED_OUTPATIENT_CLINIC_OR_DEPARTMENT_OTHER): Payer: 59 | Admitting: Hematology

## 2019-02-04 ENCOUNTER — Encounter (HOSPITAL_COMMUNITY): Payer: Self-pay | Admitting: Hematology

## 2019-02-04 DIAGNOSIS — D631 Anemia in chronic kidney disease: Secondary | ICD-10-CM | POA: Diagnosis not present

## 2019-02-04 DIAGNOSIS — N183 Chronic kidney disease, stage 3 unspecified: Secondary | ICD-10-CM | POA: Diagnosis not present

## 2019-02-04 DIAGNOSIS — I129 Hypertensive chronic kidney disease with stage 1 through stage 4 chronic kidney disease, or unspecified chronic kidney disease: Secondary | ICD-10-CM | POA: Diagnosis not present

## 2019-02-04 MED ORDER — MECLIZINE HCL 25 MG PO TABS: 25.0000 mg | ORAL_TABLET | Freq: Three times a day (TID) | ORAL | 5 refills | Status: DC | PRN

## 2019-02-04 NOTE — Patient Instructions (Signed)
[facility]  Discharge Instructions:  You saw Renee Nester, NP, today. _______________________________________________________________  Thank you for choosing [facility] at [facility] to provide your oncology and hematology care.  To afford each patient quality time with our providers, please arrive at least 15 minutes before your scheduled appointment.  You need to re-schedule your appointment if you arrive 10 or more minutes late.  We strive to give you quality time with our providers, and arriving late affects you and other patients whose appointments are after yours.  Also, if you no show three or more times for appointments you may be dismissed from the clinic.  Again, thank you for choosing  [facility]. Our hope is that these requests will allow you access to exceptional care and in a timely manner. _______________________________________________________________  If you have questions after your visit, please contact our office at [phone] between the hours of 8:30 a.m. and 5:00 p.m. Voicemails left after 4:30 p.m. will not be returned until the following business day. _______________________________________________________________  For prescription refill requests, have your pharmacy contact our office. _______________________________________________________________  Recommendations made by the consultant and any test results will be sent to your referring physician. _______________________________________________________________ 

## 2019-02-05 NOTE — Progress Notes (Signed)
Connie Ponce,  [id number]   CLINIC:  Medical Oncology/Hematology  PCP:  [name], NP [address] [place] [address] [phone]   REASON FOR VISIT:  Follow-up for anemia  CURRENT THERAPY: Clinical surveillance    INTERVAL HISTORY:  [name] 73 y.o. female presents today for follow-up.  She reports overall doing well since her last visit.  She denies any significant fatigue.  She denies any obvious signs of bleeding.  No change in bowel habits.  Appetite is stable.  No weight loss.  Her main concern today is a generalized papular rash that she has had for the past year.  The rash is associated with pruritus.  She has been seen by several dermatologists.  She is also had a two biopsies that were inconclusive.  Again, this is her main concern today.   REVIEW OF SYSTEMS:  Review of Systems  Constitutional: Positive for fatigue.  HENT:  Negative.   Eyes: Negative.   Respiratory: Negative.   Cardiovascular: Negative.   Gastrointestinal: Negative.   Endocrine: Negative.   Genitourinary: Negative.    Musculoskeletal: Positive for arthralgias, back pain, gait problem and myalgias.  Skin: Positive for rash.  Neurological: Positive for extremity weakness and gait problem.  Hematological: Negative.   Psychiatric/Behavioral: Negative.      PAST MEDICAL/SURGICAL HISTORY:  Past Medical History:  Diagnosis Date  . Anxiety   . Carpal tunnel syndrome   . COPD (chronic obstructive pulmonary disease) ([place])   . Coronary artery disease   . Gastric ulcer   . GERD (gastroesophageal reflux disease)   . Hyperglycemia   . Hyperlipidemia   . Hypertension   . Hypothyroidism   . Osteoarthritis   . Peptic ulcer disease   . Pneumonia 2010  . Renal insufficiency   . 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 Stroke ([place]) 1990s   mild  . TIA (transient ischemic attack)    Past Surgical History:  Procedure Laterality Date  . ABDOMINAL  HYSTERECTOMY    . APPENDECTOMY    . BACK SURGERY    . BONE MARROW ASPIRATION Left 04/22/14  . BONE MARROW BIOPSY Left 04/22/14  . CHOLECYSTECTOMY    . COLONOSCOPY     ?2005, [place]  . COLONOSCOPY N/A 04/07/2015   [id number]  . COLONOSCOPY WITH ESOPHAGOGASTRODUODENOSCOPY (EGD)  02/11/2012   RMR: Ulcerative/erosive reflux esophagitis, benign appearing gastric ulcer with unremarkable biopsy, no H pylori. Colonoscopy was unremarkable. Next colonoscopy in 2023  . CYSTECTOMY     cyst removed from rectal area.   . ESOPHAGOGASTRODUODENOSCOPY  01/05/2003   RMR: Normal esophagus, small hiatal hernia/ A couple of tiny antral erosions, otherwise normal stomach normal D1 and D2.   . ESOPHAGOGASTRODUODENOSCOPY N/A 08/11/2014   MVH:QIONGEX ulcer/HH  . ESOPHAGOGASTRODUODENOSCOPY N/A 12/01/2014   BMW:[id number] improved gastric ulcerations/p bx  . ESOPHAGOGASTRODUODENOSCOPY N/A 01/31/2016   Procedure: ESOPHAGOGASTRODUODENOSCOPY (EGD);  Surgeon: Daneil Dolin, MD;  Location: [facility];  Service: Endoscopy;  Laterality: N/A;  745  . HEMORRHOID SURGERY    . JOINT REPLACEMENT Right    knee  . MALONEY DILATION N/A 01/31/2016   Procedure: Venia Minks DILATION;  Surgeon: Daneil Dolin, MD;  Location: [facility];  Service: Endoscopy;  Laterality: N/A;     SOCIAL HISTORY:  Social History   Socioeconomic History  . Marital status: Married    Spouse name: Not on file  . Number of children: 3  . Years  of education: Not on file  . Highest education level: Not on file  Occupational History  . Not on file  Social Needs  . Financial resource strain: Not hard at all  . Food insecurity    Worry: Never true    Inability: Never true  . Transportation needs    Medical: No    Non-medical: No  Tobacco Use  . Smoking status: Current Every Day Smoker    Packs/day: 1.00    Years: 58.00    Pack years: 58.00    Types: Cigarettes  . Smokeless tobacco: Never Used  Substance and Sexual Activity   . Alcohol use: No    Alcohol/week: 0.0 standard drinks  . Drug use: No  . Sexual activity: Not Currently  Lifestyle  . Physical activity    Days per week: 1 day    Minutes per session: 10 min  . Stress: Only a little  Relationships  . Social connections    Talks on phone: More than three times a week    Gets together: Twice a week    Attends religious service: More than 4 times per year    Active member of club or organization: No    Attends meetings of clubs or organizations: Never    Relationship status: Married  . Intimate partner violence    Fear of current or ex partner: No    Emotionally abused: No    Physically abused: No    Forced sexual activity: No  Other Topics Concern  . Not on file  Social History Narrative  . Not on file    FAMILY HISTORY:  Family History  Problem Relation Age of Onset  . Other Mother        died age 75, natural causes  . Stroke Mother   . Diabetes Daughter   . Healthy Son   . Healthy Daughter   . Healthy Daughter   . Colon cancer Neg Hx   . Liver disease Neg Hx     CURRENT MEDICATIONS:  Outpatient Encounter Medications as of 02/04/2019  Medication Sig  . colchicine 0.6 MG tablet Take 0.6 mg by mouth as needed.   Marland Kitchen acyclovir (ZOVIRAX) 400 MG tablet Take 400 mg by mouth daily.   Marland Kitchen albuterol (PROVENTIL HFA;VENTOLIN HFA) 108 (90 Base) MCG/ACT inhaler Inhale 2 puffs into the lungs every 4 (four) hours as needed for wheezing or shortness of breath.  . allopurinol (ZYLOPRIM) 100 MG tablet Take 100 mg by mouth as needed.   . betamethasone dipropionate (DIPROLENE) 0.05 % ointment Apply topically daily.   . budesonide-formoterol (SYMBICORT) 160-4.5 MCG/ACT inhaler Inhale 2 puffs into the lungs 2 (two) times daily as needed (shortness of breath).   . carvedilol (COREG) 3.125 MG tablet Take 1 tablet (3.125 mg total) by mouth 2 (two) times daily with a meal for 30 days.  . cetirizine (ZYRTEC) 10 MG tablet Take 10 mg by mouth daily.   .  Cholecalciferol (VITAMIN D-3) 5000 UNITS TABS Take 1 tablet by mouth daily.  Marland Kitchen dexlansoprazole (DEXILANT) 60 MG capsule Take 60 mg by mouth daily.   Marland Kitchen diltiazem (TIAZAC) 360 MG 24 hr capsule Take 360 mg by mouth daily.   . Fluocinolone Acetonide Scalp 0.01 % OIL as needed. Pt uses every other day  . furosemide (LASIX) 20 MG tablet Take 0.5 tablets (10 mg total) by mouth daily for 30 days.  Marland Kitchen gabapentin (NEURONTIN) 300 MG capsule 300 mg 3 (three) times daily.   Marland Kitchen [place]  BACITRACIN ZINC ointment Apply 1 application topically 2 (two) times daily.   Marland Kitchen ketoconazole (NIZORAL) 2 % shampoo Apply 1 application topically 2 (two) times a week.   . levothyroxine (SYNTHROID, LEVOTHROID) 150 MCG tablet Take 1 tablet (150 mcg total) by mouth daily before breakfast for 30 days.  . meclizine (ANTIVERT) 25 MG tablet Take 1 tablet (25 mg total) by mouth 3 (three) times daily as needed.  . montelukast (SINGULAIR) 10 MG tablet Take 10 mg by mouth at bedtime.   Marland Kitchen oxybutynin (DITROPAN) 5 MG tablet TAKE (1) TABLET BY MOUTH THREE TIMES DAILY  . potassium chloride SA (K-DUR,KLOR-CON) 20 MEQ tablet Take 1 tablet (20 mEq total) by mouth every other day.  . pravastatin (PRAVACHOL) 40 MG tablet Take 40 mg by mouth daily.   . sertraline (ZOLOFT) 50 MG tablet Take 50 mg by mouth daily.   . traZODone (DESYREL) 150 MG tablet Take 150 mg by mouth at bedtime.   . triamterene-hydrochlorothiazide (MAXZIDE-25) 37.5-25 MG tablet Take 1 tablet by mouth daily.   . [DISCONTINUED] aspirin EC 81 MG EC tablet Take 2 tablets (162 mg total) by mouth daily. (Patient not taking: Reported on 02/04/2019)  . [DISCONTINUED] Cholecalciferol (VITAMIN D) 125 MCG (5000 UT) CAPS   . [DISCONTINUED] clindamycin (CLEOCIN) 300 MG capsule   . [DISCONTINUED] ELIQUIS 5 MG TABS tablet Take 5 mg by mouth 2 (two) times daily.   . [DISCONTINUED] ferrous sulfate 325 (65 FE) MG tablet Take 1 tablet (325 mg total) by mouth daily with breakfast for 30 days.  .  [DISCONTINUED] imipramine (TOFRANIL) 25 MG tablet Take by mouth.  . [DISCONTINUED] meclizine (ANTIVERT) 25 MG tablet Take 25 mg by mouth as needed.   . [DISCONTINUED] methylPREDNISolone (MEDROL DOSEPAK) 4 MG TBPK tablet Take 6 pills on day one then decrease by 1 pill each day (Patient not taking: Reported on 02/04/2019)  . [DISCONTINUED] MOVANTIK 12.5 MG TABS tablet   . [DISCONTINUED] Plecanatide (TRULANCE) 3 MG TABS Take by mouth.  . [DISCONTINUED] predniSONE (DELTASONE) 10 MG tablet   . [DISCONTINUED] solifenacin (VESICARE) 10 MG tablet Take by mouth.  . [DISCONTINUED] triamcinolone ointment (KENALOG) 0.1 %    No facility-administered encounter medications on file as of 02/04/2019.     ALLERGIES:  Allergies  Allergen Reactions  . Tetracyclines & Related Other (See Comments)    Unknown  . Shellfish-Derived Products Nausea And Vomiting     PHYSICAL EXAM:  ECOG Performance status: 2  Vitals:   02/04/19 1453  BP: (!) 146/67  Pulse: 69  Resp: 16  Temp: 97.8 F (36.6 C)  SpO2: 99%   Filed Weights   02/04/19 1453  Weight: 181 lb 12.8 oz (82.5 kg)    Physical Exam Constitutional:      Appearance: Normal appearance. She is obese.  HENT:     Right Ear: External ear normal.     Left Ear: External ear normal.     Nose: Nose normal.     Mouth/Throat:     Pharynx: Oropharynx is clear.  Eyes:     Conjunctiva/sclera: Conjunctivae normal.  Neck:     Musculoskeletal: Normal range of motion.  Cardiovascular:     Rate and Rhythm: Normal rate and regular rhythm.     Pulses: Normal pulses.     Heart sounds: Normal heart sounds.  Pulmonary:     Effort: Pulmonary effort is normal.     Breath sounds: Normal breath sounds.  Abdominal:     General: Bowel sounds  are normal.  Genitourinary:    General: Normal vulva.  Musculoskeletal:     Comments: Decreased range of motion  Skin:    Findings: Rash present.     Comments: Generalized vesicles over the entire body.  Neurological:      General: No focal deficit present.     Mental Status: She is alert and oriented to person, place, and time.  Psychiatric:        Mood and Affect: Mood normal.        Behavior: Behavior normal.        Thought Content: Thought content normal.        Judgment: Judgment normal.      LABORATORY DATA:  I have reviewed the labs as listed.  CBC    Component Value Date/Time   WBC 5.7 01/28/2019 1246   RBC 3.79 (L) 01/28/2019 1246   HGB 11.7 (L) 01/28/2019 1246   HCT 36.1 01/28/2019 1246   PLT 193 01/28/2019 1246   MCV 95.3 01/28/2019 1246   MCH 30.9 01/28/2019 1246   MCHC 32.4 01/28/2019 1246   RDW 13.4 01/28/2019 1246   LYMPHSABS 1.4 01/28/2019 1246   MONOABS 0.5 01/28/2019 1246   EOSABS 0.8 (H) 01/28/2019 1246   BASOSABS 0.1 01/28/2019 1246   CMP Latest Ref Rng & Units 01/28/2019 10/28/2018 09/23/2018  Glucose 70 - 99 mg/dL 93 118(H) 94  BUN 8 - 23 mg/dL _0 Creatinine 0.44 - 1.00 mg/dL 0.92 0.94 0.95  Sodium 135 - 145 mmol/L 138 131(L) 137  Potassium 3.5 - 5.1 mmol/L 3.9 3.7 4.1  Chloride 98 - 111 mmol/L 106 91(L) 101  CO2 22 - 32 mmol/L 24 32 29  Calcium 8.9 - 10.3 mg/dL 10.0 10.1 10.0  Total Protein 6.5 - 8.1 g/dL 6.1(L) - -  Total Bilirubin 0.3 - 1.2 mg/dL 0.5 - -  Alkaline Phos 38 - 126 U/L 70 - -  AST 15 - 41 U/L 19 - -  ALT 0 - 44 U/L 15 - -          ASSESSMENT & PLAN:   Anemia in chronic kidney disease (CKD) 1.  Iron deficiency anemia: - Hemoglobin on 08/18/2018 was 6.9 with MCV of 74.2.  Received 1 unit PRBC. -Iron panel on 08/16/2018 showed ferritin of 79% saturation of 2. -Bone marrow biopsy on 04/22/2014 showed hypercellular bone marrow with trilineage hematopoiesis.  CT scan on 03/26/2017 showed spleen within normal limits. -Colonoscopy was on 04/07/2015, normal exam. -EGD on 01/31/2016 shows duodenal ulcer, erythematous gastric mucosa and duodenal stenosis. - She took iron pill which caused severe constipation.  Hence this was discontinued. -She  received Feraheme on 09/30/2018 and 10/07/2018. -Labs are stable.  No indication for parenteral iron at this time.  2.  Generalized rash -Patient has generalized rash starting her scalp all the way down to her feet.  The rash appears to be consistent with vesicles.  The patient again has had this rash for approximately 1 year.  She has been seen by multiple dermatologists and had biopsies as well that were all inconclusive.  The rash is associated with significant pruritus.  The amount of pruritus that the patient has is starting to affect her mentally.  I would like for the patient to be evaluated at [facility] dermatology to find the etiology of rash.   3. History  DVT: -She is on Eliquis 5 mg twice daily.  4.   Smoking history: - She reportedly smoked 84-pack-years. -CT low-dose  scan on 10/08/2017 shows lung RADS 2.  She will continue annual screening.        [place] [phone]

## 2019-02-05 NOTE — Assessment & Plan Note (Signed)
1.  Iron deficiency anemia: - Hemoglobin on 08/18/2018 was 6.9 with MCV of 74.2.  Received 1 unit PRBC. -Iron panel on 08/16/2018 showed ferritin of 79% saturation of 2. -Bone marrow biopsy on 04/22/2014 showed hypercellular bone marrow with trilineage hematopoiesis.  CT scan on 03/26/2017 showed spleen within normal limits. -Colonoscopy was on 04/07/2015, normal exam. -EGD on 01/31/2016 shows duodenal ulcer, erythematous gastric mucosa and duodenal stenosis. - She took iron pill which caused severe constipation.  Hence this was discontinued. -She received Feraheme on 09/30/2018 and 10/07/2018. -Labs are stable.  No indication for parenteral iron at this time.  2.  Generalized rash -Patient has generalized rash starting her scalp all the way down to her feet.  The rash appears to be consistent with vesicles.  The patient again has had this rash for approximately 1 year.  She has been seen by multiple dermatologists and had biopsies as well that were all inconclusive.  The rash is associated with significant pruritus.  The amount of pruritus that the patient has is starting to affect her mentally.  I would like for the patient to be evaluated at [facility] dermatology to find the etiology of rash.   3. History  DVT: -She is on Eliquis 5 mg twice daily.  4.   Smoking history: - She reportedly smoked 84-pack-years. -CT low-dose scan on 10/08/2017 shows lung RADS 2.  She will continue annual screening.

## 2019-03-26 ENCOUNTER — Other Ambulatory Visit: Payer: Self-pay | Admitting: Obstetrics & Gynecology

## 2019-05-21 ENCOUNTER — Other Ambulatory Visit: Payer: Self-pay | Admitting: Nurse Practitioner

## 2019-06-03 ENCOUNTER — Other Ambulatory Visit (HOSPITAL_COMMUNITY): Payer: Self-pay | Admitting: *Deleted

## 2019-06-03 DIAGNOSIS — D509 Iron deficiency anemia, unspecified: Secondary | ICD-10-CM

## 2019-06-03 DIAGNOSIS — N183 Chronic kidney disease, stage 3 unspecified: Secondary | ICD-10-CM

## 2019-06-03 DIAGNOSIS — D696 Thrombocytopenia, unspecified: Secondary | ICD-10-CM

## 2019-06-04 ENCOUNTER — Inpatient Hospital Stay (HOSPITAL_COMMUNITY): Payer: 59 | Attending: Nurse Practitioner | Admitting: Nurse Practitioner

## 2019-06-04 ENCOUNTER — Other Ambulatory Visit: Payer: Self-pay

## 2019-06-04 ENCOUNTER — Inpatient Hospital Stay (HOSPITAL_COMMUNITY): Payer: 59

## 2019-06-04 DIAGNOSIS — Z86718 Personal history of other venous thrombosis and embolism: Secondary | ICD-10-CM | POA: Diagnosis not present

## 2019-06-04 DIAGNOSIS — F17210 Nicotine dependence, cigarettes, uncomplicated: Secondary | ICD-10-CM | POA: Diagnosis not present

## 2019-06-04 DIAGNOSIS — N183 Chronic kidney disease, stage 3 unspecified: Secondary | ICD-10-CM

## 2019-06-04 DIAGNOSIS — I129 Hypertensive chronic kidney disease with stage 1 through stage 4 chronic kidney disease, or unspecified chronic kidney disease: Secondary | ICD-10-CM | POA: Insufficient documentation

## 2019-06-04 DIAGNOSIS — D509 Iron deficiency anemia, unspecified: Secondary | ICD-10-CM

## 2019-06-04 DIAGNOSIS — N189 Chronic kidney disease, unspecified: Secondary | ICD-10-CM | POA: Insufficient documentation

## 2019-06-04 DIAGNOSIS — D631 Anemia in chronic kidney disease: Secondary | ICD-10-CM | POA: Insufficient documentation

## 2019-06-04 DIAGNOSIS — Z7901 Long term (current) use of anticoagulants: Secondary | ICD-10-CM | POA: Insufficient documentation

## 2019-06-04 DIAGNOSIS — Z79899 Other long term (current) drug therapy: Secondary | ICD-10-CM | POA: Diagnosis not present

## 2019-06-04 DIAGNOSIS — D696 Thrombocytopenia, unspecified: Secondary | ICD-10-CM

## 2019-06-04 LAB — CBC WITH DIFFERENTIAL/PLATELET
Abs Immature Granulocytes: 0.02 10*3/uL (ref 0.00–0.07)
Basophils Absolute: 0.1 10*3/uL (ref 0.0–0.1)
Basophils Relative: 1 %
Eosinophils Absolute: 1.1 10*3/uL — ABNORMAL HIGH (ref 0.0–0.5)
Eosinophils Relative: 18 %
HCT: 37.2 % (ref 36.0–46.0)
Hemoglobin: 12.3 g/dL (ref 12.0–15.0)
Immature Granulocytes: 0 %
Lymphocytes Relative: 22 %
Lymphs Abs: 1.3 10*3/uL (ref 0.7–4.0)
MCH: 32.3 pg (ref 26.0–34.0)
MCHC: 33.1 g/dL (ref 30.0–36.0)
MCV: 97.6 fL (ref 80.0–100.0)
Monocytes Absolute: 0.5 10*3/uL (ref 0.1–1.0)
Monocytes Relative: 8 %
Neutro Abs: 3 10*3/uL (ref 1.7–7.7)
Neutrophils Relative %: 51 %
Platelets: 215 10*3/uL (ref 150–400)
RBC: 3.81 MIL/uL — ABNORMAL LOW (ref 3.87–5.11)
RDW: 12.9 % (ref 11.5–15.5)
WBC: 6 10*3/uL (ref 4.0–10.5)
nRBC: 0 % (ref 0.0–0.2)

## 2019-06-04 LAB — COMPREHENSIVE METABOLIC PANEL
ALT: 20 U/L (ref 0–44)
AST: 16 U/L (ref 15–41)
Albumin: 3.5 g/dL (ref 3.5–5.0)
Alkaline Phosphatase: 78 U/L (ref 38–126)
Anion gap: 7 (ref 5–15)
BUN: 19 mg/dL (ref 8–23)
CO2: 29 mmol/L (ref 22–32)
Calcium: 9.8 mg/dL (ref 8.9–10.3)
Chloride: 102 mmol/L (ref 98–111)
Creatinine, Ser: 1.1 mg/dL — ABNORMAL HIGH (ref 0.44–1.00)
GFR calc Af Amer: 57 mL/min — ABNORMAL LOW (ref >60)
GFR calc non Af Amer: 49 mL/min — ABNORMAL LOW (ref >60)
Glucose, Bld: 113 mg/dL — ABNORMAL HIGH (ref 70–99)
Potassium: 3.8 mmol/L (ref 3.5–5.1)
Sodium: 138 mmol/L (ref 135–145)
Total Bilirubin: 0.4 mg/dL (ref 0.3–1.2)
Total Protein: 6.5 g/dL (ref 6.5–8.1)

## 2019-06-04 LAB — VITAMIN B12: Vitamin B-12: 227 pg/mL (ref 180–914)

## 2019-06-04 LAB — FERRITIN: Ferritin: 84 ng/mL (ref 11–307)

## 2019-06-04 LAB — IRON AND TIBC
Iron: 62 ug/dL (ref 28–170)
Saturation Ratios: 20 % (ref 10.4–31.8)
TIBC: 304 ug/dL (ref 250–450)
UIBC: 242 ug/dL

## 2019-06-04 LAB — FOLATE: Folate: 9.5 ng/mL (ref >5.9)

## 2019-06-04 NOTE — Patient Instructions (Signed)
[facility] Discharge Instructions  Follow up in 6 months with labs    Thank you for choosing [facility] at [facility] to provide your oncology and hematology care.  To afford each patient quality time with our provider, please arrive at least 15 minutes before your scheduled appointment time.   If you have a lab appointment with the Cancer Center please come in thru the Main Entrance and check in at the main information desk.  You need to re-schedule your appointment should you arrive 10 or more minutes late.  We strive to give you quality time with our providers, and arriving late affects you and other patients whose appointments are after yours.  Also, if you no show three or more times for appointments you may be dismissed from the clinic at the providers discretion.     Again, thank you for choosing [facility].  Our hope is that these requests will decrease the amount of time that you wait before being seen by our physicians.       _____________________________________________________________  Should you have questions after your visit to [facility], please contact our office at [phone] between the hours of 8:00 a.m. and 4:30 p.m.  Voicemails left after 4:00 p.m. will not be returned until the following business day.  For prescription refill requests, have your pharmacy contact our office and allow 72 hours.    Due to Covid, you will need to wear a mask upon entering the hospital. If you do not have a mask, a mask will be given to you at the Main Entrance upon arrival. For doctor visits, patients may have 1 support person with them. For treatment visits, patients can not have anyone with them due to social distancing guidelines and our immunocompromised population.      

## 2019-06-04 NOTE — Progress Notes (Signed)
[name], [place] [address]   CLINIC:  Medical Oncology/Hematology  PCP:  [name], NP [address] [place] [address] [phone]   REASON FOR VISIT: Follow-up for iron deficiency anemia  CURRENT THERAPY: Intermittent iron infusions   INTERVAL HISTORY:  [name] 74 y.o. female returns for routine follow-up for iron deficiency anemia.  Patient reports she has been doing well since her last visit.  She denies any bright bleeding per rectum or melena. Denies any nausea, vomiting, or diarrhea. Denies any new pains. Had not noticed any recent bleeding such as epistaxis, hematuria or hematochezia. Denies recent chest pain on exertion, shortness of breath on minimal exertion, pre-syncopal episodes, or palpitations. Denies any numbness or tingling in hands or feet. Denies any recent fevers, infections, or recent hospitalizations. Patient reports appetite at 100% and energy level at 50%.  She is eating well maintain her weight at this time.    REVIEW OF SYSTEMS:  Review of Systems  Respiratory: Positive for cough and shortness of breath (On exertion).   Gastrointestinal: Positive for constipation.  Neurological: Positive for numbness.  All other systems reviewed and are negative.    PAST MEDICAL/SURGICAL HISTORY:  Past Medical History:  Diagnosis Date  . Anxiety   . Carpal tunnel syndrome   . COPD (chronic obstructive pulmonary disease) ([place])   . Coronary artery disease   . Gastric ulcer   . GERD (gastroesophageal reflux disease)   . Hyperglycemia   . Hyperlipidemia   . Hypertension   . Hypothyroidism   . Osteoarthritis   . Peptic ulcer disease   . Pneumonia 2010  . Renal insufficiency   . 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 Stroke ([place]) 1990s   mild  . TIA (transient ischemic attack)    Past Surgical History:  Procedure Laterality Date  . ABDOMINAL HYSTERECTOMY    . APPENDECTOMY    . BACK SURGERY    . BONE MARROW  ASPIRATION Left 04/22/14  . BONE MARROW BIOPSY Left 04/22/14  . CHOLECYSTECTOMY    . COLONOSCOPY     ?2005, [place]  . COLONOSCOPY N/A 04/07/2015   [id number]  . COLONOSCOPY WITH ESOPHAGOGASTRODUODENOSCOPY (EGD)  02/11/2012   RMR: Ulcerative/erosive reflux esophagitis, benign appearing gastric ulcer with unremarkable biopsy, no H pylori. Colonoscopy was unremarkable. Next colonoscopy in 2023  . CYSTECTOMY     cyst removed from rectal area.   . ESOPHAGOGASTRODUODENOSCOPY  01/05/2003   RMR: Normal esophagus, small hiatal hernia/ A couple of tiny antral erosions, otherwise normal stomach normal D1 and D2.   . ESOPHAGOGASTRODUODENOSCOPY N/A 08/11/2014   STM:HDQQIWL ulcer/HH  . ESOPHAGOGASTRODUODENOSCOPY N/A 12/01/2014   NLG:[id number] improved gastric ulcerations/p bx  . ESOPHAGOGASTRODUODENOSCOPY N/A 01/31/2016   Procedure: ESOPHAGOGASTRODUODENOSCOPY (EGD);  Surgeon: Daneil Dolin, MD;  [facility]: [facility];  Service: Endoscopy;  Laterality: N/A;  745  . HEMORRHOID SURGERY    . JOINT REPLACEMENT Right    knee  . MALONEY DILATION N/A 01/31/2016   Procedure: Venia Minks DILATION;  Surgeon: Daneil Dolin, MD;  Location: [facility];  Service: Endoscopy;  Laterality: N/A;     SOCIAL HISTORY:  Social History   Socioeconomic History  . Marital status: Married    Spouse name: Not on file  . Number of children: 3  . Years of education: Not on file  . Highest education level: Not on file  Occupational History  . Not on file  Tobacco Use  .  Smoking status: Current Every Day Smoker    Packs/day: 1.00    Years: 58.00    Pack years: 58.00    Types: Cigarettes  . Smokeless tobacco: Never Used  Substance and Sexual Activity  . Alcohol use: No    Alcohol/week: 0.0 standard drinks  . Drug use: No  . Sexual activity: Not Currently  Other Topics Concern  . Not on file  Social History Narrative  . Not on file   Social Determinants of Health   Financial Resource Strain: Low  Risk   . Difficulty of Paying Living Expenses: Not hard at all  Food Insecurity: No Food Insecurity  . Worried About Charity fundraiser in the Last Year: Never true  . Ran Out of Food in the Last Year: Never true  Transportation Needs: No Transportation Needs  . Lack of Transportation (Medical): No  . Lack of Transportation (Non-Medical): No  Physical Activity: Insufficiently Active  . Days of Exercise per Week: 1 day  . Minutes of Exercise per Session: 10 min  Stress: No Stress Concern Present  . Feeling of Stress : Only a little  Social Connections: Slightly Isolated  . Frequency of Communication with Friends and Family: More than three times a week  . Frequency of Social Gatherings with Friends and Family: Twice a week  . Attends Religious Services: More than 4 times per year  . Active Member of Clubs or Organizations: No  . Attends Archivist Meetings: Never  . Marital Status: Married  Human resources officer Violence: Not At Risk  . Fear of Current or Ex-Partner: No  . Emotionally Abused: No  . Physically Abused: No  . Sexually Abused: No    FAMILY HISTORY:  Family History  Problem Relation Age of Onset  . Other Mother        died age 52, natural causes  . Stroke Mother   . Diabetes Daughter   . Healthy Son   . Healthy Daughter   . Healthy Daughter   . Colon cancer Neg Hx   . Liver disease Neg Hx     CURRENT MEDICATIONS:  Outpatient Encounter Medications as of 06/04/2019  Medication Sig  . acyclovir (ZOVIRAX) 400 MG tablet Take 400 mg by mouth daily.   . betamethasone dipropionate (DIPROLENE) 0.05 % ointment Apply topically daily.   . carvedilol (COREG) 3.125 MG tablet Take 1 tablet (3.125 mg total) by mouth 2 (two) times daily with a meal for 30 days.  . cetirizine (ZYRTEC) 10 MG tablet Take 10 mg by mouth daily.   . Cholecalciferol (VITAMIN D-3) 5000 UNITS TABS Take 1 tablet by mouth daily.  Marland Kitchen DEXILANT 60 MG capsule TAKE (1) CAPSULE BY MOUTH ONCE DAILY.  Marland Kitchen  diltiazem (TIAZAC) 360 MG 24 hr capsule Take 360 mg by mouth daily.   . Fluocinolone Acetonide Scalp 0.01 % OIL as needed. Pt uses every other day  . gabapentin (NEURONTIN) 300 MG capsule 300 mg 3 (three) times daily.   Marland Kitchen GNP BACITRACIN ZINC ointment Apply 1 application topically 2 (two) times daily.   Marland Kitchen levothyroxine (SYNTHROID, LEVOTHROID) 150 MCG tablet Take 1 tablet (150 mcg total) by mouth daily before breakfast for 30 days.  . montelukast (SINGULAIR) 10 MG tablet Take 10 mg by mouth at bedtime.   Marland Kitchen oxybutynin (DITROPAN) 5 MG tablet TAKE (1) TABLET BY MOUTH THREE TIMES DAILY  . potassium chloride SA (K-DUR,KLOR-CON) 20 MEQ tablet Take 1 tablet (20 mEq total) by mouth every other day.  Marland Kitchen  pravastatin (PRAVACHOL) 40 MG tablet Take 40 mg by mouth daily.   . sertraline (ZOLOFT) 50 MG tablet Take 50 mg by mouth daily.   . traZODone (DESYREL) 150 MG tablet Take 150 mg by mouth at bedtime.   . triamterene-hydrochlorothiazide (MAXZIDE-25) 37.5-25 MG tablet Take 1 tablet by mouth daily.   . [DISCONTINUED] ketoconazole (NIZORAL) 2 % shampoo Apply 1 application topically 2 (two) times a week.   Marland Kitchen albuterol (PROVENTIL HFA;VENTOLIN HFA) 108 (90 Base) MCG/ACT inhaler Inhale 2 puffs into the lungs every 4 (four) hours as needed for wheezing or shortness of breath. (Patient not taking: Reported on 06/04/2019)  . allopurinol (ZYLOPRIM) 100 MG tablet Take 100 mg by mouth as needed.   Marland Kitchen aspirin 81 MG EC tablet Take by mouth.  . budesonide-formoterol (SYMBICORT) 160-4.5 MCG/ACT inhaler Inhale 2 puffs into the lungs 2 (two) times daily as needed (shortness of breath).   . colchicine 0.6 MG tablet Take 0.6 mg by mouth as needed.   . furosemide (LASIX) 20 MG tablet Take 0.5 tablets (10 mg total) by mouth daily for 30 days. (Patient not taking: Reported on 06/04/2019)  . meclizine (ANTIVERT) 25 MG tablet Take 1 tablet (25 mg total) by mouth 3 (three) times daily as needed. (Patient not taking: Reported on 06/04/2019)    No facility-administered encounter medications on file as of 06/04/2019.    ALLERGIES:  Allergies  Allergen Reactions  . Tetracyclines & Related Other (See Comments)    Unknown  . Shellfish-Derived Products Nausea And Vomiting     PHYSICAL EXAM:  ECOG Performance status: 1  Vitals:   06/04/19 1103  BP: (!) 117/52  Pulse: 73  Resp: 17  Temp: (!) 96.8 F (36 C)  SpO2: 97%   Filed Weights   06/04/19 1103  Weight: 181 lb 8 oz (82.3 kg)    Physical Exam Constitutional:      Appearance: Normal appearance. She is normal weight.  Cardiovascular:     Rate and Rhythm: Normal rate and regular rhythm.     Heart sounds: Normal heart sounds.  Pulmonary:     Effort: Pulmonary effort is normal.     Breath sounds: Normal breath sounds.  Abdominal:     General: Bowel sounds are normal.     Palpations: Abdomen is soft.  Musculoskeletal:        General: Normal range of motion.  Skin:    General: Skin is warm.  Neurological:     Mental Status: She is alert and oriented to person, place, and time. Mental status is at baseline.  Psychiatric:        Mood and Affect: Mood normal.        Behavior: Behavior normal.        Thought Content: Thought content normal.        Judgment: Judgment normal.      LABORATORY DATA:  I have reviewed the labs as listed.  CBC    Component Value Date/Time   WBC 6.0 06/04/2019 1039   RBC 3.81 (L) 06/04/2019 1039   HGB 12.3 06/04/2019 1039   HCT 37.2 06/04/2019 1039   PLT 215 06/04/2019 1039   MCV 97.6 06/04/2019 1039   MCH 32.3 06/04/2019 1039   MCHC 33.1 06/04/2019 1039   RDW 12.9 06/04/2019 1039   LYMPHSABS 1.3 06/04/2019 1039   MONOABS 0.5 06/04/2019 1039   EOSABS 1.1 (H) 06/04/2019 1039   BASOSABS 0.1 06/04/2019 1039   CMP Latest Ref Rng & Units 06/04/2019 01/28/2019  10/28/2018  Glucose 70 - 99 mg/dL 113(H) 93 118(H)  BUN 8 - 23 mg/dL 19 20 14   Creatinine 0.44 - 1.00 mg/dL 1.10(H) 0.92 0.94  Sodium 135 - 145 mmol/L 138 138 131(L)   Potassium 3.5 - 5.1 mmol/L 3.8 3.9 3.7  Chloride 98 - 111 mmol/L 102 106 91(L)  CO2 22 - 32 mmol/L 29 24 32  Calcium 8.9 - 10.3 mg/dL 9.8 10.0 10.1  Total Protein 6.5 - 8.1 g/dL 6.5 6.1(L) -  Total Bilirubin 0.3 - 1.2 mg/dL 0.4 0.5 -  Alkaline Phos 38 - 126 U/L 78 70 -  AST 15 - 41 U/L 16 19 -  ALT 0 - 44 U/L 20 15 -   .  I personally performed a face-to-face visit,   All questions were answered to patient's stated satisfaction. Encouraged patient to call with any new concerns or questions before his next visit to the cancer center and we can certain see him sooner, if needed.     ASSESSMENT & PLAN:   Anemia in chronic kidney disease (CKD) 1.  Iron deficiency anemia: -Hemoglobin on 08/18/2018 was 6.9 with MCV of 74.2.  She received 1 unit of PRBC. -Bone marrow biopsy on 04/22/2014 showed hypercellular bone marrow with trilineage hematopoiesis. -CT scan on 03/26/2017 showed spleen with normal limits. -Colonoscopy on 04/07/2015 was normal exam. -EGD on 01/31/2016 showed duodenal ulcer, erythrematous gastric mucosa and duodenal stenosis. -She took iron pills which caused severe constipation.  Hence was discontinued. -She last received Feraheme on 09/30/2018 and 10/07/2018. -Labs done on 06/04/2019 showed hemoglobin 12.3, ferritin 84, percent saturation 20 -I not recommend any IV iron at this time. -She will follow-up in 6 months with repeat labs.  2.  History of DVT: -She is on Eliquis 5 mg twice daily.  3.  Smoking history: -She reportedly smoked 84 pack years. -Low-dose CT scan on 10/08/2017 showed lung RADS 2. -We will set up her scan at her next visit      Orders placed this encounter:  Orders Placed This Encounter  Procedures  . Lactate dehydrogenase  . CBC with Differential/Platelet  . Comprehensive metabolic panel  . Ferritin  . Iron and TIBC  . Vitamin B12  . VITAMIN D 25 Hydroxy (Vit-D Deficiency, Fractures)  . Folate      Francene Finders, FNP-C [place] [phone]

## 2019-06-04 NOTE — Assessment & Plan Note (Addendum)
1.  Iron deficiency anemia: -Hemoglobin on 08/18/2018 was 6.9 with MCV of 74.2.  She received 1 unit of PRBC. -Bone marrow biopsy on 04/22/2014 showed hypercellular bone marrow with trilineage hematopoiesis. -CT scan on 03/26/2017 showed spleen with normal limits. -Colonoscopy on 04/07/2015 was normal exam. -EGD on 01/31/2016 showed duodenal ulcer, erythrematous gastric mucosa and duodenal stenosis. -She took iron pills which caused severe constipation.  Hence was discontinued. -She last received Feraheme on 09/30/2018 and 10/07/2018. -Labs done on 06/04/2019 showed hemoglobin 12.3, ferritin 84, percent saturation 20 -I not recommend any IV iron at this time. -She will follow-up in 6 months with repeat labs.  2.  History of DVT: -She is on Eliquis 5 mg twice daily.  3.  Smoking history: -She reportedly smoked 84 pack years. -Low-dose CT scan on 10/08/2017 showed lung RADS 2. -We will set up her scan at her next visit

## 2019-06-15 ENCOUNTER — Other Ambulatory Visit: Payer: Self-pay

## 2019-06-15 ENCOUNTER — Emergency Department (HOSPITAL_COMMUNITY): Payer: 59

## 2019-06-15 ENCOUNTER — Encounter (HOSPITAL_COMMUNITY): Payer: Self-pay | Admitting: *Deleted

## 2019-06-15 ENCOUNTER — Emergency Department (HOSPITAL_COMMUNITY)
Admission: EM | Admit: 2019-06-15 | Discharge: 2019-06-15 | Disposition: A | Discharge status: Home / Self Care | Payer: 59 | Attending: Emergency Medicine | Admitting: Emergency Medicine

## 2019-06-15 DIAGNOSIS — Z20822 Contact with and (suspected) exposure to covid-19: Secondary | ICD-10-CM | POA: Insufficient documentation

## 2019-06-15 DIAGNOSIS — Z79899 Other long term (current) drug therapy: Secondary | ICD-10-CM | POA: Diagnosis not present

## 2019-06-15 DIAGNOSIS — F17210 Nicotine dependence, cigarettes, uncomplicated: Secondary | ICD-10-CM | POA: Insufficient documentation

## 2019-06-15 DIAGNOSIS — I2510 Atherosclerotic heart disease of native coronary artery without angina pectoris: Secondary | ICD-10-CM | POA: Insufficient documentation

## 2019-06-15 DIAGNOSIS — I10 Essential (primary) hypertension: Secondary | ICD-10-CM | POA: Diagnosis not present

## 2019-06-15 DIAGNOSIS — J40 Bronchitis, not specified as acute or chronic: Secondary | ICD-10-CM

## 2019-06-15 DIAGNOSIS — E039 Hypothyroidism, unspecified: Secondary | ICD-10-CM | POA: Insufficient documentation

## 2019-06-15 DIAGNOSIS — J449 Chronic obstructive pulmonary disease, unspecified: Secondary | ICD-10-CM | POA: Diagnosis not present

## 2019-06-15 DIAGNOSIS — R0602 Shortness of breath: Secondary | ICD-10-CM | POA: Diagnosis present

## 2019-06-15 LAB — BASIC METABOLIC PANEL
Anion gap: 7 (ref 5–15)
BUN: 15 mg/dL (ref 8–23)
CO2: 25 mmol/L (ref 22–32)
Calcium: 9.7 mg/dL (ref 8.9–10.3)
Chloride: 107 mmol/L (ref 98–111)
Creatinine, Ser: 0.94 mg/dL (ref 0.44–1.00)
GFR calc Af Amer: >60 mL/min (ref >60)
GFR calc non Af Amer: 59 mL/min — ABNORMAL LOW (ref >60)
Glucose, Bld: 95 mg/dL (ref 70–99)
Potassium: 3.7 mmol/L (ref 3.5–5.1)
Sodium: 139 mmol/L (ref 135–145)

## 2019-06-15 LAB — CBC WITH DIFFERENTIAL/PLATELET
Abs Immature Granulocytes: 0.03 10*3/uL (ref 0.00–0.07)
Basophils Absolute: 0.1 10*3/uL (ref 0.0–0.1)
Basophils Relative: 1 %
Eosinophils Absolute: 0.8 10*3/uL — ABNORMAL HIGH (ref 0.0–0.5)
Eosinophils Relative: 14 %
HCT: 36.9 % (ref 36.0–46.0)
Hemoglobin: 11.8 g/dL — ABNORMAL LOW (ref 12.0–15.0)
Immature Granulocytes: 1 %
Lymphocytes Relative: 23 %
Lymphs Abs: 1.4 10*3/uL (ref 0.7–4.0)
MCH: 31.1 pg (ref 26.0–34.0)
MCHC: 32 g/dL (ref 30.0–36.0)
MCV: 97.4 fL (ref 80.0–100.0)
Monocytes Absolute: 0.6 10*3/uL (ref 0.1–1.0)
Monocytes Relative: 10 %
Neutro Abs: 3.1 10*3/uL (ref 1.7–7.7)
Neutrophils Relative %: 51 %
Platelets: 184 10*3/uL (ref 150–400)
RBC: 3.79 MIL/uL — ABNORMAL LOW (ref 3.87–5.11)
RDW: 12.6 % (ref 11.5–15.5)
WBC: 6 10*3/uL (ref 4.0–10.5)
nRBC: 0 % (ref 0.0–0.2)

## 2019-06-15 LAB — BRAIN NATRIURETIC PEPTIDE: B Natriuretic Peptide: 40 pg/mL (ref 0.0–100.0)

## 2019-06-15 MED ORDER — ALBUTEROL SULFATE HFA 108 (90 BASE) MCG/ACT IN AERS
4.0000 | INHALATION_SPRAY | Freq: Once | RESPIRATORY_TRACT | Status: AC
  Administered 2019-06-15: 4 via RESPIRATORY_TRACT
  Filled 2019-06-15: qty 6.7

## 2019-06-15 MED ORDER — PREDNISONE 20 MG PO TABS: 40.0000 mg | ORAL_TABLET | Freq: Every day | ORAL | 0 refills | Status: DC

## 2019-06-15 MED ORDER — PREDNISONE 20 MG PO TABS
40.0000 mg | ORAL_TABLET | Freq: Once | ORAL | Status: AC
  Administered 2019-06-15: 40 mg via ORAL
  Filled 2019-06-15: qty 2

## 2019-06-15 NOTE — ED Notes (Signed)
Family updated as to patient's status. Spoke with pt's husband with update

## 2019-06-15 NOTE — ED Triage Notes (Signed)
Pt with cough productive of yellow phlegm and fever for a week.  Pt sent here from [facility] in [place].

## 2019-06-16 LAB — SARS CORONAVIRUS 2 (TAT 6-24 HRS): SARS Coronavirus 2: NEGATIVE

## 2019-06-19 NOTE — ED Provider Notes (Signed)
[facility] EMERGENCY DEPARTMENT Provider Note   CSN: [id number] Arrival date & time: 06/15/19  1537     History Chief Complaint  Patient presents with  . Cough    Connie Ponce is a 74 y.o. female.  HPI   74 year old female with cough and shortness of breath.  Progressively worsening over the past several days.  Cough is occasionally productive for yellow sputum.  Mild shortness of breath.  No fevers or chills.  Some chest soreness but not really pain per se.  No unusual leg swelling.  No sick contacts that she is aware of.  Past Medical History:  Diagnosis Date  . Anxiety   . Carpal tunnel syndrome   . COPD (chronic obstructive pulmonary disease) ([place])   . Coronary artery disease   . Gastric ulcer   . GERD (gastroesophageal reflux disease)   . Hyperglycemia   . Hyperlipidemia   . Hypertension   . Hypothyroidism   . Osteoarthritis   . Peptic ulcer disease   . Pneumonia 2010  . Renal insufficiency   . 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 Stroke ([place]) 1990s   mild  . TIA (transient ischemic attack)     Patient Active Problem List   Diagnosis Date Noted  . Microcytic anemia 09/23/2018  . Spinal stenosis of lumbar region 08/17/2018  . AKI (acute kidney injury) ([place])   . Syncope and collapse 03/28/2018  . COPD (chronic obstructive pulmonary disease) ([place]) 03/28/2018  . Tobacco abuse 03/28/2018  . Fall at home 03/28/2018  . CKD (chronic kidney disease) stage 3, GFR 30-59 ml/min 03/28/2018  . Anemia in chronic kidney disease (CKD) 03/28/2018  . Hyperlipidemia 03/28/2018  . Acute metabolic encephalopathy 84/16/6063  . Gait instability 10/03/2017  . Dysarthria 10/03/2017  . Essential hypertension 10/03/2017  . Slurred speech   . Abdominal mass, RUQ (right upper quadrant) 03/13/2017  . Abdominal pain 03/13/2017  . RUQ pain 03/13/2017  . Dysphagia   . TIA (transient ischemic attack) 01/03/2016  . Absolute anemia 07/03/2015  . Proctalgia   . Rectal pain 01/04/2015   . Hemorrhoids 01/04/2015  . Diarrhea 01/04/2015  . Gastric ulceration   . Gastric ulcer 11/29/2014  . Toxic metabolic encephalopathy 01/60/1093  . UTI (urinary tract infection) 09/19/2014  . ARF (acute renal failure) ([place])   . LUQ pain 07/28/2014  . Nausea with vomiting 07/28/2014  . Bilateral lower extremity edema 07/28/2014  . Thrombocytopenia ([place]) 04/12/2014  . RLQ abdominal pain 01/22/2012  . Constipation 01/22/2012  . GERD (gastroesophageal reflux disease) 01/22/2012    Past Surgical History:  Procedure Laterality Date  . ABDOMINAL HYSTERECTOMY    . APPENDECTOMY    . BACK SURGERY    . BONE MARROW ASPIRATION Left 04/22/14  . BONE MARROW BIOPSY Left 04/22/14  . CHOLECYSTECTOMY    . COLONOSCOPY     ?2005, [place]  . COLONOSCOPY N/A 04/07/2015   ATF:TDDUKG  . COLONOSCOPY WITH ESOPHAGOGASTRODUODENOSCOPY (EGD)  02/11/2012   RMR: Ulcerative/erosive reflux esophagitis, benign appearing gastric ulcer with unremarkable biopsy, no H pylori. Colonoscopy was unremarkable. Next colonoscopy in 2023  . CYSTECTOMY     cyst removed from rectal area.   . ESOPHAGOGASTRODUODENOSCOPY  01/05/2003   RMR: Normal esophagus, small hiatal hernia/ A couple of tiny antral erosions, otherwise normal stomach normal D1 and D2.   . ESOPHAGOGASTRODUODENOSCOPY N/A 08/11/2014   URK:YHCWCBJ ulcer/HH  . ESOPHAGOGASTRODUODENOSCOPY N/A 12/01/2014   SEG:[id number] improved gastric ulcerations/p bx  . ESOPHAGOGASTRODUODENOSCOPY N/A 01/31/2016  Procedure: ESOPHAGOGASTRODUODENOSCOPY (EGD);  Surgeon: Daneil Dolin, MD;  Location: [facility] ENDO SUITE;  Service: Endoscopy;  Laterality: N/A;  745  . HEMORRHOID SURGERY    . JOINT REPLACEMENT Right    knee  . MALONEY DILATION N/A 01/31/2016   Procedure: Venia Minks DILATION;  Surgeon: Daneil Dolin, MD;  Location: [facility] ENDO SUITE;  Service: Endoscopy;  Laterality: N/A;     OB History    Gravida  4   Para      Term      Preterm      AB      Living  3      SAB      TAB      Ectopic      Multiple      Live Births              Family History  Problem Relation Age of Onset  . Other Mother        died age 69, natural causes  . Stroke Mother   . Diabetes Daughter   . Healthy Son   . Healthy Daughter   . Healthy Daughter   . Colon cancer Neg Hx   . Liver disease Neg Hx     Social History   Tobacco Use  . Smoking status: Current Every Day Smoker    Packs/day: 1.00    Years: 58.00    Pack years: 58.00    Types: Cigarettes  . Smokeless tobacco: Never Used  Substance Use Topics  . Alcohol use: No    Alcohol/week: 0.0 standard drinks  . Drug use: No    Home Medications Prior to Admission medications   Medication Sig Start Date End Date Taking? Authorizing Provider  albuterol (PROVENTIL HFA;VENTOLIN HFA) 108 (90 Base) MCG/ACT inhaler Inhale 2 puffs into the lungs every 4 (four) hours as needed for wheezing or shortness of breath. 08/10/17  Yes Noemi Chapel, MD  aspirin 81 MG EC tablet Take 81 mg by mouth every morning.    Yes [provider]  augmented betamethasone dipropionate (DIPROLENE-AF) 0.05 % cream Apply 1 application topically daily as needed (for itching and irritation).   Yes [provider]  budesonide-formoterol (SYMBICORT) 160-4.5 MCG/ACT inhaler Inhale 2 puffs into the lungs 2 (two) times daily as needed (shortness of breath).    Yes [provider]  calcipotriene (DOVONOX) 0.005 % ointment Apply 1 application topically daily as needed (for itching/irritation).   Yes [provider]  carvedilol (COREG) 3.125 MG tablet Take 1 tablet (3.125 mg total) by mouth 2 (two) times daily with a meal for 30 days. 08/19/18 06/15/19 Yes Shah, Pratik D, DO  cetirizine (ZYRTEC) 10 MG tablet Take 10 mg by mouth every morning.  12/01/18  Yes [provider]  Cholecalciferol (VITAMIN D-3) 5000 UNITS TABS Take 1 tablet by mouth every morning.    Yes [provider]  DEXILANT 60  MG capsule TAKE (1) CAPSULE BY MOUTH ONCE DAILY. Patient taking differently: Take 60 mg by mouth every morning.  05/22/19  Yes Erenest Rasher, PA-C  furosemide (LASIX) 20 MG tablet Take 0.5 tablets (10 mg total) by mouth daily for 30 days. Patient taking differently: Take 10 mg by mouth every morning.  08/20/18 06/15/19 Yes Shah, Pratik D, DO  gabapentin (NEURONTIN) 300 MG capsule Take 300 mg by mouth 3 (three) times daily.  07/08/17  Yes [provider]  levothyroxine (SYNTHROID) 137 MCG tablet Take 137 mcg by mouth daily before  breakfast.   Yes [provider]  montelukast (SINGULAIR) 10 MG tablet Take 10 mg by mouth at bedtime.  09/08/18  Yes [provider]  oxybutynin (DITROPAN) 5 MG tablet TAKE (1) TABLET BY MOUTH THREE TIMES DAILY Patient taking differently: Take 5 mg by mouth 3 (three) times daily.  03/26/19  Yes Florian Buff, MD  potassium chloride SA (K-DUR,KLOR-CON) 20 MEQ tablet Take 1 tablet (20 mEq total) by mouth every other day. Patient taking differently: Take 20 mEq by mouth every morning.  04/03/18  Yes Johnson, Clanford L, MD  pravastatin (PRAVACHOL) 40 MG tablet Take 40 mg by mouth every morning.  12/01/18  Yes [provider]  sertraline (ZOLOFT) 50 MG tablet Take 50 mg by mouth every morning.  12/01/18  Yes [provider]  traZODone (DESYREL) 150 MG tablet Take 300 mg by mouth at bedtime.  10/01/17  Yes [provider]  triamterene-hydrochlorothiazide (MAXZIDE-25) 37.5-25 MG tablet Take 1 tablet by mouth every morning.  09/08/18  Yes [provider]  predniSONE (DELTASONE) 20 MG tablet Take 2 tablets (40 mg total) by mouth daily. 06/15/19   Virgel Manifold, MD    Allergies    Tetracyclines & related and Shellfish-derived products  Review of Systems   Review of Systems All systems reviewed and negative, other than as noted in HPI.  Physical Exam Updated Vital Signs BP 132/69   Pulse 76   Temp 98 F (36.7 C)  (Oral)   Resp 16   SpO2 94%   Physical Exam Vitals and nursing note reviewed.  Constitutional:      General: She is not in acute distress.    Appearance: She is well-developed.  HENT:     Head: Normocephalic and atraumatic.  Eyes:     General:        Right eye: No discharge.        Left eye: No discharge.     Conjunctiva/sclera: Conjunctivae normal.  Cardiovascular:     Rate and Rhythm: Normal rate and regular rhythm.     Heart sounds: Normal heart sounds. No murmur. No friction rub. No gallop.   Pulmonary:     Effort: Pulmonary effort is normal. No respiratory distress.     Breath sounds: Wheezing present.  Abdominal:     General: There is no distension.     Palpations: Abdomen is soft.     Tenderness: There is no abdominal tenderness.  Musculoskeletal:        General: No tenderness.     Cervical back: Neck supple.     Comments: Lower extremities symmetric as compared to each other. No calf tenderness. Negative Homan's. No palpable cords.   Skin:    General: Skin is warm and dry.  Neurological:     Mental Status: She is alert.  Psychiatric:        Behavior: Behavior normal.        Thought Content: Thought content normal.     ED Results / Procedures / Treatments   Labs (all labs ordered are listed, but only abnormal results are displayed) Labs Reviewed  CBC WITH DIFFERENTIAL/PLATELET - Abnormal; Notable for the following components:      Result Value   RBC 3.79 (*)    Hemoglobin 11.8 (*)    Eosinophils Absolute 0.8 (*)    All other components within normal limits  BASIC METABOLIC PANEL - Abnormal; Notable for the following components:   GFR calc non Af Amer 59 (*)  All other components within normal limits  SARS CORONAVIRUS 2 (TAT 6-24 HRS)  BRAIN NATRIURETIC PEPTIDE    EKG EKG Interpretation  Date/Time:  Tuesday June 15 2019 16:10:38 EDT Ventricular Rate:  76 PR Interval:    QRS Duration: 133 QT Interval:  395 QTC Calculation: 445 R  Axis:   -9 Text Interpretation: Sinus rhythm Consider left atrial enlargement Left bundle branch block agree, rate normalization compared to previous, otherwise no change Confirmed by Charlesetta Shanks [id number]) on 06/16/2019 1:16:04 PM   Radiology No results found.   DG Chest 2 View  Result Date: 06/15/2019 CLINICAL DATA:  Productive cough for 1 week EXAM: CHEST - 2 VIEW COMPARISON:  08/18/2018 FINDINGS: Frontal and lateral views of the chest demonstrate hyperinflation and background interstitial prominence compatible with emphysema. No airspace disease, effusion, or pneumothorax. Cardiac silhouette is unremarkable. No acute bony abnormalities. IMPRESSION: 1. Emphysema.  No superimposed airspace disease. Electronically Signed   By: Randa Ngo M.D.   On: 06/15/2019 18:35     Procedures Procedures (including critical care time)  Medications Ordered in ED Medications  albuterol (VENTOLIN HFA) 108 (90 Base) MCG/ACT inhaler 4 puff (4 puffs Inhalation Given 06/15/19 1703)  predniSONE (DELTASONE) tablet 40 mg (40 mg Oral Given 06/15/19 1906)    ED Course  I have reviewed the triage vital signs and the nursing notes.  Pertinent labs & imaging results that were available during my care of the patient were reviewed by me and considered in my medical decision making (see chart for details).    MDM Rules/Calculators/A&P                      74 year old female with cough and dyspnea.  Likely bronchitis.  Chest x-ray without focal infiltrate.  She is afebrile.  Nontoxic.  Will place on steroids.  As needed bronchodilators.  Return precautions discussed.  Close outpatient follow-up otherwise. Final Clinical Impression(s) / ED Diagnoses Final diagnoses:  Bronchitis    Rx / DC Orders ED Discharge Orders         Ordered    predniSONE (DELTASONE) 20 MG tablet  Daily     06/15/19 1900           Virgel Manifold, MD 06/19/19 1754

## 2019-07-21 ENCOUNTER — Emergency Department (HOSPITAL_COMMUNITY): Payer: 59

## 2019-07-21 ENCOUNTER — Observation Stay (HOSPITAL_COMMUNITY)
Admission: EM | Admit: 2019-07-21 | Discharge: 2019-07-22 | Disposition: A | Discharge status: Home / Self Care | Payer: 59 | Attending: Family Medicine | Admitting: Family Medicine

## 2019-07-21 ENCOUNTER — Encounter (HOSPITAL_COMMUNITY): Payer: Self-pay | Admitting: Emergency Medicine

## 2019-07-21 ENCOUNTER — Other Ambulatory Visit: Payer: Self-pay

## 2019-07-21 DIAGNOSIS — I130 Hypertensive heart and chronic kidney disease with heart failure and stage 1 through stage 4 chronic kidney disease, or unspecified chronic kidney disease: Secondary | ICD-10-CM | POA: Insufficient documentation

## 2019-07-21 DIAGNOSIS — N183 Chronic kidney disease, stage 3 unspecified: Secondary | ICD-10-CM | POA: Diagnosis not present

## 2019-07-21 DIAGNOSIS — E039 Hypothyroidism, unspecified: Secondary | ICD-10-CM | POA: Insufficient documentation

## 2019-07-21 DIAGNOSIS — I509 Heart failure, unspecified: Secondary | ICD-10-CM | POA: Diagnosis not present

## 2019-07-21 DIAGNOSIS — F17210 Nicotine dependence, cigarettes, uncomplicated: Secondary | ICD-10-CM | POA: Diagnosis not present

## 2019-07-21 DIAGNOSIS — Z720 Tobacco use: Secondary | ICD-10-CM | POA: Diagnosis present

## 2019-07-21 DIAGNOSIS — J441 Chronic obstructive pulmonary disease with (acute) exacerbation: Principal | ICD-10-CM | POA: Diagnosis present

## 2019-07-21 DIAGNOSIS — Z20822 Contact with and (suspected) exposure to covid-19: Secondary | ICD-10-CM | POA: Diagnosis not present

## 2019-07-21 DIAGNOSIS — F17200 Nicotine dependence, unspecified, uncomplicated: Secondary | ICD-10-CM | POA: Diagnosis present

## 2019-07-21 DIAGNOSIS — I2510 Atherosclerotic heart disease of native coronary artery without angina pectoris: Secondary | ICD-10-CM | POA: Insufficient documentation

## 2019-07-21 DIAGNOSIS — I5032 Chronic diastolic (congestive) heart failure: Secondary | ICD-10-CM | POA: Diagnosis present

## 2019-07-21 LAB — CBC WITH DIFFERENTIAL/PLATELET
Abs Immature Granulocytes: 0.01 10*3/uL (ref 0.00–0.07)
Basophils Absolute: 0.1 10*3/uL (ref 0.0–0.1)
Basophils Relative: 1 %
Eosinophils Absolute: 0.3 10*3/uL (ref 0.0–0.5)
Eosinophils Relative: 6 %
HCT: 39.7 % (ref 36.0–46.0)
Hemoglobin: 12.9 g/dL (ref 12.0–15.0)
Immature Granulocytes: 0 %
Lymphocytes Relative: 23 %
Lymphs Abs: 1.2 10*3/uL (ref 0.7–4.0)
MCH: 32 pg (ref 26.0–34.0)
MCHC: 32.5 g/dL (ref 30.0–36.0)
MCV: 98.5 fL (ref 80.0–100.0)
Monocytes Absolute: 0.6 10*3/uL (ref 0.1–1.0)
Monocytes Relative: 11 %
Neutro Abs: 3 10*3/uL (ref 1.7–7.7)
Neutrophils Relative %: 59 %
Platelets: 196 10*3/uL (ref 150–400)
RBC: 4.03 MIL/uL (ref 3.87–5.11)
RDW: 12 % (ref 11.5–15.5)
WBC: 5.1 10*3/uL (ref 4.0–10.5)
nRBC: 0 % (ref 0.0–0.2)

## 2019-07-21 LAB — RESPIRATORY PANEL BY RT PCR (FLU A&B, COVID)
Influenza A by PCR: NEGATIVE
Influenza B by PCR: NEGATIVE
SARS Coronavirus 2 by RT PCR: NEGATIVE

## 2019-07-21 LAB — HEMOGLOBIN A1C
Hgb A1c MFr Bld: 5.7 % — ABNORMAL HIGH (ref 4.8–5.6)
Mean Plasma Glucose: 116.89 mg/dL

## 2019-07-21 LAB — BASIC METABOLIC PANEL
Anion gap: 6 (ref 5–15)
BUN: 15 mg/dL (ref 8–23)
CO2: 29 mmol/L (ref 22–32)
Calcium: 9.3 mg/dL (ref 8.9–10.3)
Chloride: 101 mmol/L (ref 98–111)
Creatinine, Ser: 1.12 mg/dL — ABNORMAL HIGH (ref 0.44–1.00)
GFR calc Af Amer: 55 mL/min — ABNORMAL LOW (ref >60)
GFR calc non Af Amer: 48 mL/min — ABNORMAL LOW (ref >60)
Glucose, Bld: 127 mg/dL — ABNORMAL HIGH (ref 70–99)
Potassium: 3.5 mmol/L (ref 3.5–5.1)
Sodium: 136 mmol/L (ref 135–145)

## 2019-07-21 LAB — GLUCOSE, CAPILLARY
Glucose-Capillary: 139 mg/dL — ABNORMAL HIGH (ref 70–99)
Glucose-Capillary: 216 mg/dL — ABNORMAL HIGH (ref 70–99)

## 2019-07-21 LAB — POC SARS CORONAVIRUS 2 AG -  ED: SARS Coronavirus 2 Ag: NEGATIVE

## 2019-07-21 MED ORDER — TRAZODONE HCL 50 MG PO TABS: 50.0000 mg | ORAL_TABLET | Freq: Every evening | ORAL | Status: DC | PRN

## 2019-07-21 MED ORDER — MOMETASONE FURO-FORMOTEROL FUM 200-5 MCG/ACT IN AERO
2.0000 | INHALATION_SPRAY | Freq: Two times a day (BID) | RESPIRATORY_TRACT | Status: DC
  Administered 2019-07-21 – 2019-07-22 (×2): 2 via RESPIRATORY_TRACT
  Filled 2019-07-21: qty 8.8

## 2019-07-21 MED ORDER — GABAPENTIN 300 MG PO CAPS
300.0000 mg | ORAL_CAPSULE | Freq: Three times a day (TID) | ORAL | Status: DC
  Administered 2019-07-21 – 2019-07-22 (×4): 300 mg via ORAL
  Filled 2019-07-21 (×4): qty 1

## 2019-07-21 MED ORDER — AZITHROMYCIN 250 MG PO TABS
500.0000 mg | ORAL_TABLET | Freq: Every day | ORAL | Status: DC
  Administered 2019-07-21 – 2019-07-22 (×2): 500 mg via ORAL
  Filled 2019-07-21 (×2): qty 2

## 2019-07-21 MED ORDER — MONTELUKAST SODIUM 10 MG PO TABS
10.0000 mg | ORAL_TABLET | Freq: Every day | ORAL | Status: DC
  Administered 2019-07-21: 10 mg via ORAL
  Filled 2019-07-21: qty 1

## 2019-07-21 MED ORDER — INSULIN ASPART 100 UNIT/ML ~~LOC~~ SOLN: 0.0000 [IU] | Freq: Three times a day (TID) | SUBCUTANEOUS | Status: DC

## 2019-07-21 MED ORDER — SODIUM CHLORIDE 0.9% FLUSH
3.0000 mL | Freq: Two times a day (BID) | INTRAVENOUS | Status: DC
  Administered 2019-07-21 – 2019-07-22 (×2): 3 mL via INTRAVENOUS

## 2019-07-21 MED ORDER — CARVEDILOL 3.125 MG PO TABS
3.1250 mg | ORAL_TABLET | Freq: Two times a day (BID) | ORAL | Status: DC
  Administered 2019-07-21 – 2019-07-22 (×2): 3.125 mg via ORAL
  Filled 2019-07-21 (×2): qty 1

## 2019-07-21 MED ORDER — PANTOPRAZOLE SODIUM 40 MG PO TBEC
40.0000 mg | DELAYED_RELEASE_TABLET | Freq: Every day | ORAL | Status: DC
  Administered 2019-07-21 – 2019-07-22 (×2): 40 mg via ORAL
  Filled 2019-07-21 (×2): qty 1

## 2019-07-21 MED ORDER — ALBUTEROL SULFATE HFA 108 (90 BASE) MCG/ACT IN AERS
4.0000 | INHALATION_SPRAY | Freq: Once | RESPIRATORY_TRACT | Status: AC
  Administered 2019-07-21: 4 via RESPIRATORY_TRACT
  Filled 2019-07-21: qty 6.7

## 2019-07-21 MED ORDER — SODIUM CHLORIDE 0.9% FLUSH: 3.0000 mL | INTRAVENOUS | Status: DC | PRN

## 2019-07-21 MED ORDER — INSULIN ASPART 100 UNIT/ML ~~LOC~~ SOLN: 0.0000 [IU] | Freq: Every day | SUBCUTANEOUS | Status: DC

## 2019-07-21 MED ORDER — POLYETHYLENE GLYCOL 3350 17 G PO PACK: 17.0000 g | PACK | Freq: Every day | ORAL | Status: DC | PRN

## 2019-07-21 MED ORDER — FUROSEMIDE 20 MG PO TABS
20.0000 mg | ORAL_TABLET | Freq: Every day | ORAL | Status: DC
  Administered 2019-07-21 – 2019-07-22 (×2): 20 mg via ORAL
  Filled 2019-07-21 (×2): qty 1

## 2019-07-21 MED ORDER — IPRATROPIUM-ALBUTEROL 0.5-2.5 (3) MG/3ML IN SOLN
3.0000 mL | Freq: Two times a day (BID) | RESPIRATORY_TRACT | Status: DC
  Administered 2019-07-22: 3 mL via RESPIRATORY_TRACT
  Filled 2019-07-21: qty 3

## 2019-07-21 MED ORDER — ASPIRIN EC 81 MG PO TBEC
81.0000 mg | DELAYED_RELEASE_TABLET | Freq: Every day | ORAL | Status: DC
  Administered 2019-07-21 – 2019-07-22 (×2): 81 mg via ORAL
  Filled 2019-07-21 (×2): qty 1

## 2019-07-21 MED ORDER — SERTRALINE HCL 50 MG PO TABS
50.0000 mg | ORAL_TABLET | Freq: Every morning | ORAL | Status: DC
  Administered 2019-07-21 – 2019-07-22 (×2): 50 mg via ORAL
  Filled 2019-07-21 (×2): qty 1

## 2019-07-21 MED ORDER — TRAZODONE HCL 50 MG PO TABS
300.0000 mg | ORAL_TABLET | Freq: Every day | ORAL | Status: DC
  Administered 2019-07-21: 300 mg via ORAL
  Filled 2019-07-21: qty 6

## 2019-07-21 MED ORDER — ALBUTEROL SULFATE (2.5 MG/3ML) 0.083% IN NEBU: 2.5000 mg | INHALATION_SOLUTION | RESPIRATORY_TRACT | Status: DC | PRN

## 2019-07-21 MED ORDER — METHYLPREDNISOLONE SODIUM SUCC 40 MG IJ SOLR
40.0000 mg | Freq: Four times a day (QID) | INTRAMUSCULAR | Status: DC
  Administered 2019-07-21: 40 mg via INTRAVENOUS
  Filled 2019-07-21: qty 1

## 2019-07-21 MED ORDER — OXYBUTYNIN CHLORIDE 5 MG PO TABS
5.0000 mg | ORAL_TABLET | Freq: Three times a day (TID) | ORAL | Status: DC
  Administered 2019-07-21 – 2019-07-22 (×4): 5 mg via ORAL
  Filled 2019-07-21 (×4): qty 1

## 2019-07-21 MED ORDER — SODIUM CHLORIDE 0.9 % IV SOLN: 250.0000 mL | INTRAVENOUS | Status: DC | PRN

## 2019-07-21 MED ORDER — POTASSIUM CHLORIDE CRYS ER 20 MEQ PO TBCR
20.0000 meq | EXTENDED_RELEASE_TABLET | Freq: Every morning | ORAL | Status: DC
  Administered 2019-07-21 – 2019-07-22 (×2): 20 meq via ORAL
  Filled 2019-07-21 (×2): qty 1

## 2019-07-21 MED ORDER — LEVOTHYROXINE SODIUM 137 MCG PO TABS
137.0000 ug | ORAL_TABLET | Freq: Every day | ORAL | Status: DC
  Administered 2019-07-22: 137 ug via ORAL
  Filled 2019-07-21: qty 1

## 2019-07-21 MED ORDER — ACETAMINOPHEN 325 MG PO TABS
650.0000 mg | ORAL_TABLET | Freq: Four times a day (QID) | ORAL | Status: DC | PRN
  Administered 2019-07-21 – 2019-07-22 (×3): 650 mg via ORAL
  Filled 2019-07-21 (×2): qty 2

## 2019-07-21 MED ORDER — IPRATROPIUM-ALBUTEROL 0.5-2.5 (3) MG/3ML IN SOLN
3.0000 mL | Freq: Four times a day (QID) | RESPIRATORY_TRACT | Status: DC
  Administered 2019-07-21 (×3): 3 mL via RESPIRATORY_TRACT
  Filled 2019-07-21 (×3): qty 3

## 2019-07-21 MED ORDER — HEPARIN SODIUM (PORCINE) 5000 UNIT/ML IJ SOLN
5000.0000 [IU] | Freq: Three times a day (TID) | INTRAMUSCULAR | Status: DC
  Administered 2019-07-21 – 2019-07-22 (×2): 5000 [IU] via SUBCUTANEOUS
  Filled 2019-07-21 (×3): qty 1

## 2019-07-21 MED ORDER — PRAVASTATIN SODIUM 40 MG PO TABS
40.0000 mg | ORAL_TABLET | Freq: Every morning | ORAL | Status: DC
  Administered 2019-07-21 – 2019-07-22 (×2): 40 mg via ORAL
  Filled 2019-07-21 (×2): qty 1

## 2019-07-21 MED ORDER — GUAIFENESIN ER 600 MG PO TB12
600.0000 mg | ORAL_TABLET | Freq: Two times a day (BID) | ORAL | Status: DC
  Administered 2019-07-21 – 2019-07-22 (×3): 600 mg via ORAL
  Filled 2019-07-21 (×3): qty 1

## 2019-07-21 MED ORDER — LORATADINE 10 MG PO TABS
10.0000 mg | ORAL_TABLET | Freq: Every day | ORAL | Status: DC
  Administered 2019-07-21 – 2019-07-22 (×2): 10 mg via ORAL
  Filled 2019-07-21 (×2): qty 1

## 2019-07-21 MED ORDER — METHYLPREDNISOLONE SODIUM SUCC 40 MG IJ SOLR
40.0000 mg | Freq: Four times a day (QID) | INTRAMUSCULAR | Status: DC
  Administered 2019-07-22 (×3): 40 mg via INTRAVENOUS
  Filled 2019-07-21 (×3): qty 1

## 2019-07-21 MED ORDER — VITAMIN D 25 MCG (1000 UNIT) PO TABS
5000.0000 [IU] | ORAL_TABLET | Freq: Every morning | ORAL | Status: DC
  Administered 2019-07-21: 5000 [IU] via ORAL
  Filled 2019-07-21: qty 5

## 2019-07-21 MED ORDER — METHYLPREDNISOLONE SODIUM SUCC 125 MG IJ SOLR
125.0000 mg | Freq: Once | INTRAMUSCULAR | Status: AC
  Administered 2019-07-21: 125 mg via INTRAVENOUS
  Filled 2019-07-21: qty 2

## 2019-07-21 MED ORDER — ONDANSETRON HCL 4 MG PO TABS: 4.0000 mg | ORAL_TABLET | Freq: Four times a day (QID) | ORAL | Status: DC | PRN

## 2019-07-21 MED ORDER — ONDANSETRON HCL 4 MG/2ML IJ SOLN: 4.0000 mg | Freq: Four times a day (QID) | INTRAMUSCULAR | Status: DC | PRN

## 2019-07-21 NOTE — ED Notes (Signed)
Report attempted x 1

## 2019-07-21 NOTE — H&P (Addendum)
Patient Demographics:    Connie Ponce, is a 74 y.o. female  MRN: [id number]   DOB - 12-17-1945  Admit Date - 07/21/2019  Outpatient Primary MD for the patient is [name], NP   Assessment & Plan:    Principal Problem:   COPD with acute exacerbation ([place]) Active Problems:   Chronic heart failure with preserved ejection fraction (HFpEF) /diastolic dysfunction/EF 35%   Tobacco abuse   Nicotine dependence    1)Acute COPD Exacerbation- no definite pneumonia,  treat empirically with IV Solu-Medrol 40 mg every 6 hours, give mucolytics, azithromycin and bronchodilators as ordered, supplemental oxygen as ordered.   2)Acute hypoxic respiratory failure secondary to #1 above--continue supplemental oxygen, treat as above #1  3)HFpEF--- history of chronic diastolic dysfunction CHF, appears currently euvolemic/compensated, -Continue Lasix, hold Maxide  4)CKD stage - IIIa-   creatinine on admission= 1.1  , which is close to patient's baseline,,  -renally adjust medications, avoid nephrotoxic agents / dehydration  / hypotension  5)HTN--stable, continue Coreg, hold Maxzide, continue Lasix  6) hypothyroidism--- stable, continue levothyroxine  7)Tobacco Abuse- smoking cessation advised    With History of - Reviewed by me  Past Medical History:  Diagnosis Date  . Anxiety   . Carpal tunnel syndrome   . COPD (chronic obstructive pulmonary disease) ([place])   . Coronary artery disease   . Gastric ulcer   . GERD (gastroesophageal reflux disease)   . Hyperglycemia   . Hyperlipidemia   . Hypertension   . Hypothyroidism   . Osteoarthritis   . Peptic ulcer disease   . Pneumonia 2010  . Renal insufficiency   . 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 Stroke ([place]) 1990s   mild  . TIA (transient ischemic attack)        Past Surgical History:  Procedure Laterality Date  . ABDOMINAL HYSTERECTOMY    . APPENDECTOMY    . BACK SURGERY    . BONE MARROW ASPIRATION Left 04/22/14  . BONE MARROW BIOPSY Left 04/22/14  . CHOLECYSTECTOMY    . COLONOSCOPY     ?2005, [place]  . COLONOSCOPY N/A 04/07/2015   KKX:FGHWEX  . COLONOSCOPY WITH ESOPHAGOGASTRODUODENOSCOPY (EGD)  02/11/2012   RMR: Ulcerative/erosive reflux esophagitis, benign appearing gastric ulcer with unremarkable biopsy, no H pylori. Colonoscopy was unremarkable. Next colonoscopy in 2023  . CYSTECTOMY     cyst removed from rectal area.   . ESOPHAGOGASTRODUODENOSCOPY  01/05/2003   RMR: Normal esophagus, small hiatal hernia/ A couple of tiny antral erosions, otherwise normal stomach normal D1 and D2.   . ESOPHAGOGASTRODUODENOSCOPY N/A 08/11/2014   HBZ:JIRCVEL ulcer/HH  . ESOPHAGOGASTRODUODENOSCOPY N/A 12/01/2014   FYB:OFBPZWCHENIDP improved gastric ulcerations/p bx  . ESOPHAGOGASTRODUODENOSCOPY N/A 01/31/2016   Procedure: ESOPHAGOGASTRODUODENOSCOPY (EGD);  Surgeon: Daneil Dolin, MD;  Location[facility]O [facility];  Service: Endoscopy;  Laterality: N/A;  745  . HEMORRHOID SURGERY    . JOINT REPLACEMENT Right    knee  .  MALONEY DILATION N/A 01/31/2016   Procedure: Venia Minks DILATION;  Surgeon: Daneil Dolin, MD;  Location: [facility];  Service: Endoscopy;  Laterality: N/A;    Chief Complaint  Patient presents with  . Shortness of Breath      HPI:    Connie Ponce  is a 74 y.o. female past medical history relevant for obesity, COPD, HTN, HLD, and hypothyroidism as well as dCHF with prior EF around 70% and ongoing tobacco abuse presents to the ED with shortness of breath and cough as well as respiratory symptoms since 07/17/2019 --Cough is at times productive with scant amount of clear sputum  --no vomiting no diarrhea, no chest pains no palpitations no dizziness, -No fever  Or chills -Chest x-ray without acute findings --Had previously  completed her Covid vaccination and a COVID-19 test here is negative --Creatinine is 1.1 -CBC is unremarkable including WBC of 5.1 -Additional history obtained from patient's daughter at bedside  Review of systems:    In addition to the HPI above,   A full Review of  Systems was done, all other systems reviewed are negative except as noted above in HPI , .   Social History:  Reviewed by me    Social History   Tobacco Use  . Smoking status: Current Every Day Smoker    Packs/day: 1.00    Years: 58.00    Pack years: 58.00    Types: Cigarettes  . Smokeless tobacco: Never Used  Substance Use Topics  . Alcohol use: No    Alcohol/week: 0.0 standard drinks       Family History :  Reviewed by me    Family History  Problem Relation Age of Onset  . Other Mother        died age 21, natural causes  . Stroke Mother   . Diabetes Daughter   . Healthy Son   . Healthy Daughter   . Healthy Daughter   . Colon cancer Neg Hx   . Liver disease Neg Hx      Home Medications:   Prior to Admission medications   Medication Sig Start Date End Date Taking? Authorizing Provider  albuterol (PROVENTIL HFA;VENTOLIN HFA) 108 (90 Base) MCG/ACT inhaler Inhale 2 puffs into the lungs every 4 (four) hours as needed for wheezing or shortness of breath. 08/10/17   Noemi Chapel, MD  aspirin 81 MG EC tablet Take 81 mg by mouth every morning.     [provider]  augmented betamethasone dipropionate (DIPROLENE-AF) 0.05 % cream Apply 1 application topically daily as needed (for itching and irritation).    [provider]  budesonide-formoterol (SYMBICORT) 160-4.5 MCG/ACT inhaler Inhale 2 puffs into the lungs 2 (two) times daily as needed (shortness of breath).     [provider]  calcipotriene (DOVONOX) 0.005 % ointment Apply 1 application topically daily as needed (for itching/irritation).    [provider]  carvedilol (COREG) 3.125 MG tablet Take 1 tablet (3.125 mg  total) by mouth 2 (two) times daily with a meal for 30 days. 08/19/18 06/15/19  Manuella Ghazi, Pratik D, DO  cetirizine (ZYRTEC) 10 MG tablet Take 10 mg by mouth every morning.  12/01/18   [provider]  Cholecalciferol (VITAMIN D-3) 5000 UNITS TABS Take 1 tablet by mouth every morning.     [provider]  DEXILANT 60 MG capsule TAKE (1) CAPSULE BY MOUTH ONCE DAILY. Patient taking differently: Take 60 mg by mouth every morning.  05/22/19   Erenest Rasher,  PA-C  furosemide (LASIX) 20 MG tablet Take 0.5 tablets (10 mg total) by mouth daily for 30 days. Patient taking differently: Take 10 mg by mouth every morning.  08/20/18 06/15/19  Manuella Ghazi, Pratik D, DO  gabapentin (NEURONTIN) 300 MG capsule Take 300 mg by mouth 3 (three) times daily.  07/08/17   [provider]  levothyroxine (SYNTHROID) 137 MCG tablet Take 137 mcg by mouth daily before breakfast.    [provider]  montelukast (SINGULAIR) 10 MG tablet Take 10 mg by mouth at bedtime.  09/08/18   [provider]  oxybutynin (DITROPAN) 5 MG tablet TAKE (1) TABLET BY MOUTH THREE TIMES DAILY Patient taking differently: Take 5 mg by mouth 3 (three) times daily.  03/26/19   Florian Buff, MD  potassium chloride SA (K-DUR,KLOR-CON) 20 MEQ tablet Take 1 tablet (20 mEq total) by mouth every other day. Patient taking differently: Take 20 mEq by mouth every morning.  04/03/18   Johnson, Clanford L, MD  pravastatin (PRAVACHOL) 40 MG tablet Take 40 mg by mouth every morning.  12/01/18   [provider]  predniSONE (DELTASONE) 20 MG tablet Take 2 tablets (40 mg total) by mouth daily. 06/15/19   Virgel Manifold, MD  sertraline (ZOLOFT) 50 MG tablet Take 50 mg by mouth every morning.  12/01/18   [provider]  traZODone (DESYREL) 150 MG tablet Take 300 mg by mouth at bedtime.  10/01/17   [provider]  triamterene-hydrochlorothiazide (MAXZIDE-25) 37.5-25 MG tablet Take 1 tablet by mouth every morning.  09/08/18    [provider]     Allergies:     Allergies  Allergen Reactions  . Tetracyclines & Related Other (See Comments)    Unknown  . Shellfish-Derived Products Nausea And Vomiting     Physical Exam:   Vitals  Blood pressure (!) 141/71, pulse 73, temperature 98.2 F (36.8 C), temperature source Oral, resp. rate 18, height _0  (1.626 m), weight 82.1 kg, SpO2 96 %.  Physical Examination: General appearance - alert, conversational dyspnea  mental status - alert, oriented to person, place, and time,  Eyes - sclera anicteric Nose- [place] 2L/min Neck - supple, no JVD elevation , Chest -diminished breath sounds with scattered wheezes bilaterally Heart - S1 and S2 normal, regular  Abdomen - soft, nontender, nondistended, no masses or organomegaly Neurological - screening mental status exam normal, neck supple without rigidity, cranial nerves II through XII intact, DTR's normal and symmetric Extremities - no pedal edema noted, intact peripheral pulses  Skin - warm, dry     Data Review:    CBC Recent Labs  Lab 07/21/19 1011  WBC 5.1  HGB 12.9  HCT 39.7  PLT 196  MCV 98.5  MCH 32.0  MCHC 32.5  RDW 12.0  LYMPHSABS 1.2  MONOABS 0.6  EOSABS 0.3  BASOSABS 0.1   ------------------------------------------------------------------------------------------------------------------  Chemistries  Recent Labs  Lab 07/21/19 1011  NA 136  K 3.5  CL 101  CO2 29  GLUCOSE 127*  BUN 15  CREATININE 1.12*  CALCIUM 9.3   ------------------------------------------------------------------------------------------------------------------ estimated creatinine clearance is 44.3 mL/min (A) (by C-G formula based on SCr of 1.12 mg/dL (H)). ------------------------------------------------------------------------------------------------------------------ No results for input(s): TSH, T4TOTAL, T3FREE, THYROIDAB in the last 72 hours.  Invalid input(s): FREET3   Coagulation profile No  results for input(s): INR, PROTIME in the last 168 hours. ------------------------------------------------------------------------------------------------------------------- No results for input(s): DDIMER in the last 72 hours. -------------------------------------------------------------------------------------------------------------------  Cardiac Enzymes No results for input(s): CKMB, TROPONINI,  MYOGLOBIN in the last 168 hours.  Invalid input(s): CK ------------------------------------------------------------------------------------------------------------------    Component Value Date/Time   BNP 40.0 06/15/2019 1614     ---------------------------------------------------------------------------------------------------------------  Urinalysis    Component Value Date/Time   COLORURINE YELLOW 08/16/2018 0330   APPEARANCEUR HAZY (A) 08/16/2018 0330   LABSPEC 1.025 08/16/2018 0330   PHURINE 5.0 08/16/2018 0330   GLUCOSEU NEGATIVE 08/16/2018 0330   HGBUR NEGATIVE 08/16/2018 0330   BILIRUBINUR NEGATIVE 08/16/2018 0330   KETONESUR NEGATIVE 08/16/2018 0330   PROTEINUR NEGATIVE 08/16/2018 0330   UROBILINOGEN 0.2 09/19/2014 0105   NITRITE NEGATIVE 08/16/2018 0330   LEUKOCYTESUR LARGE (A) 08/16/2018 0330    ----------------------------------------------------------------------------------------------------------------   Imaging Results:    DG Chest Portable 1 View  Result Date: 07/21/2019 CLINICAL DATA:  Shortness of breath.  COVID test pending. EXAM: PORTABLE CHEST 1 VIEW COMPARISON:  06/15/2019 and CT chest 10/08/2017. FINDINGS: Trachea is midline. Heart size normal. Thoracic aorta is calcified. Mild accentuation of the pulmonary markings in the lung bases may be due to slight apical lordotic technique. No airspace consolidation or pleural fluid. IMPRESSION: 1. No acute findings. 2.  Aortic atherosclerosis (ICD10-I70.0). Electronically Signed   By: Lorin Picket M.D.   On:  07/21/2019 10:34    Radiological Exams on Admission: DG Chest Portable 1 View  Result Date: 07/21/2019 CLINICAL DATA:  Shortness of breath.  COVID test pending. EXAM: PORTABLE CHEST 1 VIEW COMPARISON:  06/15/2019 and CT chest 10/08/2017. FINDINGS: Trachea is midline. Heart size normal. Thoracic aorta is calcified. Mild accentuation of the pulmonary markings in the lung bases may be due to slight apical lordotic technique. No airspace consolidation or pleural fluid. IMPRESSION: 1. No acute findings. 2.  Aortic atherosclerosis (ICD10-I70.0). Electronically Signed   By: Lorin Picket M.D.   On: 07/21/2019 10:34   DVT Prophylaxis -SCD  /heparin AM Labs Ordered, also please review Full Orders  Family Communication: Admission, patients condition and plan of care including tests being ordered have been discussed with the patient and who indicate understanding and agree with the plan   Code Status - Full Code  Likely DC to  home  Condition   stable  Roxan Hockey M.D on 07/21/2019 at 5:44 PM Go to [email or web address] -  for contact info  [facility] - Office  [phone]

## 2019-07-21 NOTE — ED Provider Notes (Addendum)
[facility] EMERGENCY DEPARTMENT Provider Note   CSN: [id number] Arrival date & time: 07/21/19  4503     History Chief Complaint  Patient presents with  . Shortness of Breath    Connie Ponce is a 74 y.o. female.  HPI Patient presents with shortness of breath since Saturday.  History of COPD.  States this feels a lot like that.  She has been coughing with some mild sputum production.  No known sick contacts.  Has had both of her Covid shots.  States Covid test was done at the primary care doctor but will not be back for a couple days.  No chest pain.  No fevers.  No swelling her legs.  States she does feel fatigued.    Past Medical History:  Diagnosis Date  . Anxiety   . Carpal tunnel syndrome   . COPD (chronic obstructive pulmonary disease) ([place])   . Coronary artery disease   . Gastric ulcer   . GERD (gastroesophageal reflux disease)   . Hyperglycemia   . Hyperlipidemia   . Hypertension   . Hypothyroidism   . Osteoarthritis   . Peptic ulcer disease   . Pneumonia 2010  . Renal insufficiency   . 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 Stroke ([place]) 1990s   mild  . TIA (transient ischemic attack)     Patient Active Problem List   Diagnosis Date Noted  . Microcytic anemia 09/23/2018  . Spinal stenosis of lumbar region 08/17/2018  . AKI (acute kidney injury) ([place])   . Syncope and collapse 03/28/2018  . COPD (chronic obstructive pulmonary disease) ([place]) 03/28/2018  . Tobacco abuse 03/28/2018  . Fall at home 03/28/2018  . CKD (chronic kidney disease) stage 3, GFR 30-59 ml/min 03/28/2018  . Anemia in chronic kidney disease (CKD) 03/28/2018  . Hyperlipidemia 03/28/2018  . Acute metabolic encephalopathy 88/82/8003  . Gait instability 10/03/2017  . Dysarthria 10/03/2017  . Essential hypertension 10/03/2017  . Slurred speech   . Abdominal mass, RUQ (right upper quadrant) 03/13/2017  . Abdominal pain 03/13/2017  . RUQ pain 03/13/2017  . Dysphagia   . TIA (transient ischemic  attack) 01/03/2016  . Absolute anemia 07/03/2015  . Proctalgia   . Rectal pain 01/04/2015  . Hemorrhoids 01/04/2015  . Diarrhea 01/04/2015  . Gastric ulceration   . Gastric ulcer 11/29/2014  . Toxic metabolic encephalopathy 49/17/9150  . UTI (urinary tract infection) 09/19/2014  . ARF (acute renal failure) ([place])   . LUQ pain 07/28/2014  . Nausea with vomiting 07/28/2014  . Bilateral lower extremity edema 07/28/2014  . Thrombocytopenia ([place]) 04/12/2014  . RLQ abdominal pain 01/22/2012  . Constipation 01/22/2012  . GERD (gastroesophageal reflux disease) 01/22/2012    Past Surgical History:  Procedure Laterality Date  . ABDOMINAL HYSTERECTOMY    . APPENDECTOMY    . BACK SURGERY    . BONE MARROW ASPIRATION Left 04/22/14  . BONE MARROW BIOPSY Left 04/22/14  . CHOLECYSTECTOMY    . COLONOSCOPY     ?2005, [place]  . COLONOSCOPY N/A 04/07/2015   VWP:[id number]  . COLONOSCOPY WITH ESOPHAGOGASTRODUODENOSCOPY (EGD)  02/11/2012   RMR: Ulcerative/erosive reflux esophagitis, benign appearing gastric ulcer with unremarkable biopsy, no H pylori. Colonoscopy was unremarkable. Next colonoscopy in 2023  . CYSTECTOMY     cyst removed from rectal area.   . ESOPHAGOGASTRODUODENOSCOPY  01/05/2003   RMR: Normal esophagus, small hiatal hernia/ A couple of tiny antral erosions, otherwise normal stomach normal D1 and D2.   Marland Kitchen  ESOPHAGOGASTRODUODENOSCOPY N/A 08/11/2014   IWL:NLGXQJJ ulcer/HH  . ESOPHAGOGASTRODUODENOSCOPY N/A 12/01/2014   HER:[id number] improved gastric ulcerations/p bx  . ESOPHAGOGASTRODUODENOSCOPY N/A 01/31/2016   Procedure: ESOPHAGOGASTRODUODENOSCOPY (EGD);  Surgeon: Daneil Dolin, MD;  Location: [facility] ENDO SUITE;  Service: Endoscopy;  Laterality: N/A;  745  . HEMORRHOID SURGERY    . JOINT REPLACEMENT Right    knee  . MALONEY DILATION N/A 01/31/2016   Procedure: Venia Minks DILATION;  Surgeon: Daneil Dolin, MD;  Location: [facility] ENDO SUITE;  Service: Endoscopy;  Laterality: N/A;      OB History    Gravida  4   Para      Term      Preterm      AB      Living  3     SAB      TAB      Ectopic      Multiple      Live Births              Family History  Problem Relation Age of Onset  . Other Mother        died age 36, natural causes  . Stroke Mother   . Diabetes Daughter   . Healthy Son   . Healthy Daughter   . Healthy Daughter   . Colon cancer Neg Hx   . Liver disease Neg Hx     Social History   Tobacco Use  . Smoking status: Current Every Day Smoker    Packs/day: 1.00    Years: 58.00    Pack years: 58.00    Types: Cigarettes  . Smokeless tobacco: Never Used  Substance Use Topics  . Alcohol use: No    Alcohol/week: 0.0 standard drinks  . Drug use: No    Home Medications Prior to Admission medications   Medication Sig Start Date End Date Taking? Authorizing Provider  albuterol (PROVENTIL HFA;VENTOLIN HFA) 108 (90 Base) MCG/ACT inhaler Inhale 2 puffs into the lungs every 4 (four) hours as needed for wheezing or shortness of breath. 08/10/17   Noemi Chapel, MD  aspirin 81 MG EC tablet Take 81 mg by mouth every morning.     [provider]  augmented betamethasone dipropionate (DIPROLENE-AF) 0.05 % cream Apply 1 application topically daily as needed (for itching and irritation).    [provider]  budesonide-formoterol (SYMBICORT) 160-4.5 MCG/ACT inhaler Inhale 2 puffs into the lungs 2 (two) times daily as needed (shortness of breath).     [provider]  calcipotriene (DOVONOX) 0.005 % ointment Apply 1 application topically daily as needed (for itching/irritation).    [provider]  carvedilol (COREG) 3.125 MG tablet Take 1 tablet (3.125 mg total) by mouth 2 (two) times daily with a meal for 30 days. 08/19/18 06/15/19  Manuella Ghazi, Pratik D, DO  cetirizine (ZYRTEC) 10 MG tablet Take 10 mg by mouth every morning.  12/01/18   [provider]  Cholecalciferol (VITAMIN D-3) 5000 UNITS TABS Take 1  tablet by mouth every morning.     [provider]  DEXILANT 60 MG capsule TAKE (1) CAPSULE BY MOUTH ONCE DAILY. Patient taking differently: Take 60 mg by mouth every morning.  05/22/19   Erenest Rasher, PA-C  furosemide (LASIX) 20 MG tablet Take 0.5 tablets (10 mg total) by mouth daily for 30 days. Patient taking differently: Take 10 mg by mouth every morning.  08/20/18 06/15/19  Manuella Ghazi, Pratik D, DO  gabapentin (NEURONTIN) 300 MG capsule Take 300 mg  by mouth 3 (three) times daily.  07/08/17   [provider]  levothyroxine (SYNTHROID) 137 MCG tablet Take 137 mcg by mouth daily before breakfast.    [provider]  montelukast (SINGULAIR) 10 MG tablet Take 10 mg by mouth at bedtime.  09/08/18   [provider]  oxybutynin (DITROPAN) 5 MG tablet TAKE (1) TABLET BY MOUTH THREE TIMES DAILY Patient taking differently: Take 5 mg by mouth 3 (three) times daily.  03/26/19   Florian Buff, MD  potassium chloride SA (K-DUR,KLOR-CON) 20 MEQ tablet Take 1 tablet (20 mEq total) by mouth every other day. Patient taking differently: Take 20 mEq by mouth every morning.  04/03/18   Johnson, Clanford L, MD  pravastatin (PRAVACHOL) 40 MG tablet Take 40 mg by mouth every morning.  12/01/18   [provider]  predniSONE (DELTASONE) 20 MG tablet Take 2 tablets (40 mg total) by mouth daily. 06/15/19   Virgel Manifold, MD  sertraline (ZOLOFT) 50 MG tablet Take 50 mg by mouth every morning.  12/01/18   [provider]  traZODone (DESYREL) 150 MG tablet Take 300 mg by mouth at bedtime.  10/01/17   [provider]  triamterene-hydrochlorothiazide (MAXZIDE-25) 37.5-25 MG tablet Take 1 tablet by mouth every morning.  09/08/18   [provider]    Allergies    Tetracyclines & related and Shellfish-derived products  Review of Systems   Review of Systems  Constitutional: Positive for fatigue. Negative for fever.  HENT: Negative for congestion.   Respiratory:  Positive for cough and shortness of breath.   Cardiovascular: Negative for chest pain and leg swelling.  Genitourinary: Negative for flank pain.  Musculoskeletal: Negative for back pain.  Skin: Negative for rash.  Neurological: Negative for weakness.  Psychiatric/Behavioral: Negative for confusion.    Physical Exam Updated Vital Signs BP (!) 145/62   Pulse 71   Temp 98.4 F (36.9 C) (Oral)   Resp 15   Ht '5\' 3"'$  (1.6 m)   Wt 102.1 kg   SpO2 100%   BMI 39.86 kg/m   Physical Exam Vitals and nursing note reviewed.  HENT:     Head: Normocephalic.  Cardiovascular:     Rate and Rhythm: Normal rate and regular rhythm.  Pulmonary:     Breath sounds: Wheezing present.     Comments: Some mild wheezing and prolonged expirations. Chest:     Chest wall: No tenderness.  Abdominal:     Tenderness: There is no abdominal tenderness.  Musculoskeletal:     Right lower leg: No tenderness.     Left lower leg: No tenderness.  Skin:    General: Skin is warm.     Capillary Refill: Capillary refill takes less than 2 seconds.  Neurological:     Mental Status: She is alert and oriented to person, place, and time.     ED Results / Procedures / Treatments   Labs (all labs ordered are listed, but only abnormal results are displayed) Labs Reviewed  BASIC METABOLIC PANEL - Abnormal; Notable for the following components:      Result Value   Glucose, Bld 127 (*)    Creatinine, Ser 1.12 (*)    GFR calc non Af Amer 48 (*)    GFR calc Af Amer 55 (*)    All other components within normal limits  RESPIRATORY PANEL BY RT PCR (FLU A&B, COVID)  CBC WITH DIFFERENTIAL/PLATELET  POC SARS CORONAVIRUS 2 AG -  ED    EKG  EKG Interpretation  Date/Time:  Wednesday Jul 21 2019 09:58:01 EDT Ventricular Rate:  65 PR Interval:    QRS Duration: 140 QT Interval:  438 QTC Calculation: 456 R Axis:   35 Text Interpretation: Sinus rhythm Left bundle branch block No significant change since last tracing  Confirmed by Davonna Belling [phone]) on 07/21/2019 10:07:57 AM   Radiology DG Chest Portable 1 View  Result Date: 07/21/2019 CLINICAL DATA:  Shortness of breath.  COVID test pending. EXAM: PORTABLE CHEST 1 VIEW COMPARISON:  06/15/2019 and CT chest 10/08/2017. FINDINGS: Trachea is midline. Heart size normal. Thoracic aorta is calcified. Mild accentuation of the pulmonary markings in the lung bases may be due to slight apical lordotic technique. No airspace consolidation or pleural fluid. IMPRESSION: 1. No acute findings. 2.  Aortic atherosclerosis (ICD10-I70.0). Electronically Signed   By: Lorin Picket M.D.   On: 07/21/2019 10:34    Procedures Procedures (including critical care time)  Medications Ordered in ED Medications  albuterol (VENTOLIN HFA) 108 (90 Base) MCG/ACT inhaler 4 puff (4 puffs Inhalation Given 07/21/19 1018)  methylPREDNISolone sodium succinate (SOLU-MEDROL) 125 mg/2 mL injection 125 mg (125 mg Intravenous Given 07/21/19 1122)    ED Course  I have reviewed the triage vital signs and the nursing notes.  Pertinent labs & imaging results that were available during my care of the patient were reviewed by me and considered in my medical decision making (see chart for details).    MDM Rules/Calculators/A&P                      Patient presents with shortness of breath.  Has had for last few days.  History of COPD.  Desaturated down into the 80s on room air even after albuterol.  Does fine on nasal cannula however.  Wheezing on exam.  Negative point-of-care and PCR Covid test.  Has had Covid immunizations.  Will admit for further treatment.  Will now add nebulizers.  Has had steroids already.  CRITICAL CARE Performed by: Davonna Belling Total critical care time: 30 minutes Critical care time was exclusive of separately billable procedures and treating other patients. Critical care was necessary to treat or prevent imminent or life-threatening deterioration. Critical care was  time spent personally by me on the following activities: development of treatment plan with patient and/or surrogate as well as nursing, discussions with consultants, evaluation of patient's response to treatment, examination of patient, obtaining history from patient or surrogate, ordering and performing treatments and interventions, ordering and review of laboratory studies, ordering and review of radiographic studies, pulse oximetry and re-evaluation of patient's condition.    Final Clinical Impression(s) / ED Diagnoses Final diagnoses:  COPD exacerbation [facility])    Rx / DC Orders ED Discharge Orders    None       Davonna Belling, MD 07/21/19 1223    Davonna Belling, MD 07/21/19 [phone]

## 2019-07-21 NOTE — Plan of Care (Signed)

## 2019-07-21 NOTE — ED Notes (Signed)
Report attempted x2

## 2019-07-21 NOTE — ED Triage Notes (Signed)
Patient states SOB started late Saturday evening.  Was seen at her doctor's office this morning and a covid test preformed.  MD advised patient to go to the ED.

## 2019-07-22 DIAGNOSIS — J441 Chronic obstructive pulmonary disease with (acute) exacerbation: Secondary | ICD-10-CM | POA: Diagnosis not present

## 2019-07-22 LAB — CBC
HCT: 39.1 % (ref 36.0–46.0)
Hemoglobin: 12.9 g/dL (ref 12.0–15.0)
MCH: 32 pg (ref 26.0–34.0)
MCHC: 33 g/dL (ref 30.0–36.0)
MCV: 97 fL (ref 80.0–100.0)
Platelets: ADEQUATE 10*3/uL (ref 150–400)
RBC: 4.03 MIL/uL (ref 3.87–5.11)
RDW: 11.6 % (ref 11.5–15.5)
WBC: 6.6 10*3/uL (ref 4.0–10.5)
nRBC: 0 % (ref 0.0–0.2)

## 2019-07-22 LAB — BASIC METABOLIC PANEL
Anion gap: 12 (ref 5–15)
BUN: 13 mg/dL (ref 8–23)
CO2: 27 mmol/L (ref 22–32)
Calcium: 9.9 mg/dL (ref 8.9–10.3)
Chloride: 100 mmol/L (ref 98–111)
Creatinine, Ser: 0.87 mg/dL (ref 0.44–1.00)
GFR calc Af Amer: >60 mL/min (ref >60)
GFR calc non Af Amer: >60 mL/min (ref >60)
Glucose, Bld: 148 mg/dL — ABNORMAL HIGH (ref 70–99)
Potassium: 3.7 mmol/L (ref 3.5–5.1)
Sodium: 139 mmol/L (ref 135–145)

## 2019-07-22 LAB — GLUCOSE, CAPILLARY
Glucose-Capillary: 157 mg/dL — ABNORMAL HIGH (ref 70–99)
Glucose-Capillary: 151 mg/dL — ABNORMAL HIGH (ref 70–99)

## 2019-07-22 MED ORDER — BENZONATATE 200 MG PO CAPS: 200.0000 mg | ORAL_CAPSULE | Freq: Three times a day (TID) | ORAL | 2 refills | Status: DC | PRN

## 2019-07-22 MED ORDER — DEXILANT 60 MG PO CPDR: DELAYED_RELEASE_CAPSULE | ORAL | 5 refills | Status: DC

## 2019-07-22 MED ORDER — AZITHROMYCIN 500 MG PO TABS
500.0000 mg | ORAL_TABLET | Freq: Every day | ORAL | 0 refills | Status: AC
Start: 2019-07-22 — End: 2019-07-27

## 2019-07-22 MED ORDER — NICOTINE 14 MG/24HR TD PT24: 14.0000 mg | MEDICATED_PATCH | TRANSDERMAL | 0 refills | Status: AC

## 2019-07-22 MED ORDER — ASPIRIN 81 MG PO TBEC: 81.0000 mg | DELAYED_RELEASE_TABLET | Freq: Every day | ORAL | 4 refills | Status: DC

## 2019-07-22 MED ORDER — ALBUTEROL SULFATE HFA 108 (90 BASE) MCG/ACT IN AERS
2.0000 | INHALATION_SPRAY | RESPIRATORY_TRACT | 3 refills | Status: DC | PRN
Start: 2019-07-22 — End: 2020-07-13

## 2019-07-22 MED ORDER — POTASSIUM CHLORIDE CRYS ER 20 MEQ PO TBCR: 20.0000 meq | EXTENDED_RELEASE_TABLET | ORAL | 2 refills | Status: DC

## 2019-07-22 MED ORDER — NICOTINE 21 MG/24HR TD PT24
21.0000 mg | MEDICATED_PATCH | Freq: Once | TRANSDERMAL | Status: DC
  Administered 2019-07-22: 21 mg via TRANSDERMAL
  Filled 2019-07-22: qty 1

## 2019-07-22 MED ORDER — ACETAMINOPHEN 325 MG PO TABS: 650.0000 mg | ORAL_TABLET | Freq: Four times a day (QID) | ORAL | 0 refills | Status: DC | PRN

## 2019-07-22 MED ORDER — CETIRIZINE HCL 10 MG PO TABS: 10.0000 mg | ORAL_TABLET | Freq: Every morning | ORAL | 2 refills | Status: DC

## 2019-07-22 MED ORDER — GUAIFENESIN ER 600 MG PO TB12: 600.0000 mg | ORAL_TABLET | Freq: Two times a day (BID) | ORAL | 0 refills | Status: DC

## 2019-07-22 MED ORDER — DULERA 100-5 MCG/ACT IN AERO: 2.0000 | INHALATION_SPRAY | Freq: Two times a day (BID) | RESPIRATORY_TRACT | 3 refills | Status: DC

## 2019-07-22 MED ORDER — PREDNISONE 20 MG PO TABS
40.0000 mg | ORAL_TABLET | Freq: Every day | ORAL | 0 refills | Status: AC
Start: 2019-07-22 — End: 2019-07-27

## 2019-07-22 MED ORDER — MORPHINE SULFATE (PF) 2 MG/ML IV SOLN
1.0000 mg | Freq: Once | INTRAVENOUS | Status: AC
  Administered 2019-07-22: 1 mg via INTRAVENOUS
  Filled 2019-07-22: qty 1

## 2019-07-22 MED ORDER — OXYCODONE HCL 5 MG PO TABS
10.0000 mg | ORAL_TABLET | Freq: Once | ORAL | Status: AC
  Administered 2019-07-22: 10 mg via ORAL
  Filled 2019-07-22: qty 2

## 2019-07-22 MED ORDER — MONTELUKAST SODIUM 10 MG PO TABS: 10.0000 mg | ORAL_TABLET | Freq: Every day | ORAL | 5 refills | Status: DC

## 2019-07-22 MED ORDER — FUROSEMIDE 20 MG PO TABS: 20.0000 mg | ORAL_TABLET | Freq: Every morning | ORAL | 3 refills | Status: DC

## 2019-07-22 MED ORDER — CARVEDILOL 6.25 MG PO TABS: 6.2500 mg | ORAL_TABLET | Freq: Two times a day (BID) | ORAL | 3 refills | Status: DC

## 2019-07-22 NOTE — Progress Notes (Signed)
Patient ambulated on RA. O2 saturation 100%.

## 2019-07-22 NOTE — Progress Notes (Signed)
Patient refuses insulin.  Provider notified.

## 2019-07-22 NOTE — Discharge Summary (Signed)
Connie Ponce, is a 74 y.o. female  DOB 1945-12-13  MRN [id number].  Admission date:  07/21/2019  Admitting Physician  Roxan Hockey, MD  Discharge Date:  07/22/2019   Primary MD  [name], NP  Recommendations for primary care physician for things to follow:   1)You are strongly advised to quit smoking--you may use Nicotine patch to help you quit smoking 2)Please Stop Maxzide (Triamterene/Hctz)------ 3)Take Lasix (Furosemide) 20 mg every Morning 4)Take Potassium 20 meq every Tuesday/Thursday and Saturday  Admission Diagnosis  COPD exacerbation ([place]) [J44.1] COPD with acute exacerbation ([place]) [J44.1]  Discharge Diagnosis  COPD exacerbation ([place]) [J44.1] COPD with acute exacerbation ([place]) [J44.1]    Principal Problem:   COPD with acute exacerbation ([place]) Active Problems:   Chronic heart failure with preserved ejection fraction (HFpEF) /diastolic dysfunction/EF 43%   Tobacco abuse   Nicotine dependence     Past Medical History:  Diagnosis Date  . Anxiety   . Carpal tunnel syndrome   . COPD (chronic obstructive pulmonary disease) ([place])   . Coronary artery disease   . Gastric ulcer   . GERD (gastroesophageal reflux disease)   . Hyperglycemia   . Hyperlipidemia   . Hypertension   . Hypothyroidism   . Osteoarthritis   . Peptic ulcer disease   . Pneumonia 2010  . Renal insufficiency   . 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 Stroke ([place]) 1990s   mild  . TIA (transient ischemic attack)     Past Surgical History:  Procedure Laterality Date  . ABDOMINAL HYSTERECTOMY    . APPENDECTOMY    . BACK SURGERY    . BONE MARROW ASPIRATION Left 04/22/14  . BONE MARROW BIOPSY Left 04/22/14  . CHOLECYSTECTOMY    . COLONOSCOPY     ?2005, [place]  . COLONOSCOPY N/A 04/07/2015   PIR:JJOACZ  . COLONOSCOPY WITH ESOPHAGOGASTRODUODENOSCOPY (EGD)  02/11/2012   RMR: Ulcerative/erosive reflux  esophagitis, benign appearing gastric ulcer with unremarkable biopsy, no H pylori. Colonoscopy was unremarkable. Next colonoscopy in 2023  . CYSTECTOMY     cyst removed from rectal area.   . ESOPHAGOGASTRODUODENOSCOPY  01/05/2003   RMR: Normal esophagus, small hiatal hernia/ A couple of tiny antral erosions, otherwise normal stomach normal D1 and D2.   . ESOPHAGOGASTRODUODENOSCOPY N/A 08/11/2014   YSA:[id number] ulcer/HH  . ESOPHAGOGASTRODUODENOSCOPY N/A 12/01/2014   UXN:[id number] improved gastric ulcerations/p bx  . ESOPHAGOGASTRODUODENOSCOPY N/A 01/31/2016   Procedure: ESOPHAGOGASTRODUODENOSCOPY (EGD);  Surgeon: Daneil Dolin, MD;  [facility];  Service: Endoscopy;  Laterality: N/A;  745  . HEMORRHOID SURGERY    . JOINT REPLACEMENT Right    knee  . MALONEY DILATION N/A 01/31/2016   Procedure: Venia Minks DILATION;  Surgeon: Daneil Dolin, MD;  Location: [facility];  Service: Endoscopy;  Laterality: N/A;       HPI  from the history and physical done on the day of admission:    Connie Ponce  is a 74 y.o. female past medical history relevant for  obesity, COPD, HTN, HLD, and hypothyroidism as well as dCHF with prior EF around 70% and ongoing tobacco abuse presents to the ED with shortness of breath and cough as well as respiratory symptoms since 07/17/2019 --Cough is at times productive with scant amount of clear sputum  --no vomiting no diarrhea, no chest pains no palpitations no dizziness, -No fever  Or chills -Chest x-ray without acute findings --Had previously completed her Covid vaccination and a COVID-19 test here is negative --Creatinine is 1.1 -CBC is unremarkable including WBC of 5.1 -Additional history obtained from patient's daughter at bedside   Hospital Course:    1)Acute COPD Exacerbation- no definite pneumonia,  improved with IV Solu-Medrol,  -c/n mucolytics and azithromycin and bronchodilators -DC home on prednisone -Hypoxia resolved, post ambulation O2  sats 98 to 100%  2)Acute hypoxic respiratory failure secondary to #1 above-- initially required supplemental oxygen --Hypoxia resolved, post ambulation O2 sats 98 to 100%  3)HFpEF--- history of chronic diastolic dysfunction CHF, appears currently euvolemic/compensated, -Continue Lasix, stop Maxide -Coreg increased to 6.25 mg twice daily  4)CKD stage - IIIa-   creatinine on admission= 1.1  , which is close to patient's baseline,,  repeat creatinine down to 0.87 today -renally adjust medications, avoid nephrotoxic agents / dehydration  / hypotension  5)HTN--stable,  increase Coreg to 6.25 mg twice daily, stop Maxzide, continue Lasix 20 mg daily  6) hypothyroidism--- stable, continue levothyroxine  7)Tobacco Abuse- smoking cessation advised, may use OTC nicotine patch  Discharge Condition: stable  Follow UP   Consults obtained - na  Diet and Activity recommendation:  As advised  Discharge Instructions    Discharge Instructions    Call MD for:  difficulty breathing, headache or visual disturbances   Complete by: As directed    Call MD for:  extreme fatigue   Complete by: As directed    Call MD for:  persistant dizziness or light-headedness   Complete by: As directed    Call MD for:  persistant nausea and vomiting   Complete by: As directed    Call MD for:  severe uncontrolled pain   Complete by: As directed    Call MD for:  temperature >100.4   Complete by: As directed    Diet - low sodium heart healthy   Complete by: As directed    Discharge instructions   Complete by: As directed    1)You are strongly advised to quit smoking--you may use Nicotine patch to help you quit smoking 2)Please Stop Maxzide (Triamterene/Hctz)------ 3)Take Lasix (Furosemide) 20 mg every Morning 4)Take Potassium 20 meq every Tuesday/Thursday and Saturday   Increase activity slowly   Complete by: As directed         Discharge Medications     Allergies as of 07/22/2019      Reactions    Tetracyclines & Related Other (See Comments)   Unknown   Shellfish-derived Products Nausea And Vomiting      Medication List    STOP taking these medications   budesonide-formoterol 160-4.5 MCG/ACT inhaler Commonly known as: SYMBICORT   triamterene-hydrochlorothiazide 37.5-25 MG tablet Commonly known as: MAXZIDE-25     TAKE these medications   acetaminophen 325 MG tablet Commonly known as: TYLENOL Take 2 tablets (650 mg total) by mouth every 6 (six) hours as needed for mild pain, fever or headache.   albuterol 108 (90 Base) MCG/ACT inhaler Commonly known as: VENTOLIN HFA Inhale 2 puffs into the lungs every 4 (four) hours as needed for wheezing or shortness  of breath.   aspirin 81 MG EC tablet Take 1 tablet (81 mg total) by mouth daily with breakfast. Start taking on: Jul 23, 2019 What changed: when to take this   augmented betamethasone dipropionate 0.05 % cream Commonly known as: DIPROLENE-AF Apply 1 application topically daily as needed (for itching and irritation).   azithromycin 500 MG tablet Commonly known as: ZITHROMAX Take 1 tablet (500 mg total) by mouth daily for 5 days.   benzonatate 200 MG capsule Commonly known as: TESSALON Take 1 capsule (200 mg total) by mouth 3 (three) times daily as needed for cough. What changed:   medication strength  how much to take  when to take this  reasons to take this   calcipotriene 0.005 % ointment Commonly known as: DOVONOX Apply 1 application topically daily as needed (for itching/irritation).   carvedilol 6.25 MG tablet Commonly known as: COREG Take 1 tablet (6.25 mg total) by mouth 2 (two) times daily with a meal. What changed:   medication strength  how much to take   cetirizine 10 MG tablet Commonly known as: ZYRTEC Take 1 tablet (10 mg total) by mouth every morning.   Dexilant 60 MG capsule Generic drug: dexlansoprazole TAKE (1) CAPSULE BY MOUTH ONCE DAILY. What changed: See the new instructions.    diltiazem 360 MG 24 hr capsule Commonly known as: TIAZAC Take 360 mg by mouth daily.   Dulera 100-5 MCG/ACT Aero Generic drug: mometasone-formoterol Inhale 2 puffs into the lungs 2 (two) times daily.   furosemide 20 MG tablet Commonly known as: LASIX Take 1 tablet (20 mg total) by mouth in the morning. What changed:   how much to take  when to take this   gabapentin 300 MG capsule Commonly known as: NEURONTIN Take 300 mg by mouth 3 (three) times daily.   guaiFENesin 600 MG 12 hr tablet Commonly known as: MUCINEX Take 1 tablet (600 mg total) by mouth 2 (two) times daily.   levothyroxine 137 MCG tablet Commonly known as: SYNTHROID Take 137 mcg by mouth daily before breakfast.   montelukast 10 MG tablet Commonly known as: SINGULAIR Take 1 tablet (10 mg total) by mouth at bedtime.   nicotine 14 mg/24hr patch Commonly known as: NICODERM CQ - dosed in mg/24 hours Place 1 patch (14 mg total) onto the skin daily for 28 days.   oxybutynin 5 MG tablet Commonly known as: DITROPAN TAKE (1) TABLET BY MOUTH THREE TIMES DAILY What changed: See the new instructions.   oxyCODONE-acetaminophen 5-325 MG tablet Commonly known as: PERCOCET/ROXICET Take 2 tablets by mouth daily as needed for moderate pain or severe pain.   potassium chloride SA 20 MEQ tablet Commonly known as: KLOR-CON Take 1 tablet (20 mEq total) by mouth every Tuesday, Thursday, Saturday, and Sunday. What changed: when to take this   pravastatin 40 MG tablet Commonly known as: PRAVACHOL Take 40 mg by mouth every morning.   predniSONE 20 MG tablet Commonly known as: DELTASONE Take 2 tablets (40 mg total) by mouth daily with breakfast for 5 days. What changed: when to take this   sertraline 50 MG tablet Commonly known as: ZOLOFT Take 50 mg by mouth every morning.   traZODone 150 MG tablet Commonly known as: DESYREL Take 300 mg by mouth at bedtime.   Vitamin D-3 125 MCG (5000 UT) Tabs Take 1 tablet by  mouth every morning.       Major procedures and Radiology Reports - PLEASE review detailed and final reports for  all details, in brief -   DG Chest Portable 1 View  Result Date: 07/21/2019 CLINICAL DATA:  Shortness of breath.  COVID test pending. EXAM: PORTABLE CHEST 1 VIEW COMPARISON:  06/15/2019 and CT chest 10/08/2017. FINDINGS: Trachea is midline. Heart size normal. Thoracic aorta is calcified. Mild accentuation of the pulmonary markings in the lung bases may be due to slight apical lordotic technique. No airspace consolidation or pleural fluid. IMPRESSION: 1. No acute findings. 2.  Aortic atherosclerosis (ICD10-I70.0). Electronically Signed   By: Lorin Picket M.D.   On: 07/21/2019 10:34    Micro Results   Recent Results (from the past 240 hour(s))  Respiratory Panel by RT PCR (Flu A&B, Covid) - Nasopharyngeal Swab     Status: None   Collection Time: 07/21/19 11:16 AM   Specimen: Nasopharyngeal Swab  Result Value Ref Range Status   SARS Coronavirus 2 by RT PCR NEGATIVE NEGATIVE Final    Comment: (NOTE) SARS-CoV-2 target nucleic acids are NOT DETECTED. The SARS-CoV-2 RNA is generally detectable in upper respiratoy specimens during the acute phase of infection. The lowest concentration of SARS-CoV-2 viral copies this assay can detect is 131 copies/mL. A negative result does not preclude SARS-Cov-2 infection and should not be used as the sole basis for treatment or other patient management decisions. A negative result may occur with  improper specimen collection/handling, submission of specimen other than nasopharyngeal swab, presence of viral mutation(s) within the areas targeted by this assay, and inadequate number of viral copies (<131 copies/mL). A negative result must be combined with clinical observations, patient history, and epidemiological information. The expected result is Negative. Fact Sheet for Patients:  PinkCheek.be Fact Sheet for  Healthcare Providers:  GravelBags.it This test is not yet ap proved or cleared by the [place] FDA and  has been authorized for detection and/or diagnosis of SARS-CoV-2 by FDA under an Emergency Use Authorization (EUA). This EUA will remain  in effect (meaning this test can be used) for the duration of the COVID-19 declaration under Section 564(b)(1) of the Act, 21 U.S.C. section 360bbb-3(b)(1), unless the authorization is terminated or revoked sooner.    Influenza A by PCR NEGATIVE NEGATIVE Final   Influenza B by PCR NEGATIVE NEGATIVE Final    Comment: (NOTE) The Xpert Xpress SARS-CoV-2/FLU/RSV assay is intended as an aid in  the diagnosis of influenza from Nasopharyngeal swab specimens and  should not be used as a sole basis for treatment. Nasal washings and  aspirates are unacceptable for Xpert Xpress SARS-CoV-2/FLU/RSV  testing. Fact Sheet for Patients: PinkCheek.be Fact Sheet for Healthcare Providers: GravelBags.it This test is not yet approved or cleared by the [place] FDA and  has been authorized for detection and/or diagnosis of SARS-CoV-2 by  FDA under an Emergency Use Authorization (EUA). This EUA will remain  in effect (meaning this test can be used) for the duration of the  Covid-19 declaration under Section 564(b)(1) of the Act, 21  U.S.C. section 360bbb-3(b)(1), unless the authorization is  terminated or revoked. Performed at [facility], [address], [place],  [address]        Today   Subjective    Adama Ricardo today has no new complaints, -Cough and shortness of breath improving, -Hypoxia resolved, O2 sats up to 98 200% on room air post ambulation          Patient has been seen and examined prior to discharge   Objective   Blood pressure (!) 149/68, pulse 70, temperature 98.2 F (  36.8 C), temperature source Oral, resp. rate 18, height _0   (1.626 m), weight 82.3 kg, SpO2 95 %.   Intake/Output Summary (Last 24 hours) at 07/22/2019 1227 Last data filed at 07/22/2019 1222 Gross per 24 hour  Intake 243 ml  Output --  Net 243 ml   Exam Gen:- Awake Alert, no acute distress, speaking in complete sentences HEENT:- [place].AT, No sclera icterus Neck-Supple Neck,No JVD,.  Lungs-improved air movement, no wheezing CV- S1, S2 normal, regular Abd-  +ve B.Sounds, Abd Soft, No tenderness,    Extremity/Skin:- No  edema,   good pulses Psych-affect is appropriate, oriented x3 Neuro-no new focal deficits, no tremors    Data Review   CBC w Diff:  Lab Results  Component Value Date   WBC 6.6 07/22/2019   HGB 12.9 07/22/2019   HCT 39.1 07/22/2019   PLT  07/22/2019    PLATELET CLUMPS NOTED ON SMEAR, COUNT APPEARS ADEQUATE   LYMPHOPCT 23 07/21/2019   MONOPCT 11 07/21/2019   EOSPCT 6 07/21/2019   BASOPCT 1 07/21/2019    CMP:  Lab Results  Component Value Date   NA 139 07/22/2019   NA 140 01/17/2012   K 3.7 07/22/2019   K 3.7 01/17/2012   CL 100 07/22/2019   CO2 27 07/22/2019   BUN 13 07/22/2019   BUN 17 01/17/2012   CREATININE 0.87 07/22/2019   CREATININE 1.37 (H) 03/13/2017   PROT 6.5 06/04/2019   ALBUMIN 3.5 06/04/2019   ALBUMIN 3.9 01/17/2012   BILITOT 0.4 06/04/2019   BILITOT 0.2 01/17/2012   ALKPHOS 78 06/04/2019   ALKPHOS 92 01/17/2012   Ponce 16 06/04/2019   Ponce 12 01/17/2012   ALT 20 06/04/2019  .   Total Discharge time is about 33 minutes  Roxan Hockey M.D on 07/22/2019 at 12:27 PM  Go to [email or web address] -  for contact info  [facility] - Office  [phone]

## 2019-07-22 NOTE — Progress Notes (Signed)
Nsg Discharge Note  Admit Date:  07/21/2019 Discharge date: 07/22/2019   Connie Ponce to be D/C'd home per MD order.  AVS completed.  Copy for chart, and copy for patient signed, and dated. Patient/caregiver able to verbalize understanding.  Discharge Medication: Allergies as of 07/22/2019      Reactions   Tetracyclines & Related Other (See Comments)   Unknown   Shellfish-derived Products Nausea And Vomiting      Medication List    STOP taking these medications   budesonide-formoterol 160-4.5 MCG/ACT inhaler Commonly known as: SYMBICORT   triamterene-hydrochlorothiazide 37.5-25 MG tablet Commonly known as: MAXZIDE-25     TAKE these medications   acetaminophen 325 MG tablet Commonly known as: TYLENOL Take 2 tablets (650 mg total) by mouth every 6 (six) hours as needed for mild pain, fever or headache.   albuterol 108 (90 Base) MCG/ACT inhaler Commonly known as: VENTOLIN HFA Inhale 2 puffs into the lungs every 4 (four) hours as needed for wheezing or shortness of breath.   aspirin 81 MG EC tablet Take 1 tablet (81 mg total) by mouth daily with breakfast. Start taking on: Jul 23, 2019 What changed: when to take this   augmented betamethasone dipropionate 0.05 % cream Commonly known as: DIPROLENE-AF Apply 1 application topically daily as needed (for itching and irritation).   azithromycin 500 MG tablet Commonly known as: ZITHROMAX Take 1 tablet (500 mg total) by mouth daily for 5 days.   benzonatate 200 MG capsule Commonly known as: TESSALON Take 1 capsule (200 mg total) by mouth 3 (three) times daily as needed for cough. What changed:   medication strength  how much to take  when to take this  reasons to take this   calcipotriene 0.005 % ointment Commonly known as: DOVONOX Apply 1 application topically daily as needed (for itching/irritation).   carvedilol 6.25 MG tablet Commonly known as: COREG Take 1 tablet (6.25 mg total) by mouth 2 (two) times daily with  a meal. What changed:   medication strength  how much to take   cetirizine 10 MG tablet Commonly known as: ZYRTEC Take 1 tablet (10 mg total) by mouth every morning.   Dexilant 60 MG capsule Generic drug: dexlansoprazole TAKE (1) CAPSULE BY MOUTH ONCE DAILY. What changed: See the new instructions.   diltiazem 360 MG 24 hr capsule Commonly known as: TIAZAC Take 360 mg by mouth daily.   Dulera 100-5 MCG/ACT Aero Generic drug: mometasone-formoterol Inhale 2 puffs into the lungs 2 (two) times daily.   furosemide 20 MG tablet Commonly known as: LASIX Take 1 tablet (20 mg total) by mouth in the morning. What changed:   how much to take  when to take this   gabapentin 300 MG capsule Commonly known as: NEURONTIN Take 300 mg by mouth 3 (three) times daily.   guaiFENesin 600 MG 12 hr tablet Commonly known as: MUCINEX Take 1 tablet (600 mg total) by mouth 2 (two) times daily.   levothyroxine 137 MCG tablet Commonly known as: SYNTHROID Take 137 mcg by mouth daily before breakfast.   montelukast 10 MG tablet Commonly known as: SINGULAIR Take 1 tablet (10 mg total) by mouth at bedtime.   nicotine 14 mg/24hr patch Commonly known as: NICODERM CQ - dosed in mg/24 hours Place 1 patch (14 mg total) onto the skin daily for 28 days.   oxybutynin 5 MG tablet Commonly known as: DITROPAN TAKE (1) TABLET BY MOUTH THREE TIMES DAILY What changed: See the new instructions.  oxyCODONE-acetaminophen 5-325 MG tablet Commonly known as: PERCOCET/ROXICET Take 2 tablets by mouth daily as needed for moderate pain or severe pain.   potassium chloride SA 20 MEQ tablet Commonly known as: KLOR-CON Take 1 tablet (20 mEq total) by mouth every Tuesday, Thursday, Saturday, and Sunday. What changed: when to take this   pravastatin 40 MG tablet Commonly known as: PRAVACHOL Take 40 mg by mouth every morning.   predniSONE 20 MG tablet Commonly known as: DELTASONE Take 2 tablets (40 mg  total) by mouth daily with breakfast for 5 days. What changed: when to take this   sertraline 50 MG tablet Commonly known as: ZOLOFT Take 50 mg by mouth every morning.   traZODone 150 MG tablet Commonly known as: DESYREL Take 300 mg by mouth at bedtime.   Vitamin D-3 125 MCG (5000 UT) Tabs Take 1 tablet by mouth every morning.       Discharge Assessment: Vitals:   07/22/19 1135 07/22/19 1247  BP: (!) 149/68 (!) 130/56  Pulse: 70 74  Resp: 18 16  Temp:  98.4 F (36.9 C)  SpO2: 95% 99%   Skin clean, dry and intact without evidence of skin break down, no evidence of skin tears noted. IV catheter discontinued intact. Site without signs and symptoms of complications - no redness or edema noted at insertion site, patient denies c/o pain - only slight tenderness at site.  Dressing with slight pressure applied.  D/c Instructions-Education: Discharge instructions given to patient/family with verbalized understanding. D/c education completed with patient/family including follow up instructions, medication list, d/c activities limitations if indicated, with other d/c instructions as indicated by MD - patient able to verbalize understanding, all questions fully answered. Patient instructed to return to ED, call 911, or call MD for any changes in condition.  Patient escorted via [place], and D/C home via private auto.  Zachery Conch, RN 07/22/2019 4:16 PM

## 2019-07-22 NOTE — Discharge Instructions (Signed)
1)You are strongly advised to quit smoking--you may use Nicotine patch to help you quit smoking 2)Please Stop Maxzide (Triamterene/Hctz)------ 3)Take Lasix (Furosemide) 20 mg every Morning 4)Take Potassium 20 meq every Tuesday/Thursday and Saturday

## 2019-10-14 ENCOUNTER — Emergency Department (HOSPITAL_COMMUNITY)
Admission: EM | Admit: 2019-10-14 | Discharge: 2019-10-14 | Disposition: A | Discharge status: Home / Self Care | Payer: 59 | Attending: Emergency Medicine | Admitting: Emergency Medicine

## 2019-10-14 ENCOUNTER — Encounter (HOSPITAL_COMMUNITY): Payer: Self-pay | Admitting: *Deleted

## 2019-10-14 ENCOUNTER — Other Ambulatory Visit: Payer: Self-pay

## 2019-10-14 DIAGNOSIS — Z5321 Procedure and treatment not carried out due to patient leaving prior to being seen by health care provider: Secondary | ICD-10-CM | POA: Insufficient documentation

## 2019-10-14 DIAGNOSIS — R21 Rash and other nonspecific skin eruption: Secondary | ICD-10-CM | POA: Diagnosis not present

## 2019-10-14 NOTE — ED Notes (Signed)
Not available when called for room assignment

## 2019-10-14 NOTE — ED Triage Notes (Signed)
Multiple lesions in scalp for over 6 months, states she has seen 2 dermatologists without relief

## 2019-11-19 ENCOUNTER — Other Ambulatory Visit: Payer: Self-pay | Admitting: Nurse Practitioner

## 2019-11-19 ENCOUNTER — Other Ambulatory Visit (HOSPITAL_COMMUNITY): Payer: Self-pay | Admitting: Nurse Practitioner

## 2019-11-19 DIAGNOSIS — M25551 Pain in right hip: Secondary | ICD-10-CM

## 2019-12-03 ENCOUNTER — Other Ambulatory Visit: Payer: Self-pay

## 2019-12-03 ENCOUNTER — Ambulatory Visit (HOSPITAL_COMMUNITY)
Admission: RE | Admit: 2019-12-03 | Discharge: 2019-12-03 | Disposition: A | Discharge status: Home / Self Care | Payer: 59 | Source: Ambulatory Visit | Attending: Nurse Practitioner | Admitting: Nurse Practitioner

## 2019-12-03 DIAGNOSIS — M25551 Pain in right hip: Secondary | ICD-10-CM

## 2019-12-03 MED ORDER — GADOBUTROL 1 MMOL/ML IV SOLN
7.0000 mL | Freq: Once | INTRAVENOUS | Status: AC | PRN
  Administered 2019-12-03: 7 mL via INTRAVENOUS

## 2019-12-07 ENCOUNTER — Inpatient Hospital Stay (HOSPITAL_COMMUNITY): Payer: 59 | Attending: Hematology

## 2019-12-07 ENCOUNTER — Other Ambulatory Visit (HOSPITAL_COMMUNITY): Payer: Self-pay | Admitting: Nurse Practitioner

## 2019-12-07 ENCOUNTER — Ambulatory Visit (HOSPITAL_COMMUNITY): Payer: 59 | Admitting: Nurse Practitioner

## 2019-12-07 DIAGNOSIS — Z1231 Encounter for screening mammogram for malignant neoplasm of breast: Secondary | ICD-10-CM

## 2019-12-07 NOTE — Progress Notes (Deleted)
[facility], [place] [address]   CLINIC:  Medical Oncology/Hematology  PCP:  [name], NP [address] [place] [address] [phone]   REASON FOR VISIT: Follow-up for ***   CURRENT THERAPY: ***   INTERVAL HISTORY:  Ms. Blankenburg 74 y.o. female returns for routine follow-up     REVIEW OF SYSTEMS:  Review of Systems - Oncology   PAST MEDICAL/SURGICAL HISTORY:  Past Medical History:  Diagnosis Date  . Anxiety   . Carpal tunnel syndrome   . COPD (chronic obstructive pulmonary disease) ([place])   . Coronary artery disease   . Gastric ulcer   . GERD (gastroesophageal reflux disease)   . Hyperglycemia   . Hyperlipidemia   . Hypertension   . Hypothyroidism   . Osteoarthritis   . Peptic ulcer disease   . Pneumonia 2010  . Renal insufficiency   . 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 Stroke ([place]) 1990s   mild  . TIA (transient ischemic attack)    Past Surgical History:  Procedure Laterality Date  . ABDOMINAL HYSTERECTOMY    . APPENDECTOMY    . BACK SURGERY    . BONE MARROW ASPIRATION Left 04/22/14  . BONE MARROW BIOPSY Left 04/22/14  . CHOLECYSTECTOMY    . COLONOSCOPY     ?2005, [place]  . COLONOSCOPY N/A 04/07/2015   TDD:[id number]  . COLONOSCOPY WITH ESOPHAGOGASTRODUODENOSCOPY (EGD)  02/11/2012   RMR: Ulcerative/erosive reflux esophagitis, benign appearing gastric ulcer with unremarkable biopsy, no H pylori. Colonoscopy was unremarkable. Next colonoscopy in 2023  . CYSTECTOMY     cyst removed from rectal area.   . ESOPHAGOGASTRODUODENOSCOPY  01/05/2003   RMR: Normal esophagus, small hiatal hernia/ A couple of tiny antral erosions, otherwise normal stomach normal D1 and D2.   . ESOPHAGOGASTRODUODENOSCOPY N/A 08/11/2014   YHC:WCBJSEG ulcer/HH  . ESOPHAGOGASTRODUODENOSCOPY N/A 12/01/2014   BTD:[id number] improved gastric ulcerations/p bx  . ESOPHAGOGASTRODUODENOSCOPY N/A 01/31/2016   Procedure:  ESOPHAGOGASTRODUODENOSCOPY (EGD);  Surgeon: Daneil Dolin, MD;  [facility];  Service: Endoscopy;  Laterality: N/A;  745  . HEMORRHOID SURGERY    . JOINT REPLACEMENT Right    knee  . MALONEY DILATION N/A 01/31/2016   Procedure: Venia Minks DILATION;  Surgeon: Daneil Dolin, MD;  Location: [facility];  Service: Endoscopy;  Laterality: N/A;     SOCIAL HISTORY:  Social History   Socioeconomic History  . Marital status: Married    Spouse name: Not on file  . Number of children: 3  . Years of education: Not on file  . Highest education level: Not on file  Occupational History  . Not on file  Tobacco Use  . Smoking status: Current Every Day Smoker    Packs/day: 1.00    Years: 58.00    Pack years: 58.00    Types: Cigarettes  . Smokeless tobacco: Never Used  Vaping Use  . Vaping Use: Never used  Substance and Sexual Activity  . Alcohol use: No    Alcohol/week: 0.0 standard drinks  . Drug use: No  . Sexual activity: Not Currently  Other Topics Concern  . Not on file  Social History Narrative  . Not on file   Social Determinants of Health   Financial Resource Strain:   . Difficulty of Paying Living Expenses: Not on file  Food Insecurity:   . Worried About Charity fundraiser in the Last Year: Not on file  . Ran  Out of Food in the Last Year: Not on file  Transportation Needs:   . Lack of Transportation (Medical): Not on file  . Lack of Transportation (Non-Medical): Not on file  Physical Activity:   . Days of Exercise per Week: Not on file  . Minutes of Exercise per Session: Not on file  Stress:   . Feeling of Stress : Not on file  Social Connections:   . Frequency of Communication with Friends and Family: Not on file  . Frequency of Social Gatherings with Friends and Family: Not on file  . Attends Religious Services: Not on file  . Active Member of Clubs or Organizations: Not on file  . Attends Archivist Meetings: Not on file  . Marital Status:  Not on file  Intimate Partner Violence:   . Fear of Current or Ex-Partner: Not on file  . Emotionally Abused: Not on file  . Physically Abused: Not on file  . Sexually Abused: Not on file    FAMILY HISTORY:  Family History  Problem Relation Age of Onset  . Other Mother        died age 90, natural causes  . Stroke Mother   . Diabetes Daughter   . Healthy Son   . Healthy Daughter   . Healthy Daughter   . Colon cancer Neg Hx   . Liver disease Neg Hx     CURRENT MEDICATIONS:  Outpatient Encounter Medications as of 12/07/2019  Medication Sig Note  . acetaminophen (TYLENOL) 325 MG tablet Take 2 tablets (650 mg total) by mouth every 6 (six) hours as needed for mild pain, fever or headache.   . albuterol (VENTOLIN HFA) 108 (90 Base) MCG/ACT inhaler Inhale 2 puffs into the lungs every 4 (four) hours as needed for wheezing or shortness of breath.   Marland Kitchen aspirin EC 81 MG EC tablet Take 1 tablet (81 mg total) by mouth daily with breakfast.   . augmented betamethasone dipropionate (DIPROLENE-AF) 0.05 % cream Apply 1 application topically daily as needed (for itching and irritation). 06/15/2019: Pt states that this medication has been ineffective for itching and patient shows many scratch marks on arms and legs  . benzonatate (TESSALON) 200 MG capsule Take 1 capsule (200 mg total) by mouth 3 (three) times daily as needed for cough.   . calcipotriene (DOVONOX) 0.005 % ointment Apply 1 application topically daily as needed (for itching/irritation). 06/15/2019: Pt states that this medication has been ineffective for itching and patient shows many scratch marks on arms and legs  . carvedilol (COREG) 6.25 MG tablet Take 1 tablet (6.25 mg total) by mouth 2 (two) times daily with a meal.   . cetirizine (ZYRTEC) 10 MG tablet Take 1 tablet (10 mg total) by mouth every morning.   . Cholecalciferol (VITAMIN D-3) 5000 UNITS TABS Take 1 tablet by mouth every morning.    Marland Kitchen dexlansoprazole (DEXILANT) 60 MG capsule  TAKE (1) CAPSULE BY MOUTH ONCE DAILY.   Marland Kitchen diltiazem (TIAZAC) 360 MG 24 hr capsule Take 360 mg by mouth daily.   . furosemide (LASIX) 20 MG tablet Take 1 tablet (20 mg total) by mouth in the morning.   . gabapentin (NEURONTIN) 300 MG capsule Take 300 mg by mouth 3 (three) times daily.    Marland Kitchen guaiFENesin (MUCINEX) 600 MG 12 hr tablet Take 1 tablet (600 mg total) by mouth 2 (two) times daily.   Marland Kitchen levothyroxine (SYNTHROID) 137 MCG tablet Take 137 mcg by mouth daily before breakfast.   .  mometasone-formoterol (DULERA) 100-5 MCG/ACT AERO Inhale 2 puffs into the lungs 2 (two) times daily.   . montelukast (SINGULAIR) 10 MG tablet Take 1 tablet (10 mg total) by mouth at bedtime.   Marland Kitchen oxybutynin (DITROPAN) 5 MG tablet TAKE (1) TABLET BY MOUTH THREE TIMES DAILY (Patient taking differently: Take 5 mg by mouth 3 (three) times daily. )   . oxyCODONE-acetaminophen (PERCOCET/ROXICET) 5-325 MG tablet Take 2 tablets by mouth daily as needed for moderate pain or severe pain.    . potassium chloride SA (KLOR-CON) 20 MEQ tablet Take 1 tablet (20 mEq total) by mouth every Tuesday, Thursday, Saturday, and Sunday.   . pravastatin (PRAVACHOL) 40 MG tablet Take 40 mg by mouth every morning.    . sertraline (ZOLOFT) 50 MG tablet Take 50 mg by mouth every morning.    . traZODone (DESYREL) 150 MG tablet Take 300 mg by mouth at bedtime.     No facility-administered encounter medications on file as of 12/07/2019.    ALLERGIES:  Allergies  Allergen Reactions  . Tetracyclines & Related Other (See Comments)    Unknown  . Shellfish-Derived Products Nausea And Vomiting     PHYSICAL EXAM:  ECOG Performance status: ***  There were no vitals filed for this visit. There were no vitals filed for this visit. Physical Exam   LABORATORY DATA:  I have reviewed the labs as listed.  CBC    Component Value Date/Time   WBC 6.6 07/22/2019 0504   RBC 4.03 07/22/2019 0504   HGB 12.9 07/22/2019 0504   HCT 39.1 07/22/2019 0504    PLT  07/22/2019 0504    PLATELET CLUMPS NOTED ON SMEAR, COUNT APPEARS ADEQUATE   MCV 97.0 07/22/2019 0504   MCH 32.0 07/22/2019 0504   MCHC 33.0 07/22/2019 0504   RDW 11.6 07/22/2019 0504   LYMPHSABS 1.2 07/21/2019 1011   MONOABS 0.6 07/21/2019 1011   EOSABS 0.3 07/21/2019 1011   BASOSABS 0.1 07/21/2019 1011   CMP Latest Ref Rng & Units 07/22/2019 07/21/2019 06/15/2019  Glucose 70 - 99 mg/dL 148(H) 127(H) 95  BUN 8 - 23 mg/dL _0 Creatinine 0.44 - 1.00 mg/dL 0.87 1.12(H) 0.94  Sodium 135 - 145 mmol/L 139 136 139  Potassium 3.5 - 5.1 mmol/L 3.7 3.5 3.7  Chloride 98 - 111 mmol/L 100 101 107  CO2 22 - 32 mmol/L _1 Calcium 8.9 - 10.3 mg/dL 9.9 9.3 9.7  Total Protein 6.5 - 8.1 g/dL - - -  Total Bilirubin 0.3 - 1.2 mg/dL - - -  Alkaline Phos 38 - 126 U/L - - -  AST 15 - 41 U/L - - -  ALT 0 - 44 U/L - - -    DIAGNOSTIC IMAGING:  I have independently reviewed the scans and discussed with the patient.  ASSESSMENT & PLAN:  Microcytic anemia 1.  Iron deficiency anemia: -Hemoglobin on 08/18/2018 was 6.9 with MCV of 74.2.  She received 1 unit of PRBC. -Bone marrow biopsy on 04/22/2014 showed hypercellular bone marrow with trilineage hematopoiesis. -CT scan on 03/26/2017 showed spleen with normal limits. -Colonoscopy on 04/07/2015 was normal exam. -EGD on 01/31/2016 showed duodenal ulcer, erythrematous gastric mucosa and duodenal stenosis. -She took iron pills which caused severe constipation.  Hence was discontinued. -She last received Feraheme on 09/30/2018 and 10/07/2018. -Labs done on 06/04/2019 showed hemoglobin 12.3, ferritin 84, percent saturation 20 -I not recommend any IV iron at this time. -She will follow-up in 6 months with  repeat labs.  2.  History of DVT: -She is on Eliquis 5 mg twice daily.  3.  Smoking history: -She reportedly smoked 84 pack years. -Low-dose CT scan on 10/08/2017 showed lung RADS 2. -We will set up her scan at her next visit     Orders placed this  encounter:  No orders of the defined types were placed in this encounter.     Derek Jack, MD [facility] [phone]

## 2019-12-07 NOTE — Assessment & Plan Note (Deleted)
1.  Iron deficiency anemia: -Hemoglobin on 08/18/2018 was 6.9 with MCV of 74.2.  She received 1 unit of PRBC. -Bone marrow biopsy on 04/22/2014 showed hypercellular bone marrow with trilineage hematopoiesis. -CT scan on 03/26/2017 showed spleen with normal limits. -Colonoscopy on 04/07/2015 was normal exam. -EGD on 01/31/2016 showed duodenal ulcer, erythrematous gastric mucosa and duodenal stenosis. -She took iron pills which caused severe constipation.  Hence was discontinued. -She last received Feraheme on 09/30/2018 and 10/07/2018. -Labs done on 06/04/2019 showed hemoglobin 12.3, ferritin 84, percent saturation 20 -I not recommend any IV iron at this time. -She will follow-up in 6 months with repeat labs.  2.  History of DVT: -She is on Eliquis 5 mg twice daily.  3.  Smoking history: -She reportedly smoked 84 pack years. -Low-dose CT scan on 10/08/2017 showed lung RADS 2. -We will set up her scan at her next visit

## 2019-12-21 ENCOUNTER — Other Ambulatory Visit: Payer: Self-pay

## 2019-12-21 ENCOUNTER — Encounter: Payer: Self-pay | Admitting: Orthopaedic Surgery

## 2019-12-21 ENCOUNTER — Ambulatory Visit (INDEPENDENT_AMBULATORY_CARE_PROVIDER_SITE_OTHER): Payer: 59 | Admitting: Orthopaedic Surgery

## 2019-12-21 VITALS — BP 129/66 | HR 80 | Ht 64.0 in | Wt 170.0 lb

## 2019-12-21 DIAGNOSIS — M48062 Spinal stenosis, lumbar region with neurogenic claudication: Secondary | ICD-10-CM

## 2019-12-21 NOTE — Progress Notes (Signed)
Patient Connie Ponce, female DOB:04-Dec-1945, 74 y.o. BSJ:[id number]  Chief Complaint  Patient presents with  . Leg Pain     pain in right leg/hip since fall approx 6 months ago  . Hip Pain     post hip/buttock pain, radiates down to toes    HPI  Connie Ponce is a 73 y.o. female who has lower back pain with paresthesias on the right side to the toes.  She had MRI which shows: IMPRESSION: Multilevel degenerative change throughout the lumbar spine. Multilevel stenosis as described above. No change from the prior MRI.  She has left sided L4 nerve root compression as well.  I have explained the findings to her.  I will have neurosurgeon see her.  I have independently reviewed the MRI.        Body mass index is 29.18 kg/m.  ROS  Review of Systems  All other systems reviewed and are negative.  The following is a summary of the past history medically, past history surgically, known current medicines, social history and family history.  This information is gathered electronically by the computer from prior information and documentation.  I review this each visit and have found including this information at this point in the chart is beneficial and informative.    Past Medical History:  Diagnosis Date  . Anxiety   . Carpal tunnel syndrome   . COPD (chronic obstructive pulmonary disease) ([place])   . Coronary artery disease   . Gastric ulcer   . GERD (gastroesophageal reflux disease)   . Hyperglycemia   . Hyperlipidemia   . Hypertension   . Hypothyroidism   . Osteoarthritis   . Peptic ulcer disease   . Pneumonia 2010  . Renal insufficiency   . 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 Stroke ([place]) 1990s   mild  . TIA (transient ischemic attack)     Past Surgical History:  Procedure Laterality Date  . ABDOMINAL HYSTERECTOMY    . APPENDECTOMY    . BACK SURGERY    . BONE MARROW ASPIRATION Left 04/22/14  . BONE MARROW BIOPSY Left 04/22/14  . CHOLECYSTECTOMY    .  COLONOSCOPY     ?2005, [place]  . COLONOSCOPY N/A 04/07/2015   TML:[id number]  . COLONOSCOPY WITH ESOPHAGOGASTRODUODENOSCOPY (EGD)  02/11/2012   RMR: Ulcerative/erosive reflux esophagitis, benign appearing gastric ulcer with unremarkable biopsy, no H pylori. Colonoscopy was unremarkable. Next colonoscopy in 2023  . CYSTECTOMY     cyst removed from rectal area.   . ESOPHAGOGASTRODUODENOSCOPY  01/05/2003   RMR: Normal esophagus, small hiatal hernia/ A couple of tiny antral erosions, otherwise normal stomach normal D1 and D2.   . ESOPHAGOGASTRODUODENOSCOPY N/A 08/11/2014   WSF:KCLEXNT ulcer/HH  . ESOPHAGOGASTRODUODENOSCOPY N/A 12/01/2014   ZGY:[id number] improved gastric ulcerations/p bx  . ESOPHAGOGASTRODUODENOSCOPY N/A 01/31/2016   Procedure: ESOPHAGOGASTRODUODENOSCOPY (EGD);  Surgeon: Daneil Dolin, MD;  [facility];  Service: Endoscopy;  Laterality: N/A;  745  . HEMORRHOID SURGERY    . JOINT REPLACEMENT Right    knee  . MALONEY DILATION N/A 01/31/2016   Procedure: Venia Minks DILATION;  Surgeon: Daneil Dolin, MD;  [facility];  Service: Endoscopy;  Laterality: N/A;    Family History  Problem Relation Age of Onset  . Other Mother        died age 65, natural causes  . Stroke Mother   . Diabetes Daughter   . Healthy Son   . Healthy Daughter   . Healthy  Daughter   . Colon cancer Neg Hx   . Liver disease Neg Hx     Social History Social History   Tobacco Use  . Smoking status: Current Every Day Smoker    Packs/day: 1.00    Years: 58.00    Pack years: 58.00    Types: Cigarettes  . Smokeless tobacco: Never Used  Vaping Use  . Vaping Use: Never used  Substance Use Topics  . Alcohol use: No    Alcohol/week: 0.0 standard drinks  . Drug use: No    Allergies  Allergen Reactions  . Tetracyclines & Related Other (See Comments)    Unknown  . Shellfish-Derived Products Nausea And Vomiting    Current Outpatient Medications  Medication Sig  Dispense Refill  . acetaminophen (TYLENOL) 325 MG tablet Take 2 tablets (650 mg total) by mouth every 6 (six) hours as needed for mild pain, fever or headache. 15 tablet 0  . albuterol (VENTOLIN HFA) 108 (90 Base) MCG/ACT inhaler Inhale 2 puffs into the lungs every 4 (four) hours as needed for wheezing or shortness of breath. 18 g 3  . aspirin EC 81 MG EC tablet Take 1 tablet (81 mg total) by mouth daily with breakfast. 30 tablet 4  . augmented betamethasone dipropionate (DIPROLENE-AF) 0.05 % cream Apply 1 application topically daily as needed (for itching and irritation).    . benzonatate (TESSALON) 200 MG capsule Take 1 capsule (200 mg total) by mouth 3 (three) times daily as needed for cough. 30 capsule 2  . calcipotriene (DOVONOX) 0.005 % ointment Apply 1 application topically daily as needed (for itching/irritation).    . cetirizine (ZYRTEC) 10 MG tablet Take 1 tablet (10 mg total) by mouth every morning. 30 tablet 2  . Cholecalciferol (VITAMIN D-3) 5000 UNITS TABS Take 1 tablet by mouth every morning.     Marland Kitchen dexlansoprazole (DEXILANT) 60 MG capsule TAKE (1) CAPSULE BY MOUTH ONCE DAILY. 30 capsule 5  . diltiazem (TIAZAC) 360 MG 24 hr capsule Take 360 mg by mouth daily.    Marland Kitchen gabapentin (NEURONTIN) 300 MG capsule Take 300 mg by mouth 3 (three) times daily.     Marland Kitchen guaiFENesin (MUCINEX) 600 MG 12 hr tablet Take 1 tablet (600 mg total) by mouth 2 (two) times daily. 20 tablet 0  . levothyroxine (SYNTHROID) 137 MCG tablet Take 137 mcg by mouth daily before breakfast.    . mometasone-formoterol (DULERA) 100-5 MCG/ACT AERO Inhale 2 puffs into the lungs 2 (two) times daily. 13 g 3  . montelukast (SINGULAIR) 10 MG tablet Take 1 tablet (10 mg total) by mouth at bedtime. 30 tablet 5  . oxybutynin (DITROPAN) 5 MG tablet TAKE (1) TABLET BY MOUTH THREE TIMES DAILY (Patient taking differently: Take 5 mg by mouth 3 (three) times daily. ) 84 tablet 11  . oxyCODONE-acetaminophen (PERCOCET/ROXICET) 5-325 MG tablet  Take 2 tablets by mouth daily as needed for moderate pain or severe pain.     . potassium chloride SA (KLOR-CON) 20 MEQ tablet Take 1 tablet (20 mEq total) by mouth every Tuesday, Thursday, Saturday, and Sunday. 20 tablet 2  . pravastatin (PRAVACHOL) 40 MG tablet Take 40 mg by mouth every morning.     . sertraline (ZOLOFT) 50 MG tablet Take 50 mg by mouth every morning.     . traZODone (DESYREL) 150 MG tablet Take 300 mg by mouth at bedtime.   5  . carvedilol (COREG) 6.25 MG tablet Take 1 tablet (6.25 mg total) by mouth 2 (  two) times daily with a meal. 60 tablet 3  . Cholecalciferol (VITAMIN D) 125 MCG (5000 UT) CAPS Take 1 capsule by mouth daily.    . FEROSUL 325 (65 Fe) MG tablet Take 325 mg by mouth daily.    . furosemide (LASIX) 20 MG tablet Take 1 tablet (20 mg total) by mouth in the morning. 30 tablet 3  . SYMBICORT 160-4.5 MCG/ACT inhaler Inhale into the lungs.     No current facility-administered medications for this visit.     Physical Exam  Blood pressure 129/66, pulse 80, height 5' 4"  (1.626 m), weight 170 lb (77.1 kg).  Constitutional: overall normal hygiene, normal nutrition, well developed, normal grooming, normal body habitus. Assistive device:none  Musculoskeletal: gait and station Limp none, muscle tone and strength are normal, no tremors or atrophy is present.  .  Neurological: coordination overall normal.  Deep tendon reflex/nerve stretch intact.  Sensation normal.  Cranial nerves II-XII intact.   Skin:   Normal overall no scars, lesions, ulcers or rashes. No psoriasis.  Psychiatric: Alert and oriented x 3.  Recent memory intact, remote memory unclear.  Normal mood and affect. Well groomed.  Good eye contact.  Cardiovascular: overall no swelling, no varicosities, no edema bilaterally, normal temperatures of the legs and arms, no clubbing, cyanosis and good capillary refill.  Lymphatic: palpation is normal.  Spine/Pelvis examination:  Inspection:  Overall,  sacoiliac joint benign and hips nontender; without crepitus or defects.   Thoracic spine inspection: Alignment normal without kyphosis present   Lumbar spine inspection:  Alignment  with normal lumbar lordosis, without scoliosis apparent.   Thoracic spine palpation:  without tenderness of spinal processes   Lumbar spine palpation: without tenderness of lumbar area; without tightness of lumbar muscles    Range of Motion:   Lumbar flexion, forward flexion is normal without pain or tenderness    Lumbar extension is full without pain or tenderness   Left lateral bend is normal without pain or tenderness   Right lateral bend is normal without pain or tenderness   Straight leg raising is normal  Strength & tone: normal   Stability overall normal stability  All other systems reviewed and are negative   The patient has been educated about the nature of the problem(s) and counseled on treatment options.  The patient appeared to understand what I have discussed and is in agreement with it.  Encounter Diagnosis  Name Primary?  . Spinal stenosis of lumbar region with neurogenic claudication Yes    PLAN Call if any problems.  Precautions discussed.  Continue current medications.   Return to clinic to neurosurgeon   Electronically Signed Sanjuana Kava, MD 10/5/20219:17 AM

## 2019-12-21 NOTE — Patient Instructions (Signed)
You will get a call from Neurosurgery office to schedule appointment for your back and leg pain. Your leg pain is from your back. They are on [address] in [place] the phone number is [phone] and if you do not hear from them in a few days, please call them to schedule the appointment   Spinal Stenosis  Spinal stenosis happens when the open space (spinal canal) between the bones of your spine (vertebrae) gets smaller. It is caused by bone pushing into the open spaces of your backbone (spine). This puts pressure on your backbone and the nerves in your backbone. Treatment often focuses on managing any pain and symptoms. In some cases, surgery may be needed. Follow these instructions at home: Managing pain, stiffness, and swelling   Do all exercises and stretches as told by your doctor.  Stand and sit up straight (use good posture). If you were given a brace or a corset, wear it as told by your doctor.  Do not do any activities that cause pain. Ask your doctor what activities are safe for you.  Do not lift anything that is heavier than 10 lb (4.5 kg) or heavier than your doctor tells you.  Try to stay at a healthy weight. Talk with your doctor if you need help losing weight.  If directed, put heat on the affected area as often as told by your doctor. Use the heat source that your doctor recommends, such as a moist heat pack or a heating pad. ? Put a towel between your skin and the heat source. ? Leave the heat on for 20-30 minutes. ? Remove the heat if your skin turns bright red. This is especially important if you are not able to feel pain, heat, or cold. You may have a greater risk of getting burned. General instructions  Take over-the-counter and prescription medicines only as told by your doctor.  Do not use any products that contain nicotine or tobacco, such as cigarettes and e-cigarettes. If you need help quitting, ask your doctor.  Eat a healthy diet. This includes plenty  of fruits and vegetables, whole grains, and low-fat (lean) protein.  Keep all follow-up visits as told by your doctor. This is important. Contact a doctor if:  Your symptoms do not get better.  Your symptoms get worse.  You have a fever. Get help right away if:  You have new or worse pain in your neck or upper back.  You have very bad pain that medicine does not control.  You are dizzy.  You have vision problems, blurred vision, or double vision.  You have a very bad headache that is worse when you stand.  You feel sick to your stomach (nauseous).  You throw up (vomit).  You have new or worse numbness or tingling in your back or legs.  You have pain, redness, swelling, or warmth in your arm or leg. Summary  Spinal stenosis happens when the open space (spinal canal) between the bones of your spine gets smaller (narrow).  Contact a doctor if your symptoms get worse.  In some cases, surgery may be needed. This information is not intended to replace advice given to you by your health care provider. Make sure you discuss any questions you have with your health care provider. Document Revised: 02/14/2017 Document Reviewed: 02/07/2016 Elsevier Patient Education  2020 [employer].

## 2019-12-30 DIAGNOSIS — Z6830 Body mass index (BMI) 30.0-30.9, adult: Secondary | ICD-10-CM | POA: Insufficient documentation

## 2019-12-31 ENCOUNTER — Other Ambulatory Visit: Payer: Self-pay

## 2019-12-31 ENCOUNTER — Ambulatory Visit (HOSPITAL_COMMUNITY)
Admission: RE | Admit: 2019-12-31 | Discharge: 2019-12-31 | Disposition: A | Discharge status: Home / Self Care | Payer: 59 | Source: Ambulatory Visit | Attending: Nurse Practitioner | Admitting: Nurse Practitioner

## 2019-12-31 DIAGNOSIS — Z1231 Encounter for screening mammogram for malignant neoplasm of breast: Secondary | ICD-10-CM | POA: Diagnosis not present

## 2020-01-03 ENCOUNTER — Other Ambulatory Visit (HOSPITAL_COMMUNITY): Payer: Self-pay | Admitting: Nurse Practitioner

## 2020-01-05 ENCOUNTER — Other Ambulatory Visit (HOSPITAL_COMMUNITY): Payer: Self-pay | Admitting: Nurse Practitioner

## 2020-01-05 DIAGNOSIS — R928 Other abnormal and inconclusive findings on diagnostic imaging of breast: Secondary | ICD-10-CM

## 2020-01-25 ENCOUNTER — Emergency Department (HOSPITAL_COMMUNITY)
Admission: EM | Admit: 2020-01-25 | Discharge: 2020-01-25 | Disposition: A | Discharge status: Home / Self Care | Payer: 59 | Attending: Emergency Medicine | Admitting: Emergency Medicine

## 2020-01-25 ENCOUNTER — Emergency Department (HOSPITAL_COMMUNITY): Payer: 59

## 2020-01-25 ENCOUNTER — Encounter (HOSPITAL_COMMUNITY): Payer: Self-pay | Admitting: *Deleted

## 2020-01-25 ENCOUNTER — Encounter (HOSPITAL_COMMUNITY): Payer: Self-pay

## 2020-01-25 ENCOUNTER — Ambulatory Visit (HOSPITAL_COMMUNITY): Admission: RE | Admit: 2020-01-25 | Payer: 59 | Source: Ambulatory Visit

## 2020-01-25 ENCOUNTER — Other Ambulatory Visit: Payer: Self-pay

## 2020-01-25 ENCOUNTER — Inpatient Hospital Stay (HOSPITAL_COMMUNITY): Admission: RE | Admit: 2020-01-25 | Payer: 59 | Source: Ambulatory Visit

## 2020-01-25 DIAGNOSIS — E039 Hypothyroidism, unspecified: Secondary | ICD-10-CM | POA: Insufficient documentation

## 2020-01-25 DIAGNOSIS — I130 Hypertensive heart and chronic kidney disease with heart failure and stage 1 through stage 4 chronic kidney disease, or unspecified chronic kidney disease: Secondary | ICD-10-CM | POA: Insufficient documentation

## 2020-01-25 DIAGNOSIS — R609 Edema, unspecified: Secondary | ICD-10-CM | POA: Insufficient documentation

## 2020-01-25 DIAGNOSIS — Z96651 Presence of right artificial knee joint: Secondary | ICD-10-CM | POA: Diagnosis not present

## 2020-01-25 DIAGNOSIS — N390 Urinary tract infection, site not specified: Secondary | ICD-10-CM | POA: Insufficient documentation

## 2020-01-25 DIAGNOSIS — Z79899 Other long term (current) drug therapy: Secondary | ICD-10-CM | POA: Insufficient documentation

## 2020-01-25 DIAGNOSIS — R197 Diarrhea, unspecified: Secondary | ICD-10-CM | POA: Diagnosis not present

## 2020-01-25 DIAGNOSIS — R1030 Lower abdominal pain, unspecified: Secondary | ICD-10-CM | POA: Diagnosis present

## 2020-01-25 DIAGNOSIS — K449 Diaphragmatic hernia without obstruction or gangrene: Secondary | ICD-10-CM | POA: Diagnosis not present

## 2020-01-25 DIAGNOSIS — R110 Nausea: Secondary | ICD-10-CM | POA: Insufficient documentation

## 2020-01-25 DIAGNOSIS — I2510 Atherosclerotic heart disease of native coronary artery without angina pectoris: Secondary | ICD-10-CM | POA: Diagnosis not present

## 2020-01-25 DIAGNOSIS — J441 Chronic obstructive pulmonary disease with (acute) exacerbation: Secondary | ICD-10-CM | POA: Diagnosis not present

## 2020-01-25 DIAGNOSIS — F17210 Nicotine dependence, cigarettes, uncomplicated: Secondary | ICD-10-CM | POA: Diagnosis not present

## 2020-01-25 DIAGNOSIS — I5032 Chronic diastolic (congestive) heart failure: Secondary | ICD-10-CM | POA: Diagnosis not present

## 2020-01-25 DIAGNOSIS — K5790 Diverticulosis of intestine, part unspecified, without perforation or abscess without bleeding: Secondary | ICD-10-CM | POA: Diagnosis not present

## 2020-01-25 DIAGNOSIS — B9689 Other specified bacterial agents as the cause of diseases classified elsewhere: Secondary | ICD-10-CM | POA: Diagnosis not present

## 2020-01-25 DIAGNOSIS — R234 Changes in skin texture: Secondary | ICD-10-CM | POA: Insufficient documentation

## 2020-01-25 DIAGNOSIS — I700 Atherosclerosis of aorta: Secondary | ICD-10-CM | POA: Diagnosis not present

## 2020-01-25 DIAGNOSIS — R109 Unspecified abdominal pain: Secondary | ICD-10-CM

## 2020-01-25 DIAGNOSIS — N183 Chronic kidney disease, stage 3 unspecified: Secondary | ICD-10-CM | POA: Insufficient documentation

## 2020-01-25 DIAGNOSIS — J9811 Atelectasis: Secondary | ICD-10-CM | POA: Insufficient documentation

## 2020-01-25 DIAGNOSIS — Z7982 Long term (current) use of aspirin: Secondary | ICD-10-CM | POA: Diagnosis not present

## 2020-01-25 LAB — CBC WITH DIFFERENTIAL/PLATELET
Abs Immature Granulocytes: 0.06 10*3/uL (ref 0.00–0.07)
Basophils Absolute: 0 10*3/uL (ref 0.0–0.1)
Basophils Relative: 0 %
Eosinophils Absolute: 0.2 10*3/uL (ref 0.0–0.5)
Eosinophils Relative: 3 %
HCT: 38.5 % (ref 36.0–46.0)
Hemoglobin: 12.9 g/dL (ref 12.0–15.0)
Immature Granulocytes: 1 %
Lymphocytes Relative: 15 %
Lymphs Abs: 1.1 10*3/uL (ref 0.7–4.0)
MCH: 30.1 pg (ref 26.0–34.0)
MCHC: 33.5 g/dL (ref 30.0–36.0)
MCV: 90 fL (ref 80.0–100.0)
Monocytes Absolute: 0.8 10*3/uL (ref 0.1–1.0)
Monocytes Relative: 11 %
Neutro Abs: 5 10*3/uL (ref 1.7–7.7)
Neutrophils Relative %: 70 %
Platelets: 216 10*3/uL (ref 150–400)
RBC: 4.28 MIL/uL (ref 3.87–5.11)
RDW: 15.1 % (ref 11.5–15.5)
WBC: 7.1 10*3/uL (ref 4.0–10.5)
nRBC: 0 % (ref 0.0–0.2)

## 2020-01-25 LAB — COMPREHENSIVE METABOLIC PANEL
ALT: 81 U/L — ABNORMAL HIGH (ref 0–44)
AST: 37 U/L (ref 15–41)
Albumin: 3.2 g/dL — ABNORMAL LOW (ref 3.5–5.0)
Alkaline Phosphatase: 58 U/L (ref 38–126)
Anion gap: 11 (ref 5–15)
BUN: 15 mg/dL (ref 8–23)
CO2: 27 mmol/L (ref 22–32)
Calcium: 9.9 mg/dL (ref 8.9–10.3)
Chloride: 100 mmol/L (ref 98–111)
Creatinine, Ser: 0.78 mg/dL (ref 0.44–1.00)
GFR, Estimated: >60 mL/min (ref >60)
Glucose, Bld: 138 mg/dL — ABNORMAL HIGH (ref 70–99)
Potassium: 3.5 mmol/L (ref 3.5–5.1)
Sodium: 138 mmol/L (ref 135–145)
Total Bilirubin: 0.8 mg/dL (ref 0.3–1.2)
Total Protein: 6.2 g/dL — ABNORMAL LOW (ref 6.5–8.1)

## 2020-01-25 LAB — C DIFFICILE QUICK SCREEN W PCR REFLEX
C Diff antigen: POSITIVE — AB
C Diff toxin: NEGATIVE

## 2020-01-25 LAB — URINALYSIS, COMPLETE (UACMP) WITH MICROSCOPIC
Bilirubin Urine: NEGATIVE
Glucose, UA: NEGATIVE mg/dL
Hgb urine dipstick: NEGATIVE
Ketones, ur: 20 mg/dL — AB
Leukocytes,Ua: NEGATIVE
Nitrite: POSITIVE — AB
Protein, ur: 30 mg/dL — AB
Specific Gravity, Urine: 1.023 (ref 1.005–1.030)
pH: 6 (ref 5.0–8.0)

## 2020-01-25 LAB — CBG MONITORING, ED: Glucose-Capillary: 140 mg/dL — ABNORMAL HIGH (ref 70–99)

## 2020-01-25 MED ORDER — CEPHALEXIN 500 MG PO CAPS: 500.0000 mg | ORAL_CAPSULE | Freq: Four times a day (QID) | ORAL | 0 refills | Status: DC

## 2020-01-25 MED ORDER — ACETAMINOPHEN 500 MG PO TABS
1000.0000 mg | ORAL_TABLET | Freq: Once | ORAL | Status: AC
  Administered 2020-01-25: 1000 mg via ORAL
  Filled 2020-01-25: qty 2

## 2020-01-25 MED ORDER — IOHEXOL 300 MG/ML  SOLN
100.0000 mL | Freq: Once | INTRAMUSCULAR | Status: AC | PRN
  Administered 2020-01-25: 100 mL via INTRAVENOUS

## 2020-01-25 MED ORDER — SODIUM CHLORIDE 0.9 % IV SOLN
2.0000 g | Freq: Once | INTRAVENOUS | Status: AC
  Administered 2020-01-25: 2 g via INTRAVENOUS
  Filled 2020-01-25: qty 20

## 2020-01-25 MED ORDER — ONDANSETRON 4 MG PO TBDP: ORAL_TABLET | ORAL | 0 refills | Status: DC

## 2020-01-25 MED ORDER — FENTANYL CITRATE (PF) 100 MCG/2ML IJ SOLN
50.0000 ug | Freq: Once | INTRAMUSCULAR | Status: AC
  Administered 2020-01-25: 50 ug via INTRAVENOUS
  Filled 2020-01-25: qty 2

## 2020-01-25 NOTE — ED Triage Notes (Signed)
Pt c/o abdominal pain with n/v/d x 3 days; pt c/o decreased appetite and weakness all over

## 2020-01-25 NOTE — ED Provider Notes (Signed)
[facility] EMERGENCY DEPARTMENT Provider Note   CSN: [id number] Arrival date & time: 01/25/20  0129     History Chief Complaint  Patient presents with  . Abdominal Pain    Connie Ponce is a 74 y.o. female.   Abdominal Pain Pain location:  Suprapubic Pain quality: aching and sharp   Pain radiates to:  Does not radiate Pain severity:  Mild Duration:  2 days Timing:  Constant Progression:  Worsening Chronicity:  New Relieved by:  None tried Worsened by:  Nothing Associated symptoms: diarrhea and nausea   Associated symptoms: no anorexia and no fever        Past Medical History:  Diagnosis Date  . Anxiety   . Carpal tunnel syndrome   . COPD (chronic obstructive pulmonary disease) ([place])   . Coronary artery disease   . Gastric ulcer   . GERD (gastroesophageal reflux disease)   . Hyperglycemia   . Hyperlipidemia   . Hypertension   . Hypothyroidism   . Osteoarthritis   . Peptic ulcer disease   . Pneumonia 2010  . Renal insufficiency   . 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 Stroke ([place]) 1990s   mild  . TIA (transient ischemic attack)     Patient Active Problem List   Diagnosis Date Noted  . COPD with acute exacerbation ([place]) 07/21/2019  . Chronic heart failure with preserved ejection fraction (HFpEF) /diastolic dysfunction/EF 11% 07/21/2019  . Nicotine dependence 07/21/2019  . Microcytic anemia 09/23/2018  . Acute gout of left wrist 09/01/2018  . Bacteremia 08/30/2018  . Hypothyroid 08/30/2018  . Itching 08/30/2018  . Rash 08/30/2018  . Spinal stenosis of lumbar region 08/17/2018  . AKI (acute kidney injury) ([place])   . Syncope and collapse 03/28/2018  . COPD (chronic obstructive pulmonary disease) ([place]) 03/28/2018  . Tobacco abuse 03/28/2018  . Fall at home 03/28/2018  . CKD (chronic kidney disease) stage 3, GFR 30-59 ml/min 03/28/2018  . Anemia in chronic kidney disease (CKD) 03/28/2018  . Hyperlipidemia 03/28/2018  . Acute metabolic encephalopathy  94/17/4081  . Gait instability 10/03/2017  . Dysarthria 10/03/2017  . Essential hypertension 10/03/2017  . Slurred speech   . Abdominal mass, RUQ (right upper quadrant) 03/13/2017  . Abdominal pain 03/13/2017  . RUQ pain 03/13/2017  . Dysphagia   . TIA (transient ischemic attack) 01/03/2016  . Absolute anemia 07/03/2015  . Chronic cystitis 04/21/2015  . Incomplete emptying of bladder 04/21/2015  . Increased frequency of urination 04/21/2015  . SUI (stress urinary incontinence, female) 04/21/2015  . Urge incontinence 04/21/2015  . Proctalgia   . Rectal pain 01/04/2015  . Hemorrhoids 01/04/2015  . Diarrhea 01/04/2015  . Gastric ulceration   . Gastric ulcer 11/29/2014  . Toxic metabolic encephalopathy 44/81/8563  . UTI (urinary tract infection) 09/19/2014  . ARF (acute renal failure) ([place])   . LUQ pain 07/28/2014  . Nausea with vomiting 07/28/2014  . Bilateral lower extremity edema 07/28/2014  . Thrombocytopenia ([place]) 04/12/2014  . RLQ abdominal pain 01/22/2012  . Constipation 01/22/2012  . GERD (gastroesophageal reflux disease) 01/22/2012    Past Surgical History:  Procedure Laterality Date  . ABDOMINAL HYSTERECTOMY    . APPENDECTOMY    . BACK SURGERY    . BONE MARROW ASPIRATION Left 04/22/14  . BONE MARROW BIOPSY Left 04/22/14  . CHOLECYSTECTOMY    . COLONOSCOPY     ?2005, [facility]  . COLONOSCOPY N/A 04/07/2015   JSH:FWYOVZ  . COLONOSCOPY WITH ESOPHAGOGASTRODUODENOSCOPY (EGD)  02/11/2012  RMR: Ulcerative/erosive reflux esophagitis, benign appearing gastric ulcer with unremarkable biopsy, no H pylori. Colonoscopy was unremarkable. Next colonoscopy in 2023  . CYSTECTOMY     cyst removed from rectal area.   . ESOPHAGOGASTRODUODENOSCOPY  01/05/2003   RMR: Normal esophagus, small hiatal hernia/ A couple of tiny antral erosions, otherwise normal stomach normal D1 and D2.   . ESOPHAGOGASTRODUODENOSCOPY N/A 08/11/2014   UJW:JXBJYNW ulcer/HH  .  ESOPHAGOGASTRODUODENOSCOPY N/A 12/01/2014   GNF:AOZHYQMVHQION improved gastric ulcerations/p bx  . ESOPHAGOGASTRODUODENOSCOPY N/A 01/31/2016   Procedure: ESOPHAGOGASTRODUODENOSCOPY (EGD);  Surgeon: Daneil Dolin, MD;  Location: [facility] ENDO SUITE;  Service: Endoscopy;  Laterality: N/A;  745  . HEMORRHOID SURGERY    . JOINT REPLACEMENT Right    knee  . MALONEY DILATION N/A 01/31/2016   Procedure: Venia Minks DILATION;  Surgeon: Daneil Dolin, MD;  Location: [facility] ENDO SUITE;  Service: Endoscopy;  Laterality: N/A;     OB History    Gravida  4   Para      Term      Preterm      AB      Living  3     SAB      TAB      Ectopic      Multiple      Live Births              Family History  Problem Relation Age of Onset  . Other Mother        died age 67, natural causes  . Stroke Mother   . Diabetes Daughter   . Healthy Son   . Healthy Daughter   . Healthy Daughter   . Colon cancer Neg Hx   . Liver disease Neg Hx     Social History   Tobacco Use  . Smoking status: Current Every Day Smoker    Packs/day: 1.00    Years: 58.00    Pack years: 58.00    Types: Cigarettes  . Smokeless tobacco: Never Used  Vaping Use  . Vaping Use: Never used  Substance Use Topics  . Alcohol use: No    Alcohol/week: 0.0 standard drinks  . Drug use: No    Home Medications Prior to Admission medications   Medication Sig Start Date End Date Taking? Authorizing Provider  acetaminophen (TYLENOL) 325 MG tablet Take 2 tablets (650 mg total) by mouth every 6 (six) hours as needed for mild pain, fever or headache. 07/22/19   Roxan Hockey, MD  albuterol (VENTOLIN HFA) 108 (90 Base) MCG/ACT inhaler Inhale 2 puffs into the lungs every 4 (four) hours as needed for wheezing or shortness of breath. 07/22/19   Roxan Hockey, MD  aspirin EC 81 MG EC tablet Take 1 tablet (81 mg total) by mouth daily with breakfast. 07/23/19   Roxan Hockey, MD  augmented betamethasone dipropionate (DIPROLENE-AF) 0.05  % cream Apply 1 application topically daily as needed (for itching and irritation).    [provider]  benzonatate (TESSALON) 200 MG capsule Take 1 capsule (200 mg total) by mouth 3 (three) times daily as needed for cough. 07/22/19   Roxan Hockey, MD  calcipotriene (DOVONOX) 0.005 % ointment Apply 1 application topically daily as needed (for itching/irritation).    [provider]  carvedilol (COREG) 6.25 MG tablet Take 1 tablet (6.25 mg total) by mouth 2 (two) times daily with a meal. 07/22/19 11/19/19  Emokpae, Courage, MD  cephALEXin (KEFLEX) 500 MG capsule Take 1 capsule (500 mg total) by  mouth 4 (four) times daily. 01/25/20   Tamim Skog, Corene Cornea, MD  cetirizine (ZYRTEC) 10 MG tablet Take 1 tablet (10 mg total) by mouth every morning. 07/22/19   Roxan Hockey, MD  Cholecalciferol (VITAMIN D) 125 MCG (5000 UT) CAPS Take 1 capsule by mouth daily. 10/29/19   [provider]  Cholecalciferol (VITAMIN D-3) 5000 UNITS TABS Take 1 tablet by mouth every morning.     [provider]  dexlansoprazole (DEXILANT) 60 MG capsule TAKE (1) CAPSULE BY MOUTH ONCE DAILY. 07/22/19   Roxan Hockey, MD  diltiazem (TIAZAC) 360 MG 24 hr capsule Take 360 mg by mouth daily. 07/16/19   [provider]  FEROSUL 325 (65 Fe) MG tablet Take 325 mg by mouth daily. 12/06/19   [provider]  furosemide (LASIX) 20 MG tablet Take 1 tablet (20 mg total) by mouth in the morning. 07/22/19 11/19/19  Roxan Hockey, MD  gabapentin (NEURONTIN) 300 MG capsule Take 300 mg by mouth 3 (three) times daily.  07/08/17   [provider]  guaiFENesin (MUCINEX) 600 MG 12 hr tablet Take 1 tablet (600 mg total) by mouth 2 (two) times daily. 07/22/19   Roxan Hockey, MD  levothyroxine (SYNTHROID) 137 MCG tablet Take 137 mcg by mouth daily before breakfast.    [provider]  mometasone-formoterol (DULERA) 100-5 MCG/ACT AERO Inhale 2 puffs into the lungs 2 (two) times daily. 07/22/19    Roxan Hockey, MD  montelukast (SINGULAIR) 10 MG tablet Take 1 tablet (10 mg total) by mouth at bedtime. 07/22/19   Roxan Hockey, MD  ondansetron (ZOFRAN ODT) 4 MG disintegrating tablet 73m ODT q4 hours prn nausea/vomit 01/25/20   Javontay Vandam, JCorene Cornea MD  oxybutynin (DITROPAN) 5 MG tablet TAKE (1) TABLET BY MOUTH THREE TIMES DAILY Patient taking differently: Take 5 mg by mouth 3 (three) times daily.  03/26/19   EFlorian Buff MD  oxyCODONE-acetaminophen (PERCOCET/ROXICET) 5-325 MG tablet Take 2 tablets by mouth daily as needed for moderate pain or severe pain.  06/28/19   [provider]  potassium chloride SA (KLOR-CON) 20 MEQ tablet Take 1 tablet (20 mEq total) by mouth every Tuesday, Thursday, Saturday, and Sunday. 07/22/19   ERoxan Hockey MD  pravastatin (PRAVACHOL) 40 MG tablet Take 40 mg by mouth every morning.  12/01/18   [provider]  sertraline (ZOLOFT) 50 MG tablet Take 50 mg by mouth every morning.  12/01/18   [provider]  SYMBICORT 160-4.5 MCG/ACT inhaler Inhale into the lungs. 12/16/19   [provider]  traZODone (DESYREL) 150 MG tablet Take 300 mg by mouth at bedtime.  10/01/17   [provider]    Allergies    Tetracyclines & related and Shellfish-derived products  Review of Systems   Review of Systems  Constitutional: Negative for fever.  Gastrointestinal: Positive for abdominal pain, diarrhea and nausea. Negative for anorexia.  All other systems reviewed and are negative.   Physical Exam Updated Vital Signs BP (!) 157/64   Pulse 76   Temp 99.3 F (37.4 C) (Rectal)   Resp 15   Ht 5' 3"  (1.6 m)   Wt 77.1 kg   SpO2 98%   BMI 30.11 kg/m   Physical Exam Vitals and nursing note reviewed.  Constitutional:      Appearance: She is well-developed.  HENT:     Head: Normocephalic and atraumatic.  Cardiovascular:     Rate and Rhythm: Normal rate and regular rhythm.  Pulmonary:     Effort: No respiratory  distress.      Breath sounds: No stridor.  Abdominal:     General: There is no distension.     Tenderness: There is abdominal tenderness in the suprapubic area.     Hernia: No hernia is present.  Genitourinary:    Rectum: No tenderness.     Comments: Mild denuded skin around rectum and gluteal cleft likely related to diarrhea. No fluctuance Musculoskeletal:     Cervical back: Normal range of motion.  Skin:    General: Skin is warm and dry.  Neurological:     Mental Status: She is alert.     ED Results / Procedures / Treatments   Labs (all labs ordered are listed, but only abnormal results are displayed) Labs Reviewed  COMPREHENSIVE METABOLIC PANEL - Abnormal; Notable for the following components:      Result Value   Glucose, Bld 138 (*)    Total Protein 6.2 (*)    Albumin 3.2 (*)    ALT 81 (*)    All other components within normal limits  URINALYSIS, COMPLETE (UACMP) WITH MICROSCOPIC - Abnormal; Notable for the following components:   Color, Urine AMBER (*)    APPearance HAZY (*)    Ketones, ur 20 (*)    Protein, ur 30 (*)    Nitrite POSITIVE (*)    Bacteria, UA MANY (*)    All other components within normal limits  CBG MONITORING, ED - Abnormal; Notable for the following components:   Glucose-Capillary 140 (*)    All other components within normal limits  URINE CULTURE  CULTURE, BLOOD (ROUTINE X 2)  CULTURE, BLOOD (ROUTINE X 2)  C DIFFICILE QUICK SCREEN W PCR REFLEX  GASTROINTESTINAL PANEL BY PCR, STOOL (REPLACES STOOL CULTURE)  CBC WITH DIFFERENTIAL/PLATELET    EKG None  Radiology CT ABDOMEN PELVIS W CONTRAST  Result Date: 01/25/2020 CLINICAL DATA:  Abdominal pain with nausea, vomiting and diarrhea for 3 days, decreased appetite and weakness EXAM: CT ABDOMEN AND PELVIS WITH CONTRAST TECHNIQUE: Multidetector CT imaging of the abdomen and pelvis was performed using the standard protocol following bolus administration of intravenous contrast. CONTRAST:  128mL OMNIPAQUE IOHEXOL  300 MG/ML  SOLN COMPARISON:  CT 03/26/2017 FINDINGS: Lower chest: Lung bases are clear. Normal heart size. No pericardial effusion. Hepatobiliary: No worrisome focal liver lesions. Smooth liver surface contour. Normal hepatic attenuation. Post cholecystectomy. Slight prominence of the biliary tree likely related to post cholecystectomy reservoir effect. Pancreas: Partial fatty replacement of the pancreas with relative sparing of the pancreatic tail. No significant pancreatic ductal dilatation or surrounding inflammatory changes. Spleen: Normal in size. No concerning splenic lesions. Adrenals/Urinary Tract: Normal adrenal glands. Kidneys are normally located with symmetric enhancement and excretion. Lobular margins may reflect persistent fetal lobulation, benign anatomic variant. Few scattered subcentimeter hypoattenuating foci present in both kidneys too small to fully characterize on CT imaging but statistically likely benign. No suspicious renal lesion, urolithiasis or hydronephrosis. Urinary bladder is largely decompressed at the time of exam and therefore poorly evaluated by CT imaging. Wall thickening is nonspecific in the setting of underdistention. No gross bladder abnormality is evident. Stomach/Bowel: Small sliding-type hiatal hernia. Distal stomach and duodenum are otherwise unremarkable. No small bowel thickening or dilatation. No significant colonic thickening or dilatation accounting for underdistention. No pericolonic inflammatory changes. Scattered colonic diverticula without focal inflammation to suggest diverticulitis. Questionable rectal wall thickening and asymmetric thickening in the left ischioanal fossa with some linear stranding which partially abuts the levator plate, and skin thickening in  the gluteal cleft with subcutaneous edematous changes. The appendix is surgically absent. No evidence of bowel obstruction. Vascular/Lymphatic: Atherosclerotic calcifications within the abdominal aorta and  branch vessels. No aneurysm or ectasia. No enlarged abdominopelvic lymph nodes. Reproductive: Uterus is surgically absent. No concerning adnexal lesions. Quiescent appearance of the retained ovarian parenchyma. Other: Trace low-attenuation free fluid is seen in the low pelvis, etiology indeterminate. No free air. No bowel containing hernias. Small fat containing umbilical hernia. Mild body wall edema. Musculoskeletal: Multilevel degenerative changes are present in the imaged portions of the spine. Unchanged grade 1 anterolisthesis L4 on L5. Prior lumbar decompression at L4. Additional degenerative changes in the hips and pelvis including some sclerotic features at both SI joints and the symphysis pubis. Additional moderate arthrosis of the bilateral hips. Serpentine sclerosis of the femoral heads likely reflecting sequela of avascular necrosis. IMPRESSION: 1. Questionable rectal wall thickening and asymmetric thickening in the left ischioanal fossa with some linear stranding which partially abuts the levator plate, and tracks to the gluteal cleft. Additional skin thickening seen within the gluteal cleft with subcutaneous edematous changes. Findings are concerning for potential proctitis with possible developing perianal abscess. Recommend correlation with direct visualization. 2. Trace low-attenuation free fluid in the low pelvis, etiology indeterminate. 3. Small sliding-type hiatal hernia. 4. Diverticulosis without evidence of acute diverticulitis. 5. Avascular necrosis of the femoral heads. 6. Extensive degenerative changes in the spine, hips and pelvis. Prior L4-5 posterior decompression. 7. Prior cholecystectomy, hysterectomy, appendectomy. 8. Aortic Atherosclerosis (ICD10-I70.0). Electronically Signed   By: Lovena Le M.D.   On: 01/25/2020 04:10   DG Chest Port 1 View  Result Date: 01/25/2020 CLINICAL DATA:  Initial evaluation for acute abdominal pain, nausea. EXAM: PORTABLE CHEST 1 VIEW COMPARISON:   Prior radiograph from 07/21/2019. FINDINGS: Transverse heart size stable, and remains within normal limits. Mediastinal silhouette within normal limits. Aortic atherosclerosis. Lungs are hypoinflated. Secondary mild bibasilar subsegmental atelectasis. No focal infiltrates or consolidative airspace disease. No edema or effusion. No pneumothorax. No acute osseous finding. Osteoarthritic changes noted about the shoulders bilaterally. IMPRESSION: 1. Shallow lung inflation with mild bibasilar subsegmental atelectasis. 2. No other active cardiopulmonary disease. 3.  Aortic Atherosclerosis (ICD10-I70.0). Electronically Signed   By: Jeannine Boga M.D.   On: 01/25/2020 02:42    Procedures Procedures (including critical care time)  Medications Ordered in ED Medications  acetaminophen (TYLENOL) tablet 1,000 mg (1,000 mg Oral Given 01/25/20 0230)  fentaNYL (SUBLIMAZE) injection 50 mcg (50 mcg Intravenous Given 01/25/20 0230)  iohexol (OMNIPAQUE) 300 MG/ML solution 100 mL (100 mLs Intravenous Contrast Given 01/25/20 0330)  cefTRIAXone (ROCEPHIN) 2 g in sodium chloride 0.9 % 100 mL IVPB (0 g Intravenous Stopped 01/25/20 0505)    ED Course  I have reviewed the triage vital signs and the nursing notes.  Pertinent labs & imaging results that were available during my care of the patient were reviewed by me and considered in my medical decision making (see chart for details).  Clinical Course as of Jan 25 639  Tue Jan 25, 2020  0245 DG Chest [place] 1 View [JM]  [phone] Likely the cause for symptoms, will initiate abx and add on UCx  Bacteria, UA(!): MANY [JM]  0320 No e/o sepsis related to UTI  Temp: 99.3 F (37.4 C) [JM]  0320 Reassuring  WBC: 7.1 [JM]  0613 No evidence of gluteal cellulitis or abscess. If there is infection there, should be covered by keflex that will be prescribed for UTI.  CT ABDOMEN PELVIS W  CONTRAST [JM]    Clinical Course User Index [JM] Yvette Loveless, Corene Cornea, MD   MDM  Rules/Calculators/A&P                          Patient here with UTI no evidence of pyelonephritis or sepsis require admission to hospital.  No diarrhea to speak of over 5 hours while in the emergency room send C. difficile and GI pathogen panel were not able to be performed.  Culture sent on the urine.  Blood culture sent.  Patient tolerating p.o.  Feels improved.  Will continue on Keflex at home with PCP follow-up for resolution of symptoms.  Discussed with her and her husband to return here for new or worsening symptoms.  Final Clinical Impression(s) / ED Diagnoses Final diagnoses:  Abdominal pain, unspecified abdominal location  Urinary tract infection without hematuria, site unspecified    Rx / DC Orders ED Discharge Orders         Ordered    cephALEXin (KEFLEX) 500 MG capsule  4 times daily        01/25/20 0615    ondansetron (ZOFRAN ODT) 4 MG disintegrating tablet        01/25/20 0615           Labella Zahradnik, Corene Cornea, MD 01/25/20 [phone]

## 2020-01-26 LAB — GASTROINTESTINAL PANEL BY PCR, STOOL (REPLACES STOOL CULTURE)

## 2020-01-26 LAB — CLOSTRIDIUM DIFFICILE BY PCR, REFLEXED: Toxigenic C. Difficile by PCR: POSITIVE — AB

## 2020-01-27 ENCOUNTER — Other Ambulatory Visit: Payer: Self-pay | Admitting: Obstetrics & Gynecology

## 2020-01-27 LAB — URINE CULTURE: Culture: >=100000 — AB

## 2020-01-28 ENCOUNTER — Telehealth: Payer: Self-pay | Admitting: *Deleted

## 2020-01-28 ENCOUNTER — Encounter (HOSPITAL_COMMUNITY): Payer: Self-pay

## 2020-01-28 ENCOUNTER — Other Ambulatory Visit: Payer: Self-pay

## 2020-01-28 DIAGNOSIS — Z79899 Other long term (current) drug therapy: Secondary | ICD-10-CM | POA: Diagnosis not present

## 2020-01-28 DIAGNOSIS — Z96651 Presence of right artificial knee joint: Secondary | ICD-10-CM | POA: Diagnosis not present

## 2020-01-28 DIAGNOSIS — N183 Chronic kidney disease, stage 3 unspecified: Secondary | ICD-10-CM | POA: Insufficient documentation

## 2020-01-28 DIAGNOSIS — Z7982 Long term (current) use of aspirin: Secondary | ICD-10-CM | POA: Insufficient documentation

## 2020-01-28 DIAGNOSIS — E876 Hypokalemia: Secondary | ICD-10-CM | POA: Diagnosis not present

## 2020-01-28 DIAGNOSIS — I130 Hypertensive heart and chronic kidney disease with heart failure and stage 1 through stage 4 chronic kidney disease, or unspecified chronic kidney disease: Secondary | ICD-10-CM | POA: Insufficient documentation

## 2020-01-28 DIAGNOSIS — N390 Urinary tract infection, site not specified: Secondary | ICD-10-CM | POA: Insufficient documentation

## 2020-01-28 DIAGNOSIS — F17210 Nicotine dependence, cigarettes, uncomplicated: Secondary | ICD-10-CM | POA: Diagnosis not present

## 2020-01-28 DIAGNOSIS — A0472 Enterocolitis due to Clostridium difficile, not specified as recurrent: Secondary | ICD-10-CM | POA: Diagnosis not present

## 2020-01-28 DIAGNOSIS — R197 Diarrhea, unspecified: Secondary | ICD-10-CM | POA: Diagnosis present

## 2020-01-28 DIAGNOSIS — I2510 Atherosclerotic heart disease of native coronary artery without angina pectoris: Secondary | ICD-10-CM | POA: Diagnosis not present

## 2020-01-28 DIAGNOSIS — R0902 Hypoxemia: Secondary | ICD-10-CM | POA: Insufficient documentation

## 2020-01-28 DIAGNOSIS — E039 Hypothyroidism, unspecified: Secondary | ICD-10-CM | POA: Insufficient documentation

## 2020-01-28 DIAGNOSIS — A498 Other bacterial infections of unspecified site: Secondary | ICD-10-CM | POA: Diagnosis not present

## 2020-01-28 DIAGNOSIS — Z7951 Long term (current) use of inhaled steroids: Secondary | ICD-10-CM | POA: Insufficient documentation

## 2020-01-28 DIAGNOSIS — I509 Heart failure, unspecified: Secondary | ICD-10-CM | POA: Insufficient documentation

## 2020-01-28 DIAGNOSIS — J449 Chronic obstructive pulmonary disease, unspecified: Secondary | ICD-10-CM | POA: Diagnosis not present

## 2020-01-28 NOTE — Telephone Encounter (Signed)
Post ED Visit - Positive Culture Follow-up  Culture report reviewed by antimicrobial stewardship pharmacist: [facility] []  , Pharm.D. []  Enzo Bi, Pharm.D., BCPS AQ-ID []  , Pharm.D., BCPS []  Celedonio Miyamoto, .D., BCPS []  [place], .D., BCPS, AAHIVP []  Georgina Pillion, Pharm.D., BCPS, AAHIVP []  [address], PharmD, BCPS []  , PharmD, BCPS []  [place], PharmD, BCPS []  [address], PharmD []  , PharmD, BCPS []  Estella Husk, PharmD  Pharmacy Team []  Lysle Pearl, PharmD []  , PharmD []  Phillips Climes, PharmD []  , Rph []  Agapito Games) , PharmD []  Verlan Friends, PharmD []  , PharmD []  Mervyn Gay, PharmD []  , PharmD []  Vinnie Level, PharmD []  [facility], PharmD []  , PharmD []  Len Childs, PharmD   Positive urine culture Treated with Cephalexin, organism sensitive to the same and no further patient follow-up is required at this time. , PharmD  Greer Pickerel Talley 01/28/2020, 7:29 AM

## 2020-01-28 NOTE — ED Triage Notes (Addendum)
Pt brought in by family due to weakness and diarrhea. Unable to keep anything on stomach . Fell Sunday night and hurt right shoulder. Pt reports that [facility] called her twice today and told her to come and get fluids if she was no better. Pt has been taking abt for UTI. Noted to be positive for C diff

## 2020-01-29 ENCOUNTER — Emergency Department (HOSPITAL_COMMUNITY): Payer: 59

## 2020-01-29 ENCOUNTER — Emergency Department (HOSPITAL_COMMUNITY)
Admission: EM | Admit: 2020-01-29 | Discharge: 2020-01-29 | Disposition: A | Discharge status: Home / Self Care | Payer: 59 | Attending: Emergency Medicine | Admitting: Emergency Medicine

## 2020-01-29 DIAGNOSIS — A498 Other bacterial infections of unspecified site: Secondary | ICD-10-CM

## 2020-01-29 DIAGNOSIS — R0902 Hypoxemia: Secondary | ICD-10-CM

## 2020-01-29 DIAGNOSIS — E876 Hypokalemia: Secondary | ICD-10-CM

## 2020-01-29 DIAGNOSIS — A0472 Enterocolitis due to Clostridium difficile, not specified as recurrent: Secondary | ICD-10-CM

## 2020-01-29 LAB — CBC WITH DIFFERENTIAL/PLATELET
Abs Immature Granulocytes: 0.09 10*3/uL — ABNORMAL HIGH (ref 0.00–0.07)
Basophils Absolute: 0.1 10*3/uL (ref 0.0–0.1)
Basophils Relative: 1 %
Eosinophils Absolute: 0.5 10*3/uL (ref 0.0–0.5)
Eosinophils Relative: 8 %
HCT: 40.4 % (ref 36.0–46.0)
Hemoglobin: 13.1 g/dL (ref 12.0–15.0)
Immature Granulocytes: 2 %
Lymphocytes Relative: 23 %
Lymphs Abs: 1.4 10*3/uL (ref 0.7–4.0)
MCH: 30.3 pg (ref 26.0–34.0)
MCHC: 32.4 g/dL (ref 30.0–36.0)
MCV: 93.5 fL (ref 80.0–100.0)
Monocytes Absolute: 0.7 10*3/uL (ref 0.1–1.0)
Monocytes Relative: 12 %
Neutro Abs: 3.3 10*3/uL (ref 1.7–7.7)
Neutrophils Relative %: 54 %
Platelets: 192 10*3/uL (ref 150–400)
RBC: 4.32 MIL/uL (ref 3.87–5.11)
RDW: 15 % (ref 11.5–15.5)
WBC: 6 10*3/uL (ref 4.0–10.5)
nRBC: 0 % (ref 0.0–0.2)

## 2020-01-29 LAB — BASIC METABOLIC PANEL
Anion gap: 7 (ref 5–15)
BUN: 9 mg/dL (ref 8–23)
CO2: 30 mmol/L (ref 22–32)
Calcium: 9.4 mg/dL (ref 8.9–10.3)
Chloride: 101 mmol/L (ref 98–111)
Creatinine, Ser: 0.94 mg/dL (ref 0.44–1.00)
GFR, Estimated: >60 mL/min (ref >60)
Glucose, Bld: 90 mg/dL (ref 70–99)
Potassium: 3.3 mmol/L — ABNORMAL LOW (ref 3.5–5.1)
Sodium: 138 mmol/L (ref 135–145)

## 2020-01-29 LAB — URINALYSIS, ROUTINE W REFLEX MICROSCOPIC
Bacteria, UA: NONE SEEN
Bilirubin Urine: NEGATIVE
Glucose, UA: NEGATIVE mg/dL
Hgb urine dipstick: NEGATIVE
Ketones, ur: NEGATIVE mg/dL
Nitrite: NEGATIVE
Protein, ur: NEGATIVE mg/dL
Specific Gravity, Urine: 1.008 (ref 1.005–1.030)
pH: 6 (ref 5.0–8.0)

## 2020-01-29 MED ORDER — IPRATROPIUM-ALBUTEROL 0.5-2.5 (3) MG/3ML IN SOLN
3.0000 mL | Freq: Once | RESPIRATORY_TRACT | Status: DC
Start: 2020-01-29 — End: 2020-01-29

## 2020-01-29 MED ORDER — LACTATED RINGERS IV BOLUS
1000.0000 mL | Freq: Once | INTRAVENOUS | Status: AC
  Administered 2020-01-29: 1000 mL via INTRAVENOUS

## 2020-01-29 MED ORDER — VANCOMYCIN 50 MG/ML ORAL SOLUTION: 125.0000 mg | Freq: Four times a day (QID) | ORAL | 0 refills | Status: AC

## 2020-01-29 MED ORDER — POTASSIUM CHLORIDE CRYS ER 20 MEQ PO TBCR
40.0000 meq | EXTENDED_RELEASE_TABLET | Freq: Once | ORAL | Status: AC
  Administered 2020-01-29: 40 meq via ORAL
  Filled 2020-01-29: qty 2

## 2020-01-29 MED ORDER — MOMETASONE FURO-FORMOTEROL FUM 100-5 MCG/ACT IN AERO
2.0000 | INHALATION_SPRAY | Freq: Two times a day (BID) | RESPIRATORY_TRACT | Status: DC
  Administered 2020-01-29: 2 via RESPIRATORY_TRACT
  Filled 2020-01-29: qty 8.8

## 2020-01-29 MED ORDER — CARVEDILOL 3.125 MG PO TABS
6.2500 mg | ORAL_TABLET | Freq: Once | ORAL | Status: AC
  Administered 2020-01-29: 6.25 mg via ORAL
  Filled 2020-01-29: qty 2

## 2020-01-29 MED ORDER — MORPHINE SULFATE (PF) 4 MG/ML IV SOLN
4.0000 mg | Freq: Once | INTRAVENOUS | Status: AC
  Administered 2020-01-29: 4 mg via INTRAVENOUS
  Filled 2020-01-29: qty 1

## 2020-01-29 MED ORDER — VANCOMYCIN 50 MG/ML ORAL SOLUTION
125.0000 mg | Freq: Four times a day (QID) | ORAL | Status: DC
  Administered 2020-01-29: 125 mg via ORAL
  Filled 2020-01-29 (×10): qty 2.5

## 2020-01-29 MED ORDER — ALBUTEROL SULFATE HFA 108 (90 BASE) MCG/ACT IN AERS
2.0000 | INHALATION_SPRAY | Freq: Once | RESPIRATORY_TRACT | Status: AC
  Administered 2020-01-29: 2 via RESPIRATORY_TRACT
  Filled 2020-01-29: qty 6.7

## 2020-01-29 NOTE — ED Notes (Signed)
SATURATION QUALIFICATIONS: (This note is used to comply with regulatory documentation for home oxygen)  Patient Saturations on Room Air at Rest = 97%  Patient Saturations on Room Air while Ambulating = 88%  Patient Saturations on 2 Liters of oxygen while Ambulating = 98%  Please briefly explain why patient needs home oxygen: Pt O2 dropped to 88% through the night while ambulating, pt placed on 2L

## 2020-01-29 NOTE — ED Notes (Signed)
Pt in bed, pt reports a headache, states that she will just take tylenol when she gets home.  Daughter outside, states that she will call home to make sure that O2 has arrived, pt states that she is ready to go home, d/c pt iv, cath intact, MD and SW states that O2 has been delivered and pt is ready for d/c.  Pt verbalized understanding d/c instructions and to get her medications filled.  Pt from dpt via wc.  Advised to return for any concerns or worsening symptoms.

## 2020-01-29 NOTE — ED Notes (Signed)
Respiratory at bedside.

## 2020-01-29 NOTE — ED Provider Notes (Signed)
[facility] EMERGENCY DEPARTMENT Provider Note   CSN: [id number] Arrival date & time: 01/28/20  1732   History Chief Complaint  Patient presents with  . Weakness    Connie Ponce is a 74 y.o. female.  The history is provided by the patient.  Weakness She had been seen in the emergency department 5 days ago for urinary tract infection and diarrhea.  She had been discharged with a prescription for cephalexin.  Since being at home, she continues to have severe diarrhea and also is having some nausea and vomiting.  She feels generally weak.  She thinks she has run low-grade fevers but has not checked her temperature.  There have been no chills or sweats.  She comes in tonight because she is worried she is getting dehydrated.  She is also complaining of ongoing pain in her right shoulder.  She had fallen at home and hit her right shoulder.  She is complaining of gluteal irritation from all of the diarrhea that she has had.  Past Medical History:  Diagnosis Date  . Anxiety   . Carpal tunnel syndrome   . COPD (chronic obstructive pulmonary disease) ([place])   . Coronary artery disease   . Gastric ulcer   . GERD (gastroesophageal reflux disease)   . Hyperglycemia   . Hyperlipidemia   . Hypertension   . Hypothyroidism   . Osteoarthritis   . Peptic ulcer disease   . Pneumonia 2010  . Renal insufficiency   . 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 Stroke ([place]) 1990s   mild  . TIA (transient ischemic attack)     Patient Active Problem List   Diagnosis Date Noted  . COPD with acute exacerbation ([place]) 07/21/2019  . Chronic heart failure with preserved ejection fraction (HFpEF) /diastolic dysfunction/EF 87% 07/21/2019  . Nicotine dependence 07/21/2019  . Microcytic anemia 09/23/2018  . Acute gout of left wrist 09/01/2018  . Bacteremia 08/30/2018  . Hypothyroid 08/30/2018  . Itching 08/30/2018  . Rash 08/30/2018  . Spinal stenosis of lumbar region 08/17/2018  . AKI (acute kidney injury)  ([place])   . Syncope and collapse 03/28/2018  . COPD (chronic obstructive pulmonary disease) ([place]) 03/28/2018  . Tobacco abuse 03/28/2018  . Fall at home 03/28/2018  . CKD (chronic kidney disease) stage 3, GFR 30-59 ml/min 03/28/2018  . Anemia in chronic kidney disease (CKD) 03/28/2018  . Hyperlipidemia 03/28/2018  . Acute metabolic encephalopathy 56/43/3295  . Gait instability 10/03/2017  . Dysarthria 10/03/2017  . Essential hypertension 10/03/2017  . Slurred speech   . Abdominal mass, RUQ (right upper quadrant) 03/13/2017  . Abdominal pain 03/13/2017  . RUQ pain 03/13/2017  . Dysphagia   . TIA (transient ischemic attack) 01/03/2016  . Absolute anemia 07/03/2015  . Chronic cystitis 04/21/2015  . Incomplete emptying of bladder 04/21/2015  . Increased frequency of urination 04/21/2015  . SUI (stress urinary incontinence, female) 04/21/2015  . Urge incontinence 04/21/2015  . Proctalgia   . Rectal pain 01/04/2015  . Hemorrhoids 01/04/2015  . Diarrhea 01/04/2015  . Gastric ulceration   . Gastric ulcer 11/29/2014  . Toxic metabolic encephalopathy 18/84/1660  . UTI (urinary tract infection) 09/19/2014  . ARF (acute renal failure) ([place])   . LUQ pain 07/28/2014  . Nausea with vomiting 07/28/2014  . Bilateral lower extremity edema 07/28/2014  . Thrombocytopenia ([place]) 04/12/2014  . RLQ abdominal pain 01/22/2012  . Constipation 01/22/2012  . GERD (gastroesophageal reflux disease) 01/22/2012    Past Surgical History:  Procedure  Laterality Date  . ABDOMINAL HYSTERECTOMY    . APPENDECTOMY    . BACK SURGERY    . BONE MARROW ASPIRATION Left 04/22/14  . BONE MARROW BIOPSY Left 04/22/14  . CHOLECYSTECTOMY    . COLONOSCOPY     ?2005, [place]  . COLONOSCOPY N/A 04/07/2015   PPJ:[id number]  . COLONOSCOPY WITH ESOPHAGOGASTRODUODENOSCOPY (EGD)  02/11/2012   RMR: Ulcerative/erosive reflux esophagitis, benign appearing gastric ulcer with unremarkable biopsy, no H pylori. Colonoscopy was  unremarkable. Next colonoscopy in 2023  . CYSTECTOMY     cyst removed from rectal area.   . ESOPHAGOGASTRODUODENOSCOPY  01/05/2003   RMR: Normal esophagus, small hiatal hernia/ A couple of tiny antral erosions, otherwise normal stomach normal D1 and D2.   . ESOPHAGOGASTRODUODENOSCOPY N/A 08/11/2014   TIW:PYKDXIP ulcer/HH  . ESOPHAGOGASTRODUODENOSCOPY N/A 12/01/2014   JAS:[id number] improved gastric ulcerations/p bx  . ESOPHAGOGASTRODUODENOSCOPY N/A 01/31/2016   Procedure: ESOPHAGOGASTRODUODENOSCOPY (EGD);  Surgeon: Daneil Dolin, MD;  Location: [facility] ENDO SUITE;  Service: Endoscopy;  Laterality: N/A;  745  . HEMORRHOID SURGERY    . JOINT REPLACEMENT Right    knee  . MALONEY DILATION N/A 01/31/2016   Procedure: Venia Minks DILATION;  Surgeon: Daneil Dolin, MD;  Location: [facility] ENDO SUITE;  Service: Endoscopy;  Laterality: N/A;     OB History    Gravida  4   Para      Term      Preterm      AB      Living  3     SAB      TAB      Ectopic      Multiple      Live Births              Family History  Problem Relation Age of Onset  . Other Mother        died age 52, natural causes  . Stroke Mother   . Diabetes Daughter   . Healthy Son   . Healthy Daughter   . Healthy Daughter   . Colon cancer Neg Hx   . Liver disease Neg Hx     Social History   Tobacco Use  . Smoking status: Current Every Day Smoker    Packs/day: 1.00    Years: 58.00    Pack years: 58.00    Types: Cigarettes  . Smokeless tobacco: Never Used  Vaping Use  . Vaping Use: Never used  Substance Use Topics  . Alcohol use: No    Alcohol/week: 0.0 standard drinks  . Drug use: No    Home Medications Prior to Admission medications   Medication Sig Start Date End Date Taking? Authorizing Provider  acetaminophen (TYLENOL) 325 MG tablet Take 2 tablets (650 mg total) by mouth every 6 (six) hours as needed for mild pain, fever or headache. 07/22/19   Roxan Hockey, MD  albuterol (VENTOLIN HFA)  108 (90 Base) MCG/ACT inhaler Inhale 2 puffs into the lungs every 4 (four) hours as needed for wheezing or shortness of breath. 07/22/19   Roxan Hockey, MD  aspirin EC 81 MG EC tablet Take 1 tablet (81 mg total) by mouth daily with breakfast. 07/23/19   Roxan Hockey, MD  augmented betamethasone dipropionate (DIPROLENE-AF) 0.05 % cream Apply 1 application topically daily as needed (for itching and irritation).    [provider]  benzonatate (TESSALON) 200 MG capsule Take 1 capsule (200 mg total) by mouth 3 (three) times daily as needed for cough. 07/22/19  Roxan Hockey, MD  calcipotriene (DOVONOX) 0.005 % ointment Apply 1 application topically daily as needed (for itching/irritation).    [provider]  carvedilol (COREG) 6.25 MG tablet Take 1 tablet (6.25 mg total) by mouth 2 (two) times daily with a meal. 07/22/19 11/19/19  Emokpae, Courage, MD  cephALEXin (KEFLEX) 500 MG capsule Take 1 capsule (500 mg total) by mouth 4 (four) times daily. 01/25/20   Mesner, Corene Cornea, MD  cetirizine (ZYRTEC) 10 MG tablet Take 1 tablet (10 mg total) by mouth every morning. 07/22/19   Roxan Hockey, MD  Cholecalciferol (VITAMIN D) 125 MCG (5000 UT) CAPS Take 1 capsule by mouth daily. 10/29/19   [provider]  Cholecalciferol (VITAMIN D-3) 5000 UNITS TABS Take 1 tablet by mouth every morning.     [provider]  dexlansoprazole (DEXILANT) 60 MG capsule TAKE (1) CAPSULE BY MOUTH ONCE DAILY. 07/22/19   Roxan Hockey, MD  diltiazem (TIAZAC) 360 MG 24 hr capsule Take 360 mg by mouth daily. 07/16/19   [provider]  FEROSUL 325 (65 Fe) MG tablet Take 325 mg by mouth daily. 12/06/19   [provider]  furosemide (LASIX) 20 MG tablet Take 1 tablet (20 mg total) by mouth in the morning. 07/22/19 11/19/19  Roxan Hockey, MD  gabapentin (NEURONTIN) 300 MG capsule Take 300 mg by mouth 3 (three) times daily.  07/08/17   [provider]  guaiFENesin (MUCINEX) 600 MG  12 hr tablet Take 1 tablet (600 mg total) by mouth 2 (two) times daily. 07/22/19   Roxan Hockey, MD  levothyroxine (SYNTHROID) 137 MCG tablet Take 137 mcg by mouth daily before breakfast.    [provider]  mometasone-formoterol (DULERA) 100-5 MCG/ACT AERO Inhale 2 puffs into the lungs 2 (two) times daily. 07/22/19   Roxan Hockey, MD  montelukast (SINGULAIR) 10 MG tablet Take 1 tablet (10 mg total) by mouth at bedtime. 07/22/19   Roxan Hockey, MD  ondansetron (ZOFRAN ODT) 4 MG disintegrating tablet 48m ODT q4 hours prn nausea/vomit 01/25/20   Mesner, JCorene Cornea MD  oxybutynin (DITROPAN) 5 MG tablet TAKE (1) TABLET BY MOUTH THREE TIMES DAILY 01/27/20   EFlorian Buff MD  oxyCODONE-acetaminophen (PERCOCET/ROXICET) 5-325 MG tablet Take 2 tablets by mouth daily as needed for moderate pain or severe pain.  06/28/19   [provider]  potassium chloride SA (KLOR-CON) 20 MEQ tablet Take 1 tablet (20 mEq total) by mouth every Tuesday, Thursday, Saturday, and Sunday. 07/22/19   ERoxan Hockey MD  pravastatin (PRAVACHOL) 40 MG tablet Take 40 mg by mouth every morning.  12/01/18   [provider]  sertraline (ZOLOFT) 50 MG tablet Take 50 mg by mouth every morning.  12/01/18   [provider]  SYMBICORT 160-4.5 MCG/ACT inhaler Inhale into the lungs. 12/16/19   [provider]  traZODone (DESYREL) 150 MG tablet Take 300 mg by mouth at bedtime.  10/01/17   [provider]    Allergies    Tetracyclines & related and Shellfish-derived products  Review of Systems   Review of Systems  Neurological: Positive for weakness.  All other systems reviewed and are negative.   Physical Exam Updated Vital Signs BP (!) 151/64   Pulse 66   Temp 98.1 F (36.7 C) (Oral)   Resp 17   Ht _0  (1.6 m)   Wt 77 kg   SpO2 93%   BMI 30.07 kg/m   Physical Exam Vitals and nursing note reviewed.   74year old  female, resting comfortably and in no acute distress.  Vital signs are significant for elevated systolic blood pressure. Oxygen saturation is 93%, which is normal. Head is normocephalic and atraumatic. PERRLA, EOMI. Oropharynx is clear. Neck is nontender and supple without adenopathy or JVD. Back is nontender and there is no CVA tenderness. Lungs are clear without rales, wheezes, or rhonchi. Chest is nontender. Heart has regular rate and rhythm without murmur. Abdomen is soft, flat, nontender without masses or hepatosplenomegaly and peristalsis is hypoactive. Extremities have no cyanosis or edema.  There is mild tenderness to palpation in the soft tissue around the right shoulder without any point tenderness.  There is pain on passive range of motion of the right shoulder.  Remainder of extremity exam is normal. Skin is warm and dry.  Moderate gluteal and perianal erythema without any areas of skin breakdown. Neurologic: Mental status is normal, cranial nerves are intact, there are no motor or sensory deficits.  ED Results / Procedures / Treatments   Labs (all labs ordered are listed, but only abnormal results are displayed) Labs Reviewed  CBC WITH DIFFERENTIAL/PLATELET - Abnormal; Notable for the following components:      Result Value   Abs Immature Granulocytes 0.09 (*)    All other components within normal limits  BASIC METABOLIC PANEL - Abnormal; Notable for the following components:   Potassium 3.3 (*)    All other components within normal limits  URINALYSIS, ROUTINE W REFLEX MICROSCOPIC - Abnormal; Notable for the following components:   Leukocytes,Ua TRACE (*)    All other components within normal limits    EKG EKG Interpretation  Date/Time:  Saturday January 29 2020 00:47:15 EST Ventricular Rate:  78 PR Interval:    QRS Duration: 130 QT Interval:  393 QTC Calculation: 448 R Axis:   -49 Text Interpretation: Sinus rhythm Left bundle branch block When compared with ECG of 01/25/2020, No significant change was found Confirmed  by Delora Fuel ([id number]) on 01/29/2020 1:13:44 AM   Radiology DG Shoulder Right  Result Date: 01/29/2020 CLINICAL DATA:  Pain status post fall EXAM: RIGHT SHOULDER - 2+ VIEW COMPARISON:  None. FINDINGS: There is no acute displaced fracture or dislocation. Mild degenerative changes are noted of the glenohumeral joint. There are moderate degenerative changes of the right AC joint. IMPRESSION: No acute displaced fracture or dislocation. Mild degenerative changes of the glenohumeral joint and moderate degenerative changes of the right AC joint. Electronically Signed   By: Constance Holster M.D.   On: 01/29/2020 02:23   DG Chest Port 1 View  Result Date: 01/29/2020 CLINICAL DATA:  Weakness. EXAM: PORTABLE CHEST 1 VIEW COMPARISON:  01/25/2020 FINDINGS: 0556 hours. The lungs are clear without focal pneumonia, edema, pneumothorax or pleural effusion. Interstitial markings are diffusely coarsened with chronic features. The cardiopericardial silhouette is within normal limits for size. The visualized bony structures of the thorax show no acute abnormality. Telemetry leads overlie the chest. IMPRESSION: Chronic interstitial changes without acute cardiopulmonary findings. Electronically Signed   By: Misty Stanley M.D.   On: 01/29/2020 06:25    Procedures Procedures   Medications Ordered in ED Medications  lactated ringers bolus 1,000 mL (has no administration in time range)  morphine 4 MG/ML injection 4 mg (has no administration in time range)    ED Course  I have reviewed the triage vital signs and the nursing notes.  Pertinent lab results that were available during my care of the patient were reviewed by me and considered in my medical  decision making (see chart for details).  MDM Rules/Calculators/A&P Persistent diarrhea secondary to C. difficile.  Weakness secondary to vomiting and diarrhea.  Pain in the right shoulder.  Old records are reviewed confirming ED visit on 11/9 with positive urine  culture and positive stool C. difficile PCR.  She is on appropriate antibiotics for her UTI.  We will recheck urinalysis to see if it has cleared so that antibiotics could be discontinued.  She will need to be started on oral vancomycin.  Will check metabolic panel and give IV fluids.  Shoulder x-ray shows no evidence of fracture.  Labs show mild hypokalemia.  Urine appears clean.  Patient is not feeling any better after IV fluids.  She did develop hypoxia while walking to the bathroom with oxygen saturation dropping to 88%.  She does not have oxygen at home.  At this point, I think she needs to be considered for admission because of hypoxia and may need ongoing IV hydration.  Case is discussed with Dr. Wynetta Emery who agrees to come and evaluate the patient in the ED.  Final Clinical Impression(s) / ED Diagnoses Final diagnoses:  C. difficile colitis  Hypokalemia  Hypoxia    Rx / DC Orders ED Discharge Orders    None       Delora Fuel, MD 80/22/33 [phone]

## 2020-01-29 NOTE — ED Notes (Signed)
Pt assisted to bathroom and back to bed; upon arrival back to room and at bedside pt c/o feeling very sob; pt's O2 sats at 88%; pt placed on O2 at 2L [place] and sats increased to 98%; BSC placed at bedside and pt encouraged to call if assistance needed; Dr. Preston Fleeting informed

## 2020-01-29 NOTE — Consult Note (Addendum)
[facility] Consult Note  Connie Ponce EPP:[id number],SAY:[id number]  Patients out patient PCP is [name], NP Consult requested in the Hospital by Wyvonnia Dusky, MD, On 01/29/2020   Reason for consult: evaluation of hypoxia and c diff infection   With History of  Active Problems:   Clostridium difficile infection   Hypokalemia  Pt presented to ED with complaints of weakness and worry about dehydration.  She has been having profuse watery foul smelling diarrhea for past several days.  This started after she had completed a course of antibiotics for a klebsiella UTI infection that was treated. She reports possible low grade temperature at home but her main complaint has been the diarrhea and the burning on her sacrum and bottom from the diarrhea.  She has long history of COPD and her PCP had been referring her to a pulmonologist for further testing and work up.  She otherwise has been well and not having other complaints.  Her labs were reviewed in ED with findings of a mild hypokalemia.  She had a normal WBC, hg, platelet count, renal function and blood glucose.  She was given supplemental potassium and IV fluids and is feeling much better.  When ambulating to bathroom pulse ox dropped to 88% and was placed on 2L/min nasal cannula and given a neb treatment.  CXR with no acute findings.  TR[facility] was asked for consultation.    Past Medical History:  Diagnosis Date  . Anxiety   . Carpal tunnel syndrome   . COPD (chronic obstructive pulmonary disease) ([place])   . Coronary artery disease   . Gastric ulcer   . GERD (gastroesophageal reflux disease)   . Hyperglycemia   . Hyperlipidemia   . Hypertension   . Hypothyroidism   . Osteoarthritis   . Peptic ulcer disease   . Pneumonia 2010  . Renal insufficiency   . 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 Stroke ([place]) 1990s   mild  . TIA (transient ischemic attack)      Past Surgical History:  Procedure Laterality Date  .  ABDOMINAL HYSTERECTOMY    . APPENDECTOMY    . BACK SURGERY    . BONE MARROW ASPIRATION Left 04/22/14  . BONE MARROW BIOPSY Left 04/22/14  . CHOLECYSTECTOMY    . COLONOSCOPY     ?2005, [place]  . COLONOSCOPY N/A 04/07/2015   ATF:TDDUKG  . COLONOSCOPY WITH ESOPHAGOGASTRODUODENOSCOPY (EGD)  02/11/2012   RMR: Ulcerative/erosive reflux esophagitis, benign appearing gastric ulcer with unremarkable biopsy, no H pylori. Colonoscopy was unremarkable. Next colonoscopy in 2023  . CYSTECTOMY     cyst removed from rectal area.   . ESOPHAGOGASTRODUODENOSCOPY  01/05/2003   RMR: Normal esophagus, small hiatal hernia/ A couple of tiny antral erosions, otherwise normal stomach normal D1 and D2.   . ESOPHAGOGASTRODUODENOSCOPY N/A 08/11/2014   URK:YHCWCBJ ulcer/HH  . ESOPHAGOGASTRODUODENOSCOPY N/A 12/01/2014   SEG:[id number] improved gastric ulcerations/p bx  . ESOPHAGOGASTRODUODENOSCOPY N/A 01/31/2016   Procedure: ESOPHAGOGASTRODUODENOSCOPY (EGD);  Surgeon: Daneil Dolin, MD;  [facility];  Service: Endoscopy;  Laterality: N/A;  745  . HEMORRHOID SURGERY    . JOINT REPLACEMENT Right    knee  . MALONEY DILATION N/A 01/31/2016   Procedure: Venia Minks DILATION;  Surgeon: Daneil Dolin, MD;  [facility]: [facility];  Service: Endoscopy;  Laterality: N/A;    Past Surgical History:  Procedure Laterality Date  . ABDOMINAL HYSTERECTOMY    . APPENDECTOMY    . BACK SURGERY    .  BONE MARROW ASPIRATION Left 04/22/14  . BONE MARROW BIOPSY Left 04/22/14  . CHOLECYSTECTOMY    . COLONOSCOPY     ?2005, [place]  . COLONOSCOPY N/A 04/07/2015   YSA:YTKZSW  . COLONOSCOPY WITH ESOPHAGOGASTRODUODENOSCOPY (EGD)  02/11/2012   RMR: Ulcerative/erosive reflux esophagitis, benign appearing gastric ulcer with unremarkable biopsy, no H pylori. Colonoscopy was unremarkable. Next colonoscopy in 2023  . CYSTECTOMY     cyst removed from rectal area.   . ESOPHAGOGASTRODUODENOSCOPY  01/05/2003   RMR:  Normal esophagus, small hiatal hernia/ A couple of tiny antral erosions, otherwise normal stomach normal D1 and D2.   . ESOPHAGOGASTRODUODENOSCOPY N/A 08/11/2014   FUX:NATFTDD ulcer/HH  . ESOPHAGOGASTRODUODENOSCOPY N/A 12/01/2014   UKG:[id number] improved gastric ulcerations/p bx  . ESOPHAGOGASTRODUODENOSCOPY N/A 01/31/2016   Procedure: ESOPHAGOGASTRODUODENOSCOPY (EGD);  Surgeon: Daneil Dolin, MD;  [facility];  Service: Endoscopy;  Laterality: N/A;  745  . HEMORRHOID SURGERY    . JOINT REPLACEMENT Right    knee  . MALONEY DILATION N/A 01/31/2016   Procedure: Venia Minks DILATION;  Surgeon: Daneil Dolin, MD;  [facility];  Service: Endoscopy;  Laterality: N/A;   Review of Systems   All positive symptoms noted in HPI, otherwise negative.    Social History Social History   Tobacco Use  . Smoking status: Current Every Day Smoker    Packs/day: 1.00    Years: 58.00    Pack years: 58.00    Types: Cigarettes  . Smokeless tobacco: Never Used  Substance Use Topics  . Alcohol use: No    Alcohol/week: 0.0 standard drinks     Family History Family History  Problem Relation Age of Onset  . Other Mother        died age 45, natural causes  . Stroke Mother   . Diabetes Daughter   . Healthy Son   . Healthy Daughter   . Healthy Daughter   . Colon cancer Neg Hx   . Liver disease Neg Hx      Prior to Admission medications   Medication Sig Start Date End Date Taking? Authorizing Provider  acetaminophen (TYLENOL) 325 MG tablet Take 2 tablets (650 mg total) by mouth every 6 (six) hours as needed for mild pain, fever or headache. 07/22/19   Roxan Hockey, MD  albuterol (VENTOLIN HFA) 108 (90 Base) MCG/ACT inhaler Inhale 2 puffs into the lungs every 4 (four) hours as needed for wheezing or shortness of breath. 07/22/19   Roxan Hockey, MD  aspirin EC 81 MG EC tablet Take 1 tablet (81 mg total) by mouth daily with breakfast. 07/23/19   Roxan Hockey, MD   augmented betamethasone dipropionate (DIPROLENE-AF) 0.05 % cream Apply 1 application topically daily as needed (for itching and irritation).    [provider]  benzonatate (TESSALON) 200 MG capsule Take 1 capsule (200 mg total) by mouth 3 (three) times daily as needed for cough. 07/22/19   Roxan Hockey, MD  calcipotriene (DOVONOX) 0.005 % ointment Apply 1 application topically daily as needed (for itching/irritation).    [provider]  carvedilol (COREG) 6.25 MG tablet Take 1 tablet (6.25 mg total) by mouth 2 (two) times daily with a meal. 07/22/19 11/19/19  Emokpae, Courage, MD  cephALEXin (KEFLEX) 500 MG capsule Take 1 capsule (500 mg total) by mouth 4 (four) times daily. 01/25/20   Mesner, Corene Cornea, MD  cetirizine (ZYRTEC) 10 MG tablet Take 1 tablet (10 mg total) by mouth every morning. 07/22/19   Emokpae,  Courage, MD  Cholecalciferol (VITAMIN D) 125 MCG (5000 UT) CAPS Take 1 capsule by mouth daily. 10/29/19   [provider]  Cholecalciferol (VITAMIN D-3) 5000 UNITS TABS Take 1 tablet by mouth every morning.     [provider]  dexlansoprazole (DEXILANT) 60 MG capsule TAKE (1) CAPSULE BY MOUTH ONCE DAILY. 07/22/19   Roxan Hockey, MD  diltiazem (TIAZAC) 360 MG 24 hr capsule Take 360 mg by mouth daily. 07/16/19   [provider]  FEROSUL 325 (65 Fe) MG tablet Take 325 mg by mouth daily. 12/06/19   [provider]  furosemide (LASIX) 20 MG tablet Take 1 tablet (20 mg total) by mouth in the morning. 07/22/19 11/19/19  Roxan Hockey, MD  gabapentin (NEURONTIN) 300 MG capsule Take 300 mg by mouth 3 (three) times daily.  07/08/17   [provider]  guaiFENesin (MUCINEX) 600 MG 12 hr tablet Take 1 tablet (600 mg total) by mouth 2 (two) times daily. 07/22/19   Roxan Hockey, MD  levothyroxine (SYNTHROID) 137 MCG tablet Take 137 mcg by mouth daily before breakfast.    [provider]  mometasone-formoterol (DULERA) 100-5 MCG/ACT AERO Inhale  2 puffs into the lungs 2 (two) times daily. 07/22/19   Roxan Hockey, MD  montelukast (SINGULAIR) 10 MG tablet Take 1 tablet (10 mg total) by mouth at bedtime. 07/22/19   Roxan Hockey, MD  ondansetron (ZOFRAN ODT) 4 MG disintegrating tablet 4mg  ODT q4 hours prn nausea/vomit 01/25/20   Mesner, Corene Cornea, MD  oxybutynin (DITROPAN) 5 MG tablet TAKE (1) TABLET BY MOUTH THREE TIMES DAILY 01/27/20   Florian Buff, MD  oxyCODONE-acetaminophen (PERCOCET/ROXICET) 5-325 MG tablet Take 2 tablets by mouth daily as needed for moderate pain or severe pain.  06/28/19   [provider]  potassium chloride SA (KLOR-CON) 20 MEQ tablet Take 1 tablet (20 mEq total) by mouth every Tuesday, Thursday, Saturday, and Sunday. 07/22/19   Roxan Hockey, MD  pravastatin (PRAVACHOL) 40 MG tablet Take 40 mg by mouth every morning.  12/01/18   [provider]  sertraline (ZOLOFT) 50 MG tablet Take 50 mg by mouth every morning.  12/01/18   [provider]  SYMBICORT 160-4.5 MCG/ACT inhaler Inhale into the lungs. 12/16/19   [provider]  traZODone (DESYREL) 150 MG tablet Take 300 mg by mouth at bedtime.  10/01/17   [provider]  vancomycin (VANCOCIN) 50 mg/mL oral solution Take 2.5 mLs (125 mg total) by mouth 4 (four) times daily for 10 days. 01/29/20 02/08/20  Murlean Iba, MD    Allergies  Allergen Reactions  . Tetracyclines & Related Other (See Comments)    Unknown  . Shellfish-Derived Products Nausea And Vomiting   Physical Exam  Intake/Output Summary (Last 24 hours) at 01/29/2020 0821 Last data filed at 01/29/2020 0455 Gross per 24 hour  Intake 1000 ml  Output --  Net 1000 ml   Blood pressure (!) 157/77, pulse 93, temperature 98.1 F (36.7 C), temperature source Oral, resp. rate (!) 24, height 5\' 3"  (1.6 m), weight 77 kg, SpO2 100 %.  BP (!) 157/77   Pulse 93   Temp 98.1 F (36.7 C) (Oral)   Resp (!) 24   Ht 5\' 3"  (1.6 m)   Wt 77 kg   SpO2 95%   BMI  30.07 kg/m   General Appearance:    Alert, cooperative, no distress, appears stated age  Head:    Normocephalic, without obvious abnormality, atraumatic  Eyes:  PERRL, conjunctiva/corneas clear, EOM's intact, fundi    benign, both eyes  Ears:    Normal external ear canals, both ears  Nose:   Nares normal, septum midline, mucosa normal, no drainage    or sinus tenderness  Throat:   Lips, mucosa, and tongue normal;  Neck:   Supple, symmetrical, trachea midline, no adenopathy;    thyroid:  no enlargement/tenderness/nodules; no carotid   bruit or JVD  Back:     Symmetric, no curvature, ROM normal, no CVA tenderness  Lungs:     Expiratory wheezes heard, respirations unlabored  Chest Wall:    No tenderness or deformity   Heart:    Regular rate and rhythm, S1 and S2 normal, no murmur, rub   or gallop  Breast Exam:    deferred  Abdomen:     Soft, non-tender, bowel sounds active all four quadrants,    no masses, no organomegaly  Genitalia:    deferred  Rectal:    deferred  Extremities:   Extremities normal, atraumatic, no cyanosis or edema  Pulses:   2+ and symmetric all extremities  Skin:   Skin color, texture, turgor normal, no rashes or lesions  Lymph nodes:   Cervical, supraclavicular, and axillary nodes normal  Neurologic:   CNII-XII intact, normal strength, sensation and reflexes    throughout   Data Review CBC w Diff:  Lab Results  Component Value Date   WBC 6.0 01/29/2020   HGB 13.1 01/29/2020   HCT 40.4 01/29/2020   PLT 192 01/29/2020   LYMPHOPCT 23 01/29/2020   MONOPCT 12 01/29/2020   EOSPCT 8 01/29/2020   BASOPCT 1 01/29/2020    CMP:  Lab Results  Component Value Date   NA 138 01/29/2020   NA 140 01/17/2012   K 3.3 (L) 01/29/2020   K 3.7 01/17/2012   CL 101 01/29/2020   CO2 30 01/29/2020   BUN 9 01/29/2020   BUN 17 01/17/2012   CREATININE 0.94 01/29/2020   CREATININE 1.37 (H) 03/13/2017   PROT 6.2 (L) 01/25/2020   ALBUMIN 3.2 (L) 01/25/2020   ALBUMIN 3.9  01/17/2012   BILITOT 0.8 01/25/2020   BILITOT 0.2 01/17/2012   ALKPHOS 58 01/25/2020   ALKPHOS 92 01/17/2012   AST 37 01/25/2020   AST 12 01/17/2012   ALT 81 (H) 01/25/2020    Coagulation:  Lab Results  Component Value Date   INR 0.96 10/03/2017    Cardiac markers:  Lab Results  Component Value Date   TROPONINI <0.03 08/16/2018   Assessment & Plan  1.  C diff infection - from recent course of antibiotics.  Per protocol for first occurrence will treat with oral vancomycin solution 125 mg 4 times daily for 10 days.  I am arranging for a home health RN to assist her at home and observation.  Close follow up with PCP recommended.  Patient information given for C diff infection.    2.  COPD - not in acute exacerbation. Continue home bronchodilators.  Follow up with PCP.    3.  Hypoxia - secondary to COPD - Will do home O2 eval and arrange for home oxygen.  I have consulted TOC team to make arrangements.  4.  Hypokalemia - treated orally in the ED.    Pt was given instructions to return to ED if symptoms worsen or new problems develop.  Pt verbalized understanding.     Thank you for the consult, TRH will follow the patient with you.  Oswin Johal  James Ivanoff, MD How to contact the [facility] Attending or Consulting provider [place] or covering provider during after hours [place], for this patient?  1. Check the care team in [facility] and look for a) attending/consulting [facility] provider listed and b) the [facility] team listed 2. Log into [email or web address] and use [place]'s universal password to access. If you do not have the password, please contact the hospital operator. 3. Locate the [facility] provider you are looking for under [facility] and page to a number that you can be directly reached. 4. If you still have difficulty reaching the provider, please page the [facility] (Director on Call) for the Hospitalists listed on amion for assistance.

## 2020-01-29 NOTE — TOC Transition Note (Addendum)
Transition of Care [facility]) - CM/SW Discharge Note   Patient Details  Name: Connie Ponce MRN: [id number] Date of Birth: Sep 24, 1945  Transition of Care [facility]) CM/SW Contact:  Barry Brunner, LCSW Phone Number: 01/29/2020, 12:17 PM   Clinical Narrative:    CSW notified of patient's HH and O2 orders. CSW placed referral for home O2 with [facility] from [facility]. [facility] agreeable to provide O2 for patient. CSW contacted Barbara Cower with [facility] for [facility] orders. Barbara Cower agreeable to provide [facility] services for patient. TOC signing off.   Addendum 3:39 CSW was contacted by Barbara Cower with [facility] and was notified that patient is not in network. Barbara Cower also performed an Theatre stage manager for [employer], and [employer]. Patient's insurance also is not in network with above [facility] agencies. Chip Boer, and [facility] HH agencies not able to provide services outside of [facility]. CSW contacted patient's husband and notified him of HH service's inability to provide services. CSW provided instructions for how to follow up with PCP to identify in network [facility] providers. CSW offered assistance if patient or PCP requires information for [facility] referral. TOC signing off.  Final next level of care: Home w Home Health Services Barriers to Discharge: Barriers Resolved   Patient Goals and CMS Choice Patient states their goals for this hospitalization and ongoing recovery are:: return home with [facility]   Choice offered to / list presented to : Patient  Discharge Placement                    Patient and family notified of of transfer: 01/29/20  Discharge Plan and Services                  DME Agency: [facility] Date DME Agency Contacted: 01/29/20 Time DME Agency Contacted: 0900 Representative spoke with at DME Agency: [facility] HH Arranged: PT, RN [facility] Agency: [facility] Date HH Agency Contacted: 01/29/20 Time HH Agency Contacted: 1209 Representative spoke with at [facility] Agency: Barbara Cower  Social  Determinants of Health (SDOH) Interventions     Readmission Risk Interventions Readmission Risk Prevention Plan 08/17/2018  Transportation Screening Complete  HRI or Home Care Consult Complete  Social Work Consult for Recovery Care Planning/Counseling Complete  Palliative Care Screening Not Applicable  Medication Review Oceanographer) Complete  Some recent data might be hidden

## 2020-01-29 NOTE — ED Notes (Signed)
Pt assisted back to bed and hooked back up to the monitor

## 2020-01-29 NOTE — ED Notes (Signed)
Pt assisted to BSC

## 2020-01-31 LAB — CULTURE, BLOOD (ROUTINE X 2)
Culture: NO GROWTH
Culture: NO GROWTH
Special Requests: ADEQUATE
Special Requests: ADEQUATE

## 2020-02-18 ENCOUNTER — Ambulatory Visit (HOSPITAL_COMMUNITY)
Admission: RE | Admit: 2020-02-18 | Discharge: 2020-02-18 | Disposition: A | Discharge status: Home / Self Care | Payer: 59 | Source: Ambulatory Visit | Attending: Nurse Practitioner | Admitting: Nurse Practitioner

## 2020-02-18 ENCOUNTER — Other Ambulatory Visit: Payer: Self-pay

## 2020-02-18 DIAGNOSIS — R928 Other abnormal and inconclusive findings on diagnostic imaging of breast: Secondary | ICD-10-CM | POA: Insufficient documentation

## 2020-03-07 DIAGNOSIS — R768 Other specified abnormal immunological findings in serum: Secondary | ICD-10-CM | POA: Insufficient documentation

## 2020-04-18 ENCOUNTER — Other Ambulatory Visit: Payer: Self-pay | Admitting: Nurse Practitioner

## 2020-04-18 ENCOUNTER — Other Ambulatory Visit (HOSPITAL_COMMUNITY): Payer: Self-pay | Admitting: Nurse Practitioner

## 2020-04-18 DIAGNOSIS — Z720 Tobacco use: Secondary | ICD-10-CM

## 2020-05-01 ENCOUNTER — Emergency Department (HOSPITAL_COMMUNITY): Payer: 59

## 2020-05-01 ENCOUNTER — Encounter (HOSPITAL_COMMUNITY): Payer: Self-pay | Admitting: *Deleted

## 2020-05-01 ENCOUNTER — Other Ambulatory Visit: Payer: Self-pay

## 2020-05-01 ENCOUNTER — Emergency Department (HOSPITAL_COMMUNITY)
Admission: EM | Admit: 2020-05-01 | Discharge: 2020-05-01 | Disposition: A | Discharge status: Home / Self Care | Payer: 59 | Attending: Emergency Medicine | Admitting: Emergency Medicine

## 2020-05-01 DIAGNOSIS — Z79899 Other long term (current) drug therapy: Secondary | ICD-10-CM | POA: Diagnosis not present

## 2020-05-01 DIAGNOSIS — Z7982 Long term (current) use of aspirin: Secondary | ICD-10-CM | POA: Insufficient documentation

## 2020-05-01 DIAGNOSIS — F17210 Nicotine dependence, cigarettes, uncomplicated: Secondary | ICD-10-CM | POA: Insufficient documentation

## 2020-05-01 DIAGNOSIS — X58XXXA Exposure to other specified factors, initial encounter: Secondary | ICD-10-CM | POA: Diagnosis not present

## 2020-05-01 DIAGNOSIS — I5033 Acute on chronic diastolic (congestive) heart failure: Secondary | ICD-10-CM | POA: Insufficient documentation

## 2020-05-01 DIAGNOSIS — S90822A Blister (nonthermal), left foot, initial encounter: Secondary | ICD-10-CM | POA: Insufficient documentation

## 2020-05-01 DIAGNOSIS — N1832 Chronic kidney disease, stage 3b: Secondary | ICD-10-CM | POA: Insufficient documentation

## 2020-05-01 DIAGNOSIS — I2510 Atherosclerotic heart disease of native coronary artery without angina pectoris: Secondary | ICD-10-CM | POA: Diagnosis not present

## 2020-05-01 DIAGNOSIS — Z96651 Presence of right artificial knee joint: Secondary | ICD-10-CM | POA: Diagnosis not present

## 2020-05-01 DIAGNOSIS — I130 Hypertensive heart and chronic kidney disease with heart failure and stage 1 through stage 4 chronic kidney disease, or unspecified chronic kidney disease: Secondary | ICD-10-CM | POA: Insufficient documentation

## 2020-05-01 DIAGNOSIS — J441 Chronic obstructive pulmonary disease with (acute) exacerbation: Secondary | ICD-10-CM | POA: Insufficient documentation

## 2020-05-01 DIAGNOSIS — E039 Hypothyroidism, unspecified: Secondary | ICD-10-CM | POA: Insufficient documentation

## 2020-05-01 DIAGNOSIS — Z7951 Long term (current) use of inhaled steroids: Secondary | ICD-10-CM | POA: Insufficient documentation

## 2020-05-01 DIAGNOSIS — R52 Pain, unspecified: Secondary | ICD-10-CM

## 2020-05-01 DIAGNOSIS — S91302A Unspecified open wound, left foot, initial encounter: Secondary | ICD-10-CM

## 2020-05-01 MED ORDER — SULFAMETHOXAZOLE-TRIMETHOPRIM 800-160 MG PO TABS: 1.0000 | ORAL_TABLET | Freq: Two times a day (BID) | ORAL | 0 refills | Status: AC

## 2020-05-01 NOTE — ED Provider Notes (Signed)
[facility] Provider Note   CSN: [id number] Arrival date & time: 05/01/20  1045     History No chief complaint on file.   Connie Ponce is a 75 y.o. 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 Onset 10 days ago. No trauma or burn. Wound was initially a small blister that grew and popped.  Has associated redness, swelling, pain.  Worse with weight bearing, palpation.  Has been using hydroxen peroxide daily to clean it. Went to urgent care 5 days ago and was prescribed keflex. States since being on antibiotic symptoms have improved. Was instructed to return to UC today and once evaluated told to come to ER. Has intermittent chills but no fevers. No increased swelling, redness up the foot or leg since being on keflex. States long history of blister like skin lesions from prurigo nodularis in the past but usually not this big, eventually dry up and heel but leave dark scars.  No history of diabetes.    HPI     Past Medical History:  Diagnosis Date  . Anxiety   . Carpal tunnel syndrome   . COPD (chronic obstructive pulmonary disease) ([place])   . Coronary artery disease   . Gastric ulcer   . GERD (gastroesophageal reflux disease)   . Hyperglycemia   . Hyperlipidemia   . Hypertension   . Hypothyroidism   . Osteoarthritis   . Peptic ulcer disease   . Pneumonia 2010  . Renal insufficiency   . 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 Stroke ([place]) 1990s   mild  . TIA (transient ischemic attack)     Patient Active Problem List   Diagnosis Date Noted  . Clostridium difficile infection 01/29/2020  . Hypokalemia 01/29/2020  . COPD with acute exacerbation ([place]) 07/21/2019  . Chronic heart failure with preserved ejection fraction (HFpEF) /diastolic dysfunction/EF 12% 07/21/2019  . Nicotine dependence 07/21/2019  . Microcytic anemia 09/23/2018  . Acute gout of left wrist 09/01/2018  . Bacteremia  08/30/2018  . Hypothyroid 08/30/2018  . Itching 08/30/2018  . Rash 08/30/2018  . Spinal stenosis of lumbar region 08/17/2018  . AKI (acute kidney injury) ([place])   . Syncope and collapse 03/28/2018  . COPD (chronic obstructive pulmonary disease) ([place]) 03/28/2018  . Tobacco abuse 03/28/2018  . Fall at home 03/28/2018  . CKD (chronic kidney disease) stage 3, GFR 30-59 ml/min 03/28/2018  . Anemia in chronic kidney disease (CKD) 03/28/2018  . Hyperlipidemia 03/28/2018  . Acute metabolic encephalopathy 19/75/8832  . Gait instability 10/03/2017  . Dysarthria 10/03/2017  . Essential hypertension 10/03/2017  . Slurred speech   . Abdominal mass, RUQ (right upper quadrant) 03/13/2017  . Abdominal pain 03/13/2017  . RUQ pain 03/13/2017  . Dysphagia   . TIA (transient ischemic attack) 01/03/2016  . Absolute anemia 07/03/2015  . Chronic cystitis 04/21/2015  . Incomplete emptying of bladder 04/21/2015  . Increased frequency of urination 04/21/2015  . SUI (stress urinary incontinence, female) 04/21/2015  . Urge incontinence 04/21/2015  . Proctalgia   . Rectal pain 01/04/2015  . Hemorrhoids 01/04/2015  . Diarrhea 01/04/2015  . Gastric ulceration   . Gastric ulcer 11/29/2014  . Toxic metabolic encephalopathy 54/98/2641  . UTI (urinary tract infection) 09/19/2014  . ARF (acute renal failure) ([place])   . LUQ pain 07/28/2014  . Nausea with vomiting 07/28/2014  . Bilateral lower extremity edema 07/28/2014  . RLQ abdominal pain 01/22/2012  .  Constipation 01/22/2012  . GERD (gastroesophageal reflux disease) 01/22/2012    Past Surgical History:  Procedure Laterality Date  . ABDOMINAL HYSTERECTOMY    . APPENDECTOMY    . BACK SURGERY    . BONE MARROW ASPIRATION Left 04/22/14  . BONE MARROW BIOPSY Left 04/22/14  . CHOLECYSTECTOMY    . COLONOSCOPY     ?2005, [facility]  . COLONOSCOPY N/A 04/07/2015   AHR:PETQNN  . COLONOSCOPY WITH ESOPHAGOGASTRODUODENOSCOPY (EGD)  02/11/2012   RMR:  Ulcerative/erosive reflux esophagitis, benign appearing gastric ulcer with unremarkable biopsy, no H pylori. Colonoscopy was unremarkable. Next colonoscopy in 2023  . CYSTECTOMY     cyst removed from rectal area.   . ESOPHAGOGASTRODUODENOSCOPY  01/05/2003   RMR: Normal esophagus, small hiatal hernia/ A couple of tiny antral erosions, otherwise normal stomach normal D1 and D2.   . ESOPHAGOGASTRODUODENOSCOPY N/A 08/11/2014   LTM:BABQWFO ulcer/HH  . ESOPHAGOGASTRODUODENOSCOPY N/A 12/01/2014   AXB:[id number] improved gastric ulcerations/p bx  . ESOPHAGOGASTRODUODENOSCOPY N/A 01/31/2016   Procedure: ESOPHAGOGASTRODUODENOSCOPY (EGD);  Surgeon: Corbin Ade, MD;  Location: [facility];  Service: Endoscopy;  Laterality: N/A;  745  . HEMORRHOID SURGERY    . JOINT REPLACEMENT Right    knee  . MALONEY DILATION N/A 01/31/2016   Procedure: Elease Hashimoto DILATION;  Surgeon: Corbin Ade, MD;  Location: [facility];  Service: Endoscopy;  Laterality: N/A;     OB History    Gravida  4   Para      Term      Preterm      AB      Living  3     SAB      IAB      Ectopic      Multiple      Live Births              Family History  Problem Relation Age of Onset  . Other Mother        died age 20, natural causes  . Stroke Mother   . Diabetes Daughter   . Healthy Son   . Healthy Daughter   . Healthy Daughter   . Colon cancer Neg Hx   . Liver disease Neg Hx     Social History   Tobacco Use  . Smoking status: Current Every Day Smoker    Packs/day: 1.00    Years: 58.00    Pack years: 58.00    Types: Cigarettes  . Smokeless tobacco: Never Used  Vaping Use  . Vaping Use: Never used  Substance Use Topics  . Alcohol use: No    Alcohol/week: 0.0 standard drinks  . Drug use: No    Home Medications Prior to Admission medications   Medication Sig Start Date End Date Taking? Authorizing Provider  sulfamethoxazole-trimethoprim (BACTRIM DS) 800-160 MG tablet Take 1 tablet  by mouth 2 (two) times daily for 7 days. 05/01/20 05/08/20 Yes Liberty Handy, PA-C  acetaminophen (TYLENOL) 325 MG tablet Take 2 tablets (650 mg total) by mouth every 6 (six) hours as needed for mild pain, fever or headache. 07/22/19   Shon Hale, MD  albuterol (VENTOLIN HFA) 108 (90 Base) MCG/ACT inhaler Inhale 2 puffs into the lungs every 4 (four) hours as needed for wheezing or shortness of breath. 07/22/19   Shon Hale, MD  aspirin EC 81 MG EC tablet Take 1 tablet (81 mg total) by mouth daily with breakfast. 07/23/19   Shon Hale, MD  augmented betamethasone dipropionate (DIPROLENE-AF)  0.05 % cream Apply 1 application topically daily as needed (for itching and irritation).    [provider]  benzonatate (TESSALON) 200 MG capsule Take 1 capsule (200 mg total) by mouth 3 (three) times daily as needed for cough. 07/22/19   Roxan Hockey, MD  calcipotriene (DOVONOX) 0.005 % ointment Apply 1 application topically daily as needed (for itching/irritation).    [provider]  carvedilol (COREG) 6.25 MG tablet Take 1 tablet (6.25 mg total) by mouth 2 (two) times daily with a meal. 07/22/19 11/19/19  Roxan Hockey, MD  cetirizine (ZYRTEC) 10 MG tablet Take 1 tablet (10 mg total) by mouth every morning. 07/22/19   Roxan Hockey, MD  Cholecalciferol (VITAMIN D) 125 MCG (5000 UT) CAPS Take 1 capsule by mouth daily. 10/29/19   [provider]  Cholecalciferol (VITAMIN D-3) 5000 UNITS TABS Take 1 tablet by mouth every morning.     [provider]  dexlansoprazole (DEXILANT) 60 MG capsule TAKE (1) CAPSULE BY MOUTH ONCE DAILY. 07/22/19   Roxan Hockey, MD  diltiazem (TIAZAC) 360 MG 24 hr capsule Take 360 mg by mouth daily. 07/16/19   [provider]  FEROSUL 325 (65 Fe) MG tablet Take 325 mg by mouth daily. 12/06/19   [provider]  furosemide (LASIX) 20 MG tablet Take 1 tablet (20 mg total) by mouth in the morning. 07/22/19 11/19/19  Roxan Hockey, MD  gabapentin (NEURONTIN) 300 MG capsule Take 300 mg by mouth 3 (three) times daily.  07/08/17   [provider]  guaiFENesin (MUCINEX) 600 MG 12 hr tablet Take 1 tablet (600 mg total) by mouth 2 (two) times daily. 07/22/19   Roxan Hockey, MD  levothyroxine (SYNTHROID) 137 MCG tablet Take 137 mcg by mouth daily before breakfast.    [provider]  mometasone-formoterol (DULERA) 100-5 MCG/ACT AERO Inhale 2 puffs into the lungs 2 (two) times daily. 07/22/19   Roxan Hockey, MD  montelukast (SINGULAIR) 10 MG tablet Take 1 tablet (10 mg total) by mouth at bedtime. 07/22/19   Roxan Hockey, MD  ondansetron (ZOFRAN ODT) 4 MG disintegrating tablet 1m ODT q4 hours prn nausea/vomit 01/25/20   Mesner, JCorene Cornea MD  oxybutynin (DITROPAN) 5 MG tablet TAKE (1) TABLET BY MOUTH THREE TIMES DAILY 01/27/20   EFlorian Buff MD  oxyCODONE-acetaminophen (PERCOCET/ROXICET) 5-325 MG tablet Take 2 tablets by mouth daily as needed for moderate pain or severe pain.  06/28/19   [provider]  potassium chloride SA (KLOR-CON) 20 MEQ tablet Take 1 tablet (20 mEq total) by mouth every Tuesday, Thursday, Saturday, and Sunday. 07/22/19   ERoxan Hockey MD  pravastatin (PRAVACHOL) 40 MG tablet Take 40 mg by mouth every morning.  12/01/18   [provider]  sertraline (ZOLOFT) 50 MG tablet Take 50 mg by mouth every morning.  12/01/18   [provider]  SYMBICORT 160-4.5 MCG/ACT inhaler Inhale into the lungs. 12/16/19   [provider]  traZODone (DESYREL) 150 MG tablet Take 300 mg by mouth at bedtime.  10/01/17   [provider]    Allergies    Tetracyclines & related and Shellfish-derived products  Review of Systems   Review of Systems  Skin: Positive for wound.  Allergic/Immunologic: Positive for immunocompromised state (methotrexate).  All other systems reviewed and are negative.   Physical Exam Updated Vital Signs BP 138/60 (BP Location: Right  Arm)   Pulse 92   Temp 97.7 F (36.5 C) (Oral)   Resp 18   Ht  $'5\' 3"'c$  (1.6 m)   SpO2 98%   BMI 30.07 kg/m   Physical Exam Constitutional:      Appearance: She is well-developed.  HENT:     Head: Normocephalic.     Nose: Nose normal.  Eyes:     General: Lids are normal.  Cardiovascular:     Rate and Rhythm: Normal rate.  Pulmonary:     Effort: Pulmonary effort is normal. No respiratory distress.  Musculoskeletal:        General: Normal range of motion.     Cervical back: Normal range of motion.  Skin:    Comments: Round wound/blister in left medial heel approx 9 cm x 7 cm. Large skin flap covering wound.  Minimal erythema circumferentially without extension onto foot or leg.  No significant warmth, fluctuance. Tender in the center.  Granulation tissue beneath blister flap. See photo   Neurological:     Mental Status: She is alert.  Psychiatric:        Behavior: Behavior normal.        ED Results / Procedures / Treatments   Labs (all labs ordered are listed, but only abnormal results are displayed) Labs Reviewed  AEROBIC/ANAEROBIC CULTURE W GRAM STAIN (SURGICAL/DEEP WOUND)    EKG None  Radiology DG Foot Complete Left  Result Date: 05/01/2020 CLINICAL DATA:  Pain with wound in the medial hindfoot region EXAM: LEFT FOOT - COMPLETE 3+ VIEW COMPARISON:  None. FINDINGS: Frontal, oblique, and lateral views were obtained. No fracture or dislocation. There is joint space narrowing with spurring in the first MTP joint. There is also narrowing of all PIP and DIP joints as well as the first IP joint. There is spurring in the dorsal midfoot. There is no erosive change or bony destruction. There is no soft tissue air. There are small posterior and inferior calcaneal spurs. IMPRESSION: Multifocal osteoarthritic change. Calcaneal spurs noted. No fracture or dislocation. No erosive change or bony destruction. No soft tissue air evident. Electronically Signed   By: Lowella Grip III  M.D.   On: 05/01/2020 11:26    Procedures Debridement  Date/Time: 05/01/2020 2:49 PM Performed by: Kinnie Feil, PA-C Authorized by: Kinnie Feil, PA-C  Consent: Verbal consent obtained. Risks and benefits: risks, benefits and alternatives were discussed Consent given by: patient Patient understanding: patient states understanding of the procedure being performed Patient consent: the patient's understanding of the procedure matches consent given Imaging studies: imaging studies available Required items: required blood products, implants, devices, and special equipment available Patient identity confirmed: verbally with patient Time out: Immediately prior to procedure a "time out" was called to verify the correct patient, procedure, equipment, support staff and site/side marked as required. Preparation: Patient was prepped and draped in the usual sterile fashion. Local anesthesia used: no  Anesthesia: Local anesthesia used: no  Sedation: Patient sedated: no  Patient tolerance: patient tolerated the procedure well with no immediate complications Comments: Skin flap over blister removed under sterile technique. 1/2 NS used to irrgate wound. Sterile non adherent dressing, gauze and ACE bandage used to cover wound.       Medications Ordered in ED Medications - No data to display  ED Course  I have reviewed the triage vital signs and the nursing notes.  Pertinent labs & imaging results that were available during my care of the patient were reviewed by me and considered in my medical decision making (see chart for details).  Clinical Course as of 05/01/20 1450  Mon May 01, 2020  1344 DG Foot Complete Left IMPRESSION: Multifocal osteoarthritic change. Calcaneal spurs noted. No fracture or dislocation. No erosive change or bony destruction. No soft tissue air evident. [CG]  2557 75 year old female with some type of autoimmune skin disorder which causes her to get  bullae and blisters here with another lesion on her left ankle.  Nontoxic-appearing.  X-ray does not show any bony destruction.  Will broaden antibiotic coverage and see if we can get her into some type of wound clinic. 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  On Keflex for the last 5 days, symptoms improving.  Chills but no fever.  EMR, triage and nursing notes reviewed.  Unable to see urgent care note/visit.  Imaging ordered: Foot x-ray  Labs ordered: N/A.  Medicines ordered: N/A.  Personally visualized and interpreted above labs and imaging   Imaging reveals -foot x-ray does not reveal subcutaneous air, significant subcutaneous edema, obvious bony destruction or osteomyelitis.  Per patient report her symptoms have improved in the last 5 days since being on Keflex.  Wound, as above does not appear to be significantly deep and appears superficial.  Consistent with ruptured blister which she states is typical of her autoimmune skin disorder.  Has never had a lesion that is big however.  No fluctuance, abscess, significant cellulitis.  Afebrile here.  No fevers at home.  Given clinical improvement on Keflex, exam today and no other red flags I do not think further emergent lab work or imaging is necessary today.  Reasonable to defer.  We will switch to broader antibiotic.  Instructed patient to stop using hydrogen peroxide which can delay wound healing.  Also on MTX which could interfere with wound healing. Gave instructions to use clean water/mild soap, nonadherent dressing, Ace bandage to help with swelling, elevation, Tylenol for pain.  I have placed an outpatient referral to wound clinic/RN.  Patient is to have the wound rechecked either by PCP or wound clinic in the next 72 hours.  Return  precautions discussed with patient, she is aware of symptoms that would warrant immediate return to the ED.  Shared with EDP.  Patient in agreement with ER treatment and discharge plan.    Final Clinical Impression(s) / ED Diagnoses Final diagnoses:  Pain  Open wound of left heel, initial encounter    Rx / DC Orders ED Discharge Orders         Ordered    Ambulatory referral to Wound Clinic       Comments: Patient needs outpatient wound clinic/RN evaluation and wound care within 72 hours  At most 1 week, patient lives in [place]   05/01/20 1420    sulfamethoxazole-trimethoprim (BACTRIM DS) 800-160 MG tablet  2 times daily        05/01/20 1421           Kinnie Feil, [place] 05/01/20 1450    Hayden Rasmussen, MD 05/01/20 1801

## 2020-05-01 NOTE — Discharge Instructions (Addendum)
  You were seen in the emergency department for left foot wound  We sent a sample to see if there is a bacteria in the wound (wound culture)  We will change your antibiotic to Bactrim which covers more bacteria.  Take this morning and night until completed.  Stop using hydrogen peroxide.  Instead, use clean water and mild soap.  Wash your wound at least once daily.  After rinsing, placed nonadherent dressing over the wound, wrapped with gauze and covered with brown Ace bandage.  Keep your foot elevated.  You may take Tylenol for pain as needed.  I have put an order for wound clinic to contact you for follow-up hopefully in the next 72 hours, no later than 1 week.  If you do not hear back from the wound clinic or cannot see them in the next 72 hours, please call your primary care doctor and make an appointment for wound check.  Return to the ED for increased pain, redness, pus, fevers or worsening wound despite antibiotics

## 2020-05-01 NOTE — ED Triage Notes (Signed)
States she has a blister on left foot, referred by urgent care

## 2020-05-03 ENCOUNTER — Encounter (HOSPITAL_COMMUNITY): Payer: Self-pay | Admitting: Physical Therapy

## 2020-05-03 ENCOUNTER — Other Ambulatory Visit: Payer: Self-pay

## 2020-05-03 ENCOUNTER — Ambulatory Visit (HOSPITAL_COMMUNITY): Payer: 59 | Attending: Emergency Medicine | Admitting: Physical Therapy

## 2020-05-03 DIAGNOSIS — X58XXXA Exposure to other specified factors, initial encounter: Secondary | ICD-10-CM | POA: Diagnosis not present

## 2020-05-03 DIAGNOSIS — R262 Difficulty in walking, not elsewhere classified: Secondary | ICD-10-CM

## 2020-05-03 DIAGNOSIS — S91302A Unspecified open wound, left foot, initial encounter: Secondary | ICD-10-CM | POA: Diagnosis present

## 2020-05-03 NOTE — Therapy (Signed)
[place], [place], [address] Phone: [phone]   Fax:  [phone]  Wound Care Evaluation  Patient Details  Name: Connie Ponce MRN: [id number] Date of Birth: November 07, 1945 Referring Provider (PT): Kinnie Feil PA-C   Encounter Date: 05/03/2020   PT End of Session - 05/03/20 1018    Visit Number 1    Number of Visits 8    Date for PT Re-Evaluation 05/31/20    Authorization Type united health medicare, no VL, no auth. medicaid secondary    Progress Note Due on Visit 10    PT Start Time 0915    PT Stop Time 0955    PT Time Calculation (min) 40 min    Activity Tolerance Patient tolerated treatment well    Behavior During Therapy [facility] for tasks assessed/performed           Past Medical History:  Diagnosis Date  . Anxiety   . Carpal tunnel syndrome   . COPD (chronic obstructive pulmonary disease) ([place])   . Coronary artery disease   . Gastric ulcer   . GERD (gastroesophageal reflux disease)   . Hyperglycemia   . Hyperlipidemia   . Hypertension   . Hypothyroidism   . Osteoarthritis   . Peptic ulcer disease   . Pneumonia 2010  . Renal insufficiency   . 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 Stroke ([place]) 1990s   mild  . TIA (transient ischemic attack)     Past Surgical History:  Procedure Laterality Date  . ABDOMINAL HYSTERECTOMY    . APPENDECTOMY    . BACK SURGERY    . BONE MARROW ASPIRATION Left 04/22/14  . BONE MARROW BIOPSY Left 04/22/14  . CHOLECYSTECTOMY    . COLONOSCOPY     ?2005, [place]  . COLONOSCOPY N/A 04/07/2015   SWN:IOEVOJ  . COLONOSCOPY WITH ESOPHAGOGASTRODUODENOSCOPY (EGD)  02/11/2012   RMR: Ulcerative/erosive reflux esophagitis, benign appearing gastric ulcer with unremarkable biopsy, no H pylori. Colonoscopy was unremarkable. Next colonoscopy in 2023  . CYSTECTOMY     cyst removed from rectal area.   . ESOPHAGOGASTRODUODENOSCOPY  01/05/2003   RMR: Normal esophagus, small  hiatal hernia/ A couple of tiny antral erosions, otherwise normal stomach normal D1 and D2.   . ESOPHAGOGASTRODUODENOSCOPY N/A 08/11/2014   JKK:XFGHWEX ulcer/HH  . ESOPHAGOGASTRODUODENOSCOPY N/A 12/01/2014   HBZ:[id number] improved gastric ulcerations/p bx  . ESOPHAGOGASTRODUODENOSCOPY N/A 01/31/2016   Procedure: ESOPHAGOGASTRODUODENOSCOPY (EGD);  Surgeon: Daneil Dolin, MD;  [facility];  Service: Endoscopy;  Laterality: N/A;  745  . HEMORRHOID SURGERY    . JOINT REPLACEMENT Right    knee  . MALONEY DILATION N/A 01/31/2016   Procedure: Venia Minks DILATION;  Surgeon: Daneil Dolin, MD;  Location: [facility];  Service: Endoscopy;  Laterality: N/A;    There were no vitals filed for this visit.     [facility] PT Assessment - 05/03/20 0001      Assessment   Medical Diagnosis left heel wound    Referring Provider (PT) Kinnie Feil PA-C           Wound Therapy - 05/03/20 0001    Subjective Patient reports that blister started on the inside of her heel about 2 weeks ago. She went to urgent care and they placed her on antibiotics and they told her to come back in a couple of days. She returned and they sent her to the hospital where they changed the antibiotics  she was on and referred her to wound care. Patient reports she has been getting blisters all over her body for the last 2 years and they don't really know the cause despite seeing multiple dermatologists and a rheumatologist. She has been treating it with vaseoline and keeping it covered. Currently really painful to walk and difficulty to get around.    Patient and Family Stated Goals to have wound to heel, to be able to safely walk    Date of Onset 04/19/20    Prior Treatments vaeoline, kerlix antibiotics    Pain Scale 0-10    Pain Score 8     Pain Location Foot    Pain Orientation Left    Evaluation and Treatment Procedures Explained to Patient/Family Yes    Evaluation and Treatment Procedures agreed to    Wound  Properties Date First Assessed: 05/03/20 Time First Assessed: 0920 Wound Type: Venous stasis ulcer Location: Heel Location Orientation: Left;Posterior Wound Description (Comments): large heel/ankle wound Present on Admission: Yes   Wound Image View All Images View Images    Dressing Type --   vaseoline, kerlix   Dressing Changed Changed    Dressing Status Dry    Dressing Change Frequency PRN    % Wound base Red or Granulating 90%    % Wound base Other/Granulation Tissue (Comment) 10%    Peri-wound Assessment Edema;Erythema (blanchable)   pittign edeam 4+   Wound Length (cm) 8.2 cm    Wound Width (cm) 8.2 cm    Wound Depth (cm) 0 cm    Wound Volume (cm^3) 0 cm^3    Wound Surface Area (cm^2) 67.24 cm^2    Margins Unattached edges (unapproximated)    Drainage Amount None    Treatment Cleansed;Debridement (Selective)    Selective Debridement - Location left heel/ankle    Selective Debridement - Tools Used Forceps;Scalpel    Selective Debridement - Tissue Removed devitalized tissue    Wound Therapy - Clinical Statement Patient with large blister along left medial ankle and heel that developed spontaneously about 2 weeks ago. Patient with known disease that causes blisters all over her body but normally they are much smaller and presents today with 10+ blisters along her left lower leg. Patient had tried Vaseline and keeping wound covered but wound has continued to not heal and is limiting her ability to walk and stand at home requiring assist from her husband. Educated patient in wound care and answered all questions. Dressed wound with hydrogel, xeroform and profore lite. Placed xeroform on other open blisters along left lower leg. Patient would greatly benefit from skilled physical therapy to improve overall function and return her to optimal mobility.    Wound Therapy - Functional Problem List walking, standing, bathing    Factors Delaying/Impairing Wound Healing Vascular compromise;Tobacco  use;Multiple medical problems   heart issues   Wound Therapy - Frequency 2X / week    Wound Therapy - Current Recommendations PT    Wound Plan debride and dress as appropriate, measure for compression garments.    Dressing  hydrogel, xerofrom, profore lite and #5 netting    Dressing xeroform to other blisters                 Objective measurements completed on examination: See above findings.             PT Education - 05/03/20 1021    Education Details on skin care, on compression garments and benefits, on how to doff bandage and when  to doff bandage if need be.    Person(s) Educated Patient;Spouse    Methods Explanation    Comprehension Verbalized understanding            PT Short Term Goals - 05/03/20 1023      PT SHORT TERM GOAL #1   Title Patient will be independent in skin care routing to reduce risk of wounds.    Time 2    Period Weeks    Target Date 05/17/20      PT SHORT TERM GOAL #2   Title Wound will reduce in surface are by at least 50% to demonstrate improved wound healing    Time 2    Period Weeks    Status New    Target Date 05/17/20             PT Long Term Goals - 05/03/20 1026      PT LONG TERM GOAL #1   Title wound will be completly healed to reduce risk of infection    Time 4    Period Weeks    Status New    Target Date 05/31/20      PT LONG TERM GOAL #2   Title Patient will don compression garemtns to reduce risk of swelling and future wounds.    Time 4    Period Weeks    Status New    Target Date 05/31/20      PT LONG TERM GOAL #3   Title Patient will be able to walk without pain in heel to demonstrate improved functional mobility    Time 4    Period Weeks    Status New    Target Date 05/31/20                Plan - 05/03/20 1200    Clinical Impression Statement see above    Personal Factors and Comorbidities Comorbidity 1;Comorbidity 2    Comorbidities COPD, heart disease, hx of vein insufficiency     Examination-Activity Limitations Stand;Stairs;Locomotion Level    Examination-Participation Restrictions Community Activity;Cleaning    Stability/Clinical Decision Making Stable/Uncomplicated    Clinical Decision Making Low    Rehab Potential Good    PT Frequency 2x / week    PT Duration 4 weeks    PT Treatment/Interventions ADLs/Self Care Home Management;Other (comment);Manual techniques   wound care, debridement   PT Next Visit Plan measure for compression garments, dressing changes    PT Home Exercise Plan none at this time    Consulted and Agree with Plan of Care Patient;Family member/caregiver    Family Member Consulted husband - Jeneen Rinks           Patient will benefit from skilled therapeutic intervention in order to improve the following deficits and impairments:  Pain,Decreased skin integrity,Difficulty walking,Other (comment) (wound, swelling)  Visit Diagnosis: Difficulty in walking, not elsewhere classified  Open wound of left heel, initial encounter    Problem List Patient Active Problem List   Diagnosis Date Noted  . Clostridium difficile infection 01/29/2020  . Hypokalemia 01/29/2020  . COPD with acute exacerbation ([place]) 07/21/2019  . Chronic heart failure with preserved ejection fraction (HFpEF) /diastolic dysfunction/EF 84% 07/21/2019  . Nicotine dependence 07/21/2019  . Microcytic anemia 09/23/2018  . Acute gout of left wrist 09/01/2018  . Bacteremia 08/30/2018  . Hypothyroid 08/30/2018  . Itching 08/30/2018  . Rash 08/30/2018  . Spinal stenosis of lumbar region 08/17/2018  . AKI (acute kidney injury) ([place])   . Syncope and  collapse 03/28/2018  . COPD (chronic obstructive pulmonary disease) ([place]) 03/28/2018  . Tobacco abuse 03/28/2018  . Fall at home 03/28/2018  . CKD (chronic kidney disease) stage 3, GFR 30-59 ml/min 03/28/2018  . Anemia in chronic kidney disease (CKD) 03/28/2018  . Hyperlipidemia 03/28/2018  . Acute metabolic encephalopathy 04/88/8916   . Gait instability 10/03/2017  . Dysarthria 10/03/2017  . Essential hypertension 10/03/2017  . Slurred speech   . Abdominal mass, RUQ (right upper quadrant) 03/13/2017  . Abdominal pain 03/13/2017  . RUQ pain 03/13/2017  . Dysphagia   . TIA (transient ischemic attack) 01/03/2016  . Absolute anemia 07/03/2015  . Chronic cystitis 04/21/2015  . Incomplete emptying of bladder 04/21/2015  . Increased frequency of urination 04/21/2015  . SUI (stress urinary incontinence, female) 04/21/2015  . Urge incontinence 04/21/2015  . Proctalgia   . Rectal pain 01/04/2015  . Hemorrhoids 01/04/2015  . Diarrhea 01/04/2015  . Gastric ulceration   . Gastric ulcer 11/29/2014  . Toxic metabolic encephalopathy 94/50/3888  . UTI (urinary tract infection) 09/19/2014  . ARF (acute renal failure) ([place])   . LUQ pain 07/28/2014  . Nausea with vomiting 07/28/2014  . Bilateral lower extremity edema 07/28/2014  . RLQ abdominal pain 01/22/2012  . Constipation 01/22/2012  . GERD (gastroesophageal reflux disease) 01/22/2012   12:20 PM, 05/03/20 Jerene Pitch, DPT Physical Therapy with [facility]  [phone] office  [place] [address] [place], [place], [address] Phone: [phone]   Fax:  [phone]  Name: Connie Ponce MRN: [id number] Date of Birth: 1945/09/29

## 2020-05-04 LAB — AEROBIC CULTURE W GRAM STAIN (SUPERFICIAL SPECIMEN): Gram Stain: NONE SEEN

## 2020-05-05 NOTE — Progress Notes (Signed)
ED Antimicrobial Stewardship Positive Culture Follow Up   Connie Ponce is an 74 y.o. female who presented to [facility] on 05/01/2020 with a chief complaint of evaluation of left heel wound. Wound was irrigated and dressed during ED visit. No systemic signs of infection.   Recent Results (from the past 720 hour(s))  Aerobic Culture w Gram Stain (superficial specimen)     Status: None   Collection Time: 05/01/20  2:35 PM   Specimen: Wound  Result Value Ref Range Status   Specimen Description WOUND  Final   Special Requests NONE  Final   Gram Stain   Final    NO WBC SEEN MODERATE GRAM POSITIVE COCCI Performed at [facility], [address] [place], [place] [address]    Culture   Final    ABUNDANT STAPHYLOCOCCUS AUREUS ABUNDANT ENTEROCOCCUS FAECALIS    Report Status 05/04/2020 FINAL  Final   Organism ID, Bacteria STAPHYLOCOCCUS AUREUS  Final   Organism ID, Bacteria ENTEROCOCCUS FAECALIS  Final      Susceptibility   Enterococcus faecalis - MIC*    AMPICILLIN <=2 SENSITIVE Sensitive     VANCOMYCIN 1 SENSITIVE Sensitive     GENTAMICIN SYNERGY SENSITIVE Sensitive     * ABUNDANT ENTEROCOCCUS FAECALIS   Staphylococcus aureus - MIC*    CIPROFLOXACIN >=8 RESISTANT Resistant     ERYTHROMYCIN >=8 RESISTANT Resistant     GENTAMICIN <=0.5 SENSITIVE Sensitive     OXACILLIN <=0.25 SENSITIVE Sensitive     TETRACYCLINE <=1 SENSITIVE Sensitive     VANCOMYCIN 1 SENSITIVE Sensitive     TRIMETH/SULFA <=10 SENSITIVE Sensitive     CLINDAMYCIN <=0.25 SENSITIVE Sensitive     RIFAMPIN <=0.5 SENSITIVE Sensitive     Inducible Clindamycin NEGATIVE Sensitive     * ABUNDANT STAPHYLOCOCCUS AUREUS    Patient presented for evaluation of left heel wound. Skin was removed from the skin wound, wound was irrigated and dressed using sterile technique. The patient was discharged on Bactrim. The cultures and sensitivities show S. Aureus sensitive to Bactrim and PCN sensitive E. Faecalis. The patient does  not currently have coverage for E. Faecalis.   New antibiotic prescription: Amoxicillin 500 MG PO BID x5 days ED Provider: Fayrene Helper PA-C   Magnus Ivan Marvin Grabill 05/05/2020, 9:53 AM Clinical Pharmacist Monday - Friday phone -  [phone] Saturday - Sunday phone - [phone]

## 2020-05-09 ENCOUNTER — Ambulatory Visit (HOSPITAL_COMMUNITY): Payer: 59

## 2020-05-09 ENCOUNTER — Encounter (HOSPITAL_COMMUNITY): Payer: Self-pay

## 2020-05-09 ENCOUNTER — Other Ambulatory Visit: Payer: Self-pay

## 2020-05-09 DIAGNOSIS — R262 Difficulty in walking, not elsewhere classified: Secondary | ICD-10-CM

## 2020-05-09 DIAGNOSIS — S91302A Unspecified open wound, left foot, initial encounter: Secondary | ICD-10-CM

## 2020-05-09 NOTE — Therapy (Signed)
[place], [place], [address] Phone: [phone]   Fax:  [phone]  Wound Care Therapy  Patient Details  Name: Connie Ponce MRN: [id number] Date of Birth: 1945-08-13 Referring Provider (PT): Kinnie Feil PA-C   Encounter Date: 05/09/2020   PT End of Session - 05/09/20 1104    Visit Number 2    Number of Visits 8    Date for PT Re-Evaluation 05/31/20    Authorization Type united health medicare, no VL, no auth. medicaid secondary    Progress Note Due on Visit 10    PT Start Time 1010    PT Stop Time 1048    PT Time Calculation (min) 38 min    Activity Tolerance Patient tolerated treatment well    Behavior During Therapy [facility] for tasks assessed/performed           Past Medical History:  Diagnosis Date  . Anxiety   . Carpal tunnel syndrome   . COPD (chronic obstructive pulmonary disease) ([place])   . Coronary artery disease   . Gastric ulcer   . GERD (gastroesophageal reflux disease)   . Hyperglycemia   . Hyperlipidemia   . Hypertension   . Hypothyroidism   . Osteoarthritis   . Peptic ulcer disease   . Pneumonia 2010  . Renal insufficiency   . 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 Stroke ([place]) 1990s   mild  . TIA (transient ischemic attack)     Past Surgical History:  Procedure Laterality Date  . ABDOMINAL HYSTERECTOMY    . APPENDECTOMY    . BACK SURGERY    . BONE MARROW ASPIRATION Left 04/22/14  . BONE MARROW BIOPSY Left 04/22/14  . CHOLECYSTECTOMY    . COLONOSCOPY     ?2005, [place]  . COLONOSCOPY N/A 04/07/2015   BTY:[id number]  . COLONOSCOPY WITH ESOPHAGOGASTRODUODENOSCOPY (EGD)  02/11/2012   RMR: Ulcerative/erosive reflux esophagitis, benign appearing gastric ulcer with unremarkable biopsy, no H pylori. Colonoscopy was unremarkable. Next colonoscopy in 2023  . CYSTECTOMY     cyst removed from rectal area.   . ESOPHAGOGASTRODUODENOSCOPY  01/05/2003   RMR: Normal esophagus, small  hiatal hernia/ A couple of tiny antral erosions, otherwise normal stomach normal D1 and D2.   . ESOPHAGOGASTRODUODENOSCOPY N/A 08/11/2014   HTX:HFSFSEL ulcer/HH  . ESOPHAGOGASTRODUODENOSCOPY N/A 12/01/2014   TRV:[id number] improved gastric ulcerations/p bx  . ESOPHAGOGASTRODUODENOSCOPY N/A 01/31/2016   Procedure: ESOPHAGOGASTRODUODENOSCOPY (EGD);  Surgeon: Daneil Dolin, MD;  [facility];  Service: Endoscopy;  Laterality: N/A;  745  . HEMORRHOID SURGERY    . JOINT REPLACEMENT Right    knee  . MALONEY DILATION N/A 01/31/2016   Procedure: Venia Minks DILATION;  Surgeon: Daneil Dolin, MD;  [facility];  Service: Endoscopy;  Laterality: N/A;    There were no vitals filed for this visit.    Subjective Assessment - 05/09/20 1103    Subjective Pt arrived with dressings intact and additional trash bag exterior to keep from getting wet.  Reports comfort wtih compression garments.  Intermittent burning.    Patient is accompained by: Family member   husband   Currently in Pain? Yes    Pain Score 6     Pain Location Foot    Pain Orientation Left    Pain Descriptors / Indicators Burning    Pain Onset 1 to 4 weeks ago    Pain Frequency Intermittent  Wound Therapy - 05/09/20 1103    Subjective Pt arrived with dressings intact and additional trash bag exterior to keep from getting wet.  Reports comfort wtih compression garments.  Intermittent burning    Patient and Family Stated Goals to have wound to heel, to be able to safely walk    Date of Onset 04/19/20    Prior Treatments vaeoline, kerlix antibiotics    Pain Scale 0-10    Evaluation and Treatment Procedures Explained to Patient/Family Yes    Evaluation and Treatment Procedures agreed to    Wound Properties Date First Assessed: 05/03/20 Time First Assessed: 0920 Wound Type: Venous stasis ulcer Location: Heel Location Orientation: Left;Posterior Wound Description (Comments): large  heel/ankle wound Present on Admission: Yes   Wound Image View All Images View Images    Dressing Type Impregnated gauze (bismuth);Gauze (Comment);Compression wrap    Dressing Changed Changed    Dressing Status Dry;Old drainage    Dressing Change Frequency PRN    Site / Wound Assessment Clean;Dry;Red    % Wound base Red or Granulating 100%    Peri-wound Assessment Edema;Erythema (blanchable)    Margins Unattached edges (unapproximated)    Drainage Amount None    Treatment Cleansed;Debridement (Selective)    Selective Debridement - Location left heel/ankle    Selective Debridement - Tools Used Forceps    Selective Debridement - Tissue Removed devitalized tissue; dry skin    Wound Therapy - Clinical Statement Selective debridement for removal of dry skin perimeter with 100% granulation following debridement.  Pt educated on benefits with compression garments for blister and edema control.  Measurements taken and paperwork given for [employer]  Encouraged to purchase every 6 months.  Also shown butler and demonstrated increased ease with donning.    Wound Therapy - Functional Problem List walking, standing, bathing    Factors Delaying/Impairing Wound Healing Vascular compromise;Tobacco use;Multiple medical problems   heart issues   Wound Therapy - Frequency 2X / week    Wound Therapy - Current Recommendations PT    Wound Plan debride and dress as appropriate.  F/U with purchase of compression garment    Dressing  hydrogel, xerofrom, profore lite and #5 netting    Dressing xeroform to other blisters                     PT Short Term Goals - 05/03/20 1023      PT SHORT TERM GOAL #1   Title Patient will be independent in skin care routing to reduce risk of wounds.    Time 2    Period Weeks    Target Date 05/17/20      PT SHORT TERM GOAL #2   Title Wound will reduce in surface are by at least 50% to demonstrate improved wound healing    Time 2    Period Weeks     Status New    Target Date 05/17/20             PT Long Term Goals - 05/03/20 1026      PT LONG TERM GOAL #1   Title wound will be completly healed to reduce risk of infection    Time 4    Period Weeks    Status New    Target Date 05/31/20      PT LONG TERM GOAL #2   Title Patient will don compression garemtns to reduce risk of swelling and future wounds.    Time 4  Period Weeks    Status New    Target Date 05/31/20      PT LONG TERM GOAL #3   Title Patient will be able to walk without pain in heel to demonstrate improved functional mobility    Time 4    Period Weeks    Status New    Target Date 05/31/20                  Patient will benefit from skilled therapeutic intervention in order to improve the following deficits and impairments:     Visit Diagnosis: Open wound of left heel, initial encounter  Difficulty in walking, not elsewhere classified     Problem List Patient Active Problem List   Diagnosis Date Noted  . Clostridium difficile infection 01/29/2020  . Hypokalemia 01/29/2020  . COPD with acute exacerbation ([place]) 07/21/2019  . Chronic heart failure with preserved ejection fraction (HFpEF) /diastolic dysfunction/EF 96% 07/21/2019  . Nicotine dependence 07/21/2019  . Microcytic anemia 09/23/2018  . Acute gout of left wrist 09/01/2018  . Bacteremia 08/30/2018  . Hypothyroid 08/30/2018  . Itching 08/30/2018  . Rash 08/30/2018  . Spinal stenosis of lumbar region 08/17/2018  . AKI (acute kidney injury) ([place])   . Syncope and collapse 03/28/2018  . COPD (chronic obstructive pulmonary disease) ([place]) 03/28/2018  . Tobacco abuse 03/28/2018  . Fall at home 03/28/2018  . CKD (chronic kidney disease) stage 3, GFR 30-59 ml/min 03/28/2018  . Anemia in chronic kidney disease (CKD) 03/28/2018  . Hyperlipidemia 03/28/2018  . Acute metabolic encephalopathy 78/93/8101  . Gait instability 10/03/2017  . Dysarthria 10/03/2017  . Essential hypertension  10/03/2017  . Slurred speech   . Abdominal mass, RUQ (right upper quadrant) 03/13/2017  . Abdominal pain 03/13/2017  . RUQ pain 03/13/2017  . Dysphagia   . TIA (transient ischemic attack) 01/03/2016  . Absolute anemia 07/03/2015  . Chronic cystitis 04/21/2015  . Incomplete emptying of bladder 04/21/2015  . Increased frequency of urination 04/21/2015  . SUI (stress urinary incontinence, female) 04/21/2015  . Urge incontinence 04/21/2015  . Proctalgia   . Rectal pain 01/04/2015  . Hemorrhoids 01/04/2015  . Diarrhea 01/04/2015  . Gastric ulceration   . Gastric ulcer 11/29/2014  . Toxic metabolic encephalopathy 75/12/2583  . UTI (urinary tract infection) 09/19/2014  . ARF (acute renal failure) ([place])   . LUQ pain 07/28/2014  . Nausea with vomiting 07/28/2014  . Bilateral lower extremity edema 07/28/2014  . RLQ abdominal pain 01/22/2012  . Constipation 01/22/2012  . GERD (gastroesophageal reflux disease) 01/22/2012   Ihor Austin, LPTA/CLT; CBIS [phone]  Aldona Lento 05/09/2020, 11:12 AM  [place], [place], [address] Phone: [phone]   Fax:  [phone]  Name: Connie Ponce MRN: [id number] Date of Birth: 02/12/1946

## 2020-05-11 ENCOUNTER — Other Ambulatory Visit: Payer: Self-pay

## 2020-05-11 ENCOUNTER — Ambulatory Visit (HOSPITAL_COMMUNITY): Payer: 59

## 2020-05-11 DIAGNOSIS — R262 Difficulty in walking, not elsewhere classified: Secondary | ICD-10-CM | POA: Diagnosis not present

## 2020-05-11 DIAGNOSIS — S91302A Unspecified open wound, left foot, initial encounter: Secondary | ICD-10-CM

## 2020-05-11 NOTE — Therapy (Addendum)
[place], [place], [address] Phone: [phone]   Fax:  [phone]  Wound Care Therapy and Discharge Note  Patient Details  Name: Connie Ponce MRN: [id number] Date of Birth: 08-03-1945 Referring Provider (PT): Kinnie Feil PA-C  PHYSICAL THERAPY DISCHARGE SUMMARY  Visits from Start of Care: 3  Current functional level related to goals / functional outcomes: All but one goal met at this time   Remaining deficits: See below   Education / Equipment: See below Plan: Patient agrees to discharge.  Patient goals were partially met. Patient is being discharged due to being pleased with the current functional level.  ?????     1:50 PM, 05/11/20 Jerene Pitch, DPT Physical Therapy with [facility]  [phone] office   Encounter Date: 05/11/2020   PT End of Session - 05/11/20 1056    Visit Number 3    Number of Visits 8    Date for PT Re-Evaluation 05/31/20    Authorization Type united health medicare, no VL, no auth. medicaid secondary    Progress Note Due on Visit 10    PT Start Time 1015   bathroom break   PT Stop Time 1040    PT Time Calculation (min) 25 min    Activity Tolerance Patient tolerated treatment well    Behavior During Therapy [facility] for tasks assessed/performed           Past Medical History:  Diagnosis Date  . Anxiety   . Carpal tunnel syndrome   . COPD (chronic obstructive pulmonary disease) ([place])   . Coronary artery disease   . Gastric ulcer   . GERD (gastroesophageal reflux disease)   . Hyperglycemia   . Hyperlipidemia   . Hypertension   . Hypothyroidism   . Osteoarthritis   . Peptic ulcer disease   . Pneumonia 2010  . Renal insufficiency   . 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 Stroke ([place]) 1990s   mild  . TIA (transient ischemic attack)     Past Surgical History:  Procedure Laterality Date  . ABDOMINAL HYSTERECTOMY    . APPENDECTOMY    .  BACK SURGERY    . BONE MARROW ASPIRATION Left 04/22/14  . BONE MARROW BIOPSY Left 04/22/14  . CHOLECYSTECTOMY    . COLONOSCOPY     ?2005, [place]  . COLONOSCOPY N/A 04/07/2015   BFX:OVANVB  . COLONOSCOPY WITH ESOPHAGOGASTRODUODENOSCOPY (EGD)  02/11/2012   RMR: Ulcerative/erosive reflux esophagitis, benign appearing gastric ulcer with unremarkable biopsy, no H pylori. Colonoscopy was unremarkable. Next colonoscopy in 2023  . CYSTECTOMY     cyst removed from rectal area.   . ESOPHAGOGASTRODUODENOSCOPY  01/05/2003   RMR: Normal esophagus, small hiatal hernia/ A couple of tiny antral erosions, otherwise normal stomach normal D1 and D2.   . ESOPHAGOGASTRODUODENOSCOPY N/A 08/11/2014   TYO:MAYOKHT ulcer/HH  . ESOPHAGOGASTRODUODENOSCOPY N/A 12/01/2014   XHF:[id number] improved gastric ulcerations/p bx  . ESOPHAGOGASTRODUODENOSCOPY N/A 01/31/2016   Procedure: ESOPHAGOGASTRODUODENOSCOPY (EGD);  Surgeon: Daneil Dolin, MD;  [facility];  Service: Endoscopy;  Laterality: N/A;  745  . HEMORRHOID SURGERY    . JOINT REPLACEMENT Right    knee  . MALONEY DILATION N/A 01/31/2016   Procedure: Venia Minks DILATION;  Surgeon: Daneil Dolin, MD;  Location: [facility] ENDO SUITE;  Service: Endoscopy;  Laterality: N/A;    There were no vitals filed for this visit.    Subjective Assessment - 05/11/20 1049  Subjective Pt arrived with dressings intact and additional trash bag exterior to keep from getting wet.  Reports comfort wtih compression garments.  Stated she has not called about compression garments yet, would like to complete during session.    Patient is accompained by: Family member   Granddaughter   Currently in Pain? Yes    Pain Score 6     Pain Location Foot    Pain Orientation Left    Pain Descriptors / Indicators Burning    Pain Onset 1 to 4 weeks ago    Pain Frequency Intermittent                     Wound Therapy - 05/11/20 1049    Subjective Pt arrived with  dressings intact and additional trash bag exterior to keep from getting wet.  Reports comfort wtih compression garments.  Stated she has not called about compression garments yet, would like to complete during session.    Patient and Family Stated Goals to have wound to heel, to be able to safely walk    Date of Onset 04/19/20    Prior Treatments vaeoline, kerlix antibiotics    Pain Scale 0-10    Evaluation and Treatment Procedures Explained to Patient/Family Yes    Evaluation and Treatment Procedures agreed to    Wound Properties Date First Assessed: 05/03/20 Time First Assessed: 0920 Wound Type: Venous stasis ulcer Location: Heel Location Orientation: Left;Posterior Wound Description (Comments): large heel/ankle wound Present on Admission: Yes   Dressing Type Impregnated gauze (bismuth);Compression wrap   vaseline and profore lite   Dressing Changed Changed    Dressing Status Dry    Dressing Change Frequency PRN    Site / Wound Assessment Granulation tissue    % Wound base Red or Granulating 100%    Treatment Cleansed;Other (Comment)    Selective Debridement - Location No debridement necessary    Wound Therapy - Clinical Statement Wound healed upon removal of dressings.  LE cleansed and well moisturized.  LE remeasured and granddaughter attempted to call, left message at [place] given wiht measurements.  Educated pt on importance of wearing compression garment daily to reduce return of blisters. Did re-wrap with profore lite for prevention of blister returns, pt educated can wear profore for a week than remove safely.  Verbalized understanding.    Wound Therapy - Functional Problem List walking, standing, bathing    Factors Delaying/Impairing Wound Healing Vascular compromise;Tobacco use;Multiple medical problems   heart issues   Wound Therapy - Frequency 2X / week    Wound Therapy - Current Recommendations PT    Wound Plan DC to self care.                      PT Short Term Goals - 05/03/20 1023      PT SHORT TERM GOAL #1   Title Patient will be independent in skin care routing to reduce risk of wounds.    Time 2    Period Weeks    Target Date 05/17/20      PT SHORT TERM GOAL #2   Title Wound will reduce in surface are by at least 50% to demonstrate improved wound healing    Time 2    Period Weeks    Status New    Target Date 05/17/20             PT Long Term Goals - 05/03/20 1026  PT LONG TERM GOAL #1   Title wound will be completly healed to reduce risk of infection    Time 4    Period Weeks    Status New    Target Date 05/31/20      PT LONG TERM GOAL #2   Title Patient will don compression garemtns to reduce risk of swelling and future wounds.    Time 4    Period Weeks    Status New    Target Date 05/31/20      PT LONG TERM GOAL #3   Title Patient will be able to walk without pain in heel to demonstrate improved functional mobility    Time 4    Period Weeks    Status New    Target Date 05/31/20                  Patient will benefit from skilled therapeutic intervention in order to improve the following deficits and impairments:     Visit Diagnosis: Open wound of left heel, initial encounter  Difficulty in walking, not elsewhere classified     Problem List Patient Active Problem List   Diagnosis Date Noted  . Clostridium difficile infection 01/29/2020  . Hypokalemia 01/29/2020  . COPD with acute exacerbation ([place]) 07/21/2019  . Chronic heart failure with preserved ejection fraction (HFpEF) /diastolic dysfunction/EF 61% 07/21/2019  . Nicotine dependence 07/21/2019  . Microcytic anemia 09/23/2018  . Acute gout of left wrist 09/01/2018  . Bacteremia 08/30/2018  . Hypothyroid 08/30/2018  . Itching 08/30/2018  . Rash 08/30/2018  . Spinal stenosis of lumbar region 08/17/2018  . AKI (acute kidney injury) ([place])   . Syncope and collapse 03/28/2018  . COPD (chronic  obstructive pulmonary disease) ([place]) 03/28/2018  . Tobacco abuse 03/28/2018  . Fall at home 03/28/2018  . CKD (chronic kidney disease) stage 3, GFR 30-59 ml/min 03/28/2018  . Anemia in chronic kidney disease (CKD) 03/28/2018  . Hyperlipidemia 03/28/2018  . Acute metabolic encephalopathy 60/73/7106  . Gait instability 10/03/2017  . Dysarthria 10/03/2017  . Essential hypertension 10/03/2017  . Slurred speech   . Abdominal mass, RUQ (right upper quadrant) 03/13/2017  . Abdominal pain 03/13/2017  . RUQ pain 03/13/2017  . Dysphagia   . TIA (transient ischemic attack) 01/03/2016  . Absolute anemia 07/03/2015  . Chronic cystitis 04/21/2015  . Incomplete emptying of bladder 04/21/2015  . Increased frequency of urination 04/21/2015  . SUI (stress urinary incontinence, female) 04/21/2015  . Urge incontinence 04/21/2015  . Proctalgia   . Rectal pain 01/04/2015  . Hemorrhoids 01/04/2015  . Diarrhea 01/04/2015  . Gastric ulceration   . Gastric ulcer 11/29/2014  . Toxic metabolic encephalopathy 26/94/8546  . UTI (urinary tract infection) 09/19/2014  . ARF (acute renal failure) ([place])   . LUQ pain 07/28/2014  . Nausea with vomiting 07/28/2014  . Bilateral lower extremity edema 07/28/2014  . RLQ abdominal pain 01/22/2012  . Constipation 01/22/2012  . GERD (gastroesophageal reflux disease) 01/22/2012   Ihor Austin, LPTA/CLT; CBIS [phone]  Aldona Lento 05/11/2020, 10:57 AM  [place], [place], [address] Phone: [phone]   Fax:  [phone]  Name: Connie Ponce MRN: [id number] Date of Birth: June 16, 1945

## 2020-05-12 ENCOUNTER — Emergency Department (HOSPITAL_COMMUNITY): Payer: 59

## 2020-05-12 ENCOUNTER — Emergency Department (HOSPITAL_COMMUNITY)
Admission: EM | Admit: 2020-05-12 | Discharge: 2020-05-12 | Disposition: A | Discharge status: Home / Self Care | Payer: 59 | Attending: Emergency Medicine | Admitting: Emergency Medicine

## 2020-05-12 ENCOUNTER — Other Ambulatory Visit: Payer: Self-pay

## 2020-05-12 ENCOUNTER — Encounter (HOSPITAL_COMMUNITY): Payer: Self-pay | Admitting: *Deleted

## 2020-05-12 DIAGNOSIS — N183 Chronic kidney disease, stage 3 unspecified: Secondary | ICD-10-CM | POA: Diagnosis not present

## 2020-05-12 DIAGNOSIS — I130 Hypertensive heart and chronic kidney disease with heart failure and stage 1 through stage 4 chronic kidney disease, or unspecified chronic kidney disease: Secondary | ICD-10-CM | POA: Insufficient documentation

## 2020-05-12 DIAGNOSIS — I509 Heart failure, unspecified: Secondary | ICD-10-CM | POA: Diagnosis not present

## 2020-05-12 DIAGNOSIS — K8689 Other specified diseases of pancreas: Secondary | ICD-10-CM | POA: Diagnosis not present

## 2020-05-12 DIAGNOSIS — R1013 Epigastric pain: Secondary | ICD-10-CM | POA: Diagnosis present

## 2020-05-12 DIAGNOSIS — R197 Diarrhea, unspecified: Secondary | ICD-10-CM | POA: Diagnosis not present

## 2020-05-12 DIAGNOSIS — K6289 Other specified diseases of anus and rectum: Secondary | ICD-10-CM | POA: Diagnosis not present

## 2020-05-12 DIAGNOSIS — K219 Gastro-esophageal reflux disease without esophagitis: Secondary | ICD-10-CM | POA: Insufficient documentation

## 2020-05-12 DIAGNOSIS — I2510 Atherosclerotic heart disease of native coronary artery without angina pectoris: Secondary | ICD-10-CM | POA: Diagnosis not present

## 2020-05-12 DIAGNOSIS — Z96651 Presence of right artificial knee joint: Secondary | ICD-10-CM | POA: Insufficient documentation

## 2020-05-12 DIAGNOSIS — J441 Chronic obstructive pulmonary disease with (acute) exacerbation: Secondary | ICD-10-CM | POA: Diagnosis not present

## 2020-05-12 DIAGNOSIS — E039 Hypothyroidism, unspecified: Secondary | ICD-10-CM | POA: Insufficient documentation

## 2020-05-12 DIAGNOSIS — F17210 Nicotine dependence, cigarettes, uncomplicated: Secondary | ICD-10-CM | POA: Insufficient documentation

## 2020-05-12 DIAGNOSIS — R112 Nausea with vomiting, unspecified: Secondary | ICD-10-CM | POA: Diagnosis not present

## 2020-05-12 LAB — HEPATIC FUNCTION PANEL
ALT: 23 U/L (ref 0–44)
AST: 32 U/L (ref 15–41)
Albumin: 3.1 g/dL — ABNORMAL LOW (ref 3.5–5.0)
Alkaline Phosphatase: 77 U/L (ref 38–126)
Bilirubin, Direct: 0.1 mg/dL (ref 0.0–0.2)
Indirect Bilirubin: 0.4 mg/dL (ref 0.3–0.9)
Total Bilirubin: 0.5 mg/dL (ref 0.3–1.2)
Total Protein: 6.3 g/dL — ABNORMAL LOW (ref 6.5–8.1)

## 2020-05-12 LAB — LIPASE, BLOOD: Lipase: 24 U/L (ref 11–51)

## 2020-05-12 LAB — CBC
HCT: 36.5 % (ref 36.0–46.0)
Hemoglobin: 12.1 g/dL (ref 12.0–15.0)
MCH: 33.5 pg (ref 26.0–34.0)
MCHC: 33.2 g/dL (ref 30.0–36.0)
MCV: 101.1 fL — ABNORMAL HIGH (ref 80.0–100.0)
Platelets: 211 10*3/uL (ref 150–400)
RBC: 3.61 MIL/uL — ABNORMAL LOW (ref 3.87–5.11)
RDW: 15.4 % (ref 11.5–15.5)
WBC: 7.8 10*3/uL (ref 4.0–10.5)
nRBC: 0 % (ref 0.0–0.2)

## 2020-05-12 LAB — BASIC METABOLIC PANEL
Anion gap: 12 (ref 5–15)
BUN: 20 mg/dL (ref 8–23)
CO2: 22 mmol/L (ref 22–32)
Calcium: 10.4 mg/dL — ABNORMAL HIGH (ref 8.9–10.3)
Chloride: 104 mmol/L (ref 98–111)
Creatinine, Ser: 0.89 mg/dL (ref 0.44–1.00)
GFR, Estimated: >60 mL/min (ref >60)
Glucose, Bld: 97 mg/dL (ref 70–99)
Potassium: 3.2 mmol/L — ABNORMAL LOW (ref 3.5–5.1)
Sodium: 138 mmol/L (ref 135–145)

## 2020-05-12 LAB — TROPONIN I (HIGH SENSITIVITY)
Troponin I (High Sensitivity): 9 ng/L (ref <18)
Troponin I (High Sensitivity): 9 ng/L (ref <18)

## 2020-05-12 MED ORDER — SODIUM CHLORIDE 0.9 % IV BOLUS
500.0000 mL | Freq: Once | INTRAVENOUS | Status: AC
  Administered 2020-05-12: 500 mL via INTRAVENOUS

## 2020-05-12 MED ORDER — IOHEXOL 300 MG/ML  SOLN
100.0000 mL | Freq: Once | INTRAMUSCULAR | Status: AC | PRN
  Administered 2020-05-12: 100 mL via INTRAVENOUS

## 2020-05-12 MED ORDER — ONDANSETRON 4 MG PO TBDP: 4.0000 mg | ORAL_TABLET | Freq: Three times a day (TID) | ORAL | 0 refills | Status: DC | PRN

## 2020-05-12 MED ORDER — ONDANSETRON HCL 4 MG/2ML IJ SOLN
4.0000 mg | Freq: Once | INTRAMUSCULAR | Status: AC
  Administered 2020-05-12: 4 mg via INTRAVENOUS
  Filled 2020-05-12: qty 2

## 2020-05-12 MED ORDER — FENTANYL CITRATE (PF) 100 MCG/2ML IJ SOLN
50.0000 ug | Freq: Once | INTRAMUSCULAR | Status: AC
Start: 2020-05-12 — End: 2020-05-12
  Administered 2020-05-12: 50 ug via INTRAVENOUS
  Filled 2020-05-12: qty 2

## 2020-05-12 NOTE — ED Notes (Signed)
Patient asking for water to drink at this time.Marland Kitchen let patient and family member know that I would check with RN. Patient made aware urine specimen still needed.

## 2020-05-12 NOTE — ED Triage Notes (Signed)
Nausea and vomiting onset yesterday

## 2020-05-12 NOTE — Discharge Instructions (Signed)
You were seen in the ED today with abdominal pain and vomiting. I am discharging you home with nausea medication. Please call Dr. Jena Gauss, your GI doctor, on Monday to discuss your ED visit. You have a thickened area around the rectum and a mass on the pancreas that need follow up in the next week.  Return to the emergency department any new or suddenly worsening symptoms such as trouble breathing, worsening abdominal pain, uncontrolled vomiting.

## 2020-05-12 NOTE — ED Provider Notes (Signed)
Emergency Department Provider Note   I have reviewed the triage vital signs and the nursing notes.   HISTORY  Chief Complaint Vomiting   HPI Connie Ponce is a 75 y.o. female with PMH reviewed below presents to the ED with abdominal pain, nausea, and vomiting/diarrhea. Symptoms began yesterday and have been persistent.  She denies any chest pain or shortness of breath.  No fevers or chills.  No sick contacts.  No dysuria, hesitancy, urgency.  Pain is mainly in the epigastric and left upper quadrant region and w/o radiation. No other modifying factors. She has been unable to keep anything down. Denies any blood in the emesis or stool.   Past Medical History:  Diagnosis Date  . Anxiety   . Carpal tunnel syndrome   . COPD (chronic obstructive pulmonary disease) ([place])   . Coronary artery disease   . Gastric ulcer   . GERD (gastroesophageal reflux disease)   . Hyperglycemia   . Hyperlipidemia   . Hypertension   . Hypothyroidism   . Osteoarthritis   . Peptic ulcer disease   . Pneumonia 2010  . Renal insufficiency   . 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 Stroke ([place]) 1990s   mild  . TIA (transient ischemic attack)     Patient Active Problem List   Diagnosis Date Noted  . Clostridium difficile infection 01/29/2020  . Hypokalemia 01/29/2020  . COPD with acute exacerbation ([place]) 07/21/2019  . Chronic heart failure with preserved ejection fraction (HFpEF) /diastolic dysfunction/EF 37% 07/21/2019  . Nicotine dependence 07/21/2019  . Microcytic anemia 09/23/2018  . Acute gout of left wrist 09/01/2018  . Bacteremia 08/30/2018  . Hypothyroid 08/30/2018  . Itching 08/30/2018  . Rash 08/30/2018  . Spinal stenosis of lumbar region 08/17/2018  . AKI (acute kidney injury) ([place])   . Syncope and collapse 03/28/2018  . COPD (chronic obstructive pulmonary disease) ([place]) 03/28/2018  . Tobacco abuse 03/28/2018  . Fall at home 03/28/2018  . CKD (chronic kidney disease) stage 3, GFR  30-59 ml/min 03/28/2018  . Anemia in chronic kidney disease (CKD) 03/28/2018  . Hyperlipidemia 03/28/2018  . Acute metabolic encephalopathy 48/27/0786  . Gait instability 10/03/2017  . Dysarthria 10/03/2017  . Essential hypertension 10/03/2017  . Slurred speech   . Abdominal mass, RUQ (right upper quadrant) 03/13/2017  . Abdominal pain 03/13/2017  . RUQ pain 03/13/2017  . Dysphagia   . TIA (transient ischemic attack) 01/03/2016  . Absolute anemia 07/03/2015  . Chronic cystitis 04/21/2015  . Incomplete emptying of bladder 04/21/2015  . Increased frequency of urination 04/21/2015  . SUI (stress urinary incontinence, female) 04/21/2015  . Urge incontinence 04/21/2015  . Proctalgia   . Rectal pain 01/04/2015  . Hemorrhoids 01/04/2015  . Diarrhea 01/04/2015  . Gastric ulceration   . Gastric ulcer 11/29/2014  . Toxic metabolic encephalopathy 75/44/9201  . UTI (urinary tract infection) 09/19/2014  . ARF (acute renal failure) ([place])   . LUQ pain 07/28/2014  . Nausea with vomiting 07/28/2014  . Bilateral lower extremity edema 07/28/2014  . RLQ abdominal pain 01/22/2012  . Constipation 01/22/2012  . GERD (gastroesophageal reflux disease) 01/22/2012    Past Surgical History:  Procedure Laterality Date  . ABDOMINAL HYSTERECTOMY    . APPENDECTOMY    . BACK SURGERY    . BONE MARROW ASPIRATION Left 04/22/14  . BONE MARROW BIOPSY Left 04/22/14  . CHOLECYSTECTOMY    . COLONOSCOPY     ?2005, [facility]  . COLONOSCOPY N/A 04/07/2015  BJY:NWGNFA  . COLONOSCOPY WITH ESOPHAGOGASTRODUODENOSCOPY (EGD)  02/11/2012   RMR: Ulcerative/erosive reflux esophagitis, benign appearing gastric ulcer with unremarkable biopsy, no H pylori. Colonoscopy was unremarkable. Next colonoscopy in 2023  . CYSTECTOMY     cyst removed from rectal area.   . ESOPHAGOGASTRODUODENOSCOPY  01/05/2003   RMR: Normal esophagus, small hiatal hernia/ A couple of tiny antral erosions, otherwise normal stomach normal D1  and D2.   . ESOPHAGOGASTRODUODENOSCOPY N/A 08/11/2014   OZH:YQMVHQI ulcer/HH  . ESOPHAGOGASTRODUODENOSCOPY N/A 12/01/2014   ONG:[id number] improved gastric ulcerations/p bx  . ESOPHAGOGASTRODUODENOSCOPY N/A 01/31/2016   Procedure: ESOPHAGOGASTRODUODENOSCOPY (EGD);  Surgeon: Daneil Dolin, MD;  [facility];  Service: Endoscopy;  Laterality: N/A;  745  . HEMORRHOID SURGERY    . JOINT REPLACEMENT Right    knee  . MALONEY DILATION N/A 01/31/2016   Procedure: Venia Minks DILATION;  Surgeon: Daneil Dolin, MD;  Location: [facility];  Service: Endoscopy;  Laterality: N/A;    Allergies Tetracyclines & related and Shellfish-derived products  Family History  Problem Relation Age of Onset  . Other Mother        died age 8, natural causes  . Stroke Mother   . Diabetes Daughter   . Healthy Son   . Healthy Daughter   . Healthy Daughter   . Colon cancer Neg Hx   . Liver disease Neg Hx     Social History Social History   Tobacco Use  . Smoking status: Current Every Day Smoker    Packs/day: 1.00    Years: 58.00    Pack years: 58.00    Types: Cigarettes  . Smokeless tobacco: Never Used  Vaping Use  . Vaping Use: Never used  Substance Use Topics  . Alcohol use: No    Alcohol/week: 0.0 standard drinks  . Drug use: No    Review of Systems  Constitutional: No fever/chills Eyes: No visual changes. ENT: No sore throat. Cardiovascular: Denies chest pain. Respiratory: Denies shortness of breath. Gastrointestinal: Positive abdominal pain. Positive nausea, vomiting, and diarrhea.  No constipation. Genitourinary: Negative for dysuria. Musculoskeletal: Negative for back pain. Skin: Negative for rash. Neurological: Negative for headaches, focal weakness or numbness.  10-point ROS otherwise negative.  ____________________________________________   PHYSICAL EXAM:  VITAL SIGNS: ED Triage Vitals  Enc Vitals Group     BP 05/12/20 1348 134/64     Pulse Rate 05/12/20  1348 (!) 104     Resp 05/12/20 1348 20     Temp 05/12/20 1348 98 F (36.7 C)     Temp Source 05/12/20 1348 Oral     SpO2 05/12/20 1348 97 %   Constitutional: Alert and oriented. Well appearing and in no acute distress. Eyes: Conjunctivae are normal.  Head: Atraumatic. Nose: No congestion/rhinnorhea. Mouth/Throat: Mucous membranes are moist.   Neck: No stridor.   Cardiovascular: Normal rate, regular rhythm. Good peripheral circulation. Grossly normal heart sounds.   Respiratory: Normal respiratory effort.  No retractions. Lungs CTAB. Gastrointestinal: Soft with epigastric and LUQ tenderness. No rebound or guarding. No distention. Rectal exam performed with patient's verbal consent and NT chaperone. No external lesions, masses, or visible fistula.  Musculoskeletal: No lower extremity tenderness nor edema.  Neurologic:  Normal speech and language.  Skin:  Skin is warm, dry and intact. No rash noted.   ____________________________________________   LABS (all labs ordered are listed, but only abnormal results are displayed)  Labs Reviewed  BASIC METABOLIC PANEL - Abnormal; Notable for the following components:  Result Value   Potassium 3.2 (*)    Calcium 10.4 (*)    All other components within normal limits  CBC - Abnormal; Notable for the following components:   RBC 3.61 (*)    MCV 101.1 (*)    All other components within normal limits  HEPATIC FUNCTION PANEL - Abnormal; Notable for the following components:   Total Protein 6.3 (*)    Albumin 3.1 (*)    All other components within normal limits  URINE CULTURE  LIPASE, BLOOD  URINALYSIS, ROUTINE W REFLEX MICROSCOPIC  TROPONIN I (HIGH SENSITIVITY)  TROPONIN I (HIGH SENSITIVITY)   ____________________________________________  EKG   EKG Interpretation  Date/Time:  Friday May 12 2020 13:44:37 EST Ventricular Rate:  106 PR Interval:  144 QRS Duration: 112 QT Interval:  338 QTC Calculation: 448 R  Axis:   -5 Text Interpretation: Sinus tachycardia with Fusion complexes Low voltage QRS Cannot rule out Anterior infarct , age undetermined Abnormal ECG No significant change since last tracing Confirmed by Nanda Quinton [phone]) on 05/12/2020 6:30:28 PM       ____________________________________________  RADIOLOGY  DG Chest 2 View  Result Date: 05/12/2020 CLINICAL DATA:  Chest pain. EXAM: CHEST - 2 VIEW COMPARISON:  January 29, 2020. FINDINGS: The heart size and mediastinal contours are within normal limits. Both lungs are clear. No pneumothorax or pleural effusion is noted. The visualized skeletal structures are unremarkable. IMPRESSION: No active cardiopulmonary disease. Electronically Signed   By: Marijo Conception M.D.   On: 05/12/2020 14:28   CT ABDOMEN PELVIS W CONTRAST  Result Date: 05/12/2020 CLINICAL DATA:  Nausea vomiting abdominal pain EXAM: CT ABDOMEN AND PELVIS WITH CONTRAST TECHNIQUE: Multidetector CT imaging of the abdomen and pelvis was performed using the standard protocol following bolus administration of intravenous contrast. CONTRAST:  123m OMNIPAQUE IOHEXOL 300 MG/ML  SOLN COMPARISON:  CT 01/25/2020 03/26/2017 FINDINGS: Lower chest: Status post cholecystectomy. Hepatobiliary: No focal hepatic abnormality. Stable mild enlargement of extrahepatic common bile. Pancreas: No acute inflammatory change. Progressed fatty atrophy of the proximal pancreas with sparing of the tail. Mild dilatation of pancreatic duct at the tail, suspicion of possible hypoenhancing lesion within the tail of the pancreas Spleen: Normal in size without focal abnormality. Adrenals/Urinary Tract: Adrenal glands are normal. Kidneys show no hydronephrosis. The bladder is unremarkable. Stomach/Bowel: Stomach nonenlarged. No dilated small bowel. No acute bowel wall thickening. Mild left colon diverticular disease. Mild anal rectal circumferential soft tissue thickening. Suspected fistula on the left side without  drainable abscess or gas within the fistula tract. Vascular/Lymphatic: Nonaneurysmal aorta. Advanced aortic atherosclerosis. No suspicious adenopathy. Reproductive: Status post hysterectomy.  No adnexal mass Other: Negative for free air or free fluid. Small fat containing umbilical hernia. Musculoskeletal: Degenerative changes of the spine. No acute or suspicious osseous abnormality. 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 IMPRESSION: 1. Negative for bowel obstruction or bowel wall thickening. 2. Circumferential thickening of the anal rectal region with mild soft tissue stranding/inflammation, question proctocolitis versus mass. 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 Recommend correlation with direct inspection. 3. Progressive fatty atrophy of the pancreatic head neck and body. Sparing of pancreatic tail with ductal dilatation at the tail of the pancreas. Suspicion of vague hypoenhancing mass/possible neoplasm within the pancreatic tail. When the patient is clinically stable and able to follow directions and hold their breath (preferably as an outpatient) further evaluation with dedicated abdominal MRI/MRCP should be considered. Aortic Atherosclerosis (ICD10-I70.0). Electronically  Signed   By: Donavan Foil M.D.   On: 05/12/2020 21:19    ____________________________________________   PROCEDURES  Procedure(s) performed:   Procedures  None  ____________________________________________   INITIAL IMPRESSION / ASSESSMENT AND PLAN / ED COURSE  Pertinent labs & imaging results that were available during my care of the patient were reviewed by me and considered in my medical decision making (see chart for details).   Patient presents to the emergency department with nausea, vomiting, diarrhea along with epigastric and left upper quadrant abdominal pain. Patient with mainly epigastric and LUQ tenderness. Differential includes  pancreatitis, gastritis, cholecystitis. No diarrhea. No fever. Doubt atypical ACS.   Labs reviewed and overall reassuring. No leukocytosis or anemia. Lipase normal. LFTs and bilirubin are normal. On reassessment the patient is sitting up in bed and reports feeling "much better." No vomiting. Tolerating PO.   CT reviewed. Circumferential rectal thickening noted. No visible fistula on exam. Doubt infectious process clinically. Hold on abx. Will have her f/u with Dr. Gala Romney to follow up on this. Provided in writing as well. Also noted to have a possible pancreatic lesion. Discussed this as well. Patient to discuss with GI regarding this also. Contact info and diagnosis provided in AVS.   ____________________________________________  FINAL CLINICAL IMPRESSION(S) / ED DIAGNOSES  Final diagnoses:  Epigastric pain  Non-intractable vomiting with nausea, unspecified vomiting type  Rectal mass  Pancreatic mass    MEDICATIONS GIVEN DURING THIS VISIT:  Medications  sodium chloride 0.9 % bolus 500 mL (0 mLs Intravenous Stopped 05/12/20 2255)  fentaNYL (SUBLIMAZE) injection 50 mcg (50 mcg Intravenous Given 05/12/20 1849)  ondansetron (ZOFRAN) injection 4 mg (4 mg Intravenous Given 05/12/20 1849)  iohexol (OMNIPAQUE) 300 MG/ML solution 100 mL (100 mLs Intravenous Contrast Given 05/12/20 2035)    Note:  This document was prepared using Dragon voice recognition software and may include unintentional dictation errors.  Nanda Quinton, MD, [facility] Emergency [facility]    Jossilyn Benda, [facility], MD 05/12/20 [phone]

## 2020-05-15 ENCOUNTER — Ambulatory Visit (HOSPITAL_COMMUNITY): Payer: 59 | Admitting: Physical Therapy

## 2020-05-17 ENCOUNTER — Ambulatory Visit (HOSPITAL_COMMUNITY): Payer: 59 | Admitting: Physical Therapy

## 2020-05-23 ENCOUNTER — Ambulatory Visit (HOSPITAL_COMMUNITY): Payer: 59 | Admitting: Physical Therapy

## 2020-05-25 ENCOUNTER — Ambulatory Visit (HOSPITAL_COMMUNITY): Payer: 59

## 2020-05-29 ENCOUNTER — Ambulatory Visit (HOSPITAL_COMMUNITY): Payer: 59 | Admitting: Physical Therapy

## 2020-05-31 ENCOUNTER — Ambulatory Visit (HOSPITAL_COMMUNITY): Payer: 59 | Admitting: Physical Therapy

## 2020-05-31 IMAGING — CT CT CERVICAL SPINE W/O CM
4 of 7 series · 16 of 33 positions shown, 18 images · non-contrast
Comparison: CT neck dated February 20, 2018. CT head dated October 03, 2017. MRI cervical spine dated January 03, 2016.

CLINICAL DATA: Syncopal episode last night. Patient denies headache
or neck pain.

EXAM:
CT HEAD WITHOUT CONTRAST
CT CERVICAL SPINE WITHOUT CONTRAST
TECHNIQUE: Multidetector CT imaging of the head and cervical spine was
performed following the standard protocol without intravenous
contrast. Multiplanar CT image reconstructions of the cervical spine
were also generated.

[Series 3: head wo · axial · 0.46mm/px · z∈[+58,+108]mm · 2 of 31 slices shown]
[im 11/31  bone]
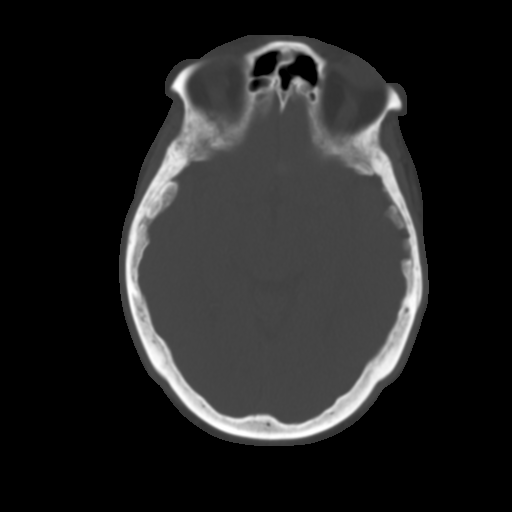
[im 21/31  bone]
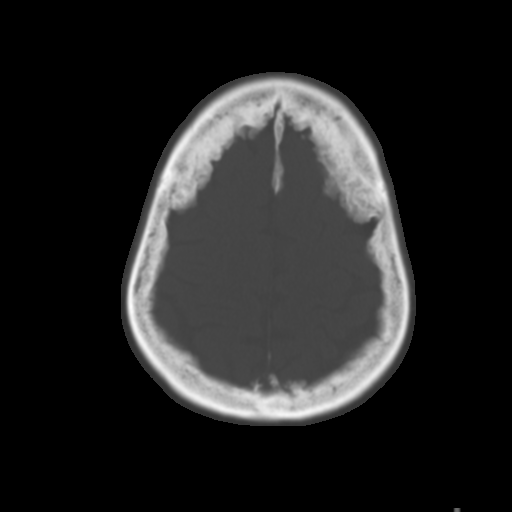

[Series 8: c spine soft · axial · 0.41mm/px · z∈[-137,-1]mm · 8 of 88 slices shown, 10 images]
[im 10/88  soft-tissue]
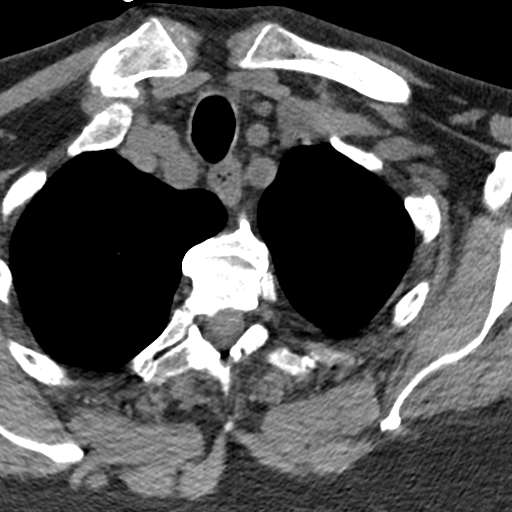
[im 10/88  bone]
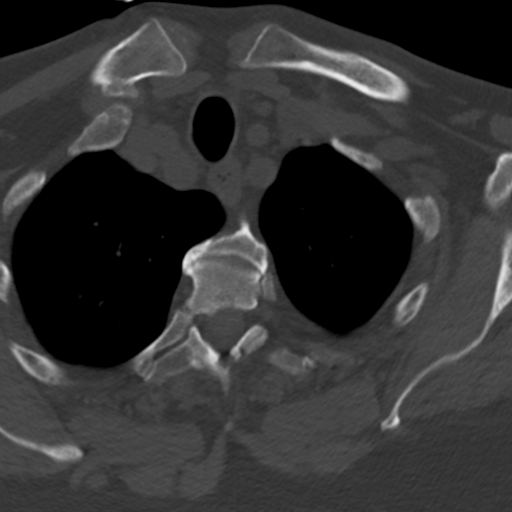
[im 20/88  bone]
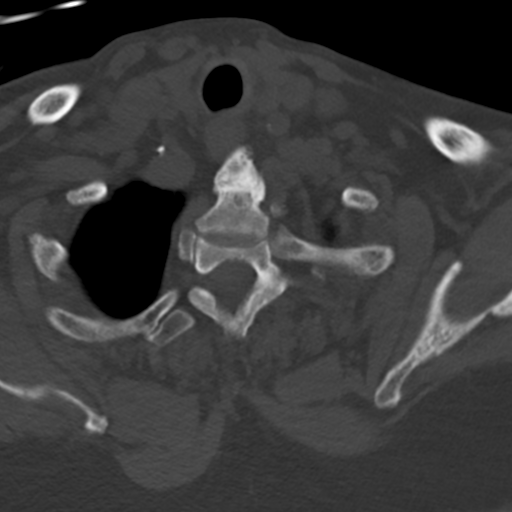
[im 30/88  bone]
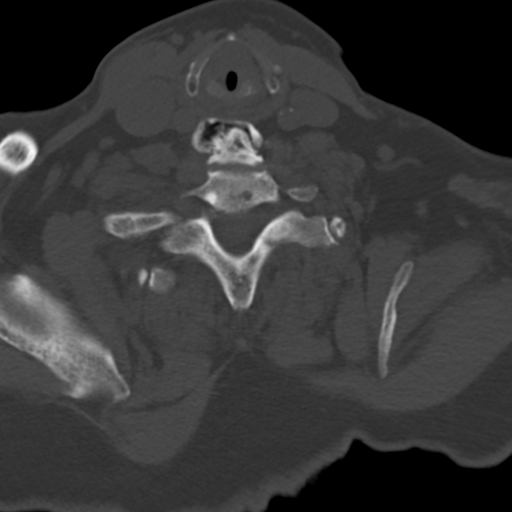
[im 39/88  bone]
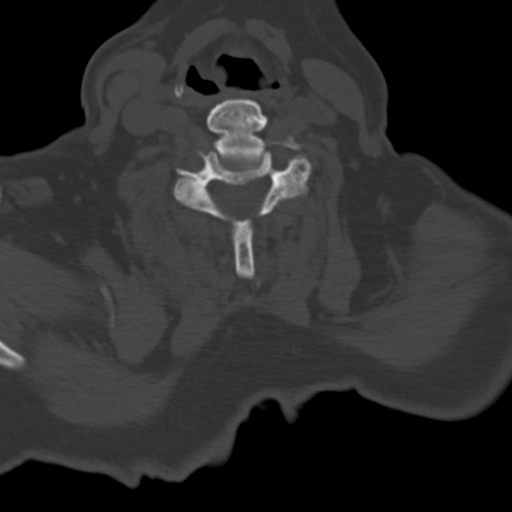
[im 49/88  soft-tissue]
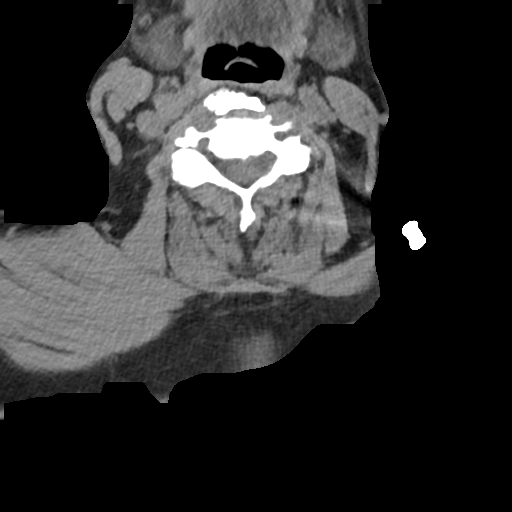
[im 49/88  bone]
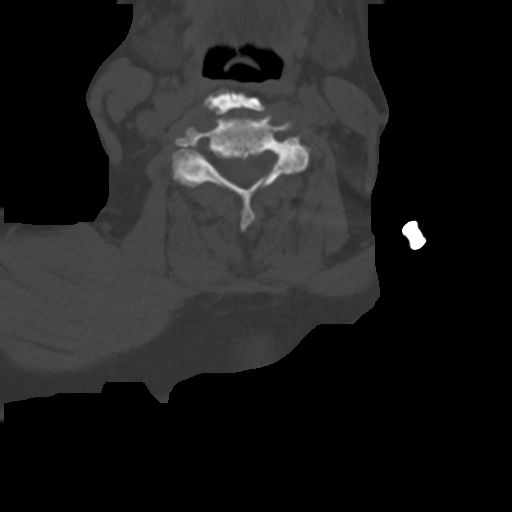
[im 59/88  bone]
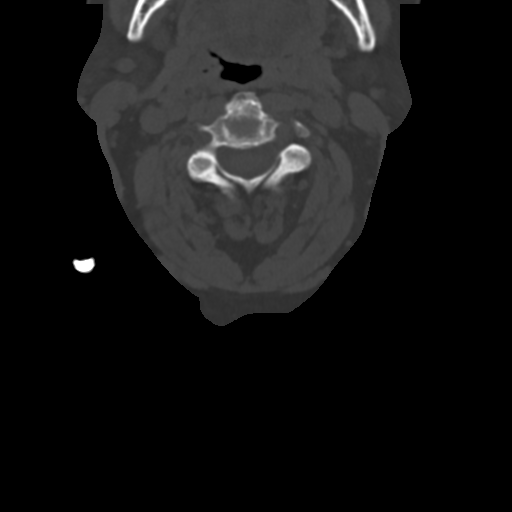
[im 68/88  bone]
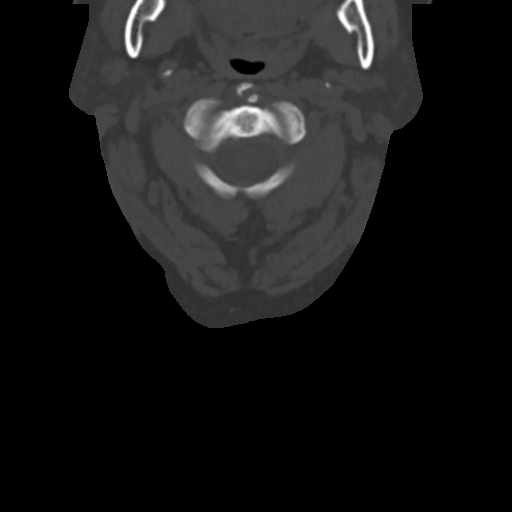
[im 78/88  bone]
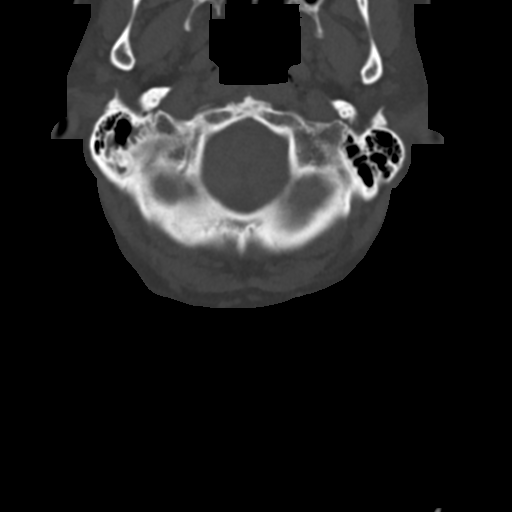

[Series 9: sagittal bone · sagittal · 0.36mm/px · 5 of 61 slices shown]
[im 11/61  bone]
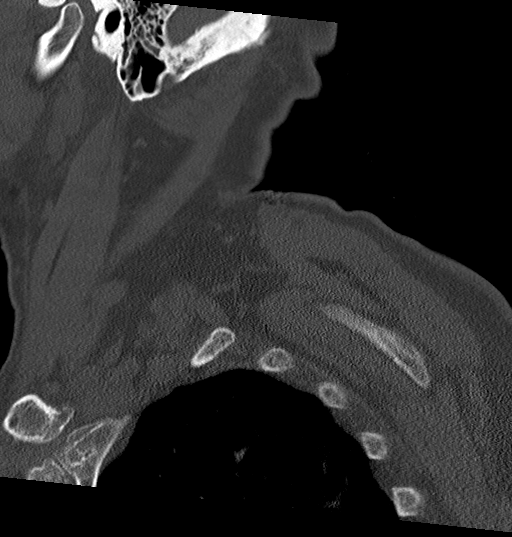
[im 21/61  bone]
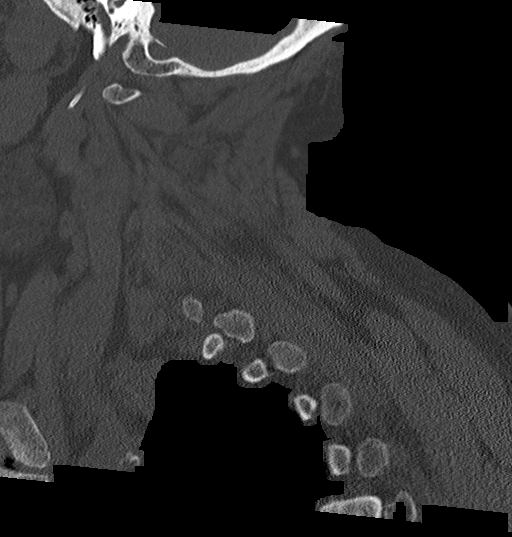
[im 31/61  bone]
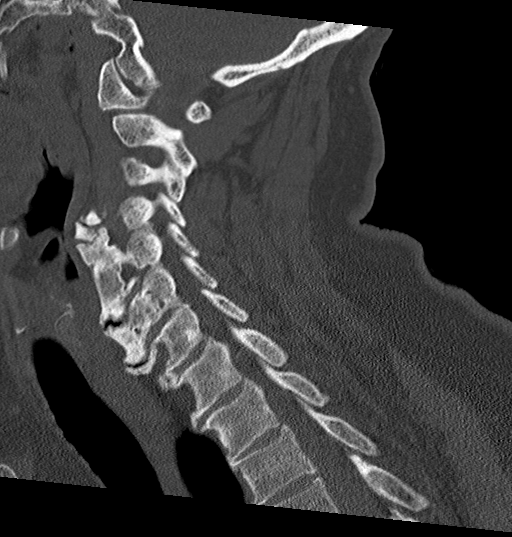
[im 41/61  bone]
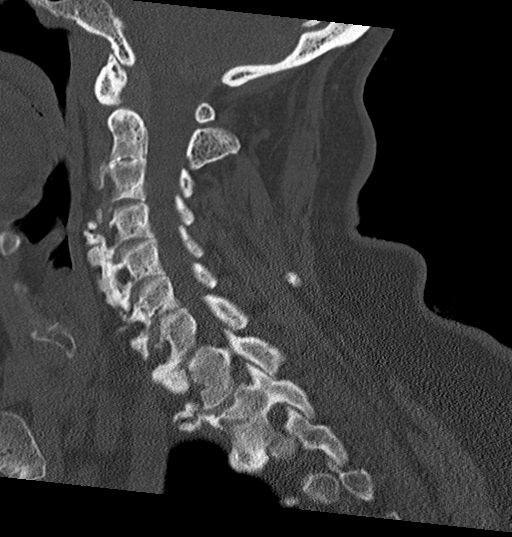
[im 51/61  bone]
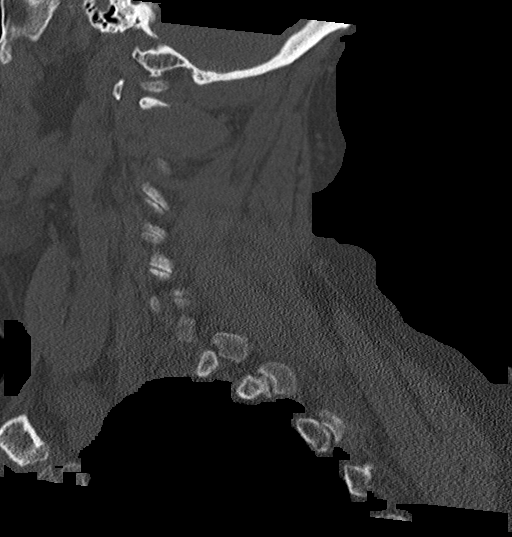

[Series 10: coronal bone · coronal · 0.26mm/px · 1 of 76 slices shown]
[im 38/76  bone]
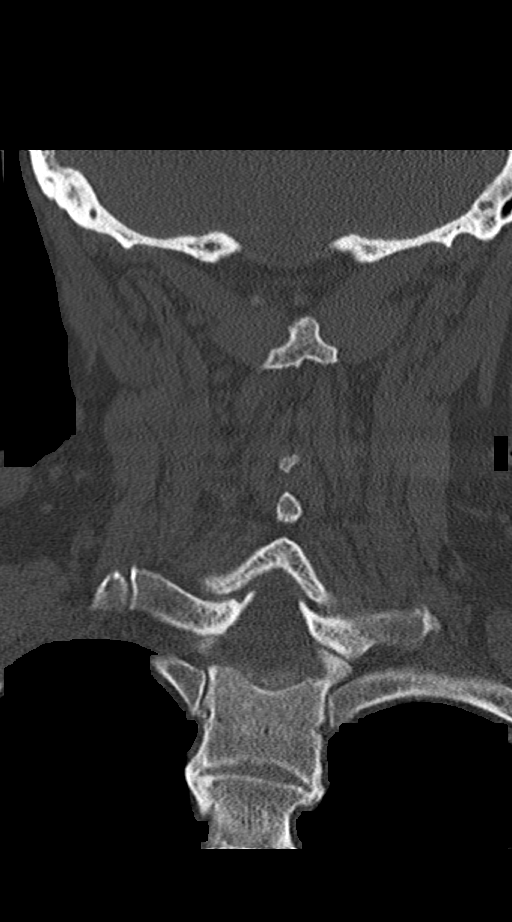

[16 of 33 positions shown; findings below may reference images not displayed]

FINDINGS: CT HEAD FINDINGS

Brain: No evidence of acute infarction, hemorrhage, hydrocephalus,
extra-axial collection or mass lesion/mass effect. Unchanged old
lacunar infarct in the left basal ganglia.

Vascular: Atherosclerotic vascular calcification of the carotid
siphons. No hyperdense vessel.

Skull: Unchanged diffuse calvarial hyperostosis. Negative for
fracture or focal lesion.

Sinuses/Orbits: No acute finding.

Other: None.

CT CERVICAL SPINE FINDINGS

Alignment: Normal.

Skull base and vertebrae: No acute fracture. No primary bone lesion
or focal pathologic process.

Soft tissues and spinal canal: No prevertebral fluid or swelling. No
visible canal hematoma.

Disc levels: Disc spaces are relatively preserved. Large bulky
ventral osteophytes throughout the cervical spine. Scattered mild
facet arthropathy.

Upper chest: Mild centrilobular emphysema.

Other: Unchanged asymmetric thickening of the right aryepiglottic
fold.
IMPRESSION: 1.  No acute intracranial abnormality.
2.  No acute cervical spine fracture.
3. Unchanged asymmetric thickening of the right aryepiglottic fold.
Correlate with direct visualization.
4.  Emphysema ([id number]).

## 2020-05-31 IMAGING — DX DG FOOT COMPLETE 3+V*L*
3 series · 3 of 3 positions shown · non-contrast
Comparison: None.

CLINICAL DATA: Left foot pain after fall.

EXAM:
LEFT FOOT - COMPLETE 3+ VIEW

[foot ap]
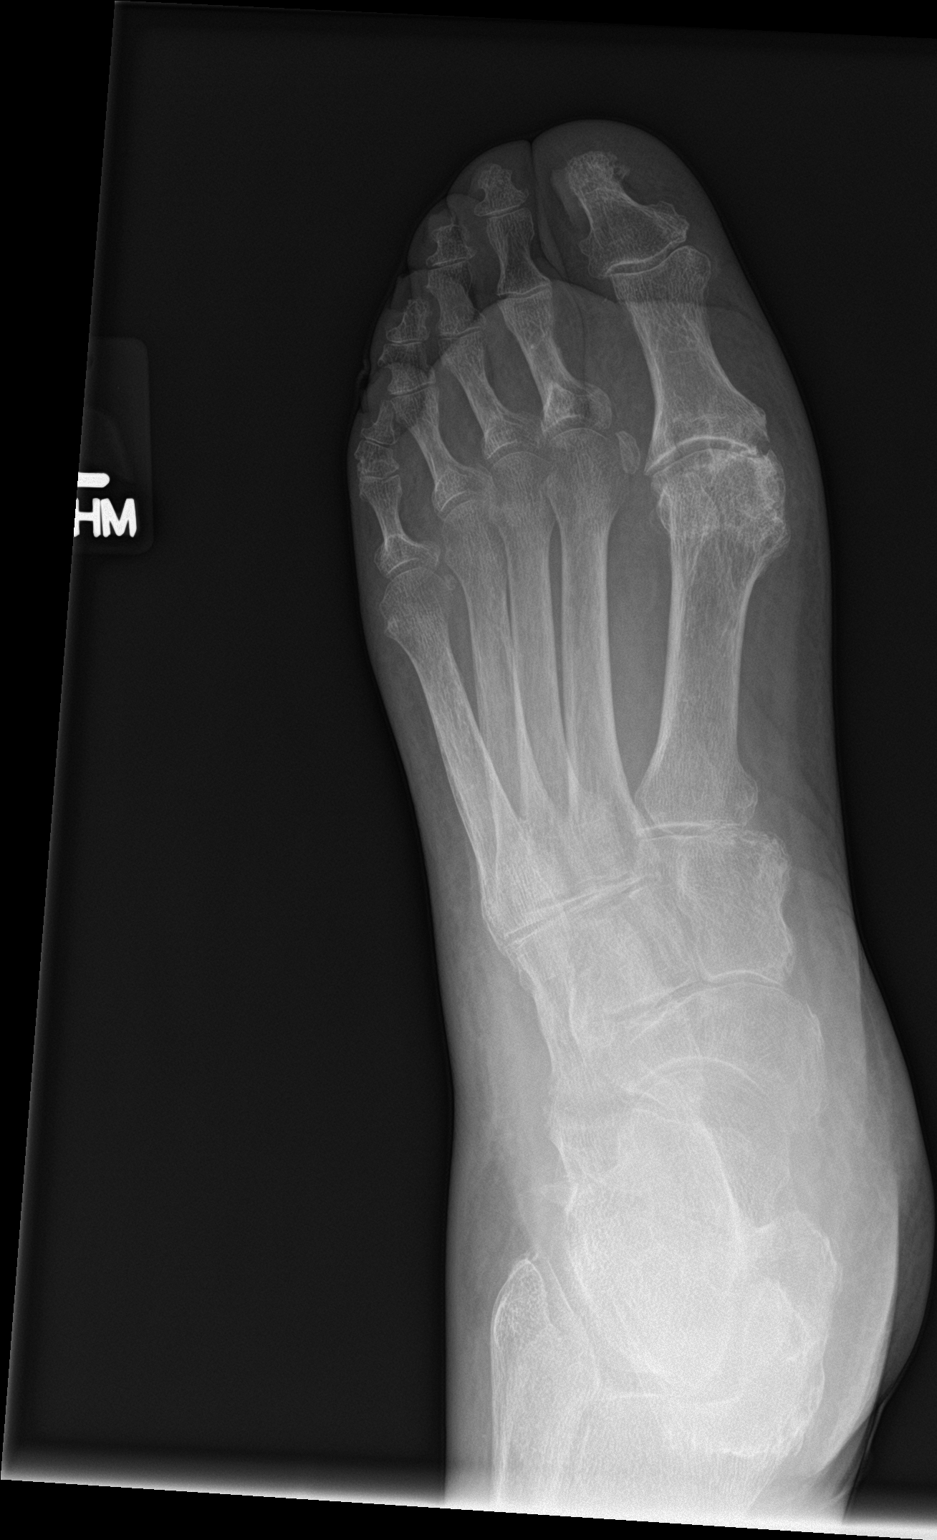

[foot obl]
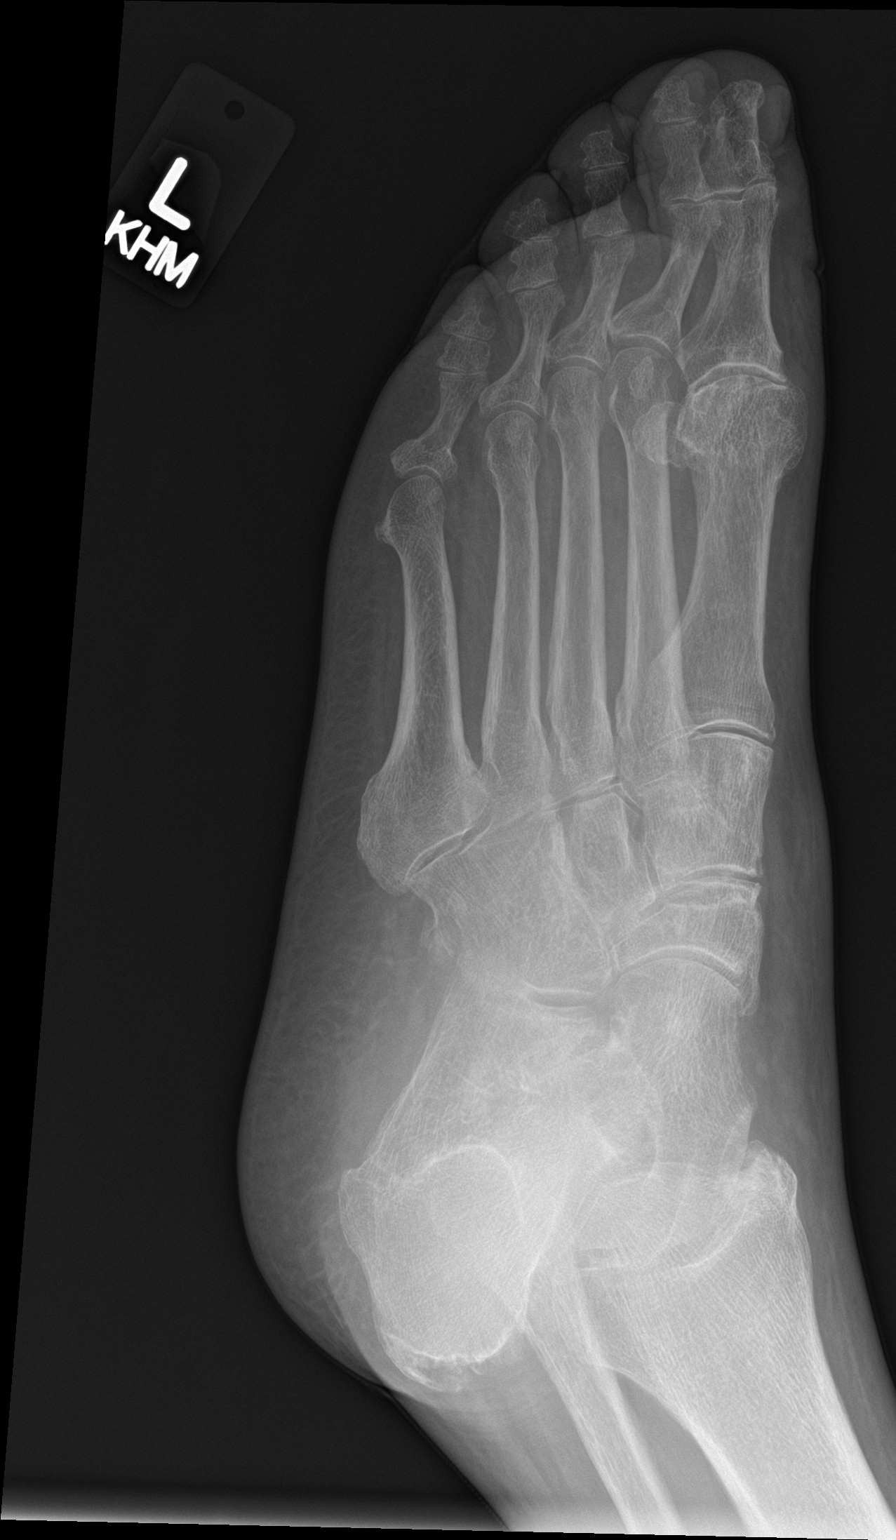

[foot lat]
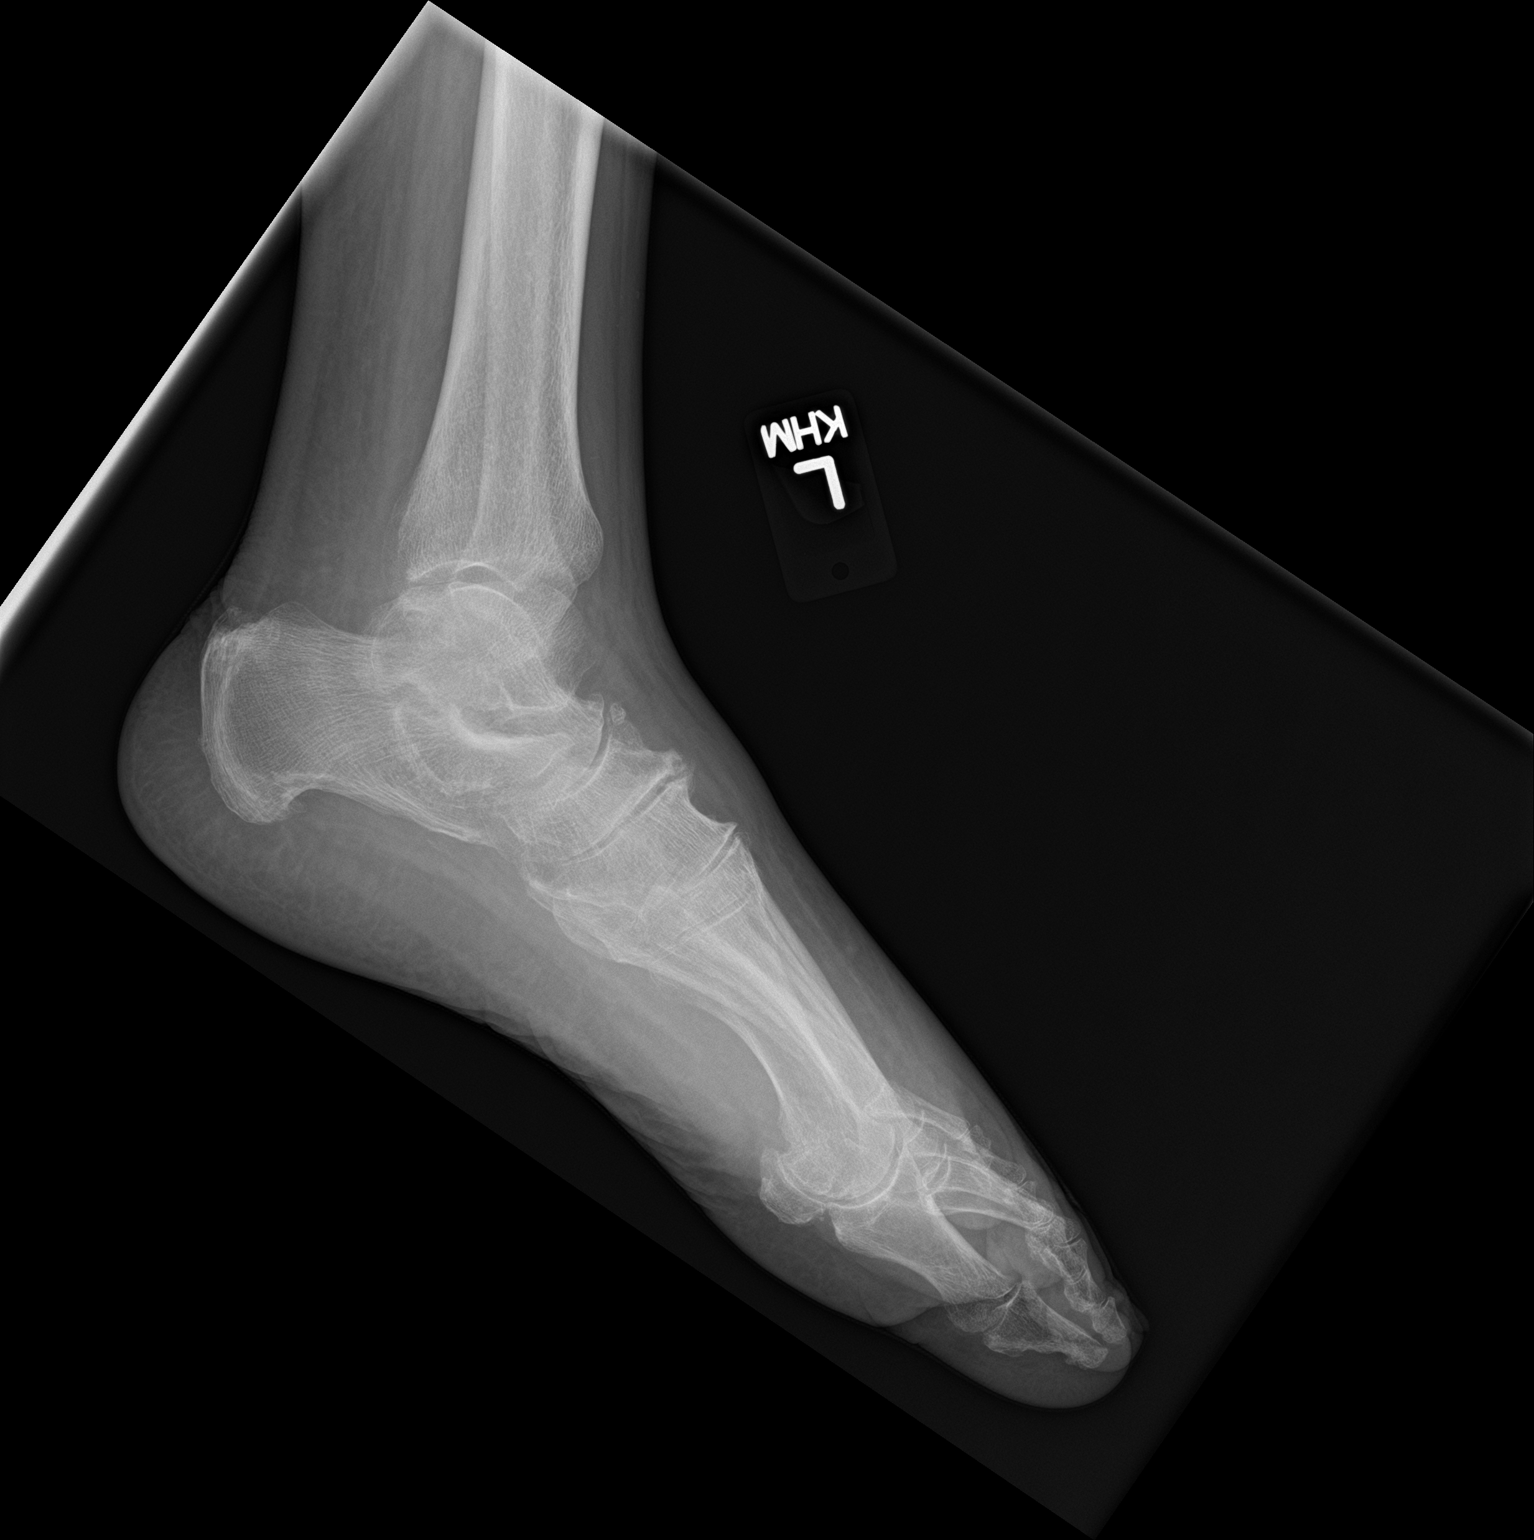

[3 of 3 positions shown; findings below may reference images not displayed]

FINDINGS: Possible nondisplaced fracture is seen involving proximal base of
first proximal phalanx. No dislocation is noted. Moderate
degenerative changes seen involving the first metatarsophalangeal
joint. Soft tissues are unremarkable.
IMPRESSION: Moderate osteoarthritis of first metatarsophalangeal joint. Possible
nondisplaced fracture involving proximal base of first proximal
phalanx.

## 2020-06-06 ENCOUNTER — Ambulatory Visit (INDEPENDENT_AMBULATORY_CARE_PROVIDER_SITE_OTHER): Payer: 59 | Admitting: Internal Medicine

## 2020-06-06 ENCOUNTER — Other Ambulatory Visit: Payer: Self-pay

## 2020-06-06 ENCOUNTER — Encounter: Payer: Self-pay | Admitting: Internal Medicine

## 2020-06-06 VITALS — BP 105/59 | HR 76 | Temp 97.8°F | Ht 64.0 in | Wt 159.8 lb

## 2020-06-06 DIAGNOSIS — R1319 Other dysphagia: Secondary | ICD-10-CM

## 2020-06-06 DIAGNOSIS — K8689 Other specified diseases of pancreas: Secondary | ICD-10-CM | POA: Diagnosis not present

## 2020-06-06 MED ORDER — ONDANSETRON HCL 4 MG PO TABS: 4.0000 mg | ORAL_TABLET | Freq: Four times a day (QID) | ORAL | 3 refills | Status: DC | PRN

## 2020-06-06 NOTE — Progress Notes (Signed)
Primary Care Physician:  [name], NP Primary Gastroenterologist:  Dr. Gala Romney  Pre-Procedure History & Physical: HPI:  Connie Ponce is a 75 y.o. female here for further evaluation of nausea, "loose bowels" anorexia and a 40 pound weight loss over the past couple of years.  Seen in the ED recently.  CT demonstrated subtle fullness possible with possible mass in the tail of the pancreas and thickening of the rectal wall possible fistula formation.Marland Kitchen  LFTs and lipase negative at that time. Patient also relates recurrent esophageal dysphagia (history of a Schatzki's ring). She was last seen in our office in about 2017.  She just had her esophagus dilated and was doing better; her reflux was well controlled.  Indeed, she has lost about 40 pounds by our scales since last weighed here.  She has had no significant abdominal pain recently.  She does not vomit.  She is able to keep food down.  She has not had any melena or blood per rectum.  Historically, constipated -  previous on Movantik and MiraLAX but she is no longer taking those medications.  GERD symptoms continue to be fairly well controlled on Dexilant 60 mg daily.  In addition to a history of a Schatzki's ring,  she does have a history of peptic ulcer disease.  H. pylori negative by histology previously.  She is also had a significant pustular rash diffusely.  She has been to multiple dermatologist locally and down at [facility].  Etiology apparently uncertain.  Further investigation is pending.   Past Medical History:  Diagnosis Date  . Anxiety   . Carpal tunnel syndrome   . COPD (chronic obstructive pulmonary disease) ([place])   . Coronary artery disease   . Gastric ulcer   . GERD (gastroesophageal reflux disease)   . Hyperglycemia   . Hyperlipidemia   . Hypertension   . Hypothyroidism   . Osteoarthritis   . Peptic ulcer disease   . Pneumonia 2010  . Renal insufficiency   . 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 Stroke ([place])  1990s   mild  . TIA (transient ischemic attack)     Past Surgical History:  Procedure Laterality Date  . ABDOMINAL HYSTERECTOMY    . APPENDECTOMY    . BACK SURGERY    . BONE MARROW ASPIRATION Left 04/22/14  . BONE MARROW BIOPSY Left 04/22/14  . CHOLECYSTECTOMY    . COLONOSCOPY     ?2005, [place]  . COLONOSCOPY N/A 04/07/2015   XBD:[id number]  . COLONOSCOPY WITH ESOPHAGOGASTRODUODENOSCOPY (EGD)  02/11/2012   RMR: Ulcerative/erosive reflux esophagitis, benign appearing gastric ulcer with unremarkable biopsy, no H pylori. Colonoscopy was unremarkable. Next colonoscopy in 2023  . CYSTECTOMY     cyst removed from rectal area.   . ESOPHAGOGASTRODUODENOSCOPY  01/05/2003   RMR: Normal esophagus, small hiatal hernia/ A couple of tiny antral erosions, otherwise normal stomach normal D1 and D2.   . ESOPHAGOGASTRODUODENOSCOPY N/A 08/11/2014   EQA:STMHDQQ ulcer/HH  . ESOPHAGOGASTRODUODENOSCOPY N/A 12/01/2014   IWL:[id number] improved gastric ulcerations/p bx  . ESOPHAGOGASTRODUODENOSCOPY N/A 01/31/2016   Procedure: ESOPHAGOGASTRODUODENOSCOPY (EGD);  Surgeon: Daneil Dolin, MD;  [facility]: [facility];  Service: Endoscopy;  Laterality: N/A;  745  . HEMORRHOID SURGERY    . JOINT REPLACEMENT Right    knee  . MALONEY DILATION N/A 01/31/2016   Procedure: Venia Minks DILATION;  Surgeon: Daneil Dolin, MD;  Location: [facility];  Service: Endoscopy;  Laterality: N/A;    Prior to  Admission medications   Medication Sig Start Date End Date Taking? Authorizing Provider  acetaminophen (TYLENOL) 325 MG tablet Take 2 tablets (650 mg total) by mouth every 6 (six) hours as needed for mild pain, fever or headache. Patient taking differently: Take 650 mg by mouth as needed for mild pain, fever or headache. 07/22/19  Yes Emokpae, Courage, MD  albuterol (VENTOLIN HFA) 108 (90 Base) MCG/ACT inhaler Inhale 2 puffs into the lungs every 4 (four) hours as needed for wheezing or shortness of breath. 07/22/19  Yes  Roxan Hockey, MD  aspirin EC 81 MG EC tablet Take 1 tablet (81 mg total) by mouth daily with breakfast. 07/23/19  Yes Emokpae, Courage, MD  augmented betamethasone dipropionate (DIPROLENE-AF) 0.05 % cream Apply 1 application topically daily as needed (for itching and irritation).   Yes [provider]  benzonatate (TESSALON) 200 MG capsule Take 1 capsule (200 mg total) by mouth 3 (three) times daily as needed for cough. Patient taking differently: Take 200 mg by mouth as needed for cough. 07/22/19  Yes Emokpae, Courage, MD  calcipotriene (DOVONOX) 0.005 % ointment Apply 1 application topically daily as needed (for itching/irritation).   Yes [provider]  carvedilol (COREG) 6.25 MG tablet Take 1 tablet (6.25 mg total) by mouth 2 (two) times daily with a meal. 07/22/19 11/19/19 Yes Emokpae, Courage, MD  cetirizine (ZYRTEC) 10 MG tablet Take 1 tablet (10 mg total) by mouth every morning. 07/22/19  Yes Emokpae, Courage, MD  Cholecalciferol (VITAMIN D-3) 5000 UNITS TABS Take 1 tablet by mouth every morning.    Yes [provider]  dexlansoprazole (DEXILANT) 60 MG capsule TAKE (1) CAPSULE BY MOUTH ONCE DAILY. 07/22/19  Yes Emokpae, Courage, MD  diltiazem (TIAZAC) 360 MG 24 hr capsule Take 360 mg by mouth daily. 07/16/19  Yes [provider]  furosemide (LASIX) 20 MG tablet Take 1 tablet (20 mg total) by mouth in the morning. 07/22/19 11/19/19 Yes Emokpae, Courage, MD  gabapentin (NEURONTIN) 300 MG capsule Take 300 mg by mouth 3 (three) times daily.  07/08/17  Yes [provider]  guaiFENesin (MUCINEX) 600 MG 12 hr tablet Take 1 tablet (600 mg total) by mouth 2 (two) times daily. Patient taking differently: Take 600 mg by mouth as needed. 07/22/19  Yes Emokpae, Courage, MD  levothyroxine (SYNTHROID) 137 MCG tablet Take 137 mcg by mouth daily before breakfast.   Yes [provider]  mometasone-formoterol (DULERA) 100-5 MCG/ACT AERO Inhale 2 puffs into the lungs 2  (two) times daily. 07/22/19  Yes Emokpae, Courage, MD  montelukast (SINGULAIR) 10 MG tablet Take 1 tablet (10 mg total) by mouth at bedtime. 07/22/19  Yes Emokpae, Courage, MD  ondansetron (ZOFRAN ODT) 4 MG disintegrating tablet Take 1 tablet (4 mg total) by mouth every 8 (eight) hours as needed. Patient taking differently: Take 4 mg by mouth as needed. 05/12/20  Yes Long, Wonda Olds, MD  oxybutynin (DITROPAN) 5 MG tablet TAKE (1) TABLET BY MOUTH THREE TIMES DAILY 01/27/20  Yes Florian Buff, MD  oxyCODONE-acetaminophen (PERCOCET/ROXICET) 5-325 MG tablet Take 2 tablets by mouth daily as needed for moderate pain or severe pain.  06/28/19  Yes [provider]  potassium chloride SA (KLOR-CON) 20 MEQ tablet Take 1 tablet (20 mEq total) by mouth every Tuesday, Thursday, Saturday, and Sunday. 07/22/19  Yes Emokpae, Courage, MD  pravastatin (PRAVACHOL) 40 MG tablet Take 40 mg by mouth every morning.  12/01/18  Yes [provider]  sertraline (ZOLOFT) 50 MG  tablet Take 50 mg by mouth every morning.  12/01/18  Yes [provider]  SYMBICORT 160-4.5 MCG/ACT inhaler Inhale into the lungs. 12/16/19  Yes [provider]  traZODone (DESYREL) 150 MG tablet Take 300 mg by mouth at bedtime.  10/01/17  Yes [provider]  Cholecalciferol (VITAMIN D) 125 MCG (5000 UT) CAPS Take 1 capsule by mouth daily. Patient not taking: Reported on 06/06/2020 10/29/19   [provider]  FEROSUL 325 (65 Fe) MG tablet Take 325 mg by mouth daily. Patient not taking: Reported on 06/06/2020 12/06/19   [provider]    Allergies as of 06/06/2020 - Review Complete 06/06/2020  Allergen Reaction Noted  . Tetracyclines & related Other (See Comments) 08/21/2015  . Shellfish-derived products Nausea And Vomiting 07/15/2015    Family History  Problem Relation Age of Onset  . Other Mother        died age 17, natural causes  . Stroke Mother   . Diabetes Daughter   . Healthy Son   .  Healthy Daughter   . Healthy Daughter   . Colon cancer Neg Hx   . Liver disease Neg Hx     Social History   Socioeconomic History  . Marital status: Married    Spouse name: Not on file  . Number of children: 3  . Years of education: Not on file  . Highest education level: Not on file  Occupational History  . Not on file  Tobacco Use  . Smoking status: Current Every Day Smoker    Packs/day: 1.00    Years: 58.00    Pack years: 58.00    Types: Cigarettes  . Smokeless tobacco: Never Used  Vaping Use  . Vaping Use: Never used  Substance and Sexual Activity  . Alcohol use: No    Alcohol/week: 0.0 standard drinks  . Drug use: No  . Sexual activity: Not Currently  Other Topics Concern  . Not on file  Social History Narrative  . Not on file   Social Determinants of Health   Financial Resource Strain: Not on file  Food Insecurity: Not on file  Transportation Needs: Not on file  Physical Activity: Not on file  Stress: Not on file  Social Connections: Not on file  Intimate Partner Violence: Not on file    Review of Systems: See HPI, otherwise negative ROS  Physical Exam: BP (!) 105/59   Pulse 76   Temp 97.8 F (36.6 C) (Temporal)   Ht $R'5\' 4"'Kw$  (1.626 m)   Wt 159 lb 12.8 oz (72.5 kg)   BMI 27.43 kg/m  General:   Somewhat frail elderly lady -  pleasant and cooperative in NAD.  Accompanied by her husband.   She has numerous pustular lesions on her trunk and extremities Neck:  Supple; no masses or thyromegaly. No significant cervical adenopathy. Lungs:  Clear throughout to auscultation.   No wheezes, crackles, or rhonchi. No acute distress. Heart:  Regular rate and rhythm; no murmurs, clicks, rubs,  or gallops. Abdomen: Nondistended.  Positive bowel sounds.  She does have some mild periumbilical tenderness to palpation.  No appreciable mass organomegaly.   Pulses:  Normal pulses noted. Extremities: 1+ lower extremity edema Rectal: No external lesions.  Good sphincter  tone.  Rectal vault is empty.  I do not palpate any abnormalities.  Mucus is Hemoccult negative  Impression/Plan: 75 year old lady with failure to thrive along with significant insidious weight loss over the past 2 years as reported along with  nausea and nonspecific change in bowel habits.  Also, has recurrent esophageal dysphagia and a vague abnormality on CT suggestive of a possible mass in the tail of the pancreas; rectal wall thickening noted;  history of peptic ulcer disease. Further evaluation warranted.  Patient needs both a diagnostic EGD and colonoscopy.  Including diagnostic EGD and colonoscopy.  Ultimately, may well come to endoscopic ultrasound to look at her pancreas in the near future.  Recommendations:  I have offered both an EGD with possible esophageal dilation and diagnostic colonoscopy today per plan.The risks, benefits, limitations, imponderables and alternatives regarding both EGD and colonoscopy have been reviewed with the patient. Questions have been answered. All parties agreeable.  We will utilize propofol for the procedures.  ASA 3.  We will also provide a prescription for Zofran 4 mg tablets 1 every 6 hours as needed for nausea.   Notice: This dictation was prepared with Dragon dictation along with smaller phrase technology. Any transcriptional errors that result from this process are unintentional and may not be corrected upon review.

## 2020-06-06 NOTE — Patient Instructions (Addendum)
As discussed, you should go ahead and have a repeat upper endoscopy with esophageal dilation as feasible/appropriate to help with your dysphagia and further evaluate your abdominal pain  At the same time, we will perform a colonoscopy to look further into the abnormality suggested on CT scan  We will utilize propofol.  ASA 3.  Ultimately, you may benefit from a test called endoscopic ultrasound to look much more closely at your pancreas.  We will give you a prescription for Zofran 4 mg tablet 1 every 6 hours as needed for nausea (dispense 40) with 3 refills.  Further recommendations to follow.

## 2020-06-14 ENCOUNTER — Telehealth: Payer: Self-pay | Admitting: Internal Medicine

## 2020-06-14 NOTE — Telephone Encounter (Signed)
Called pt, no answer and not able to leave VM 

## 2020-06-14 NOTE — Telephone Encounter (Signed)
Called pt, no answer, VM not set up We are awaiting Dr. Luvenia Starch June schedule. Patient is on list to call once received.

## 2020-06-14 NOTE — Telephone Encounter (Signed)
Patient called asking when her procedures would be scheduled

## 2020-06-15 NOTE — Telephone Encounter (Signed)
Called pt, no answer and not able to leave VM. Will await pt return call

## 2020-06-28 ENCOUNTER — Telehealth: Payer: Self-pay | Admitting: Internal Medicine

## 2020-06-28 NOTE — Telephone Encounter (Signed)
Called and informed pt we will call her to schedule procedure when we have June schedule.

## 2020-06-28 NOTE — Telephone Encounter (Signed)
Patient called asking when her procedure would be scheduled

## 2020-06-28 NOTE — Telephone Encounter (Signed)
Received a call from pts PCP Cheron Every, MD. Pt was inquiring about the next steps of treatment. I informed PCP that a VM was left for pt and when June's schedule is available, she will be scheduled for a TCS. Office notes from 06/06/20 ov was faxed to them [phone]. Pt may be calling back to confirm this FYI

## 2020-06-29 MED ORDER — NA SULFATE-K SULFATE-MG SULF 17.5-3.13-1.6 GM/177ML PO SOLN: 1.0000 | Freq: Once | ORAL | 0 refills | Status: AC

## 2020-06-29 NOTE — Telephone Encounter (Addendum)
Called pt. She has been scheduled for TCS/EGD/DIL with Dr. Jena Gauss, propofol, ASA 3 on 6/27 at 7:30am. Aware will need pre-op/covid test prior. Advised will mail with prep instructions. Confirmed pharmacy. Confirmed address.    PA approved via [facility] website for EGD/DIL. Auth# [id number] DOS 06/27/022-12/10/2020

## 2020-06-29 NOTE — Addendum Note (Signed)
Addended by: Armstead Peaks on: 06/29/2020 01:30 PM   Modules accepted: Orders

## 2020-07-04 ENCOUNTER — Telehealth: Payer: Self-pay | Admitting: Internal Medicine

## 2020-07-04 ENCOUNTER — Encounter: Payer: Self-pay | Admitting: *Deleted

## 2020-07-04 NOTE — Telephone Encounter (Signed)
Patient currently scheduled for 09/11/20 for TCS/EGD/DIL with propofol, ASA 3. Nothing sooner available. Patient placed on cancellation list if someone cancels for sooner appt. FYI to Dr. Jena Gauss

## 2020-07-04 NOTE — Telephone Encounter (Signed)
PA submitted via [facility] website. It was approved Authorization #: [id number] Requested Dates: Jul 13, 2020 - Oct 11, 2020

## 2020-07-04 NOTE — Telephone Encounter (Signed)
Called pt back and is aware pre-op/covid appt details. She voiced understanding.

## 2020-07-04 NOTE — Telephone Encounter (Signed)
Patient's PCP called to say that patient doesn't need to wait until June for her procedure with Dr Jena Gauss and could we move her to something sooner. Please advise.

## 2020-07-04 NOTE — Telephone Encounter (Signed)
I would agree with PCP at the end of June is ridiculous.  I trust you folks can maybe work some magic and get her on the schedule in  the next 2 to 3 weeks.

## 2020-07-04 NOTE — Telephone Encounter (Signed)
Called pt. Dr. Jena Gauss had a cancellation for 4/28 at 12:00pm. Patient was agreeable to this appt. Advised will call back with her new pre-op/covid test appt. She voiced understanding. Also advised will fax her instructions to her pharmacy. I called [facility] and was advised her suprep Rx is covered 100%. Advised faxed over instructions to please provide to pt.

## 2020-07-07 NOTE — Patient Instructions (Addendum)
Connie Ponce  07/07/2020     @PREFPERIOPPHARMACY @   Your procedure is scheduled on  07/13/2020.   Report to 07/15/2020 at  10;45  A.M.   Call this number if you have problems the morning of surgery:  [phone]   Remember:  Follow the diet and prep instructions given to you by the office.                      Take these medicines the morning of surgery with A SIP OF WATER  Carvedilol, diltiazem, hydroxyzine, levothyroxine, zofran (if needed), ditropan.  Use your inhaler before you come and bring your rescue inhaler with you.     Please brush your teeth.  Do not wear jewelry, make-up or nail polish.  Do not wear lotions, powders, or perfumes, or deodorant.  Do not shave 48 hours prior to surgery.  Men may shave face and neck.  Do not bring valuables to the hospital.  [name] is not responsible for any belongings or valuables.  Contacts, dentures or bridgework may not be worn into surgery.  Leave your suitcase in the car.  After surgery it may be brought to your room.  For patients admitted to the hospital, discharge time will be determined by your treatment team.  Patients discharged the day of surgery will not be allowed to drive home and must have someone with them for 24 hours.   Special instructions:  DO NOT smoke tobacco or vape for 24 hours before your procedure.   Please read over the following fact sheets that you were given. Anesthesia Post-op Instructions and Care and Recovery After Surgery       Upper Endoscopy, Adult, Care After This sheet gives you information about how to care for yourself after your procedure. Your health care provider may also give you more specific instructions. If you have problems or questions, contact your health care provider. What can I expect after the procedure? After the procedure, it is common to have:  A sore throat.  Mild stomach pain or discomfort.  Bloating.  Nausea. Follow these  instructions at home:  Follow instructions from your health care provider about what to eat or drink after your procedure.  Return to your normal activities as told by your health care provider. Ask your health care provider what activities are safe for you.  Take over-the-counter and prescription medicines only as told by your health care provider.  If you were given a sedative during the procedure, it can affect you for several hours. Do not drive or operate machinery until your health care provider says that it is safe.  Keep all follow-up visits as told by your health care provider. This is important.   Contact a health care provider if you have:  A sore throat that lasts longer than one day.  Trouble swallowing. Get help right away if:  You vomit blood or your vomit looks like coffee grounds.  You have: ? A fever. ? Bloody, black, or tarry stools. ? A severe sore throat or you cannot swallow. ? Difficulty breathing. ? Severe pain in your chest or abdomen. Summary  After the procedure, it is common to have a sore throat, mild stomach discomfort, bloating, and nausea.  If you were given a sedative during the procedure, it can affect you for several hours. Do not drive or operate machinery until your health care provider says that it  is safe.  Follow instructions from your health care provider about what to eat or drink after your procedure.  Return to your normal activities as told by your health care provider. This information is not intended to replace advice given to you by your health care provider. Make sure you discuss any questions you have with your health care provider. Document Revised: 03/02/2019 Document Reviewed: 08/04/2017 Elsevier Patient Education  2021 Elsevier Inc.  https://www.asge.org/home/for-patients/patient-information/understanding-eso-dilation-updated">  Esophageal Dilatation Esophageal dilatation, also called esophageal dilation, is a procedure  to widen or open a blocked or narrowed part of the esophagus. The esophagus is the part of the body that moves food and liquid from the mouth to the stomach. You may need this procedure if:  You have a buildup of scar tissue in your esophagus that makes it difficult, painful, or impossible to swallow. This can be caused by gastroesophageal reflux disease (GERD).  You have cancer of the esophagus.  There is a problem with how food moves through your esophagus. In some cases, you may need this procedure repeated at a later time to dilate the esophagus gradually. Tell a health care provider about:  Any allergies you have.  All medicines you are taking, including vitamins, herbs, eye drops, creams, and over-the-counter medicines.  Any problems you or family members have had with anesthetic medicines.  Any blood disorders you have.  Any surgeries you have had.  Any medical conditions you have.  Any antibiotic medicines you are required to take before dental procedures.  Whether you are pregnant or may be pregnant. What are the risks? Generally, this is a safe procedure. However, problems may occur, including:  Bleeding due to a tear in the lining of the esophagus.  A hole, or perforation, in the esophagus. What happens before the procedure?  Ask your health care provider about: ? Changing or stopping your regular medicines. This is especially important if you are taking diabetes medicines or blood thinners. ? Taking medicines such as aspirin and ibuprofen. These medicines can thin your blood. Do not take these medicines unless your health care provider tells you to take them. ? Taking over-the-counter medicines, vitamins, herbs, and supplements.  Follow instructions from your health care provider about eating or drinking restrictions.  Plan to have a responsible adult take you home from the hospital or clinic.  Plan to have a responsible adult care for you for the time you are  told after you leave the hospital or clinic. This is important. What happens during the procedure?  You may be given a medicine to help you relax (sedative).  A numbing medicine may be sprayed into the back of your throat, or you may gargle the medicine.  Your health care provider may perform the dilatation using various surgical instruments, such as: ? Simple dilators. This instrument is carefully placed in the esophagus to stretch it. ? Guided wire bougies. This involves using an endoscope to insert a wire into the esophagus. A dilator is passed over this wire to enlarge the esophagus. Then the wire is removed. ? Balloon dilators. An endoscope with a small balloon is inserted into the esophagus. The balloon is inflated to stretch the esophagus and open it up. The procedure may vary among health care providers and hospitals. What can I expect after the procedure?  Your blood pressure, heart rate, breathing rate, and blood oxygen level will be monitored until you leave the hospital or clinic.  Your throat may feel slightly sore and numb. This  will get better over time.  You will not be allowed to eat or drink until your throat is no longer numb.  When you are able to drink, urinate, and sit on the edge of the bed without nausea or dizziness, you may be able to return home. Follow these instructions at home:  Take over-the-counter and prescription medicines only as told by your health care provider.  If you were given a sedative during the procedure, it can affect you for several hours. Do not drive or operate machinery until your health care provider says that it is safe.  Plan to have a responsible adult care for you for the time you are told. This is important.  Follow instructions from your health care provider about any eating or drinking restrictions.  Do not use any products that contain nicotine or tobacco, such as cigarettes, e-cigarettes, and chewing tobacco. If you need help  quitting, ask your health care provider.  Keep all follow-up visits. This is important. Contact a health care provider if:  You have a fever.  You have pain that is not relieved by medicine. Get help right away if:  You have chest pain.  You have trouble breathing.  You have trouble swallowing.  You vomit blood.  You have black, tarry, or bloody stools. These symptoms may represent a serious problem that is an emergency. Do not wait to see if the symptoms will go away. Get medical help right away. Call your local emergency services (911 in the [place] Do not drive yourself to the hospital. Summary  Esophageal dilatation, also called esophageal dilation, is a procedure to widen or open a blocked or narrowed part of the esophagus.  Plan to have a responsible adult take you home from the hospital or clinic.  For this procedure, a numbing medicine may be sprayed into the back of your throat, or you may gargle the medicine.  Do not drive or operate machinery until your health care provider says that it is safe. This information is not intended to replace advice given to you by your health care provider. Make sure you discuss any questions you have with your health care provider. Document Revised: 07/21/2019 Document Reviewed: 07/21/2019 Elsevier Patient Education  2021 [employer] Inc.  Colonoscopy, Adult, Care After This sheet gives you information about how to care for yourself after your procedure. Your health care provider may also give you more specific instructions. If you have problems or questions, contact your health care provider. What can I expect after the procedure? After the procedure, it is common to have:  A small amount of blood in your stool for 24 hours after the procedure.  Some gas.  Mild cramping or bloating of your abdomen. Follow these instructions at home: Eating and drinking  Drink enough fluid to keep your urine pale yellow.  Follow instructions from  your health care provider about eating or drinking restrictions.  Resume your normal diet as instructed by your health care provider. Avoid heavy or fried foods that are hard to digest.   Activity  Rest as told by your health care provider.  Avoid sitting for a long time without moving. Get up to take short walks every 1-2 hours. This is important to improve blood flow and breathing. Ask for help if you feel weak or unsteady.  Return to your normal activities as told by your health care provider. Ask your health care provider what activities are safe for you. Managing cramping and bloating  Try  walking around when you have cramps or feel bloated.  Apply heat to your abdomen as told by your health care provider. Use the heat source that your health care provider recommends, such as a moist heat pack or a heating pad. ? Place a towel between your skin and the heat source. ? Leave the heat on for 20-30 minutes. ? Remove the heat if your skin turns bright red. This is especially important if you are unable to feel pain, heat, or cold. You may have a greater risk of getting burned.   General instructions  If you were given a sedative during the procedure, it can affect you for several hours. Do not drive or operate machinery until your health care provider says that it is safe.  For the first 24 hours after the procedure: ? Do not sign important documents. ? Do not drink alcohol. ? Do your regular daily activities at a slower pace than normal. ? Eat soft foods that are easy to digest.  Take over-the-counter and prescription medicines only as told by your health care provider.  Keep all follow-up visits as told by your health care provider. This is important. Contact a health care provider if:  You have blood in your stool 2-3 days after the procedure. Get help right away if you have:  More than a small spotting of blood in your stool.  Large blood clots in your stool.  Swelling of  your abdomen.  Nausea or vomiting.  A fever.  Increasing pain in your abdomen that is not relieved with medicine. Summary  After the procedure, it is common to have a small amount of blood in your stool. You may also have mild cramping and bloating of your abdomen.  If you were given a sedative during the procedure, it can affect you for several hours. Do not drive or operate machinery until your health care provider says that it is safe.  Get help right away if you have a lot of blood in your stool, nausea or vomiting, a fever, or increased pain in your abdomen. This information is not intended to replace advice given to you by your health care provider. Make sure you discuss any questions you have with your health care provider. Document Revised: 02/26/2019 Document Reviewed: 09/28/2018 Elsevier Patient Education  2021 Else[employer] Inc. Monitored Anesthesia Care, Care After This sheet gives you information about how to care for yourself after your procedure. Your health care provider may also give you more specific instructions. If you have problems or questions, contact your health care provider. What can I expect after the procedure? After the procedure, it is common to have:  Tiredness.  Forgetfulness about what happened after the procedure.  Impaired judgment for important decisions.  Nausea or vomiting.  Some difficulty with balance. Follow these instructions at home: For the time period you were told by your health care provider:  Rest as needed.  Do not participate in activities where you could fall or become injured.  Do not drive or use machinery.  Do not drink alcohol.  Do not take sleeping pills or medicines that cause drowsiness.  Do not make important decisions or sign legal documents.  Do not take care of children on your own.      Eating and drinking  Follow the diet that is recommended by your health care provider.  Drink enough fluid to keep your  urine pale yellow.  If you vomit: ? Drink water, juice, or soup when you  can drink without vomiting. ? Make sure you have little or no nausea before eating solid foods. General instructions  Have a responsible adult stay with you for the time you are told. It is important to have someone help care for you until you are awake and alert.  Take over-the-counter and prescription medicines only as told by your health care provider.  If you have sleep apnea, surgery and certain medicines can increase your risk for breathing problems. Follow instructions from your health care provider about wearing your sleep device: ? Anytime you are sleeping, including during daytime naps. ? While taking prescription pain medicines, sleeping medicines, or medicines that make you drowsy.  Avoid smoking.  Keep all follow-up visits as told by your health care provider. This is important. Contact a health care provider if:  You keep feeling nauseous or you keep vomiting.  You feel light-headed.  You are still sleepy or having trouble with balance after 24 hours.  You develop a rash.  You have a fever.  You have redness or swelling around the IV site. Get help right away if:  You have trouble breathing.  You have new-onset confusion at home. Summary  For several hours after your procedure, you may feel tired. You may also be forgetful and have poor judgment.  Have a responsible adult stay with you for the time you are told. It is important to have someone help care for you until you are awake and alert.  Rest as told. Do not drive or operate machinery. Do not drink alcohol or take sleeping pills.  Get help right away if you have trouble breathing, or if you suddenly become confused. This information is not intended to replace advice given to you by your health care provider. Make sure you discuss any questions you have with your health care provider. Document Revised: 11/18/2019 Document Reviewed:  02/04/2019 Elsevier Patient Education  2021 [employer].

## 2020-07-11 ENCOUNTER — Other Ambulatory Visit: Payer: Self-pay

## 2020-07-11 ENCOUNTER — Encounter (HOSPITAL_COMMUNITY)
Admission: RE | Admit: 2020-07-11 | Discharge: 2020-07-11 | Disposition: A | Discharge status: Home / Self Care | Payer: 59 | Source: Ambulatory Visit | Attending: Internal Medicine | Admitting: Internal Medicine

## 2020-07-11 ENCOUNTER — Other Ambulatory Visit (HOSPITAL_COMMUNITY)
Admission: RE | Admit: 2020-07-11 | Discharge: 2020-07-11 | Disposition: A | Discharge status: Home / Self Care | Payer: 59 | Source: Ambulatory Visit | Attending: Internal Medicine | Admitting: Internal Medicine

## 2020-07-11 DIAGNOSIS — Z20822 Contact with and (suspected) exposure to covid-19: Secondary | ICD-10-CM | POA: Diagnosis not present

## 2020-07-11 DIAGNOSIS — Z01812 Encounter for preprocedural laboratory examination: Secondary | ICD-10-CM | POA: Diagnosis not present

## 2020-07-11 LAB — BASIC METABOLIC PANEL
Anion gap: 10 (ref 5–15)
BUN: 9 mg/dL (ref 8–23)
CO2: 25 mmol/L (ref 22–32)
Calcium: 8.7 mg/dL — ABNORMAL LOW (ref 8.9–10.3)
Chloride: 102 mmol/L (ref 98–111)
Creatinine, Ser: 0.67 mg/dL (ref 0.44–1.00)
GFR, Estimated: >60 mL/min (ref >60)
Glucose, Bld: 97 mg/dL (ref 70–99)
Potassium: 3 mmol/L — ABNORMAL LOW (ref 3.5–5.1)
Sodium: 137 mmol/L (ref 135–145)

## 2020-07-12 ENCOUNTER — Other Ambulatory Visit: Payer: Self-pay

## 2020-07-12 LAB — SARS CORONAVIRUS 2 (TAT 6-24 HRS): SARS Coronavirus 2: NEGATIVE

## 2020-07-12 MED ORDER — POTASSIUM CHLORIDE CRYS ER 20 MEQ PO TBCR: 40.0000 meq | EXTENDED_RELEASE_TABLET | Freq: Two times a day (BID) | ORAL | 0 refills | Status: DC

## 2020-07-13 ENCOUNTER — Ambulatory Visit (HOSPITAL_COMMUNITY): Payer: 59 | Admitting: Anesthesiology

## 2020-07-13 ENCOUNTER — Other Ambulatory Visit: Payer: Self-pay

## 2020-07-13 ENCOUNTER — Ambulatory Visit (HOSPITAL_COMMUNITY)
Admission: RE | Admit: 2020-07-13 | Discharge: 2020-07-13 | Disposition: A | Discharge status: Home / Self Care | Payer: 59 | Attending: Internal Medicine | Admitting: Internal Medicine

## 2020-07-13 ENCOUNTER — Encounter (HOSPITAL_COMMUNITY): Payer: Self-pay | Admitting: Internal Medicine

## 2020-07-13 ENCOUNTER — Encounter (HOSPITAL_COMMUNITY)
Admission: RE | Disposition: A | Discharge status: Home / Self Care | Payer: Self-pay | Source: Home / Self Care | Attending: Internal Medicine

## 2020-07-13 DIAGNOSIS — Z7982 Long term (current) use of aspirin: Secondary | ICD-10-CM | POA: Diagnosis not present

## 2020-07-13 DIAGNOSIS — K219 Gastro-esophageal reflux disease without esophagitis: Secondary | ICD-10-CM | POA: Diagnosis not present

## 2020-07-13 DIAGNOSIS — Z79890 Hormone replacement therapy (postmenopausal): Secondary | ICD-10-CM | POA: Insufficient documentation

## 2020-07-13 DIAGNOSIS — Z881 Allergy status to other antibiotic agents status: Secondary | ICD-10-CM | POA: Insufficient documentation

## 2020-07-13 DIAGNOSIS — Z96651 Presence of right artificial knee joint: Secondary | ICD-10-CM | POA: Diagnosis not present

## 2020-07-13 DIAGNOSIS — K529 Noninfective gastroenteritis and colitis, unspecified: Secondary | ICD-10-CM | POA: Diagnosis not present

## 2020-07-13 DIAGNOSIS — R933 Abnormal findings on diagnostic imaging of other parts of digestive tract: Secondary | ICD-10-CM | POA: Diagnosis not present

## 2020-07-13 DIAGNOSIS — Z8711 Personal history of peptic ulcer disease: Secondary | ICD-10-CM | POA: Insufficient documentation

## 2020-07-13 DIAGNOSIS — F17210 Nicotine dependence, cigarettes, uncomplicated: Secondary | ICD-10-CM | POA: Diagnosis not present

## 2020-07-13 DIAGNOSIS — R1310 Dysphagia, unspecified: Secondary | ICD-10-CM

## 2020-07-13 DIAGNOSIS — Z79899 Other long term (current) drug therapy: Secondary | ICD-10-CM | POA: Diagnosis not present

## 2020-07-13 DIAGNOSIS — R1314 Dysphagia, pharyngoesophageal phase: Secondary | ICD-10-CM | POA: Diagnosis not present

## 2020-07-13 DIAGNOSIS — Z7951 Long term (current) use of inhaled steroids: Secondary | ICD-10-CM | POA: Diagnosis not present

## 2020-07-13 HISTORY — PX: ESOPHAGOGASTRODUODENOSCOPY (EGD) WITH PROPOFOL: SHX5813

## 2020-07-13 HISTORY — PX: MALONEY DILATION: SHX5535

## 2020-07-13 HISTORY — PX: BIOPSY: SHX5522

## 2020-07-13 HISTORY — PX: COLONOSCOPY WITH PROPOFOL: SHX5780

## 2020-07-13 SURGERY — COLONOSCOPY WITH PROPOFOL
Anesthesia: General

## 2020-07-13 MED ORDER — EPHEDRINE SULFATE-NACL 50-0.9 MG/10ML-% IV SOSY
PREFILLED_SYRINGE | INTRAVENOUS | Status: DC | PRN
  Administered 2020-07-13: 5 mg via INTRAVENOUS

## 2020-07-13 MED ORDER — LACTATED RINGERS IV SOLN: INTRAVENOUS | Status: DC

## 2020-07-13 MED ORDER — STERILE WATER FOR IRRIGATION IR SOLN
Status: DC | PRN
  Administered 2020-07-13: 200 mL
  Administered 2020-07-13: 100 mL

## 2020-07-13 MED ORDER — PROPOFOL 500 MG/50ML IV EMUL
INTRAVENOUS | Status: DC | PRN
  Administered 2020-07-13: 150 ug/kg/min via INTRAVENOUS

## 2020-07-13 MED ORDER — LIDOCAINE HCL (CARDIAC) PF 100 MG/5ML IV SOSY
PREFILLED_SYRINGE | INTRAVENOUS | Status: DC | PRN
  Administered 2020-07-13: 50 mg via INTRAVENOUS

## 2020-07-13 MED ORDER — PROPOFOL 10 MG/ML IV BOLUS
INTRAVENOUS | Status: DC | PRN
  Administered 2020-07-13: 100 mg via INTRAVENOUS
  Administered 2020-07-13: 30 mg via INTRAVENOUS
  Administered 2020-07-13: 40 mg via INTRAVENOUS

## 2020-07-13 MED ORDER — EPHEDRINE 5 MG/ML INJ
INTRAVENOUS | Status: AC
  Filled 2020-07-13: qty 10

## 2020-07-13 MED ORDER — PHENYLEPHRINE 40 MCG/ML (10ML) SYRINGE FOR IV PUSH (FOR BLOOD PRESSURE SUPPORT)
PREFILLED_SYRINGE | INTRAVENOUS | Status: AC
  Filled 2020-07-13: qty 10

## 2020-07-13 MED ORDER — PHENYLEPHRINE 40 MCG/ML (10ML) SYRINGE FOR IV PUSH (FOR BLOOD PRESSURE SUPPORT)
PREFILLED_SYRINGE | INTRAVENOUS | Status: DC | PRN
  Administered 2020-07-13 (×2): 120 ug via INTRAVENOUS
  Administered 2020-07-13: 80 ug via INTRAVENOUS

## 2020-07-13 NOTE — Discharge Instructions (Signed)
Colonoscopy Discharge Instructions  Read the instructions outlined below and refer to this sheet in the next few weeks. These discharge instructions provide you with general information on caring for yourself after you leave the hospital. Your doctor may also give you specific instructions. While your treatment has been planned according to the most current medical practices available, unavoidable complications occasionally occur. If you have any problems or questions after discharge, call Dr. Gala Romney at [phone]. ACTIVITY  You may resume your regular activity, but move at a slower pace for the next 24 hours.   Take frequent rest periods for the next 24 hours.   Walking will help get rid of the air and reduce the bloated feeling in your belly (abdomen).   No driving for 24 hours (because of the medicine (anesthesia) used during the test).    Do not sign any important legal documents or operate any machinery for 24 hours (because of the anesthesia used during the test).  NUTRITION  Drink plenty of fluids.   You may resume your normal diet as instructed by your doctor.   Begin with a light meal and progress to your normal diet. Heavy or fried foods are harder to digest and may make you feel sick to your stomach (nauseated).   Avoid alcoholic beverages for 24 hours or as instructed.  MEDICATIONS  You may resume your normal medications unless your doctor tells you otherwise.  WHAT YOU CAN EXPECT TODAY  Some feelings of bloating in the abdomen.   Passage of more gas than usual.   Spotting of blood in your stool or on the toilet paper.  IF YOU HAD POLYPS REMOVED DURING THE COLONOSCOPY:  No aspirin products for 7 days or as instructed.   No alcohol for 7 days or as instructed.   Eat a soft diet for the next 24 hours.  FINDING OUT THE RESULTS OF YOUR TEST Not all test results are available during your visit. If your test results are not back during the visit, make an appointment  with your caregiver to find out the results. Do not assume everything is normal if you have not heard from your caregiver or the medical facility. It is important for you to follow up on all of your test results.  SEEK IMMEDIATE MEDICAL ATTENTION IF:  You have more than a spotting of blood in your stool.   Your belly is swollen (abdominal distention).   You are nauseated or vomiting.   You have a temperature over 101.   You have abdominal pain or discomfort that is severe or gets worse throughout the day.   EGD Discharge instructions Please read the instructions outlined below and refer to this sheet in the next few weeks. These discharge instructions provide you with general information on caring for yourself after you leave the hospital. Your doctor may also give you specific instructions. While your treatment has been planned according to the most current medical practices available, unavoidable complications occasionally occur. If you have any problems or questions after discharge, please call your doctor. ACTIVITY  You may resume your regular activity but move at a slower pace for the next 24 hours.   Take frequent rest periods for the next 24 hours.   Walking will help expel (get rid of) the air and reduce the bloated feeling in your abdomen.   No driving for 24 hours (because of the anesthesia (medicine) used during the test).   You may shower.   Do not sign any  important legal documents or operate any machinery for 24 hours (because of the anesthesia used during the test).  NUTRITION  Drink plenty of fluids.   You may resume your normal diet.   Begin with a light meal and progress to your normal diet.   Avoid alcoholic beverages for 24 hours or as instructed by your caregiver.  MEDICATIONS  You may resume your normal medications unless your caregiver tells you otherwise.  WHAT YOU CAN EXPECT TODAY  You may experience abdominal discomfort such as a feeling of  fullness or "gas" pains.  FOLLOW-UP  Your doctor will discuss the results of your test with you.  SEEK IMMEDIATE MEDICAL ATTENTION IF ANY OF THE FOLLOWING OCCUR:  Excessive nausea (feeling sick to your stomach) and/or vomiting.   Severe abdominal pain and distention (swelling).   Trouble swallowing.   Temperature over 101 F (37.8 C).   Rectal bleeding or vomiting of blood.    Your esophagus was stretched today.  Samples were taken.  Your colon looked good today.  Samples taken.  Further recommendations to follow pending review of pathology report  Office visit with [place] in 3 months

## 2020-07-13 NOTE — Anesthesia Procedure Notes (Signed)
Date/Time: 07/13/2020 2:24 PM Performed by: Julian Reil, CRNA Pre-anesthesia Checklist: Patient identified, Emergency Drugs available, Suction available and Patient being monitored Patient Re-evaluated:Patient Re-evaluated prior to induction Oxygen Delivery Method: Nasal cannula Induction Type: IV induction Placement Confirmation: positive ETCO2

## 2020-07-13 NOTE — Anesthesia Preprocedure Evaluation (Addendum)
Anesthesia Evaluation  Patient identified by MRN, date of birth, ID band Patient awake    Reviewed: Allergy & Precautions, NPO status , Patient's Chart, lab work & pertinent test results  History of Anesthesia Complications Negative for: history of anesthetic complications  Airway Mallampati: I  TM Distance: >3 FB Neck ROM: Full    Dental  (+) Edentulous Upper, Edentulous Lower   Pulmonary pneumonia, resolved, COPD,  COPD inhaler, Current Smoker and Patient abstained from smoking.,    Pulmonary exam normal breath sounds clear to auscultation       Cardiovascular Exercise Tolerance: Good hypertension, Pt. on medications + CAD  Normal cardiovascular exam Rhythm:Regular Rate:Normal  12-May-2020 13:44:37  [facility] ROUTINE RECORD Sinus tachycardia with Fusion complexes Low voltage QRS Cannot rule out Anterior infarct , age undetermined   Neuro/Psych Anxiety TIA Neuromuscular disease (frequent falls) CVA    GI/Hepatic PUD, GERD  Medicated,  Endo/Other  Hypothyroidism   Renal/GU Renal Insufficiency and ARFRenal disease     Musculoskeletal  (+) Arthritis  (spinal stenosis),   Abdominal   Peds  Hematology  (+) anemia ,   Anesthesia Other Findings frequent falls  Reproductive/Obstetrics                           Anesthesia Physical Anesthesia Plan  ASA: III  Anesthesia Plan: General   Post-op Pain Management:    Induction: Intravenous  PONV Risk Score and Plan: Propofol infusion  Airway Management Planned: Nasal Cannula and Natural Airway  Additional Equipment:   Intra-op Plan:   Post-operative Plan:   Informed Consent: I have reviewed the patients History and Physical, chart, labs and discussed the procedure including the risks, benefits and alternatives for the proposed anesthesia with the patient or authorized representative who has indicated his/her  understanding and acceptance.       Plan Discussed with: CRNA and Surgeon  Anesthesia Plan Comments:        Anesthesia Quick Evaluation

## 2020-07-13 NOTE — H&P (Signed)
@LOGO @   Primary Care Physician:  [name], NP Primary Gastroenterologist:  Dr. Gala Romney  Pre-Procedure History & Physical: HPI:  Connie Ponce is a 75 y.o. female here for recurrent esophageal dysphagia.  Longstanding GERD.  History of Schatzki's ring.  Chronic diarrhea previously constipated.  Past Medical History:  Diagnosis Date  . Anxiety   . Carpal tunnel syndrome   . COPD (chronic obstructive pulmonary disease) ([place])   . Coronary artery disease   . Gastric ulcer   . GERD (gastroesophageal reflux disease)   . Hyperglycemia   . Hyperlipidemia   . Hypertension   . Hypothyroidism   . Osteoarthritis   . Peptic ulcer disease   . Pneumonia 2010  . Renal insufficiency   . 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 Stroke ([place]) 1990s   mild  . TIA (transient ischemic attack)     Past Surgical History:  Procedure Laterality Date  . ABDOMINAL HYSTERECTOMY    . APPENDECTOMY    . BACK SURGERY    . BONE MARROW ASPIRATION Left 04/22/14  . BONE MARROW BIOPSY Left 04/22/14  . CHOLECYSTECTOMY    . COLONOSCOPY     ?2005, [place]  . COLONOSCOPY N/A 04/07/2015   ZTI:[id number]  . COLONOSCOPY WITH ESOPHAGOGASTRODUODENOSCOPY (EGD)  02/11/2012   RMR: Ulcerative/erosive reflux esophagitis, benign appearing gastric ulcer with unremarkable biopsy, no H pylori. Colonoscopy was unremarkable. Next colonoscopy in 2023  . CYSTECTOMY     cyst removed from rectal area.   . ESOPHAGOGASTRODUODENOSCOPY  01/05/2003   RMR: Normal esophagus, small hiatal hernia/ A couple of tiny antral erosions, otherwise normal stomach normal D1 and D2.   . ESOPHAGOGASTRODUODENOSCOPY N/A 08/11/2014   IPJ:ASNKNLZ ulcer/HH  . ESOPHAGOGASTRODUODENOSCOPY N/A 12/01/2014   JQB:HALPFXTKWIOXB improved gastric ulcerations/p bx  . ESOPHAGOGASTRODUODENOSCOPY N/A 01/31/2016   Procedure: ESOPHAGOGASTRODUODENOSCOPY (EGD);  Surgeon: Daneil Dolin, MD;  [facility];  Service: Endoscopy;  Laterality: N/A;  745  .  HEMORRHOID SURGERY    . JOINT REPLACEMENT Right    knee  . MALONEY DILATION N/A 01/31/2016   Procedure: Venia Minks DILATION;  Surgeon: Daneil Dolin, MD;  Location: [facility];  Service: Endoscopy;  Laterality: N/A;    Prior to Admission medications   Medication Sig Start Date End Date Taking? Authorizing Provider  acetaminophen (TYLENOL) 325 MG tablet Take 2 tablets (650 mg total) by mouth every 6 (six) hours as needed for mild pain, fever or headache. Patient taking differently: Take 650 mg by mouth as needed for mild pain, fever or headache. 07/22/19  Yes Roxan Hockey, MD  aspirin EC 81 MG EC tablet Take 1 tablet (81 mg total) by mouth daily with breakfast. 07/23/19  Yes Emokpae, Courage, MD  Cholecalciferol (VITAMIN D-3) 5000 UNITS TABS Take 5,000 Units by mouth every morning.   Yes [provider]  dexlansoprazole (DEXILANT) 60 MG capsule TAKE (1) CAPSULE BY MOUTH ONCE DAILY. Patient taking differently: Take 60 mg by mouth daily. 07/22/19  Yes Emokpae, Courage, MD  diltiazem (TIAZAC) 360 MG 24 hr capsule Take 360 mg by mouth daily. 07/16/19  Yes [provider]  furosemide (LASIX) 20 MG tablet Take 1 tablet (20 mg total) by mouth in the morning. 07/22/19 11/19/19 Yes Emokpae, Courage, MD  guaiFENesin (MUCINEX) 600 MG 12 hr tablet Take 1 tablet (600 mg total) by mouth 2 (two) times daily. Patient taking differently: Take 600 mg by mouth daily as needed for cough. 07/22/19  Yes Roxan Hockey, MD  hydrOXYzine (ATARAX/VISTARIL) 25 MG tablet Take 25 mg by mouth 3 (three) times daily as needed for itching. 05/11/20  Yes [provider]  levothyroxine (SYNTHROID) 137 MCG tablet Take 137 mcg by mouth daily before breakfast.   Yes [provider]  methotrexate 2.5 MG tablet Take 20 mg by mouth once a week. 06/16/20  Yes [provider]  ondansetron (ZOFRAN) 4 MG tablet Take 1 tablet (4 mg total) by mouth every 6 (six) hours as needed for nausea or vomiting.  06/06/20  Yes Sabriah Hobbins, Cristopher Estimable, MD  oxybutynin (DITROPAN) 5 MG tablet TAKE (1) TABLET BY MOUTH THREE TIMES DAILY Patient taking differently: Take 5 mg by mouth 3 (three) times daily. 01/27/20  Yes Florian Buff, MD  potassium chloride SA (KLOR-CON) 20 MEQ tablet Take 1 tablet (20 mEq total) by mouth every Tuesday, Thursday, Saturday, and Sunday. Patient taking differently: Take 20 mEq by mouth daily. 07/22/19  Yes Emokpae, Courage, MD  potassium chloride SA (KLOR-CON) 20 MEQ tablet Take 2 tablets (40 mEq total) by mouth 2 (two) times daily for 1 day. 07/12/20 07/13/20  Daneil Dolin, MD  pravastatin (PRAVACHOL) 40 MG tablet Take 40 mg by mouth every morning.  12/01/18  Yes [provider]  SYMBICORT 160-4.5 MCG/ACT inhaler Inhale 1 puff into the lungs daily as needed (shortness of breath or wheezing). 12/16/19  Yes [provider]  traZODone (DESYREL) 150 MG tablet Take 300 mg by mouth at bedtime.  10/01/17  Yes [provider]  triamterene-hydrochlorothiazide (MAXZIDE-25) 37.5-25 MG tablet Take 1 tablet by mouth daily. 06/16/20  Yes [provider]  albuterol (VENTOLIN HFA) 108 (90 Base) MCG/ACT inhaler Inhale 2 puffs into the lungs every 4 (four) hours as needed for wheezing or shortness of breath. Patient not taking: Reported on 07/05/2020 07/22/19   Roxan Hockey, MD  benzonatate (TESSALON) 200 MG capsule Take 1 capsule (200 mg total) by mouth 3 (three) times daily as needed for cough. Patient not taking: No sig reported 07/22/19   Roxan Hockey, MD  carvedilol (COREG) 6.25 MG tablet Take 1 tablet (6.25 mg total) by mouth 2 (two) times daily with a meal. 07/22/19 11/19/19  Roxan Hockey, MD  cetirizine (ZYRTEC) 10 MG tablet Take 1 tablet (10 mg total) by mouth every morning. Patient not taking: Reported on 07/05/2020 07/22/19   Roxan Hockey, MD  mometasone-formoterol (DULERA) 100-5 MCG/ACT AERO Inhale 2 puffs into the lungs 2 (two) times daily. Patient not taking:  No sig reported 07/22/19   Roxan Hockey, MD  montelukast (SINGULAIR) 10 MG tablet Take 1 tablet (10 mg total) by mouth at bedtime. Patient not taking: Reported on 07/05/2020 07/22/19   Roxan Hockey, MD  ondansetron (ZOFRAN ODT) 4 MG disintegrating tablet Take 1 tablet (4 mg total) by mouth every 8 (eight) hours as needed. Patient not taking: Reported on 07/05/2020 05/12/20   Margette Fast, MD    Allergies as of 06/29/2020 - Review Complete 06/06/2020  Allergen Reaction Noted  . Tetracyclines & related Other (See Comments) 08/21/2015  . Shellfish-derived products Nausea And Vomiting 07/15/2015    Family History  Problem Relation Age of Onset  . Other Mother        died age 28, natural causes  . Stroke Mother   . Diabetes Daughter   . Healthy Son   . Healthy Daughter   . Healthy Daughter   . Colon cancer Neg Hx   . Liver disease Neg Hx     Social  History   Socioeconomic History  . Marital status: Married    Spouse name: Not on file  . Number of children: 3  . Years of education: Not on file  . Highest education level: Not on file  Occupational History  . Not on file  Tobacco Use  . Smoking status: Current Every Day Smoker    Packs/day: 1.00    Years: 58.00    Pack years: 58.00    Types: Cigarettes  . Smokeless tobacco: Never Used  Vaping Use  . Vaping Use: Never used  Substance and Sexual Activity  . Alcohol use: No    Alcohol/week: 0.0 standard drinks  . Drug use: No  . Sexual activity: Not Currently  Other Topics Concern  . Not on file  Social History Narrative  . Not on file   Social Determinants of Health   Financial Resource Strain: Not on file  Food Insecurity: Not on file  Transportation Needs: Not on file  Physical Activity: Not on file  Stress: Not on file  Social Connections: Not on file  Intimate Partner Violence: Not on file    Review of Systems: See HPI, otherwise negative ROS  Physical Exam: BP (!) 140/56   Pulse 73   Temp 98.1 F  (36.7 C) (Oral)   Resp 13   SpO2 (P) 99%  General:   Alert,  Well-developed, well-nourished, pleasant and cooperative in NAD Neck:  Supple; no masses or thyromegaly. No significant cervical adenopathy. Lungs:  Clear throughout to auscultation.   No wheezes, crackles, or rhonchi. No acute distress. Heart:  Regular rate and rhythm; no murmurs, clicks, rubs,  or gallops. Abdomen: Non-distended, normal bowel sounds.  Soft and nontender without appreciable mass or hepatosplenomegaly.  Pulses:  Normal pulses noted. Extremities:  Without clubbing or edema.  Impression/Plan: 75 year old lady here for recurrent esophageal dysphagia history of Schatzki's ring.  And chronic diarrhea.  EGD with dilation and colonoscopy today per plan. The risks, benefits, limitations, imponderables and alternatives regarding both EGD and colonoscopy have been reviewed with the patient. Questions have been answered. All parties agreeable.      Notice: This dictation was prepared with Dragon dictation along with smaller phrase technology. Any transcriptional errors that result from this process are unintentional and may not be corrected upon review.

## 2020-07-13 NOTE — Transfer of Care (Signed)
Immediate Anesthesia Transfer of Care Note  Patient: Connie Ponce  Procedure(s) Performed: COLONOSCOPY WITH PROPOFOL (N/A ) ESOPHAGOGASTRODUODENOSCOPY (EGD) WITH PROPOFOL (N/A ) MALONEY DILATION (N/A ) BIOPSY  Patient Location: Short Stay  Anesthesia Type:General  Level of Consciousness: awake, alert  and oriented  Airway & Oxygen Therapy: Patient Spontanous Breathing  Post-op Assessment: Report given to RN and Post -op Vital signs reviewed and stable  Post vital signs: Reviewed and stable  Last Vitals:  Vitals Value Taken Time  BP    Temp    Pulse    Resp    SpO2      Last Pain:  Vitals:   07/13/20 1414  TempSrc:   PainSc: 0-No pain      Patients Stated Pain Goal: 5 (07/13/20 1114)  Complications: No complications documented.

## 2020-07-13 NOTE — Anesthesia Postprocedure Evaluation (Signed)
Anesthesia Post Note  Patient: Connie Ponce  Procedure(s) Performed: COLONOSCOPY WITH PROPOFOL (N/A ) ESOPHAGOGASTRODUODENOSCOPY (EGD) WITH PROPOFOL (N/A ) MALONEY DILATION (N/A ) BIOPSY  Patient location during evaluation: Phase II Anesthesia Type: General Level of consciousness: awake and alert and oriented Pain management: pain level controlled Vital Signs Assessment: post-procedure vital signs reviewed and stable Respiratory status: spontaneous breathing and respiratory function stable Cardiovascular status: stable and blood pressure returned to baseline Postop Assessment: no apparent nausea or vomiting Anesthetic complications: no   No complications documented.   Last Vitals:  Vitals:   07/13/20 1131 07/13/20 1454  BP:  (!) 138/58  Pulse:  85  Resp:    Temp:  36.6 C  SpO2: (P) 99% 100%    Last Pain:  Vitals:   07/13/20 1454  TempSrc: Oral  PainSc: 0-No pain                 Rhylin Venters C Freada [place]

## 2020-07-13 NOTE — Op Note (Signed)
[facility] Patient Name: Connie Ponce Procedure Date: 07/13/2020 2:29 PM MRN: [id number] Date of Birth: 1946-01-21 Attending MD: Gennette Pac , MD CSN: [id number] Age: 75 Admit Type: Outpatient Procedure:                Colonoscopy Indications:              Chronic diarrhea, Abnormal CT of the GI tract Providers:                Gennette Pac, MD, Crystal Page, [facility], Technician Referring MD:              Medicines:                Propofol per Anesthesia Complications:            No immediate complications. Estimated Blood Loss:     Estimated blood loss: none. Procedure:                Pre-Anesthesia Assessment:                           - Prior to the procedure, a History and Physical                            was performed, and patient medications and                            allergies were reviewed. The patient's tolerance of                            previous anesthesia was also reviewed. The risks                            and benefits of the procedure and the sedation                            options and risks were discussed with the patient.                            All questions were answered, and informed consent                            was obtained. Prior Anticoagulants: The patient has                            taken no previous anticoagulant or antiplatelet                            agents. ASA Grade Assessment: III - A patient with                            severe systemic disease. After reviewing the risks  and benefits, the patient was deemed in                            satisfactory condition to undergo the procedure.                           After obtaining informed consent, the colonoscope                            was passed under direct vision. Throughout the                            procedure, the patient's blood pressure, pulse, and                             oxygen saturations were monitored continuously. The                            [id number]-[id number] ([id number]) scope was introduced through                            the anus and advanced to the the cecum, identified                            by appendiceal orifice and ileocecal valve. Scope In: 2:32:48 PM Scope Out: 2:43:52 PM Scope Withdrawal Time: 0 hours 8 minutes 25 seconds  Total Procedure Duration: 0 hours 11 minutes 4 seconds  Findings:      The perianal and digital rectal examinations were normal. Distal 10 cm       of TI appeared normal.      The colon (entire examined portion) appeared normal. Segmental biopsies       left and right colon taken for histologic study      The retroflexed view of the distal rectum and anal verge was normal and       showed no anal or rectal abnormalities. Specifically, no evidence of       fistula. Impression:               - The entire examined colon is normal. Normal                            terminal ileum. Status post segmental biopsy.                           - The distal rectum and anal verge are normal on                            retroflexion view.                           - Moderate Sedation:      Moderate (conscious) sedation was personally administered by an       anesthesia professional. The following parameters were monitored: oxygen       saturation, heart rate, blood pressure, respiratory rate, EKG, adequacy       of pulmonary  ventilation, and response to care. Recommendation:           - Patient has a contact number available for                            emergencies. The signs and symptoms of potential                            delayed complications were discussed with the                            patient. Return to normal activities tomorrow.                            Written discharge instructions were provided to the                            patient.                           - Advance diet as tolerated.                            - Continue present medications.                           - No repeat colonoscopy due to age.                           - Return to GI clinic in 3 months. Procedure Code(s):        --- Professional ---                           [phone], Colonoscopy, flexible; diagnostic, including                            collection of specimen(s) by brushing or washing,                            when performed (separate procedure) Diagnosis Code(s):        --- Professional ---                           K52.9, Noninfective gastroenteritis and colitis,                            unspecified                           R93.3, Abnormal findings on diagnostic imaging of                            other parts of digestive tract CPT copyright 2019 American [employer]. All rights reserved. The codes documented in this report are preliminary and upon coder review may  be revised to meet current compliance requirements. Gerrit Friends. Jena Gauss, MD Gennette Pac, MD 07/13/2020 3:02:28 PM  This report has been signed electronically. Number of Addenda: 0

## 2020-07-17 ENCOUNTER — Encounter (HOSPITAL_COMMUNITY): Payer: Self-pay | Admitting: Internal Medicine

## 2020-07-17 LAB — SURGICAL PATHOLOGY

## 2020-07-19 ENCOUNTER — Encounter: Payer: Self-pay | Admitting: Internal Medicine

## 2020-07-19 NOTE — Op Note (Signed)
[facility] Patient Name: Connie Ponce Procedure Date: 07/13/2020 1:19 PM MRN: [id number] Date of Birth: [birth year] Attending MD: Gennette Pac , MD CSN: [id number] Age: 75 Admit Type: Outpatient Procedure:                Upper GI endoscopy Indications:              Dysphagia; abnormal imaging Providers:                Gennette Pac, MD, Crystal Page, Kristine L.                            Jessee Avers, Technician Referring MD:              Medicines:                Propofol per Anesthesia Complications:            No immediate complications. Estimated Blood Loss:     Estimated blood loss was minimal. Procedure:                Pre-Anesthesia Assessment:                           - Prior to the procedure, a History and Physical                            was performed, and patient medications and                            allergies were reviewed. The patient's tolerance of                            previous anesthesia was also reviewed. The risks                            and benefits of the procedure and the sedation                            options and risks were discussed with the patient.                            All questions were answered, and informed consent                            was obtained. Prior Anticoagulants: The patient has                            taken no previous anticoagulant or antiplatelet                            agents. ASA Grade Assessment: III - A patient with                            severe systemic disease. After reviewing the risks  and benefits, the patient was deemed in                            satisfactory condition to undergo the procedure.                           After obtaining informed consent, the endoscope was                            passed under direct vision. Throughout the                            procedure, the patient's blood pressure, pulse, and                             oxygen saturations were monitored continuously. The                            [id number]-[id number] ([id number] scope was introduced through the                            mouth, and advanced to the second part of duodenum.                            The upper GI endoscopy was accomplished without                            difficulty. The patient tolerated the procedure                            well. Scope In: 2:18:37 PM Scope Out: 2:26:33 PM Total Procedure Duration: 0 hours 7 minutes 56 seconds  Findings:      The examined esophagus was normal.      A small hiatal hernia was present.      The exam was otherwise without abnormality.      The duodenal bulb and second portion of the duodenum were normal. The       scope was withdrawn. Dilation was performed with a Maloney dilator with       mild resistance at 54 Fr. Dilation was performed with a Maloney dilator       with mild resistance at 56 Fr. The dilation site was examined following       endoscope reinsertion and showed no change. Estimated blood loss was       minimal. Patient did have multiple superficial tears in the esophagus       following dilation. Subsequently I biopsied the mid and distal esophagus       for histologic study Impression:               - Normal esophagus. Dilated/ biopsied                           - Small hiatal hernia.                           - The examination was otherwise normal.                           -  Normal duodenal bulb and second portion of the                            duodenum. Moderate Sedation:      Moderate (conscious) sedation was personally administered by an       anesthesia professional. The following parameters were monitored: oxygen       saturation, heart rate, blood pressure, respiratory rate, EKG, adequacy       of pulmonary ventilation, and response to care. Recommendation:           - Patient has a contact number available for                            emergencies. The signs and  symptoms of potential                            delayed complications were discussed with the                            patient. Return to normal activities tomorrow.                            Written discharge instructions were provided to the                            patient.                           - Advance diet as tolerated. Continue present                            medication. Office visit with [place] in 3 months.                            Follow-up on pathology. See colonoscopy report. Procedure Code(s):        --- Professional ---                           [phone], Esophagogastroduodenoscopy, flexible,                            transoral; diagnostic, including collection of                            specimen(s) by brushing or washing, when performed                            (separate procedure)                           43450, Dilation of esophagus, by unguided sound or                            bougie, single or multiple passes Diagnosis Code(s):        --- Professional ---  K44.9, Diaphragmatic hernia without obstruction or                            gangrene                           R13.10, Dysphagia, unspecified CPT copyright 2019 American Medical Association. All rights reserved. The codes documented in this report are preliminary and upon coder review may  be revised to meet current compliance requirements. Gerrit Friends. Matilda Fleig, MD Gennette Pac, MD 07/19/2020 3:28:49 PM This report has been signed electronically. Number of Addenda: 0

## 2020-08-31 ENCOUNTER — Encounter: Payer: Self-pay | Admitting: Internal Medicine

## 2020-09-07 ENCOUNTER — Other Ambulatory Visit (HOSPITAL_COMMUNITY): Payer: 59

## 2020-10-09 ENCOUNTER — Ambulatory Visit: Payer: 59 | Admitting: Gastroenterology

## 2020-10-17 ENCOUNTER — Other Ambulatory Visit: Payer: Self-pay

## 2020-10-17 ENCOUNTER — Emergency Department (HOSPITAL_COMMUNITY)
Admission: EM | Admit: 2020-10-17 | Discharge: 2020-10-17 | Disposition: A | Discharge status: Home / Self Care | Payer: 59 | Attending: Emergency Medicine | Admitting: Emergency Medicine

## 2020-10-17 ENCOUNTER — Encounter (HOSPITAL_COMMUNITY): Payer: Self-pay | Admitting: Emergency Medicine

## 2020-10-17 ENCOUNTER — Emergency Department (HOSPITAL_COMMUNITY): Payer: 59

## 2020-10-17 DIAGNOSIS — W19XXXA Unspecified fall, initial encounter: Secondary | ICD-10-CM

## 2020-10-17 DIAGNOSIS — I130 Hypertensive heart and chronic kidney disease with heart failure and stage 1 through stage 4 chronic kidney disease, or unspecified chronic kidney disease: Secondary | ICD-10-CM | POA: Diagnosis not present

## 2020-10-17 DIAGNOSIS — S20211A Contusion of right front wall of thorax, initial encounter: Secondary | ICD-10-CM | POA: Insufficient documentation

## 2020-10-17 DIAGNOSIS — N183 Chronic kidney disease, stage 3 unspecified: Secondary | ICD-10-CM | POA: Diagnosis not present

## 2020-10-17 DIAGNOSIS — I2510 Atherosclerotic heart disease of native coronary artery without angina pectoris: Secondary | ICD-10-CM | POA: Diagnosis not present

## 2020-10-17 DIAGNOSIS — S0003XA Contusion of scalp, initial encounter: Secondary | ICD-10-CM | POA: Insufficient documentation

## 2020-10-17 DIAGNOSIS — W182XXA Fall in (into) shower or empty bathtub, initial encounter: Secondary | ICD-10-CM | POA: Diagnosis not present

## 2020-10-17 DIAGNOSIS — Y92009 Unspecified place in unspecified non-institutional (private) residence as the place of occurrence of the external cause: Secondary | ICD-10-CM | POA: Insufficient documentation

## 2020-10-17 DIAGNOSIS — S8001XA Contusion of right knee, initial encounter: Secondary | ICD-10-CM | POA: Insufficient documentation

## 2020-10-17 DIAGNOSIS — S0990XA Unspecified injury of head, initial encounter: Secondary | ICD-10-CM | POA: Diagnosis present

## 2020-10-17 DIAGNOSIS — I5032 Chronic diastolic (congestive) heart failure: Secondary | ICD-10-CM | POA: Insufficient documentation

## 2020-10-17 DIAGNOSIS — J441 Chronic obstructive pulmonary disease with (acute) exacerbation: Secondary | ICD-10-CM | POA: Insufficient documentation

## 2020-10-17 DIAGNOSIS — E039 Hypothyroidism, unspecified: Secondary | ICD-10-CM | POA: Insufficient documentation

## 2020-10-17 DIAGNOSIS — F17210 Nicotine dependence, cigarettes, uncomplicated: Secondary | ICD-10-CM | POA: Diagnosis not present

## 2020-10-17 LAB — CBC WITH DIFFERENTIAL/PLATELET
Abs Immature Granulocytes: 0.03 10*3/uL (ref 0.00–0.07)
Basophils Absolute: 0.1 10*3/uL (ref 0.0–0.1)
Basophils Relative: 2 %
Eosinophils Absolute: 0.4 10*3/uL (ref 0.0–0.5)
Eosinophils Relative: 8 %
HCT: 35.2 % — ABNORMAL LOW (ref 36.0–46.0)
Hemoglobin: 11.4 g/dL — ABNORMAL LOW (ref 12.0–15.0)
Immature Granulocytes: 1 %
Lymphocytes Relative: 21 %
Lymphs Abs: 1.1 10*3/uL (ref 0.7–4.0)
MCH: 31.8 pg (ref 26.0–34.0)
MCHC: 32.4 g/dL (ref 30.0–36.0)
MCV: 98.1 fL (ref 80.0–100.0)
Monocytes Absolute: 0.5 10*3/uL (ref 0.1–1.0)
Monocytes Relative: 10 %
Neutro Abs: 3 10*3/uL (ref 1.7–7.7)
Neutrophils Relative %: 58 %
Platelets: 140 10*3/uL — ABNORMAL LOW (ref 150–400)
RBC: 3.59 MIL/uL — ABNORMAL LOW (ref 3.87–5.11)
RDW: 12.3 % (ref 11.5–15.5)
WBC: 5.1 10*3/uL (ref 4.0–10.5)
nRBC: 0 % (ref 0.0–0.2)

## 2020-10-17 LAB — COMPREHENSIVE METABOLIC PANEL
ALT: 12 U/L (ref 0–44)
AST: 16 U/L (ref 15–41)
Albumin: 3.3 g/dL — ABNORMAL LOW (ref 3.5–5.0)
Alkaline Phosphatase: 73 U/L (ref 38–126)
Anion gap: 6 (ref 5–15)
BUN: 21 mg/dL (ref 8–23)
CO2: 27 mmol/L (ref 22–32)
Calcium: 9.5 mg/dL (ref 8.9–10.3)
Chloride: 102 mmol/L (ref 98–111)
Creatinine, Ser: 0.91 mg/dL (ref 0.44–1.00)
GFR, Estimated: >60 mL/min (ref >60)
Glucose, Bld: 135 mg/dL — ABNORMAL HIGH (ref 70–99)
Potassium: 3.9 mmol/L (ref 3.5–5.1)
Sodium: 135 mmol/L (ref 135–145)
Total Bilirubin: 0.3 mg/dL (ref 0.3–1.2)
Total Protein: 6.2 g/dL — ABNORMAL LOW (ref 6.5–8.1)

## 2020-10-17 MED ORDER — OXYCODONE-ACETAMINOPHEN 5-325 MG PO TABS
1.0000 | ORAL_TABLET | Freq: Once | ORAL | Status: AC
  Administered 2020-10-17: 1 via ORAL
  Filled 2020-10-17: qty 1

## 2020-10-17 MED ORDER — LIDOCAINE 5 % EX PTCH
1.0000 | MEDICATED_PATCH | CUTANEOUS | Status: DC
  Administered 2020-10-17: 1 via TRANSDERMAL
  Filled 2020-10-17: qty 1

## 2020-10-17 MED ORDER — LIDOCAINE 4 % EX PTCH: 1.0000 | MEDICATED_PATCH | Freq: Every day | CUTANEOUS | 0 refills | Status: AC | PRN

## 2020-10-17 NOTE — ED Notes (Signed)
Assisted pt to BR and back to bed, pt ambulated with her cane from home without difficultly.

## 2020-10-17 NOTE — ED Triage Notes (Signed)
2-3+ edema to feet bilaterally.  Denies LOC.

## 2020-10-17 NOTE — ED Provider Notes (Signed)
[facility] EMERGENCY DEPARTMENT Provider Note   CSN: [id number] Arrival date & time: 10/17/20  3818     History Chief Complaint  Patient presents with   [place] last night out of the bath tub backwards.  Hit right side of head, right ribs and right knee.  Pt is only on ASA.      Connie Ponce is a 75 y.o. female.  Pt presents to the ED today with a fall.  Pt said she was getting into the bathtub and fell.  She said her legs were in the bathtub and her right chest hit the commode.  She did hit her head, but did not have a loc.  Her daughter was there and helped her get up.  She rested for a while and then was able to take her bath and walk.  She has pain in her head/neck, right ribs, and right knee.  She does have mild sob and has some swelling in her legs.  No f/c.       Past Medical History:  Diagnosis Date   Anxiety    Carpal tunnel syndrome    COPD (chronic obstructive pulmonary disease) ([place])    Coronary artery disease    Gastric ulcer    GERD (gastroesophageal reflux disease)    Hyperglycemia    Hyperlipidemia    Hypertension    Hypothyroidism    Osteoarthritis    Peptic ulcer disease    Pneumonia 2010   Renal insufficiency    Spinal stenosis 2010   lumbar   Stroke ([place]) 1990s   mild   TIA (transient ischemic attack)     Patient Active Problem List   Diagnosis Date Noted   Clostridium difficile infection 01/29/2020   Hypokalemia 01/29/2020   COPD with acute exacerbation ([place]) 07/21/2019   Chronic heart failure with preserved ejection fraction (HFpEF) /diastolic dysfunction/EF 29% 07/21/2019   Nicotine dependence 07/21/2019   Microcytic anemia 09/23/2018   Acute gout of left wrist 09/01/2018   Bacteremia 08/30/2018   Hypothyroid 08/30/2018   Itching 08/30/2018   Rash 08/30/2018   Spinal stenosis of lumbar region 08/17/2018   AKI (acute kidney injury) ([place])    Syncope and collapse 03/28/2018   COPD (chronic obstructive pulmonary disease) ([place])  03/28/2018   Tobacco abuse 03/28/2018   Fall at home 03/28/2018   CKD (chronic kidney disease) stage 3, GFR 30-59 ml/min 03/28/2018   Anemia in chronic kidney disease (CKD) 03/28/2018   Hyperlipidemia 93/71/6967   Acute metabolic encephalopathy 89/38/1017   Gait instability 10/03/2017   Dysarthria 10/03/2017   Essential hypertension 10/03/2017   Slurred speech    Abdominal mass, RUQ (right upper quadrant) 03/13/2017   Abdominal pain 03/13/2017   RUQ pain 03/13/2017   Dysphagia    TIA (transient ischemic attack) 01/03/2016   Absolute anemia 07/03/2015   Chronic cystitis 04/21/2015   Incomplete emptying of bladder 04/21/2015   Increased frequency of urination 04/21/2015   SUI (stress urinary incontinence, female) 04/21/2015   Urge incontinence 04/21/2015   Proctalgia    Rectal pain 01/04/2015   Hemorrhoids 01/04/2015   Diarrhea 01/04/2015   Gastric ulceration    Gastric ulcer 51/04/5850   Toxic metabolic encephalopathy 77/82/4235   UTI (urinary tract infection) 09/19/2014   ARF (acute renal failure) ([place])    LUQ pain 07/28/2014   Nausea with vomiting 07/28/2014   Bilateral lower extremity edema 07/28/2014   RLQ abdominal pain 01/22/2012   Constipation 01/22/2012   GERD (gastroesophageal  reflux disease) 01/22/2012    Past Surgical History:  Procedure Laterality Date   ABDOMINAL HYSTERECTOMY     APPENDECTOMY     BACK SURGERY     BIOPSY  07/13/2020   Procedure: BIOPSY;  Surgeon: Daneil Dolin, MD;  Location: [facility];  Service: Endoscopy;;   BONE MARROW ASPIRATION Left 04/22/14   BONE MARROW BIOPSY Left 04/22/14   CHOLECYSTECTOMY     COLONOSCOPY     ?2005, [place]   COLONOSCOPY N/A 04/07/2015   ZOX:WRUEAV   COLONOSCOPY WITH ESOPHAGOGASTRODUODENOSCOPY (EGD)  02/11/2012   RMR: Ulcerative/erosive reflux esophagitis, benign appearing gastric ulcer with unremarkable biopsy, no H pylori. Colonoscopy was unremarkable. Next colonoscopy in 2023   COLONOSCOPY WITH  PROPOFOL N/A 07/13/2020   Procedure: COLONOSCOPY WITH PROPOFOL;  Surgeon: Daneil Dolin, MD;  Location: [facility];  Service: Endoscopy;  Laterality: N/A;  7:30am   CYSTECTOMY     cyst removed from rectal area.    ESOPHAGOGASTRODUODENOSCOPY  01/05/2003   RMR: Normal esophagus, small hiatal hernia/ A couple of tiny antral erosions, otherwise normal stomach normal D1 and D2.    ESOPHAGOGASTRODUODENOSCOPY N/A 08/11/2014   WUJ:WJXBJYN ulcer/HH   ESOPHAGOGASTRODUODENOSCOPY N/A 12/01/2014   WGN:FAOZHYQMVHQIO improved gastric ulceration s/p bx   ESOPHAGOGASTRODUODENOSCOPY N/A 01/31/2016   Procedure: ESOPHAGOGASTRODUODENOSCOPY (EGD);  Surgeon: Daneil Dolin, MD;  [facility];  Service: Endoscopy;  Laterality: N/A;  745   ESOPHAGOGASTRODUODENOSCOPY (EGD) WITH PROPOFOL N/A 07/13/2020   Procedure: ESOPHAGOGASTRODUODENOSCOPY (EGD) WITH PROPOFOL;  Surgeon: Daneil Dolin, MD;  [facility];  Service: Endoscopy;  Laterality: N/A;   HEMORRHOID SURGERY     JOINT REPLACEMENT Right    knee   MALONEY DILATION N/A 01/31/2016   Procedure: Venia Minks DILATION;  Surgeon: Daneil Dolin, MD;  Location: [facility];  Service: Endoscopy;  Laterality: N/A;   MALONEY DILATION N/A 07/13/2020   Procedure: Venia Minks DILATION;  Surgeon: Daneil Dolin, MD;  Location: [facility];  Service: Endoscopy;  Laterality: N/A;     OB History     Gravida  4   Para      Term      Preterm      AB      Living  3      SAB      IAB      Ectopic      Multiple      Live Births              Family History  Problem Relation Age of Onset   Other Mother        died age 23, natural causes   Stroke Mother    Diabetes Daughter    Healthy Son    Healthy Daughter    Healthy Daughter    Colon cancer Neg Hx    Liver disease Neg Hx     Social History   Tobacco Use   Smoking status: Every Day    Packs/day: 1.00    Years: 58.00    Pack years: 58.00    Types: Cigarettes    Smokeless tobacco: Never  Vaping Use   Vaping Use: Never used  Substance Use Topics   Alcohol use: No    Alcohol/week: 0.0 standard drinks   Drug use: No    Home Medications Prior to Admission medications   Medication Sig Start Date End Date Taking? Authorizing Provider  Lidocaine 4 % PTCH Apply 1 patch topically daily as  needed (pain). 10/17/20  Yes Isla Pence, MD    Allergies    Tetracyclines & related and Shellfish-derived products  Review of Systems   Review of Systems  Cardiovascular:  Positive for leg swelling.  Musculoskeletal:  Positive for neck pain.       Right rib and right knee pain  Neurological:  Positive for headaches.  All other systems reviewed and are negative.  Physical Exam Updated Vital Signs BP 140/67   Pulse 76   Temp 98.1 F (36.7 C) (Oral)   Resp (!) 22   Ht $R'5\' 3"'Hp$  (1.6 m)   Wt 68 kg   SpO2 94%   BMI 26.57 kg/m   Physical Exam Vitals and nursing note reviewed.  Constitutional:      Appearance: Normal appearance.  HENT:     Head: Normocephalic and atraumatic.     Right Ear: External ear normal.     Left Ear: External ear normal.     Nose: Nose normal.     Mouth/Throat:     Mouth: Mucous membranes are moist.     Pharynx: Oropharynx is clear.  Eyes:     Extraocular Movements: Extraocular movements intact.     Conjunctiva/sclera: Conjunctivae normal.     Pupils: Pupils are equal, round, and reactive to light.  Neck:   Cardiovascular:     Rate and Rhythm: Normal rate and regular rhythm.     Pulses: Normal pulses.     Heart sounds: Normal heart sounds.  Pulmonary:     Effort: Pulmonary effort is normal.     Breath sounds: Normal breath sounds.  Chest:     Chest wall: Tenderness present.    Abdominal:     General: Abdomen is flat. Bowel sounds are normal.     Palpations: Abdomen is soft.  Musculoskeletal:        General: Normal range of motion.     Cervical back: Normal range of motion and neck supple.     Right lower leg:  Edema present.     Left lower leg: Edema present.       Legs:  Skin:    General: Skin is warm.     Capillary Refill: Capillary refill takes less than 2 seconds.  Neurological:     General: No focal deficit present.     Mental Status: She is alert and oriented to person, place, and time.  Psychiatric:        Mood and Affect: Mood normal.        Behavior: Behavior normal.        Thought Content: Thought content normal.        Judgment: Judgment normal.    ED Results / Procedures / Treatments   Labs (all labs ordered are listed, but only abnormal results are displayed) Labs Reviewed  COMPREHENSIVE METABOLIC PANEL - Abnormal; Notable for the following components:      Result Value   Glucose, Bld 135 (*)    Total Protein 6.2 (*)    Albumin 3.3 (*)    All other components within normal limits  CBC WITH DIFFERENTIAL/PLATELET - Abnormal; Notable for the following components:   RBC 3.59 (*)    Hemoglobin 11.4 (*)    HCT 35.2 (*)    Platelets 140 (*)    All other components within normal limits  URINALYSIS, ROUTINE W REFLEX MICROSCOPIC    EKG None  Radiology DG Ribs Unilateral W/Chest Right  Result Date: 10/17/2020 CLINICAL DATA:  Fall. EXAM: RIGHT  RIBS AND CHEST - 3+ VIEW COMPARISON:  Chest x-ray 05/12/2020. FINDINGS: No acute bony or joint abnormality. No evidence of fracture. Degenerative change thoracic spine and right shoulder. No evidence of pneumothorax. IMPRESSION: No acute abnormality. Electronically Signed   By: Marcello Moores  Register   On: 10/17/2020 09:40   DG Pelvis 1-2 Views  Result Date: 10/17/2020 CLINICAL DATA:  Fall EXAM: PELVIS - 1-2 VIEW COMPARISON:  None. FINDINGS: Mild symmetric degenerative changes in the hips with joint space narrowing and spurring. SI joints symmetric and unremarkable. No acute bony abnormality. Specifically, no fracture, subluxation, or dislocation. IMPRESSION: Degenerative changes in the hips.  No acute bony abnormality. Electronically Signed    By: Rolm Baptise M.D.   On: 10/17/2020 09:42   CT HEAD WO CONTRAST (5MM)  Result Date: 10/17/2020 CLINICAL DATA:  Fall, hit back of head EXAM: CT HEAD WITHOUT CONTRAST TECHNIQUE: Contiguous axial images were obtained from the base of the skull through the vertex without intravenous contrast. COMPARISON:  12/08/2018 FINDINGS: Brain: Old left basal ganglia lacunar infarct. No acute intracranial abnormality. Specifically, no hemorrhage, hydrocephalus, mass lesion, acute infarction, or significant intracranial injury. Vascular: No hyperdense vessel or unexpected calcification. Skull: No acute calvarial abnormality. Sinuses/Orbits: No acute findings Other: None IMPRESSION: No acute intracranial abnormality. Electronically Signed   By: Rolm Baptise M.D.   On: 10/17/2020 09:58   CT Cervical Spine Wo Contrast  Result Date: 10/17/2020 CLINICAL DATA:  Fall, hit back of head EXAM: CT CERVICAL SPINE WITHOUT CONTRAST TECHNIQUE: Multidetector CT imaging of the cervical spine was performed without intravenous contrast. Multiplanar CT image reconstructions were also generated. COMPARISON:  03/28/2018 FINDINGS: Alignment: Normal Skull base and vertebrae: No acute fracture. No primary bone lesion or focal pathologic process. Soft tissues and spinal canal: No prevertebral fluid or swelling. No visible canal hematoma. Disc levels: Advanced diffuse degenerative facet disease bilaterally. Large anterior flowing osteophytes throughout the cervical spine. Upper chest: No acute findings Other: None IMPRESSION: No acute bony abnormality. Diffuse degenerative disc and facet disease. Electronically Signed   By: Rolm Baptise M.D.   On: 10/17/2020 09:57   DG Knee Complete 4 Views Right  Result Date: 10/17/2020 CLINICAL DATA:  Fall, right knee pain EXAM: RIGHT KNEE - COMPLETE 4+ VIEW COMPARISON:  None. FINDINGS: Prior right knee replacement. No hardware complicating feature. No acute bony abnormality. Specifically, no fracture,  subluxation, or dislocation. No joint effusion. IMPRESSION: Right knee replacement.  No visible complicating feature. Electronically Signed   By: Rolm Baptise M.D.   On: 10/17/2020 09:43    Procedures Procedures   Medications Ordered in ED Medications  lidocaine (LIDODERM) 5 % 1 patch (has no administration in time range)  oxyCODONE-acetaminophen (PERCOCET/ROXICET) 5-325 MG per tablet 1 tablet (1 tablet Oral Given 10/17/20 1110)    ED Course  I have reviewed the triage vital signs and the nursing notes.  Pertinent labs & imaging results that were available during my care of the patient were reviewed by me and considered in my medical decision making (see chart for details).    MDM Rules/Calculators/A&P                           Pt is feeling better after her normal percocet pain meds.  She is able to ambulate here.   Pt has percocet at home.  She is stable for d/c.  Return if worse.  Final Clinical Impression(s) / ED Diagnoses Final diagnoses:  Contusion of  right chest wall, initial encounter  Contusion of right knee, initial encounter  Fall in home, initial encounter  Contusion of scalp, initial encounter    Rx / DC Orders ED Discharge Orders          Ordered    Lidocaine 4 % PTCH  Daily PRN        10/17/20 1158             Isla Pence, MD 10/17/20 1201

## 2020-11-16 ENCOUNTER — Encounter: Payer: Self-pay | Admitting: Orthopaedic Surgery

## 2020-11-16 ENCOUNTER — Other Ambulatory Visit: Payer: Self-pay

## 2020-11-16 ENCOUNTER — Ambulatory Visit (INDEPENDENT_AMBULATORY_CARE_PROVIDER_SITE_OTHER): Payer: 59 | Admitting: Orthopaedic Surgery

## 2020-11-16 VITALS — BP 105/55 | HR 78 | Ht 63.0 in | Wt 150.0 lb

## 2020-11-16 DIAGNOSIS — R0602 Shortness of breath: Secondary | ICD-10-CM | POA: Insufficient documentation

## 2020-11-16 DIAGNOSIS — E119 Type 2 diabetes mellitus without complications: Secondary | ICD-10-CM | POA: Insufficient documentation

## 2020-11-16 DIAGNOSIS — I6523 Occlusion and stenosis of bilateral carotid arteries: Secondary | ICD-10-CM | POA: Insufficient documentation

## 2020-11-16 DIAGNOSIS — J309 Allergic rhinitis, unspecified: Secondary | ICD-10-CM | POA: Insufficient documentation

## 2020-11-16 DIAGNOSIS — G8929 Other chronic pain: Secondary | ICD-10-CM | POA: Insufficient documentation

## 2020-11-16 DIAGNOSIS — M25531 Pain in right wrist: Secondary | ICD-10-CM | POA: Diagnosis not present

## 2020-11-16 DIAGNOSIS — R296 Repeated falls: Secondary | ICD-10-CM | POA: Insufficient documentation

## 2020-11-16 DIAGNOSIS — M10031 Idiopathic gout, right wrist: Secondary | ICD-10-CM

## 2020-11-16 MED ORDER — PREDNISONE 10 MG (21) PO TBPK: ORAL_TABLET | ORAL | 1 refills | Status: DC

## 2020-11-16 NOTE — Progress Notes (Signed)
My wrist hurts bad.  She fell at home in bathroom on 10-17-20.  She was seen and evaluated in the ER and had multiple X-rays done which were negative.  She has had swelling and pain of the right wrist over the last two weeks which is getting worse.  It hurts to even touch the wrist or move it.  She has swelling of the fingers.  She is in marked pain at times.  She has no other joint pain.  She has no neurological changes.  I have reviewed the ER notes.  I have reviewed the notes from [place].  I have reviewed the X-rays from the ER and [place] of the wrist.  I have independently reviewed and interpreted x-rays of this patient done at another site by another physician or qualified health professional.  She is taking oxycodone regularly for other problems.  She has marked swelling of the right wrist extending distally to the fingers.  Any type of motion is very painful.  Just waving a folder over the wrist and the "wind" causes pain.  She has no redness.  She has fusiform swelling of the fingers.  Just barely touching the skin hurts.  Other joints are negative.  Encounter Diagnoses  Name Primary?   Right wrist pain Yes   Acute idiopathic gout of right wrist    I am concerned about possible gout.  She says her mother had it but she is not sure.  I will get serum uric acid.  I will give cock-up splint.  She is to continue her pain medicine.  I will give prednisone dose pack.  Return Tuesday.  Call if any problem.  Precautions discussed.  Electronically Signed Darreld Mclean, MD 9/1/202211:26 AM

## 2020-11-17 LAB — URIC ACID: Uric Acid, Serum: 10.8 mg/dL — ABNORMAL HIGH (ref 2.5–7.0)

## 2020-11-21 ENCOUNTER — Other Ambulatory Visit: Payer: Self-pay

## 2020-11-21 ENCOUNTER — Encounter: Payer: Self-pay | Admitting: Orthopaedic Surgery

## 2020-11-21 ENCOUNTER — Ambulatory Visit (INDEPENDENT_AMBULATORY_CARE_PROVIDER_SITE_OTHER): Payer: 59 | Admitting: Orthopaedic Surgery

## 2020-11-21 VITALS — BP 146/71 | HR 77 | Ht 63.0 in | Wt 150.0 lb

## 2020-11-21 DIAGNOSIS — M25531 Pain in right wrist: Secondary | ICD-10-CM

## 2020-11-21 DIAGNOSIS — M10031 Idiopathic gout, right wrist: Secondary | ICD-10-CM | POA: Diagnosis not present

## 2020-11-21 MED ORDER — ALLOPURINOL 300 MG PO TABS: 300.0000 mg | ORAL_TABLET | Freq: Every day | ORAL | 5 refills | Status: AC

## 2020-11-21 NOTE — Progress Notes (Signed)
I am better.  Her serum uric acid is 10.7, normal up to 7.0.  I have explained that she has gout.  I have explained it is hereditary.  I have explained need to take the allopurinol regularly, daily.  Her right wrist is much improved on the prednisone.  She has no swelling today and full motion.  NV intact.  She has no redness.  Encounter Diagnoses  Name Primary?   Acute idiopathic gout of right wrist Yes   Right wrist pain    I will call in allopurinol.  Return in one month.  Call if any problem.  Precautions discussed.  Electronically Signed Darreld Mclean, MD 9/6/202210:31 AM

## 2020-12-16 DEATH — deceased

## 2020-12-21 ENCOUNTER — Ambulatory Visit: Payer: 59 | Admitting: Orthopaedic Surgery

## 2021-01-04 ENCOUNTER — Ambulatory Visit: Payer: 59 | Admitting: Gastroenterology

## 2021-01-05 ENCOUNTER — Ambulatory Visit: Payer: 59 | Admitting: Gastroenterology

## 2023-10-14 NOTE — Telephone Encounter (Signed)
 SABRA
